# Patient Record
Sex: Male | Born: 1935 | Race: White | Hispanic: No | Marital: Married | Smoking: Former smoker
Health system: Southern US, Community
[De-identification: ages and names within clinical notes are randomized; demographics above are authoritative.]

## PROBLEM LIST (undated history)

## (undated) DIAGNOSIS — E785 Hyperlipidemia, unspecified: Secondary | ICD-10-CM

## (undated) DIAGNOSIS — G4733 Obstructive sleep apnea (adult) (pediatric): Secondary | ICD-10-CM

## (undated) DIAGNOSIS — I10 Essential (primary) hypertension: Secondary | ICD-10-CM

## (undated) DIAGNOSIS — F32A Depression, unspecified: Secondary | ICD-10-CM

## (undated) DIAGNOSIS — I471 Supraventricular tachycardia: Secondary | ICD-10-CM

## (undated) DIAGNOSIS — K5790 Diverticulosis of intestine, part unspecified, without perforation or abscess without bleeding: Secondary | ICD-10-CM

## (undated) DIAGNOSIS — IMO0002 Reserved for concepts with insufficient information to code with codable children: Secondary | ICD-10-CM

## (undated) DIAGNOSIS — G931 Anoxic brain damage, not elsewhere classified: Secondary | ICD-10-CM

## (undated) DIAGNOSIS — N200 Calculus of kidney: Secondary | ICD-10-CM

## (undated) DIAGNOSIS — I350 Nonrheumatic aortic (valve) stenosis: Secondary | ICD-10-CM

## (undated) DIAGNOSIS — K649 Unspecified hemorrhoids: Secondary | ICD-10-CM

## (undated) DIAGNOSIS — F0280 Dementia in other diseases classified elsewhere without behavioral disturbance: Secondary | ICD-10-CM

## (undated) DIAGNOSIS — Z952 Presence of prosthetic heart valve: Secondary | ICD-10-CM

## (undated) DIAGNOSIS — I4719 Other supraventricular tachycardia: Secondary | ICD-10-CM

## (undated) DIAGNOSIS — F419 Anxiety disorder, unspecified: Secondary | ICD-10-CM

## (undated) DIAGNOSIS — I452 Bifascicular block: Secondary | ICD-10-CM

## (undated) DIAGNOSIS — I2510 Atherosclerotic heart disease of native coronary artery without angina pectoris: Secondary | ICD-10-CM

## (undated) DIAGNOSIS — I442 Atrioventricular block, complete: Secondary | ICD-10-CM

## (undated) DIAGNOSIS — I4892 Unspecified atrial flutter: Secondary | ICD-10-CM

## (undated) DIAGNOSIS — F329 Major depressive disorder, single episode, unspecified: Secondary | ICD-10-CM

## (undated) DIAGNOSIS — M1990 Unspecified osteoarthritis, unspecified site: Secondary | ICD-10-CM

## (undated) DIAGNOSIS — E119 Type 2 diabetes mellitus without complications: Secondary | ICD-10-CM

## (undated) DIAGNOSIS — M48061 Spinal stenosis, lumbar region without neurogenic claudication: Secondary | ICD-10-CM

## (undated) HISTORY — DX: Bifascicular block: I45.2

## (undated) HISTORY — DX: Diverticulosis of intestine, part unspecified, without perforation or abscess without bleeding: K57.90

## (undated) HISTORY — DX: Atherosclerotic heart disease of native coronary artery without angina pectoris: I25.10

## (undated) HISTORY — DX: Hyperlipidemia, unspecified: E78.5

## (undated) HISTORY — DX: Nonrheumatic aortic (valve) stenosis: I35.0

## (undated) HISTORY — DX: Unspecified atrial flutter: I48.92

## (undated) HISTORY — PX: TONSILLECTOMY: SUR1361

## (undated) HISTORY — DX: Unspecified hemorrhoids: K64.9

## (undated) HISTORY — DX: Anoxic brain damage, not elsewhere classified: G93.1

## (undated) HISTORY — DX: Reserved for concepts with insufficient information to code with codable children: IMO0002

## (undated) HISTORY — DX: Spinal stenosis, lumbar region without neurogenic claudication: M48.061

## (undated) HISTORY — DX: Essential (primary) hypertension: I10

## (undated) HISTORY — DX: Presence of prosthetic heart valve: Z95.2

## (undated) HISTORY — DX: Other supraventricular tachycardia: I47.19

## (undated) HISTORY — PX: LEG SURGERY: SHX1003

## (undated) HISTORY — DX: Dementia in other diseases classified elsewhere without behavioral disturbance: F02.80

## (undated) HISTORY — DX: Type 2 diabetes mellitus without complications: E11.9

## (undated) HISTORY — PX: ANAL FISSURE REPAIR: SHX2312

## (undated) HISTORY — DX: Supraventricular tachycardia: I47.1

## (undated) HISTORY — PX: TRIGGER FINGER RELEASE: SHX641

## (undated) HISTORY — DX: Obstructive sleep apnea (adult) (pediatric): G47.33

## (undated) HISTORY — PX: CORONARY ARTERY BYPASS GRAFT: SHX141

## (undated) HISTORY — DX: Calculus of kidney: N20.0

## (undated) HISTORY — DX: Unspecified osteoarthritis, unspecified site: M19.90

---

## 1998-02-06 ENCOUNTER — Emergency Department (HOSPITAL_COMMUNITY): Admission: EM | Admit: 1998-02-06 | Discharge: 1998-02-06 | Payer: Self-pay | Admitting: Emergency Medicine

## 2001-06-22 HISTORY — PX: OTHER SURGICAL HISTORY: SHX169

## 2002-04-07 ENCOUNTER — Ambulatory Visit (HOSPITAL_COMMUNITY): Admission: RE | Admit: 2002-04-07 | Discharge: 2002-04-07 | Payer: Self-pay | Admitting: Cardiology

## 2002-04-11 ENCOUNTER — Encounter: Payer: Self-pay | Admitting: Surgery

## 2002-04-13 ENCOUNTER — Inpatient Hospital Stay (HOSPITAL_COMMUNITY): Admission: RE | Admit: 2002-04-13 | Discharge: 2002-04-21 | Payer: Self-pay | Admitting: Surgery

## 2002-04-13 ENCOUNTER — Encounter: Payer: Self-pay | Admitting: Surgery

## 2002-04-14 ENCOUNTER — Encounter: Payer: Self-pay | Admitting: Surgery

## 2002-04-15 ENCOUNTER — Encounter: Payer: Self-pay | Admitting: Cardiothoracic Surgery

## 2002-04-17 ENCOUNTER — Encounter: Payer: Self-pay | Admitting: Cardiothoracic Surgery

## 2002-06-05 ENCOUNTER — Encounter (HOSPITAL_COMMUNITY): Admission: RE | Admit: 2002-06-05 | Discharge: 2002-09-03 | Payer: Self-pay | Admitting: Cardiology

## 2003-08-29 ENCOUNTER — Encounter: Admission: RE | Admit: 2003-08-29 | Discharge: 2003-08-29 | Payer: Self-pay | Admitting: Internal Medicine

## 2003-09-01 ENCOUNTER — Encounter: Admission: RE | Admit: 2003-09-01 | Discharge: 2003-09-01 | Payer: Self-pay | Admitting: Internal Medicine

## 2003-09-03 ENCOUNTER — Encounter: Admission: RE | Admit: 2003-09-03 | Discharge: 2003-09-03 | Payer: Self-pay | Admitting: Internal Medicine

## 2003-09-13 ENCOUNTER — Encounter: Admission: RE | Admit: 2003-09-13 | Discharge: 2003-09-13 | Payer: Self-pay | Admitting: Internal Medicine

## 2003-10-01 ENCOUNTER — Encounter: Admission: RE | Admit: 2003-10-01 | Discharge: 2003-10-01 | Payer: Self-pay | Admitting: Internal Medicine

## 2003-12-10 ENCOUNTER — Encounter: Admission: RE | Admit: 2003-12-10 | Discharge: 2003-12-10 | Payer: Self-pay | Admitting: Internal Medicine

## 2006-09-17 ENCOUNTER — Ambulatory Visit: Payer: Self-pay | Admitting: Vascular Surgery

## 2006-10-08 ENCOUNTER — Ambulatory Visit: Payer: Self-pay | Admitting: Vascular Surgery

## 2007-01-21 ENCOUNTER — Ambulatory Visit: Payer: Self-pay | Admitting: Vascular Surgery

## 2007-04-06 ENCOUNTER — Encounter: Payer: Self-pay | Admitting: Pulmonary Disease

## 2007-10-12 ENCOUNTER — Encounter: Payer: Self-pay | Admitting: Pulmonary Disease

## 2007-10-14 ENCOUNTER — Encounter: Payer: Self-pay | Admitting: Pulmonary Disease

## 2007-10-19 ENCOUNTER — Ambulatory Visit (HOSPITAL_COMMUNITY): Admission: RE | Admit: 2007-10-19 | Discharge: 2007-10-19 | Payer: Self-pay | Admitting: Cardiology

## 2007-10-19 ENCOUNTER — Encounter: Payer: Self-pay | Admitting: Pulmonary Disease

## 2007-11-16 ENCOUNTER — Ambulatory Visit: Payer: Self-pay | Admitting: Pulmonary Disease

## 2007-11-16 DIAGNOSIS — E785 Hyperlipidemia, unspecified: Secondary | ICD-10-CM

## 2007-11-16 DIAGNOSIS — I10 Essential (primary) hypertension: Secondary | ICD-10-CM

## 2007-11-16 DIAGNOSIS — J449 Chronic obstructive pulmonary disease, unspecified: Secondary | ICD-10-CM | POA: Insufficient documentation

## 2007-11-16 DIAGNOSIS — E119 Type 2 diabetes mellitus without complications: Secondary | ICD-10-CM | POA: Insufficient documentation

## 2007-12-25 DIAGNOSIS — R0602 Shortness of breath: Secondary | ICD-10-CM

## 2008-02-06 ENCOUNTER — Encounter: Admission: RE | Admit: 2008-02-06 | Discharge: 2008-02-06 | Payer: Self-pay | Admitting: Orthopedic Surgery

## 2008-02-07 ENCOUNTER — Ambulatory Visit (HOSPITAL_BASED_OUTPATIENT_CLINIC_OR_DEPARTMENT_OTHER): Admission: RE | Admit: 2008-02-07 | Discharge: 2008-02-07 | Payer: Self-pay | Admitting: Orthopedic Surgery

## 2008-02-07 ENCOUNTER — Encounter (INDEPENDENT_AMBULATORY_CARE_PROVIDER_SITE_OTHER): Payer: Self-pay | Admitting: Orthopedic Surgery

## 2008-02-08 ENCOUNTER — Ambulatory Visit: Payer: Self-pay | Admitting: Vascular Surgery

## 2008-03-12 ENCOUNTER — Emergency Department (HOSPITAL_BASED_OUTPATIENT_CLINIC_OR_DEPARTMENT_OTHER): Admission: EM | Admit: 2008-03-12 | Discharge: 2008-03-13 | Payer: Self-pay | Admitting: Emergency Medicine

## 2008-06-22 HISTORY — PX: BACK SURGERY: SHX140

## 2008-10-18 ENCOUNTER — Encounter: Admission: RE | Admit: 2008-10-18 | Discharge: 2008-10-18 | Payer: Self-pay | Admitting: Internal Medicine

## 2009-03-19 ENCOUNTER — Ambulatory Visit (HOSPITAL_BASED_OUTPATIENT_CLINIC_OR_DEPARTMENT_OTHER): Admission: RE | Admit: 2009-03-19 | Discharge: 2009-03-19 | Payer: Self-pay | Admitting: Orthopedic Surgery

## 2010-02-14 ENCOUNTER — Ambulatory Visit: Payer: Self-pay | Admitting: Cardiology

## 2010-02-18 ENCOUNTER — Ambulatory Visit: Payer: Self-pay | Admitting: Cardiology

## 2010-02-19 ENCOUNTER — Ambulatory Visit (HOSPITAL_COMMUNITY): Admission: RE | Admit: 2010-02-19 | Discharge: 2010-02-19 | Payer: Self-pay | Admitting: Cardiology

## 2010-02-19 ENCOUNTER — Ambulatory Visit: Payer: Self-pay | Admitting: Cardiology

## 2010-02-26 ENCOUNTER — Ambulatory Visit: Payer: Self-pay | Admitting: Cardiology

## 2010-03-19 ENCOUNTER — Ambulatory Visit: Payer: Self-pay | Admitting: Cardiology

## 2010-06-03 ENCOUNTER — Ambulatory Visit: Payer: Self-pay | Admitting: Cardiology

## 2010-06-04 ENCOUNTER — Ambulatory Visit: Payer: Self-pay | Admitting: Cardiology

## 2010-06-22 HISTORY — PX: ATRIAL ABLATION SURGERY: SHX560

## 2010-07-02 ENCOUNTER — Inpatient Hospital Stay (HOSPITAL_COMMUNITY)
Admission: EM | Admit: 2010-07-02 | Discharge: 2010-07-08 | Payer: Self-pay | Source: Home / Self Care | Attending: Cardiology | Admitting: Cardiology

## 2010-07-04 ENCOUNTER — Encounter: Payer: Self-pay | Admitting: Internal Medicine

## 2010-07-07 LAB — TROPONIN I
Troponin I: 0.03 ng/mL (ref 0.00–0.06)
Troponin I: 0.03 ng/mL (ref 0.00–0.06)

## 2010-07-07 LAB — CK TOTAL AND CKMB (NOT AT ARMC)
CK, MB: 12.3 ng/mL (ref 0.3–4.0)
CK, MB: 8.7 ng/mL (ref 0.3–4.0)
Relative Index: 5 — ABNORMAL HIGH (ref 0.0–2.5)
Relative Index: 5 — ABNORMAL HIGH (ref 0.0–2.5)
Total CK: 245 U/L — ABNORMAL HIGH (ref 7–232)
Total CK: 175 U/L (ref 7–232)

## 2010-07-07 LAB — URINALYSIS, ROUTINE W REFLEX MICROSCOPIC
Bilirubin Urine: NEGATIVE
Hgb urine dipstick: NEGATIVE
Ketones, ur: NEGATIVE mg/dL
Leukocytes, UA: NEGATIVE
Nitrite: NEGATIVE
Protein, ur: 30 mg/dL — AB
Specific Gravity, Urine: 1.02 (ref 1.005–1.030)
Urine Glucose, Fasting: 100 mg/dL — AB
Urobilinogen, UA: 1 mg/dL (ref 0.0–1.0)
pH: 5.5 (ref 5.0–8.0)

## 2010-07-07 LAB — BASIC METABOLIC PANEL
BUN: 11 mg/dL (ref 6–23)
CO2: 26 mEq/L (ref 19–32)
Calcium: 9.4 mg/dL (ref 8.4–10.5)
Chloride: 105 mEq/L (ref 96–112)
Creatinine, Ser: 0.76 mg/dL (ref 0.4–1.5)
GFR calc Af Amer: >60 mL/min (ref >60)
GFR calc non Af Amer: >60 mL/min (ref >60)
Glucose, Bld: 105 mg/dL — ABNORMAL HIGH (ref 70–99)
Potassium: 3.9 mEq/L (ref 3.5–5.1)
Sodium: 142 mEq/L (ref 135–145)

## 2010-07-07 LAB — CBC
HCT: 41 % (ref 39.0–52.0)
HCT: 38.3 % — ABNORMAL LOW (ref 39.0–52.0)
HCT: 41.1 % (ref 39.0–52.0)
HCT: 37.4 % — ABNORMAL LOW (ref 39.0–52.0)
Hemoglobin: 14.1 g/dL (ref 13.0–17.0)
Hemoglobin: 12.8 g/dL — ABNORMAL LOW (ref 13.0–17.0)
Hemoglobin: 13.4 g/dL (ref 13.0–17.0)
Hemoglobin: 12 g/dL — ABNORMAL LOW (ref 13.0–17.0)
MCH: 30.3 pg (ref 26.0–34.0)
MCH: 29.2 pg (ref 26.0–34.0)
MCH: 29.1 pg (ref 26.0–34.0)
MCH: 28.1 pg (ref 26.0–34.0)
MCHC: 34.4 g/dL (ref 30.0–36.0)
MCHC: 33.4 g/dL (ref 30.0–36.0)
MCHC: 32.6 g/dL (ref 30.0–36.0)
MCHC: 32.1 g/dL (ref 30.0–36.0)
MCV: 88.2 fL (ref 78.0–100.0)
MCV: 87.2 fL (ref 78.0–100.0)
MCV: 89.3 fL (ref 78.0–100.0)
MCV: 87.6 fL (ref 78.0–100.0)
Platelets: 356 10*3/uL (ref 150–400)
Platelets: 326 10*3/uL (ref 150–400)
Platelets: 321 10*3/uL (ref 150–400)
Platelets: 297 10*3/uL (ref 150–400)
RBC: 4.65 MIL/uL (ref 4.22–5.81)
RBC: 4.39 MIL/uL (ref 4.22–5.81)
RBC: 4.6 MIL/uL (ref 4.22–5.81)
RBC: 4.27 MIL/uL (ref 4.22–5.81)
RDW: 13.9 % (ref 11.5–15.5)
RDW: 13.8 % (ref 11.5–15.5)
RDW: 14.1 % (ref 11.5–15.5)
RDW: 14 % (ref 11.5–15.5)
WBC: 11.2 10*3/uL — ABNORMAL HIGH (ref 4.0–10.5)
WBC: 11.9 10*3/uL — ABNORMAL HIGH (ref 4.0–10.5)
WBC: 10.8 10*3/uL — ABNORMAL HIGH (ref 4.0–10.5)
WBC: 10.1 10*3/uL (ref 4.0–10.5)

## 2010-07-07 LAB — DIFFERENTIAL
Basophils Absolute: 0.1 10*3/uL (ref 0.0–0.1)
Basophils Relative: 1 % (ref 0–1)
Eosinophils Absolute: 0.2 10*3/uL (ref 0.0–0.7)
Eosinophils Relative: 2 % (ref 0–5)
Lymphocytes Relative: 26 % (ref 12–46)
Lymphs Abs: 3 10*3/uL (ref 0.7–4.0)
Monocytes Absolute: 0.8 10*3/uL (ref 0.1–1.0)
Monocytes Relative: 7 % (ref 3–12)
Neutro Abs: 7.2 10*3/uL (ref 1.7–7.7)
Neutrophils Relative %: 64 % (ref 43–77)

## 2010-07-07 LAB — HEPARIN LEVEL (UNFRACTIONATED)
Heparin Unfractionated: 0.13 IU/mL — ABNORMAL LOW (ref 0.30–0.70)
Heparin Unfractionated: 0.1 IU/mL — ABNORMAL LOW (ref 0.30–0.70)
Heparin Unfractionated: 0.18 IU/mL — ABNORMAL LOW (ref 0.30–0.70)
Heparin Unfractionated: 0.5 IU/mL (ref 0.30–0.70)
Heparin Unfractionated: 0.24 IU/mL — ABNORMAL LOW (ref 0.30–0.70)
Heparin Unfractionated: 0.24 IU/mL — ABNORMAL LOW (ref 0.30–0.70)

## 2010-07-07 LAB — GLUCOSE, CAPILLARY
Glucose-Capillary: 130 mg/dL — ABNORMAL HIGH (ref 70–99)
Glucose-Capillary: 171 mg/dL — ABNORMAL HIGH (ref 70–99)
Glucose-Capillary: 80 mg/dL (ref 70–99)
Glucose-Capillary: 127 mg/dL — ABNORMAL HIGH (ref 70–99)
Glucose-Capillary: 182 mg/dL — ABNORMAL HIGH (ref 70–99)
Glucose-Capillary: 200 mg/dL — ABNORMAL HIGH (ref 70–99)
Glucose-Capillary: 154 mg/dL — ABNORMAL HIGH (ref 70–99)
Glucose-Capillary: 156 mg/dL — ABNORMAL HIGH (ref 70–99)
Glucose-Capillary: 132 mg/dL — ABNORMAL HIGH (ref 70–99)
Glucose-Capillary: 196 mg/dL — ABNORMAL HIGH (ref 70–99)
Glucose-Capillary: 191 mg/dL — ABNORMAL HIGH (ref 70–99)
Glucose-Capillary: 112 mg/dL — ABNORMAL HIGH (ref 70–99)
Glucose-Capillary: 73 mg/dL (ref 70–99)
Glucose-Capillary: 154 mg/dL — ABNORMAL HIGH (ref 70–99)
Glucose-Capillary: 133 mg/dL — ABNORMAL HIGH (ref 70–99)
Glucose-Capillary: 144 mg/dL — ABNORMAL HIGH (ref 70–99)
Glucose-Capillary: 91 mg/dL (ref 70–99)
Glucose-Capillary: 136 mg/dL — ABNORMAL HIGH (ref 70–99)

## 2010-07-07 LAB — PROTIME-INR
INR: 1.01 (ref 0.00–1.49)
INR: 1.03 (ref 0.00–1.49)
INR: 1.08 (ref 0.00–1.49)
INR: 1.2 (ref 0.00–1.49)
Prothrombin Time: 13.5 seconds (ref 11.6–15.2)
Prothrombin Time: 13.7 seconds (ref 11.6–15.2)
Prothrombin Time: 14.2 seconds (ref 11.6–15.2)
Prothrombin Time: 15.4 seconds — ABNORMAL HIGH (ref 11.6–15.2)

## 2010-07-07 LAB — POCT CARDIAC MARKERS
CKMB, poc: 10.3 ng/mL (ref 1.0–8.0)
Myoglobin, poc: 154 ng/mL (ref 12–200)
Troponin i, poc: <0.05 ng/mL (ref 0.00–0.09)

## 2010-07-07 LAB — URINE MICROSCOPIC-ADD ON

## 2010-07-07 LAB — BRAIN NATRIURETIC PEPTIDE: Pro B Natriuretic peptide (BNP): 107 pg/mL — ABNORMAL HIGH (ref 0.0–100.0)

## 2010-07-07 LAB — DIGOXIN LEVEL: Digoxin Level: <0.2 ng/mL — ABNORMAL LOW (ref 0.8–2.0)

## 2010-07-07 LAB — THEOPHYLLINE LEVEL: Theophylline Lvl: 10.8 ug/mL (ref 10.0–20.0)

## 2010-07-09 LAB — PROTIME-INR
INR: 1.77 — ABNORMAL HIGH (ref 0.00–1.49)
INR: 2.06 — ABNORMAL HIGH (ref 0.00–1.49)
Prothrombin Time: 20.8 seconds — ABNORMAL HIGH (ref 11.6–15.2)
Prothrombin Time: 23.4 seconds — ABNORMAL HIGH (ref 11.6–15.2)

## 2010-07-09 LAB — CBC
HCT: 39 % (ref 39.0–52.0)
HCT: 36.9 % — ABNORMAL LOW (ref 39.0–52.0)
Hemoglobin: 12.9 g/dL — ABNORMAL LOW (ref 13.0–17.0)
Hemoglobin: 11.7 g/dL — ABNORMAL LOW (ref 13.0–17.0)
MCH: 29.1 pg (ref 26.0–34.0)
MCH: 27.8 pg (ref 26.0–34.0)
MCHC: 33.1 g/dL (ref 30.0–36.0)
MCHC: 31.7 g/dL (ref 30.0–36.0)
MCV: 87.8 fL (ref 78.0–100.0)
MCV: 87.6 fL (ref 78.0–100.0)
Platelets: 291 10*3/uL (ref 150–400)
Platelets: 299 10*3/uL (ref 150–400)
RBC: 4.44 MIL/uL (ref 4.22–5.81)
RBC: 4.21 MIL/uL — ABNORMAL LOW (ref 4.22–5.81)
RDW: 14 % (ref 11.5–15.5)
RDW: 13.8 % (ref 11.5–15.5)
WBC: 10.7 10*3/uL — ABNORMAL HIGH (ref 4.0–10.5)
WBC: 10 10*3/uL (ref 4.0–10.5)

## 2010-07-09 LAB — GLUCOSE, CAPILLARY
Glucose-Capillary: 143 mg/dL — ABNORMAL HIGH (ref 70–99)
Glucose-Capillary: 140 mg/dL — ABNORMAL HIGH (ref 70–99)
Glucose-Capillary: 140 mg/dL — ABNORMAL HIGH (ref 70–99)
Glucose-Capillary: 71 mg/dL (ref 70–99)
Glucose-Capillary: 145 mg/dL — ABNORMAL HIGH (ref 70–99)

## 2010-07-09 LAB — HEPARIN LEVEL (UNFRACTIONATED)
Heparin Unfractionated: 0.18 IU/mL — ABNORMAL LOW (ref 0.30–0.70)
Heparin Unfractionated: 0.28 IU/mL — ABNORMAL LOW (ref 0.30–0.70)
Heparin Unfractionated: 0.26 IU/mL — ABNORMAL LOW (ref 0.30–0.70)

## 2010-07-09 LAB — DIGOXIN LEVEL: Digoxin Level: <0.2 ng/mL — ABNORMAL LOW (ref 0.8–2.0)

## 2010-07-11 ENCOUNTER — Ambulatory Visit: Payer: Self-pay | Admitting: Cardiology

## 2010-07-13 ENCOUNTER — Encounter (HOSPITAL_BASED_OUTPATIENT_CLINIC_OR_DEPARTMENT_OTHER): Payer: Self-pay | Admitting: Internal Medicine

## 2010-07-15 ENCOUNTER — Ambulatory Visit: Payer: Self-pay | Admitting: Cardiology

## 2010-07-17 NOTE — Discharge Summary (Addendum)
NAMEALDRIN, ENGELHARD NO.:  [id number]  MEDICAL RECORD NO.:  [id number]          PATIENT TYPE:  INP  LOCATION:  2012                         FACILITY:  [facility]  PHYSICIAN:  Colleen Can. Deborah Chalk, M.D.DATE OF BIRTH:  18-Mar-1935  DATE OF ADMISSION:  07/02/2010 DATE OF DISCHARGE:  07/08/2010                              DISCHARGE SUMMARY   PROCEDURES: 1. Electrophysiology study. 2. Radiofrequency catheter ablation of atrial flutter. 3. Transesophageal echocardiogram. 4. Portable chest x-ray.  PRIMARY FINAL DISCHARGE DIAGNOSIS:  Atrial flutter.  SECONDARY DIAGNOSES: 1. Anticoagulation with Coumadin begun this admission. 2. Status post aortocoronary bypass surgery in 2003 with left internal     mammary artery to left anterior descending, saphenous vein graft to     obtuse marginal, saphenous vein graft to posterior descending     artery and posterolateral, saphenous vein graft to acute marginal. 3. History of preserved left ventricular function with mild aortic     stenosis, ejection fraction 50-55% and a mean aortic valve gradient     12 mmHg by echocardiogram this admission. 4. Diabetes. 5. History of tobacco abuse, quit in 2003. 6. Hypertension. 7. Hyperlipidemia. 8. Family history of coronary artery disease in his father. 9. History of nephrolithiasis. 10.Postoperative atrial fibrillation at the time of his bypass     surgery. 11.Status post minimally invasive back surgery in October 2010 as well     as rectal fissure repair, remotely. 12.History of spinal stenosis.  TIME AT DISCHARGE:  42 minutes.  HOSPITAL COURSE:  Mr. Ybanez is a 76 year old male with a history of coronary artery disease.  He went to his family physician and was noted to be in atrial flutter.  He was admitted to the hospital for further evaluation and treatment.  He was started on IV Cardizem and heparin was transitioned to Coumadin as his CHADS score was 3.  An EP evaluation was  called and he was seen by Dr. Ladona Ridgel.  Dr. Ladona Ridgel recommended ablation but because the patient had not been anticoagulated, a TEE was indicated first.  The TEE showed no left atrial appendage thrombus.  He then had successful electrophysiology study and ablation with bidirectional block.  The patient tolerated the procedure well but was difficult to sedate. Postprocedure, he had some tachycardia that was felt to be in atrial flutter.  He was started on IV amiodarone and dig in addition to the Cardizem and metoprolol he was taking.  His heart rate improved and his vital signs were stable.  His INR trended up.  He was seen by physical therapy and evaluated, but it was felt that he would benefit from exercise although no physical therapy followup was indicated.  By July 08, 2010, Mr. Uttech's INR was therapeutic at 2.06.  He was mildly anemic with a hemoglobin of 11.7 and hematocrit of 36.9.  A dig level was checked but was subtherapeutic at 0.02.  However, he has only good on digi a short while and this can be followed as an outpatient. He was evaluated by Dr. Deborah Chalk on July 08, 2010, and considered stable for discharge, to follow up as an outpatient.  DISCHARGE  INSTRUCTIONS: 1. His activity levels to be increased gradually.  He is to call our     office for problems with the flutter or incision sites.  He is to     follow up with Kathi Ludwig for Dr. Deborah Chalk on January 31 at 1:30.     He is to follow up with Dr. Jarold Motto and Dr. Darrick Penna as needed or     as scheduled.  He is to get an INR at [facility] on     January 18 at 10:30.  DISCHARGE MEDICATIONS: 1. Cozaar 100 mg daily. 2. Venlafaxine 100 mg daily 3. Lopressor at a mg b.i.d. 4. Metformin 500 mg 2 capsules nightly. 5. Cardizem CD 240 mg daily. 6. Digoxin 0.125 mg 1 tablet b.i.d. 7. Soma 350 mg nightly. 8. Zocor 10 mg nightly. 9. Levemir 34 units b.i.d. 10.NovoLog 26 units daily. 11.Pletal 50 mg  b.i.d. 12.Melatonin 3 mg nightly. 13.Potassium chloride 10 mEq daily. 14.Theophylline is discontinued. 15.Flomax 0.4 mg daily. 16.HCTZ 25 mg daily. 17.Coumadin 5 mg, 1-2 tablets daily or as directed, 2 tablets today.     Theodore Demark, PA-C   ______________________________ Colleen Can. Deborah Chalk, M.D.    RB/MEDQ  D:  07/08/2010  T:  07/08/2010  Job:  [id number]  cc:   Dr. Jarold Motto Dr. Darrick Penna  Electronically Signed by Theodore Demark PA-C on 07/14/2010 05:14:01 PM Electronically Signed by Roger Shelter M.D. on 07/17/2010 03:37:39 PM

## 2010-07-17 NOTE — Discharge Summary (Addendum)
  NAMETAVORIS, BRISK NO.:  [id number]  MEDICAL RECORD NO.:  [id number]          PATIENT TYPE:  INP  LOCATION:  2012                         FACILITY:  [facility]  PHYSICIAN:  Colleen Can. Deborah Chalk, M.D.DATE OF BIRTH:  Mar 20, 1935  DATE OF ADMISSION:  07/02/2010 DATE OF DISCHARGE:  07/08/2010                              DISCHARGE SUMMARY   ADDENDUM  Amiodarone 200 mg daily was inadvertently left off the patient's med list.  He was given a prescription for this and all other medications and discharge plans are unchanged.     Theodore Demark, PA-C   ______________________________ Colleen Can. Deborah Chalk, M.D.    RB/MEDQ  D:  07/08/2010  T:  07/08/2010  Job:  [id number]  Electronically Signed by Theodore Demark PA-C on 07/14/2010 05:14:17 PM Electronically Signed by Roger Shelter M.D. on 07/17/2010 03:37:50 PM

## 2010-07-17 NOTE — H&P (Addendum)
Adam [name], Adam [name] NO.:  [id number]  MEDICAL RECORD NO.:  [id number]          PATIENT TYPE:  EMS  LOCATION:  [facility]                         FACILITY:  [facility]  PHYSICIAN:  Adam [name], M.D.DATE OF BIRTH:  19-Jun-1935  DATE OF ADMISSION:  07/02/2010 DATE OF DISCHARGE:                             HISTORY & PHYSICAL   HISTORY:  Adam [name] will went to his family physician for evaluation of his diabetes today.  During that visit, he was noted to the mid atrial flutter with a rapid ventricular response.  He has been on multiple meds to control his response and possible recurrence of atrial fibrillation. He previously was on Pradaxa but had stopped that after a period of time with no recurrence.  He currently does not have any chest pain, shortness of breath or significant symptoms otherwise.  He does have known moderate aortic stenosis with an aortic valve area 1.4 cm.  He had coronary artery bypass grafting in October 2003 with a LIMA graft to the LAD, saphenous vein graft to the acute marginal, saphenous vein graft posterior descending, and saphenous vein graft to the obtuse marginal. In light of these findings, he is admitted now for evaluation and reestablishing anticoagulation as well as maintenance of  sinus rhythm. He has an increased [place] 2 score, and will need to be on chronic Coumadin anticoagulation.  PAST MEDICAL HISTORY:  His past medical history is remarkable for known coronary disease with grafts as noted above.  His last stress Cardiolite study was October 2008.  His last echocardiogram was in August 2011 with ejection fraction 60%, with aortic valve area 1.4 cm2.  He had minimally invasive disk surgery on his lumbar spine at [facility] October 2010.  With this, symptoms of claudication resolved for approximately 2 months, but then recurred, and the discomfort is somewhat limited in addition to spinal stenosis.  He sees Dr. Brunilda Payor  for general medical care.  He had: 1. Back surgery in October 2010. 2. Bypass surgery 2003. 3. Rectal fissure in the 1960s.  He has a history of: 1. Diabetes. 2. Hypercholesterolemia and 3. Hypertension.  FAMILY HISTORY:  Father died in 17 of stroke.  Mother died at age 30 a stroke.  SOCIAL HISTORY:  He is a retired Teacher, early years/pre.  He rarely drinks alcohol. He is married.  He stopped smoking in 2003.  MEDICATIONS: 1. Lanoxin 0.125 mg per day. 2. Metoprolol 50 mg 2 tablets the morning and 1 at night. 3. Pletal 50 mg b.i.d. 4. NovoLog 26 units three times a day. 5. Levemir 34 units twice a day. 6. Venlafaxine 100 mg 1 tablet a day. 7. Hydrochlorothiazide 25 mg one each day. 8. Cardizem CD 240 each day. 9. Cozaar 100 mg per day. 10.K-clor 10 mEq per day. 11.Lanoxin 0.125 mg per day. 12.Metformin 1000 mg h.s. 13.Theophylline CR 200 mg 1 and 1/2 tablets by mouth twice a day. 14 .  Zocor 40 mg h.s. 1. Melatonin 3 mg h.s. 2. Carisoprodol 350 mg h.s. p.r.n. leg cramps. 3. Flomax 0.4 mg per day,  REVIEW OF SYSTEMS:  Negative otherwise, with all others being  negative.  PHYSICAL EXAMINATION:  GENERAL:  On exam in general he is obese.  He is pleasant.  No acute distress. HEENT:  He is normocephalic and atraumatic.  Pupils equally round, reactive to light.  Sclerae is nonicteric.  Conjunctivae is clear. NECK:  Supple.  No masses. LUNGS:  Are clear. HEART:  Shows a tachycardia with irregular rhythm.  There is a grade 3/6 systolic outflow murmur at the base. ABDOMEN:  Soft and obese without organomegaly. GENITAL/RECTAL:  Not performed. EXTREMITIES:  Without edema.  Peripheral pulses are adequate. NEURO:  Neurologically he is intact.  Gait was not checked.  ELECTROCARDIOGRAM:  EKG shows atrial flutter with a history shows atrial flutter with a right bundle branch block and left anterior hemiblock.  IMPRESSION:  Overall impression: 1. Atrial flutter with rapid ventricular  response. 2. Previous coronary artery bypass grafting. 3. History of Theophylline use for chronic obstructive pulmonary     disease and bronchospasm. 4. Diabetes mellitus. 5. Obesity. 6. Hypertension. 7. Hypercholesterolemia.  PLAN:  The patient will be started on IV heparin.  We will continue his current home medicines but had IV Cardizem.  He may be a candidate for TEE cardioversion versus the possibility of atrial flutter ablation. This is complicated somewhat by him not being on long-term anticoagulation.  He will need to be on long-term anticoagulation given this recurrence of atrial flutter.  Currently he is stable; will  be admitted to telemetry for observation.     Adam [name], M.D.     SNT/MEDQ  D:  07/02/2010  T:  07/02/2010  Job:  [id number]  Electronically Signed by Roger Shelter M.D. on 07/17/2010 03:37:36 PM

## 2010-07-21 ENCOUNTER — Ambulatory Visit: Payer: Self-pay | Admitting: Cardiology

## 2010-07-22 ENCOUNTER — Ambulatory Visit: Payer: Self-pay | Admitting: Cardiology

## 2010-07-29 ENCOUNTER — Encounter (INDEPENDENT_AMBULATORY_CARE_PROVIDER_SITE_OTHER): Payer: Medicare Other

## 2010-07-29 DIAGNOSIS — Z7901 Long term (current) use of anticoagulants: Secondary | ICD-10-CM

## 2010-07-29 DIAGNOSIS — I359 Nonrheumatic aortic valve disorder, unspecified: Secondary | ICD-10-CM

## 2010-07-31 NOTE — Op Note (Signed)
NAMEDAITON, COWLES NO.:  [id number]  MEDICAL RECORD NO.:  [id number]          PATIENT TYPE:  INP  LOCATION:  2012                         FACILITY:  [facility]  PHYSICIAN:  Doylene Canning. Ladona Ridgel, MD    DATE OF BIRTH:  [birth year]  DATE OF PROCEDURE:  07/04/2010 DATE OF DISCHARGE:                              OPERATIVE REPORT   PROCEDURE PERFORMED:  Electrophysiologic study and RF catheter ablation of atrial flutter.  INTRODUCTION:  The patient is a very pleasant 76 year old male with a history of coronary artery disease status post bypass surgery, history of hypertension who was found to be in atrial flutter with a rapid ventricular response.  He was begun on heparin and Coumadin and is now referred for electrophysiologic study and catheter ablation.  The patient has undergone transesophageal echo, which demonstrated no left atrial appendage thrombus.  PROCEDURE:  After informed consent was obtained, the patient was taken to the diagnostic EP lab in a fasting state.  After usual preparation and draping, intravenous fentanyl and midazolam was given for sedation. A 6-French hexapolar catheter was inserted percutaneously into the right jugular vein and advanced to the coronary sinus.  A 7-French 20-pole Halo catheter was inserted percutaneously into the right femoral vein and advanced to the right atrium.  A 6-French quadripolar catheter was inserted percutaneously into the right femoral vein and advanced to the His bundle region.  Mapping was subsequently carried out.  This demonstrated typical counterclockwise tricuspid annular reentrant atrial flutter with a cycle length of 225 milliseconds.  A 7-French quadripolar ablation catheter was sharply maneuvered by way of the right femoral vein under fluoroscopic guidance into the right atrium.  Additional mapping was carried out.  The tricuspid valve annulus and atrial flutter isthmus were of usual size and orientation.  A  total of 19 RF energy applications were delivered.  RF energy application was cut short and pulses of RF were delivered as the patient was difficult to sedate and was moved quite violently during RF energy application so that only short pulses of RF could be delivered.  During RF energy application, atrial flutter slowed and then terminated restoring sinus rhythm.  At this point, atrial flutter isthmus conduction was still present and additional RF energy application was delivered resulting in the creation of bidirectional atrial flutter isthmus block.  At this point, the ablation catheter was maneuvered into the right ventricle and rapid ventricular pacing was carried out from the right ventricle demonstrating VA dissociation at 600 milliseconds.  Also, programmed ventricular stimulation was carried out demonstrating VA dissociation at 600 milliseconds.  Rapid atrial pacing was then carried out from the coronary sinus in the right atrium, which demonstrated an AV Wenckebach cycle length of 520 milliseconds.  During rapid atrial pacing, the PR interval was less than the RR interval and there was no inducible SVT. Next, programmed atrial stimulation was carried out from the coronary sinus in the high right atrium at a base drive cycle length of 478 milliseconds.  The S1 and S2 interval was stepwise decreased down to 420 milliseconds where the AV node ERP was observed.  During programmed  atrial stimulation, there were no AH jumps, no echo beats, and no inducible SVT.  After approximately 30 minutes of observation with no recurrent atrial flutter isthmus conduction, the catheters were removed, hemostasis was assured, and the patient was returned to his room in satisfactory condition.  COMPLICATIONS:  There were no immediate procedure complications.  RESULTS:  a.  Baseline ECG.  Baseline ECG demonstrates atrial flutter with a cycle length of 225 milliseconds and a QRS duration of  138 milliseconds. b.  Rapid ventricular pacing.  Rapid ventricular pacing was carried out from the right ventricle following ablation demonstrating VA dissociation. c.  Programmed ventricular stimulation.  Programmed ventricular stimulation was carried out from the right ventricle following return of sinus rhythm demonstrating VA dissociation. d.  Programmed ventricular stimulation.  Programmed ventricular stimulation was carried out from the right ventricle demonstrating VA dissociation at 600 milliseconds. e.  Rapid atrial pacing.  Following ablation, rapid atrial pacing was carried out from the right atrium coronary sinus demonstrating AV Wenckebach cycle length of 520 milliseconds. f.  Programmed atrial stimulation.  Programmed atrial stimulation was carried out from the coronary sinus to the right atrium at a base drive cycle length of 811 milliseconds.  The S1 and S2 interval was stepwise decreased down to 420 milliseconds where the AV node ERP was observed. During programmed atrial stimulation, there were no AH jumps, no echo beats, and no inducible SVT. g.  Arrhythmia observed. 1. Atrial flutter initiation present at the time of EP study, duration     sustained, cycle length was 225 milliseconds.  Method of     termination was with catheter ablation.     a.     Mapping.  Mapping demonstrated typical counterclockwise      tricuspid annular reentrant atrial flutter.     b.     RF energy application.  A total of 19 RF energy applications      (short) were delivered to the usual atrial flutter isthmus      resulting in termination of atrial flutter, restoration of sinus      rhythm, creation of bidirectional block in the atrial flutter      isthmus.  CONCLUSION:  Study demonstrates successful electrophysiologic study and RF catheter ablation of typical atrial flutter with a total of 19 RF energy applications delivered to the usual atrial flutter isthmus resulting in termination  of flutter, restoration of sinus rhythm, and creation of bidirectional block and atrial flutter isthmus.     Doylene Canning. Ladona Ridgel, MD     GWT/MEDQ  D:  07/04/2010  T:  07/05/2010  Job:  [id number]  cc:   Colleen Can. Deborah Chalk, M.D.  Electronically Signed by Lewayne Bunting MD on 07/31/2010 05:14:26 PM

## 2010-08-05 ENCOUNTER — Encounter (INDEPENDENT_AMBULATORY_CARE_PROVIDER_SITE_OTHER): Payer: Medicare Other

## 2010-08-05 DIAGNOSIS — I359 Nonrheumatic aortic valve disorder, unspecified: Secondary | ICD-10-CM

## 2010-08-05 DIAGNOSIS — Z7901 Long term (current) use of anticoagulants: Secondary | ICD-10-CM

## 2010-08-19 ENCOUNTER — Ambulatory Visit (INDEPENDENT_AMBULATORY_CARE_PROVIDER_SITE_OTHER): Payer: Medicare Other | Admitting: Nurse Practitioner

## 2010-08-19 DIAGNOSIS — I4892 Unspecified atrial flutter: Secondary | ICD-10-CM

## 2010-08-19 DIAGNOSIS — Z7901 Long term (current) use of anticoagulants: Secondary | ICD-10-CM

## 2010-08-19 DIAGNOSIS — I10 Essential (primary) hypertension: Secondary | ICD-10-CM

## 2010-09-02 ENCOUNTER — Encounter (INDEPENDENT_AMBULATORY_CARE_PROVIDER_SITE_OTHER): Payer: Medicare Other

## 2010-09-02 DIAGNOSIS — I4891 Unspecified atrial fibrillation: Secondary | ICD-10-CM

## 2010-09-02 DIAGNOSIS — Z7901 Long term (current) use of anticoagulants: Secondary | ICD-10-CM

## 2010-09-26 LAB — BASIC METABOLIC PANEL
BUN: 8 mg/dL (ref 6–23)
CO2: 31 mEq/L (ref 19–32)
Calcium: 9.3 mg/dL (ref 8.4–10.5)
Chloride: 100 mEq/L (ref 96–112)
Creatinine, Ser: 0.7 mg/dL (ref 0.4–1.5)
GFR calc Af Amer: >60 mL/min (ref >60)
GFR calc non Af Amer: >60 mL/min (ref >60)
Glucose, Bld: 134 mg/dL — ABNORMAL HIGH (ref 70–99)
Potassium: 3.8 mEq/L (ref 3.5–5.1)
Sodium: 140 mEq/L (ref 135–145)

## 2010-09-26 LAB — POCT HEMOGLOBIN-HEMACUE: Hemoglobin: 13.6 g/dL (ref 13.0–17.0)

## 2010-09-26 LAB — GLUCOSE, CAPILLARY: Glucose-Capillary: 136 mg/dL — ABNORMAL HIGH (ref 70–99)

## 2010-09-30 ENCOUNTER — Ambulatory Visit (INDEPENDENT_AMBULATORY_CARE_PROVIDER_SITE_OTHER): Payer: Medicare Other | Admitting: Cardiology

## 2010-09-30 ENCOUNTER — Encounter: Payer: Self-pay | Admitting: Cardiology

## 2010-09-30 DIAGNOSIS — I4892 Unspecified atrial flutter: Secondary | ICD-10-CM | POA: Insufficient documentation

## 2010-09-30 DIAGNOSIS — Z79899 Other long term (current) drug therapy: Secondary | ICD-10-CM

## 2010-09-30 DIAGNOSIS — I359 Nonrheumatic aortic valve disorder, unspecified: Secondary | ICD-10-CM

## 2010-09-30 DIAGNOSIS — I2510 Atherosclerotic heart disease of native coronary artery without angina pectoris: Secondary | ICD-10-CM | POA: Insufficient documentation

## 2010-09-30 DIAGNOSIS — Z8679 Personal history of other diseases of the circulatory system: Secondary | ICD-10-CM

## 2010-09-30 DIAGNOSIS — I480 Paroxysmal atrial fibrillation: Secondary | ICD-10-CM | POA: Insufficient documentation

## 2010-09-30 DIAGNOSIS — E669 Obesity, unspecified: Secondary | ICD-10-CM

## 2010-09-30 DIAGNOSIS — I4891 Unspecified atrial fibrillation: Secondary | ICD-10-CM

## 2010-09-30 DIAGNOSIS — Z9889 Other specified postprocedural states: Secondary | ICD-10-CM

## 2010-09-30 DIAGNOSIS — I350 Nonrheumatic aortic (valve) stenosis: Secondary | ICD-10-CM

## 2010-09-30 DIAGNOSIS — R0609 Other forms of dyspnea: Secondary | ICD-10-CM

## 2010-09-30 LAB — HEPATIC FUNCTION PANEL
ALT: 30 U/L (ref 0–53)
Alkaline Phosphatase: 55 U/L (ref 39–117)
Bilirubin, Direct: 0.1 mg/dL (ref 0.0–0.3)
Total Bilirubin: 0.6 mg/dL (ref 0.3–1.2)

## 2010-09-30 LAB — CBC WITH DIFFERENTIAL/PLATELET
Basophils Relative: 0.5 % (ref 0.0–3.0)
Eosinophils Absolute: 0.1 10*3/uL (ref 0.0–0.7)
MCHC: 34 g/dL (ref 30.0–36.0)
MCV: 85.6 fl (ref 78.0–100.0)
Monocytes Absolute: 0.7 10*3/uL (ref 0.1–1.0)
Neutro Abs: 5.9 10*3/uL (ref 1.4–7.7)
Neutrophils Relative %: 73.3 % (ref 43.0–77.0)
RBC: 4.95 Mil/uL (ref 4.22–5.81)

## 2010-09-30 LAB — BASIC METABOLIC PANEL
Chloride: 102 mEq/L (ref 96–112)
Creatinine, Ser: 0.9 mg/dL (ref 0.4–1.5)
Potassium: 4 mEq/L (ref 3.5–5.1)
Sodium: 140 mEq/L (ref 135–145)

## 2010-09-30 NOTE — Progress Notes (Signed)
[name] is seen today for 6 week followup visit. He had atrial flutter treated with ablation by Dr. Johney Frame and has been on amiodarone as well as Coumadin. He's not had any recurrent episodes. He had one spell in February but otherwise has done well.  He has known coronary artery disease with prior coronary artery bypass grafting in October 2003 with a LIMA to the LAD, saphenous vein graft to the acute marginal, saphenous vein graft to obtuse marginal, and saphenous vein graft to posterior sitting and posterior lateral branch. His last stress Cardiolite study was in October 2008. He has moderate aortic stenosis with last echocardiogram in August of 2011 showing an ejection fraction of 60% with an aortic valve area of 1.4 cm square. He had minimally invasive disc surgery on his lumbar spine at [facility] in October 2010. He sees Dr. Jarold Motto for general care.  His other issues include diabetes, hypercholesterolemia, and hypertension. He's been complaining of shortness of breath that worsens he stopped his theophylline in December of 2011. He does have bladder outlet symptoms.  We will refer to pulmonary.   No current outpatient prescriptions on file.    Allergies  Allergen Reactions  . Doxycycline     REACTION: swollen tongue    Patient Active Problem List  Diagnoses  . DM  . HYPERLIPIDEMIA  . HYPERTENSION  . EMPHYSEMA  . DYSPNEA    History  Smoking status  . Former Smoker  . Quit date: 08/21/2001  Smokeless tobacco  . Not on file    History  Alcohol Use: Not on file    No family history on file.  Review of Systems:   The patient denies any heat or cold intolerance.  No weight gain or weight loss.  The patient denies headaches or blurry vision.  There is no cough or sputum production.  The patient denies dizziness.  There is no hematuria or hematochezia.  The patient denies any muscle aches or arthritis.  The patient denies any rash.  The patient denies frequent  falling or instability.  There is no history of depression or anxiety.  All other systems were reviewed and are negative.   Physical Exam:   Weight is 2 of 5. Blood pressure is 140/60 sitting, heart rate is 66. He is moderately obese. He is mildly dyspneic with movement from the chair to the exam table.The head is normocephalic and atraumatic.  Pupils are equally round and reactive to light.  Sclerae nonicteric.  Conjunctiva is clear.  Oropharynx is unremarkable.  There's adequate oral airway.  Neck is supple there are no masses.  Thyroid is not enlarged.  There is no lymphadenopathy.  Lungs are clear.  Chest is symmetric.  Heart shows a regular rate and rhythm.  S1 and S2 are normal.  There isa grade 2/6 outflow murmur.  Abdomen is soft normal bowel sounds.  There is no organomegaly.  Genital and rectal deferred.  Extremities are without edema.  Peripheral pulses are adequate.  Neurologically intact.  Full range of motion.  The patient is not depressed.  Skin is warm and dry. Assessment / Plan:

## 2010-09-30 NOTE — Assessment & Plan Note (Signed)
Overall, he seemingly is doing well. Causes shortness of breath, I like to check a BNP and B. Met. I don't think he is having recurrent arrhythmia. We both think this is an exacerbation of chronic COPD resulting from years of smoking cigarettes. I will refer to pulmonary for consideration of bronchodilators. We'll check a BNP and B. Met today.

## 2010-09-30 NOTE — Assessment & Plan Note (Signed)
He has remained in sinus rhythm. He continues to be on amiodarone. He continues on warfarin anticoagulation. We'll continue to monitor his INR. I don't think his amiodarone and is affecting his pulmonary status. He seen Dr. Shelle Iron in the past and we will refer. He sees Dr. Jarold Motto for routine followup.

## 2010-09-30 NOTE — Assessment & Plan Note (Signed)
His last echocardiogram was in August of 2011. He had peak gradient of 29 mm of mercury and mean gradient of 18 mm of mercury. His aortic valve area was calculated to be 1.4 cm square. His EF at that time was 55-60%. Continue to follow this along clinically. I don't think it's the cause of his shortness of breath.

## 2010-10-01 ENCOUNTER — Telehealth: Payer: Self-pay | Admitting: *Deleted

## 2010-10-01 NOTE — Telephone Encounter (Signed)
Pt notified of pulmonary appointment with Dr. Shelle Iron on 10/03/10 at 1145am.

## 2010-10-02 ENCOUNTER — Telehealth: Payer: Self-pay | Admitting: *Deleted

## 2010-10-02 NOTE — Telephone Encounter (Signed)
Message copied by Barnetta Hammersmith on Thu Oct 02, 2010  9:04 AM ------      Message from: Roger Shelter      Created: Wed Oct 01, 2010  8:45 AM       ok

## 2010-10-02 NOTE — Telephone Encounter (Signed)
Pt notified of lab results and will continue same medications.  RN mailed copy of labs to pt and faxed a copy to Dr. Brunilda Payor.

## 2010-10-03 ENCOUNTER — Ambulatory Visit (INDEPENDENT_AMBULATORY_CARE_PROVIDER_SITE_OTHER): Payer: Medicare Other | Admitting: Pulmonary Disease

## 2010-10-03 ENCOUNTER — Encounter: Payer: Self-pay | Admitting: Pulmonary Disease

## 2010-10-03 DIAGNOSIS — R0602 Shortness of breath: Secondary | ICD-10-CM

## 2010-10-03 DIAGNOSIS — J438 Other emphysema: Secondary | ICD-10-CM

## 2010-10-03 NOTE — Patient Instructions (Signed)
Will start on spiriva one inhalation each am Will schedule for breathing studies in 2-3 weeks, and I will see you on same day. Will check your oxygen level overnight while sleeping. Work on weight reduction, some type of exercise program.

## 2010-10-03 NOTE — Progress Notes (Signed)
  Subjective:    Patient ID: Adam [name], male    DOB: 1934-09-21, 76 y.o.   MRN: [id number]  HPI The pt is a 76y/o male who I have been asked to see for emphysema and dyspnea.  He was initially seen in 2009 where he was felt to have multifactorial dyspnea, with airflow obstruction being mild on testing.  He had been doing well on theophylline along with prn albuterol, so I recommended continuing in light of his very mild disease.  He was recently hospitalized for atrial arrhythmia which required ablation, and therefore his theophylline was appropriately discontinued.  The pt feels that he has had increasing doe over the last one year, and currently reports a 1/2 block doe at moderate pace, and will get winded bringing groceries in from the car.  He denies any mucus, congestion, or cough.  He denies worsening LE edema.  The pt was on amiodarone while in the hospital, but states he currently is not taking.   Review of Systems  Constitutional: Positive for unexpected weight change. Negative for fever.  HENT: Positive for congestion. Negative for ear pain, nosebleeds, sore throat, rhinorrhea, sneezing, trouble swallowing, dental problem, postnasal drip and sinus pressure.   Eyes: Negative for redness and itching.  Respiratory: Positive for shortness of breath. Negative for cough, chest tightness and wheezing.   Cardiovascular: Positive for palpitations. Negative for leg swelling.  Gastrointestinal: Negative for nausea and vomiting.  Genitourinary: Negative for dysuria.  Musculoskeletal: Negative for joint swelling.  Skin: Negative for rash.  Neurological: Positive for headaches.  Hematological: Bruises/bleeds easily.  Psychiatric/Behavioral: Positive for dysphoric mood. The patient is not nervous/anxious.        Objective:   Physical Exam Constitutional:  Well developed, no acute distress  HENT:  Nares patent without discharge  Oropharynx without exudate, palate and uvula are  elongated  Eyes:  Perrla, eomi, no scleral icterus  Neck:  No JVD, no TMG  Cardiovascular:  Normal rate, regular rhythm, no rubs or gallops. 2/6 sem        Intact distal pulses  Pulmonary :  Decreased inspiratory effort, basilar crackles, no stridor or respiratory distress   No  rhonchi, or wheezing  Abdominal:  Soft, nondistended, bowel sounds present.  No tenderness noted.   Musculoskeletal:  Minimal lower extremity edema noted.  Lymph Nodes:  No cervical lymphadenopathy noted  Skin:  No cyanosis noted  Neurologic:  Alert, appropriate, moves all 4 extremities without obvious deficit.         Assessment & Plan:

## 2010-10-06 ENCOUNTER — Encounter: Payer: Self-pay | Admitting: Cardiology

## 2010-10-07 ENCOUNTER — Other Ambulatory Visit (HOSPITAL_BASED_OUTPATIENT_CLINIC_OR_DEPARTMENT_OTHER): Payer: Self-pay | Admitting: Internal Medicine

## 2010-10-07 DIAGNOSIS — M545 Low back pain: Secondary | ICD-10-CM

## 2010-10-08 ENCOUNTER — Encounter: Payer: Self-pay | Admitting: Pulmonary Disease

## 2010-10-08 NOTE — Assessment & Plan Note (Addendum)
The pt has significant doe by history, but only mild obstructive disease in the past on pfts.  He is overweight and deconditioned, and has underlying cardiac disease as well.  Will schedule for full pfts to re-evaluate.  He does have mild desaturation with exertion today, but is not excited about wearing oxygen.  Will check ONO since more prolonged desaturation is a more critical issue.

## 2010-10-08 NOTE — Assessment & Plan Note (Signed)
The pt has a h/o mild obstructive disease in 2009, and will start on a maintenance bronchodilator now that his theophylline has been discontinued.  Will add spiriva given his recent atrial arrhythmia and the fact he is no longer smoking.

## 2010-10-10 ENCOUNTER — Other Ambulatory Visit (HOSPITAL_BASED_OUTPATIENT_CLINIC_OR_DEPARTMENT_OTHER): Payer: Self-pay | Admitting: Internal Medicine

## 2010-10-10 ENCOUNTER — Ambulatory Visit
Admission: RE | Admit: 2010-10-10 | Discharge: 2010-10-10 | Disposition: A | Discharge status: Home / Self Care | Payer: Medicare Other | Source: Ambulatory Visit | Attending: Internal Medicine | Admitting: Internal Medicine

## 2010-10-10 ENCOUNTER — Ambulatory Visit (INDEPENDENT_AMBULATORY_CARE_PROVIDER_SITE_OTHER): Payer: Medicare Other | Admitting: *Deleted

## 2010-10-10 DIAGNOSIS — M545 Low back pain: Secondary | ICD-10-CM

## 2010-10-10 DIAGNOSIS — I4891 Unspecified atrial fibrillation: Secondary | ICD-10-CM

## 2010-10-21 ENCOUNTER — Ambulatory Visit (INDEPENDENT_AMBULATORY_CARE_PROVIDER_SITE_OTHER): Payer: Medicare Other | Admitting: *Deleted

## 2010-10-21 DIAGNOSIS — I4891 Unspecified atrial fibrillation: Secondary | ICD-10-CM

## 2010-10-23 ENCOUNTER — Encounter: Payer: Self-pay | Admitting: Pulmonary Disease

## 2010-10-27 ENCOUNTER — Ambulatory Visit (INDEPENDENT_AMBULATORY_CARE_PROVIDER_SITE_OTHER): Payer: Medicare Other | Admitting: Pulmonary Disease

## 2010-10-27 ENCOUNTER — Encounter: Payer: Self-pay | Admitting: Pulmonary Disease

## 2010-10-27 DIAGNOSIS — J438 Other emphysema: Secondary | ICD-10-CM

## 2010-10-27 DIAGNOSIS — R0602 Shortness of breath: Secondary | ICD-10-CM

## 2010-10-27 LAB — PULMONARY FUNCTION TEST

## 2010-10-27 MED ORDER — BUDESONIDE-FORMOTEROL FUMARATE 160-4.5 MCG/ACT IN AERO: 2.0000 | INHALATION_SPRAY | Freq: Two times a day (BID) | RESPIRATORY_TRACT | Status: DC

## 2010-10-27 MED ORDER — TIOTROPIUM BROMIDE MONOHYDRATE 18 MCG IN CAPS: 18.0000 ug | ORAL_CAPSULE | Freq: Every day | RESPIRATORY_TRACT | Status: DC

## 2010-10-27 NOTE — Patient Instructions (Signed)
Stay on spiriva one inhalation once a day Start symbicort 160/4.5  2 inhalations am and pm everyday.  Rinse mouth well Will start on oxygen at night while sleeping only. Work on [employer] loss and conditioning program. followup with me in 6weeks.

## 2010-10-27 NOTE — Progress Notes (Signed)
  Subjective:    Patient ID: Adam [name], male    DOB: Dec 10, 1934, 76 y.o.   MRN: [id number]  HPI The pt comes in today for f/u of his pfts and ONO results.  His pfts show moderate airflow obstruction, mild restriction, and a severe decrease in DLCO.  His ONO on RA shows a low sat of 77%, and he spent 296 min less than or equal to 88% sat during the night.  I have reviewed the studies with him, and answered all of his questions.  He was started on spiriva last visit, and has not seen a big difference so far.    Review of Systems  Constitutional: Positive for unexpected weight change. Negative for fever.  HENT: Positive for congestion, rhinorrhea and sneezing. Negative for ear pain, nosebleeds, sore throat, trouble swallowing, dental problem, postnasal drip and sinus pressure.   Eyes: Negative for redness and itching.  Respiratory: Positive for shortness of breath. Negative for cough, chest tightness and wheezing.   Cardiovascular: Negative for palpitations and leg swelling.  Gastrointestinal: Negative for nausea and vomiting.  Genitourinary: Negative for dysuria.  Musculoskeletal: Negative for joint swelling.  Skin: Negative for rash.  Neurological: Negative for headaches.  Hematological: Bruises/bleeds easily.  Psychiatric/Behavioral: Negative for dysphoric mood. The patient is not nervous/anxious.        Objective:   Physical Exam Ow male in nad Nares patent without discharge or purulence . Chest with basilar crackles, no wheezing or rhonchi Cor with rrr, 3/6 blowing murmur. LE with mild edema, no cyanosis Alert and oriented, moves all 4        Assessment & Plan:

## 2010-10-27 NOTE — Progress Notes (Signed)
PFT done today. 

## 2010-11-02 ENCOUNTER — Encounter: Payer: Self-pay | Admitting: Pulmonary Disease

## 2010-11-02 NOTE — Assessment & Plan Note (Signed)
The pt has moderate airflow obstruction on pfts today most c/w emphysema.  He has not seen a big difference with the start of spiriva, and therefore will step up his regimen given his worsening obstructive disease.  Will add LABA/ICS, and see how he does.  Will also need nocturnal oxygen given the results of his ONO.

## 2010-11-02 NOTE — Assessment & Plan Note (Signed)
His doe is multifactorial, and related to both his underlying heart and lung disease.

## 2010-11-04 NOTE — Procedures (Signed)
CAROTID DUPLEX EXAM   INDICATION:  Follow-up known carotid artery disease.   HISTORY:  Diabetes:  No.  Cardiac:  No.  Hypertension:  Yes.  Smoking:  Quit five years ago.  Previous Surgery:  No.  CV History:  No  Amaurosis Fugax No, Paresthesias No, Hemiparesis No,                                       RIGHT             LEFT  Brachial systolic pressure:         140               136  Brachial Doppler waveforms:         Biphasic          Biphasic  Vertebral direction of flow:        Not well visualized.                Antegrade.  DUPLEX VELOCITIES (cm/sec)  CCA peak systolic                   60                56  ECA peak systolic                   177               143  ICA peak systolic                   153               91  ICA end diastolic                   21                22  PLAQUE MORPHOLOGY:                  Calcified         Calcified  PLAQUE AMOUNT:                      Moderate          Mild  PLAQUE LOCATION:                    ICA and ECA.      ICA and ECA.   IMPRESSION:  A 40-59% stenosis noted in the right internal carotid  artery.  A 20-39% stenosis in the left internal carotid artery.  Antegrade left vertebral artery.  Right vertebral artery not well  visualized.   ___________________________________________  Janetta Hora Fields, MD   MG/MEDQ  D:  01/21/2007  T:  01/22/2007  Job:  [id number]

## 2010-11-04 NOTE — Assessment & Plan Note (Signed)
OFFICE VISIT   DQUAN, CORTOPASSI  DOB:  01-Mar-1935                                       02/08/2008  ZOXWR#:[id number]   The patient returns for follow-up today for known moderate carotid  stenosis and lower extremity claudication symptoms.  He was last seen in  August 2008.  He states that since that time his walking distance is  fairly similar.  He continues to take Pletal and [name] has also  added Neurontin.  He stated with the addition of Neurontin his symptoms  have dramatically improved.  He currently rides a bike approximately 30  minutes a day.  He has no swelling in his lower extremities.  Overall,  he is fairly satisfied with his walking distance, but in some respects  wishes his legs would not tire as easily.  He recently had an operation  on a right 4th finger for a trigger finger and did well from this.   He denies any symptoms of TIA, amaurosis or stroke.  He has no rest  pain.   PHYSICAL EXAMINATION:  VITAL SIGNS:  Today, blood pressure is 134/68 in  the left arm, 130/66 in the right arm, heart rate 73 and regular.  HEENT:  Unremarkable.  Neck:  He has bilateral carotid bruits.  Chest:  Clear to auscultation.  Cardiac Exam:  Regular rate rhythm.  Abdomen:  Obese, soft, nontender, nondistended, no masses.  He has 2+ femoral  pulses bilaterally with absent popliteal and pedal pulses.  Feet are  pink, warm and well-perfused bilaterally.  There is no edema.   He had a repeat carotid duplex exam today which again showed 40-60%  internal carotid artery stenosis bilaterally.  He had bilateral ABIs  performed again today which were 1 on the right side and 0.81 on the  left side.  Overall, the patient's symptoms seem to be fairly stable.  He is not interested in any intervention in his lower extremities at  this time.  His perfusion to his lower extremities actually is quite  reasonable.  He will follow up with me again in 6 months' time  for  repeat carotid duplex exam and ABIs.   Janetta Hora. Fields, MD  Electronically Signed   CEF/MEDQ  D:  02/09/2008  T:  02/09/2008  Job:  [id number]   cc:   Colleen Can. Deborah Chalk, M.D.  Barry Dienes Eloise Harman, M.D.

## 2010-11-04 NOTE — Procedures (Signed)
LOWER EXTREMITY ARTERIAL EVALUATION-SINGLE LEVEL   INDICATION:  Followup of known peripheral vascular disease.  The patient  has no new complaints at this time.   HISTORY:  Diabetes:  Yes, insulin dependent.  Cardiac:  CABG in 2003.  Hypertension:  Yes.  Smoking:  No.  Previous Surgery:   RESTING SYSTOLIC PRESSURES: (ABI)                          RIGHT                LEFT  Brachial:               140                  138  Anterior tibial:        110                  98  Posterior tibial:       112 (0.80)           110 (0.79)  Peroneal:  DOPPLER WAVEFORM ANALYSIS:  Anterior tibial:        Biphasic             Biphasic  Posterior tibial:       Strongly biphasic    Strongly biphasic  Peroneal:   PREVIOUS ABI'S:  Date:  01/21/2007  RIGHT:  1.0  LEFT:  0.81   IMPRESSION:  1. Decrease in right ABI, however, waveforms are biphasic and strongly      biphasic at the ATA and PTA respectively.  2. Stable left ABI.   ___________________________________________  Janetta Hora. Fields, MD   PB/MEDQ  D:  02/08/2008  T:  02/08/2008  Job:  [id number]

## 2010-11-04 NOTE — Procedures (Signed)
CAROTID DUPLEX EXAM   INDICATION:  Followup of known carotid artery disease.  The patient is  currently asymptomatic.   HISTORY:  Diabetes:  Yes, insulin dependent.  Cardiac:  CABG in 2003.  Hypertension:  Yes.  Smoking:  No.  Previous Surgery:  CV History:  Amaurosis Fugax No, Paresthesias No, Hemiparesis No                                       RIGHT             LEFT  Brachial systolic pressure:         140               138  Brachial Doppler waveforms:         Triphasic         Triphasic  Vertebral direction of flow:        Not visualized    Not visualized  DUPLEX VELOCITIES (cm/sec)  CCA peak systolic                   62                72  ECA peak systolic                   224               189  ICA peak systolic                   182               118  ICA end diastolic                   31                26  PLAQUE MORPHOLOGY:                  Calcific with shadowing             Calcific  PLAQUE AMOUNT:                      Moderate          Mild - moderate  PLAQUE LOCATION:                    ICA, ECA          ICA, ECA   IMPRESSION:  1. Right 40-59% ICA stenosis.  2. Left 40-59% ICA stenosis (low end of range).  3. Bilateral ECA stenoses, right > left.  4. Unable to visualize bilateral vertebral arteries due to technical      limitations.   ___________________________________________  Janetta Hora Fields, MD   PB/MEDQ  D:  02/08/2008  T:  02/08/2008  Job:  [id number]

## 2010-11-04 NOTE — Op Note (Signed)
Adam [name], SNELLINGS NO.:  [id number]   MEDICAL RECORD NO.:  [id number]          PATIENT TYPE:  AMB   LOCATION:  DSC                          FACILITY:  [facility]   PHYSICIAN:  Cindee Salt, M.D.       DATE OF BIRTH:  1934-07-25   DATE OF PROCEDURE:  02/07/2008  DATE OF DISCHARGE:                               OPERATIVE REPORT   PREOPERATIVE DIAGNOSIS:  Stenosing tenosynovitis, right middle finger  with mass distal interphalangeal joint, right ring finger.   POSTOPERATIVE DIAGNOSIS:  Stenosing tenosynovitis, right middle finger  with mass distal interphalangeal joint, right ring finger.   OPERATION:  Excision mass right ring finger, release A1 pulley right  middle finger.   SURGEON:  Cindee Salt, MD   ASSISTANT:  Carolyne Fiscal R.N.   ANESTHESIA:  Forearm based IV regional.   ANESTHESIOLOGIST:  Dr. Jean Rosenthal.   HISTORY:  The patient is a 76 year old male with history of triggering  of his right middle finger.  This has not responded to conservative  treatment.  He has elected to undergo release of this.  Pre, peri and  postoperative course have been discussed along with the risks and  complications.  He is aware that there is no guarantee with the surgery,  possibility of infection, recurrence injury to arteries, nerves,  tendons, incomplete relief of symptoms and dystrophy.  In the  preoperative area, the patient is seen.  The extremity marked by both  the patient and surgeon.  He has noticed a mass on the DIP joint volar  aspect, right ring finger.  He is desirous of having this removed.  He  is aware of risks and complications.  This is marked also.  Antibiotic  given.   PROCEDURE:  The patient was brought to the operating room where a  forearm based IV regional anesthetic was carried out without difficulty.  He was prepped using DuraPrep, supine position, right arm free.  A time-  out taken.  An oblique incision was made over the A1 pulley of the right  middle  finger and carried down through the subcutaneous tissue.  Bleeders were electrocauterized.  The neurovascular bundles were  retracted.  The A1 pulley was released on its radial aspect.  A  significant adherence between the tenosynovium of the superficialis  profundus was noted.  This was excised.  The finger placed through a  full range motion and no further triggering was evident.  The wound was  irrigated and closed with interrupted 5-0 Vicryl Rapide sutures.  A  volar incision was made obliquely over the middle phalanx of the ring  finger and carried down through the subcutaneous tissue.  A  multilobulated, tan brown mass was immediately encountered.  The  neurovascular bundles were identified and protected.  The mass was  excised in total and found to arise from the flexor sheath.  This was  sent to pathology.  The wound was irrigated.  The skin closed with  interrupted 5-0 Vicryl Rapide sutures.  A sterile compressive dressing  applied to the hand and to the finger was applied.  The patient  tolerated the procedure well and was taken to the recovery room for  observation in satisfactory condition.  He will be discharged home to  return to the [facility] [place] in 1 week, on Vicodin.           ______________________________  Cindee Salt, M.D.     GK/MEDQ  D:  02/07/2008  T:  02/08/2008  Job:  [id number]   cc:   Barry Dienes. Eloise Harman, M.D.  Cindee Salt, M.D.

## 2010-11-06 ENCOUNTER — Encounter: Payer: Self-pay | Admitting: Pulmonary Disease

## 2010-11-07 NOTE — Op Note (Signed)
NAME:  Adam [name], Adam [name] NO.:  [id number]   MEDICAL RECORD NO.:  [id number]                   PATIENT TYPE:  INP   LOCATION:  2314                                 FACILITY:  [facility]   PHYSICIAN:  Evelene Croon, MD                    DATE OF BIRTH:  1934-11-25   DATE OF PROCEDURE:  04/13/2002  DATE OF DISCHARGE:                                 OPERATIVE REPORT   PREOPERATIVE DIAGNOSES:  Left main and three-vessel coronary artery disease.   POSTOPERATIVE DIAGNOSES:  Left main and three-vessel coronary artery  disease.   OPERATION PERFORMED:  Median sternotomy, extracorporeal circulation,  coronary artery bypass grafting surgery times five using a left internal  mammary artery  graft to the left anterior descending coronary artery, with  a saphenous vein graft to the obtuse marginal  branch of the left circumflex  coronary artery, a sequential saphenous vein graft to the posterior  descending branch of the right coronary artery and the posterolateral branch  of the left circumflex coronary artery, and a saphenous vein graft to acute  marginal branch of the right coronary artery.  Endoscopic vein harvesting  from the right thigh.   SURGEON:  Alleen Borne, M.D.   ASSISTANT:  1. Salvatore Decent. Dorris Fetch, M.D.  2. Coral Ceo, PA-C   ANESTHESIA:  General endotracheal.   INDICATIONS FOR PROCEDURE:  The patient is a 76 year old gentleman with  multiple cardiac risk factors.  Since late August he has had worsening  episodes chest pressure and tightness as well as shortness of breath and  decreased energy level.  He recently underwent a stress Cardiolite scan on  March 21, 2002 which showed inferolateral ischemia.  Ejection fraction  was 62%.  He underwent cardiac catheterization on April 07, 2002 by Dr.  Deborah Chalk.  The left main coronary artery was a large vessel that had a distal  taper with about 40% stenosis.  The left circumflex was totally  occluded  proximally with right to left collateral stealing a large posterolateral  branch.  There was an obtuse marginal branch that filled by collaterals from  the left.  The LAD had about 40% proximal narrowing.  It was a large vessel  that wrapped around the apex.  The right coronary artery was a wide vessel  that had 90% irregular plaque at the level of a large right ventricular  branch in the proximal portion.  There is also about 60% irregular raised  plaque prior to the acute marginal.  The ejection fraction was 60 to 70%.   After review of the angiogram and examination of the patient, it was felt  that coronary artery bypass graft surgery was the best treatment.  I  discussed the operative procedure with the patient and his wife including  alternatives, benefits, and risks including bleeding, possible blood  transfusion, infection, stroke, myocardial infarction and death.  They  understood and agreed to proceed.   DESCRIPTION OF PROCEDURE:  The patient was taken to the operating room and  placed on the table in supine position.  After induction of general  endotracheal anesthesia, a Foley catheter was placed in the bladder using  sterile technique.  Then the chest, abdomen and both lower extremities were  prepped and draped in the usual sterile manner.  The chest was entered  through a median sternotomy incision and the pericardium opened in the  midline.  Examination of the heart showed good ventricular contractility.  The ascending aorta had no palpable plaques in it.   Then the left internal mammary artery was harvested from the chest wall as a  pedicle graft.  This was a medium caliber vessel with excellent blood flow  through it.  At the same time, a segment of greater saphenous vein was  harvested from the right leg using endoscopic vein harvest technique.  This  vein was of medium caliber and good quality.   Then the patient was heparinized and when an adequate  activated clotting  time was achieved, the distal ascending aorta was cannulated using a 20  French aortic cannula for arterial inflow.  Venous outflow was achieved  using a two-stage venous cannula for the right atrial appendage.  An  antegrade cardioplegia and vent cannula was inserted in the aortic root.   The patient was placed on cardiopulmonary bypass and the distal coronary  arteries identified.  He had diffuse distal disease.  The LAD was a large  vessel that had segmental plaque in its proximal and midportions.  There was  a large diagonal branch that had no significant disease in it.  There was a  medium sized intermediate vessel that had no significant disease.  The  distal left circumflex gave off a branching marginal artery.  This became  intramyocardial.  It was located proximally where one of the sub branches  was graftable.  There was also a large posterolateral branch that was  graftable.  The right coronary artery was diffusely diseased and gave off a  medium sized posterior descending branch that had no significant disease in  it.   Then the aorta was crossclamped and 500 cc of cold blood antegrade  cardioplegia was administered in the aortic root with quick arrest of the  heart.  Systemic hypothermia to 20 degrees centigrade and topical  hypothermia with iced saline was used.  A temperature probe was placed on  the septum and insulating pad in the pericardium.   The first distal anastomosis was performed to the posterior descending  coronary artery.  The internal diameter was 1.6 mm.  The conduit used was  a  segment of greater saphenous vein.  The anastomosis was performed in a  sequential side-to-side manner using continuous 7-0 Prolene suture.  Flow  was measured through the graft and was excellent.   A second distal anastomosis was performed to the posterolateral branch of the left circumflex coronary artery.  The internal diameter of this vessel  was about  1.75 mm.  The conduit used was  the same segment of the greater  saphenous vein.  The anastomosis was performed in a sequential end-to-side  manner using continuous 7-0 Prolene suture.  Flow was measured through the  graft and was excellent.  Then a dose of cardioplegia was given down the  vein graft and the aortic root.   The third distal anastomosis was performed to the obtuse marginal branch.  The internal diameter was about 1.6 mm.  The conduit used was a segment of  greater saphenous vein.  The anastomosis was performed in an end-to-side  manner using continuous 7-0 Prolene suture.  Flow was measured through the  graft and was excellent.  Then another dose of cardioplegia was given down  the vein grafts and in the aortic root.   Then the fourth distal anastomosis was performed to the acute marginal  branch of the right coronary artery.  The internal diameter of this vessel  was only about 1.6 mm.  This was much smaller than the diagonal branch.  The  conduit used was a third segment of greater saphenous vein.  The anastomosis  was performed in a end-to-side manner using continuous 7-0 Prolene suture.  Flow was measured through the graft and was excellent.   The fifth distal anastomosis was performed to the diagonal branch of the  left anterior descending coronary artery.  The internal diameter in this  vessel was about 2 mm.  The conduit used was the left internal mammary  artery graft and this was brought through an opening in the left pericardium  anterior to the phrenic nerve.  It was anastomosed to the LAD in end-to-side  manner using continuous 8-0 Prolene suture.  The pedicle was tacked to the  epicardium with 6-0 Prolene sutures.  The patient was rewarmed to 37 degrees  centigrade and the clamp removed from the mammary pedicle.  There was rapid  warming of the ventricular septum and return of spontaneous ventricular  fibrillation.  The crossclamp was removed with time of 66  minutes and the  patient defibrillated into sinus rhythm.   A partial occlusion clamp was placed on the aortic root and three proximal  vein graft anastomoses were performed in end-to-side manner using continuous  6-0 Prolene suture.  The clamp was removed, the vein grafts deaired and the  clamps removed from them.  The proximal and distal anastomoses appeared  hemostatic and the line of the graft satisfactory.  Graft markers were  placed around the proximal anastomoses.  Two temporary right ventricular and  right atrial pacing wires placed and brought out through the skin.   When the patient had rewarmed to 37 degrees centigrade, he was weaned from  cardiopulmonary bypass on low-dose dopamine.  Total bypass time was 109  minutes.  Cardiac function appeared excellent with a cardiac output of 6L  per minute.  Protamine was given and the venous and aortic cannulas were removed without difficulty.  Hemostasis was achieved.  Three chest tubes  were placed with a tube in the posterior pericardium, one in the left  pleural space and one in the anterior mediastinum.  The pericardium was  easily approximated over the heart.  The sternum was closed with #6  stainless steel wires.  The fascia was closed with continuous #1 Vicryl  suture.  The subcutaneous tissue was closed with continuous 2-0 Vicryl and  the skin with 3-0 Vicryl subcuticular closure.  The lower extremity vein  harvest site was closed in layers in a similar manner.  Sponge, needle and  instrument counts were correct according to the scrub nurse.  Dry sterile  dressings were applied over the incisions, around the chest tubes which were  hooked to Pleur-evac suction.  The patient remained hemodynamically stable  and was transported to the SICU in guarded but stable condition.  Evelene Croon, MD    BB/MEDQ  D:  04/13/2002  T:  04/13/2002  Job:  [id number]   cc:   Colleen Can. Deborah Chalk, M.D.   [address], [address]  [place]  [place] [address]  Fax: [phone]   Cardiac cath lab

## 2010-11-07 NOTE — Assessment & Plan Note (Signed)
OFFICE VISIT   KEKAI, GETER  DOB:  [birth year]                                       01/21/2007  BJYNW#:[id number]   Mr. Anello returns for follow up today for claudication symptoms.  He  was last seen in April 2008.  At that time, he was complaining of  claudication symptoms, primarily in his left leg.  He was started on  Pletal and a walking program.  He has been fairly compliant with the  Pletal and somewhat compliant with the walking program.  However,  overall his claudication symptoms have improved significantly.  He is  currently fairly satisfied with his walking distance.  He has no rest  pain and has had no evidence of nonhealing wounds.   We have also been following him for a moderate carotid stenosis.  He  denies any episodes of TIA, amaurosis or stroke.   PHYSICAL EXAMINATION:  Vital Signs:  Blood pressure is 141/70, heart  rate is 84 and regular.  HEENT:  Unremarkable.  Neck:  2+ carotid pulses  without bruit.  Extremities:  Feet are pink, warm and well-perfused.  He  has 2+ femoral, 1+ popliteal and 1+ dorsalis pedis pulses bilaterally.   ABIs today were 0.81 on the left and 1 on the right.  Carotid duplex  exam showed a 40-60% stenosis on the right internal carotid artery and  less than 40% stenosis in the left internal carotid artery.  There is  antegrade vertebral flow on the left.  The right vertebral artery was  not well-visualized.   Overall, Mr. Michael seems to be satisfied with his current walking  distance.  He has fairly minimal claudication symptoms and reasonable  ABIs, so I would not entertain any intervention at this time.  As far as  his carotid disease is concerned, we should consider to treat him with  respect or modification and aspirin therapy.  He will follow up in one  year for repeat carotid duplex exam and ABIs.   Janetta Hora. Fields, MD  Electronically Signed   CEF/MEDQ  D:  01/24/2007  T:  01/24/2007  Job:   [id number]   cc:   Colleen Can. Deborah Chalk, M.D.  Barry Dienes Eloise Harman, M.D.

## 2010-11-07 NOTE — Discharge Summary (Signed)
NAMEKEBRON, PULSE NO.:  [id number]   MEDICAL RECORD NO.:  [id number]                    PATIENT TYPE:   LOCATION:                                       FACILITY:   PHYSICIAN:  Coral Ceo, PA                    DATE OF BIRTH:  10-27-1934   DATE OF ADMISSION:  04/13/2002  DATE OF DISCHARGE:  04/21/2002                                 DISCHARGE SUMMARY   PRIMARY ADMISSION DIAGNOSES:  1. Severe three vessel coronary artery disease.  2. Left main coronary artery disease.   ADDITIONAL DIAGNOSES:  1. Hyperlipidemia.  2. Hypertension.  3. History of kidney stones.  4. Gastroesophageal reflux disease.  5. Postoperative atrial fibrillation.   PROCEDURE:  1. Coronary artery bypass grafting times five (left internal mammary artery     to the LAD, saphenous vein graft to the acute marginal, saphenous vein     graft to the obtuse marginal, sequential saphenous vein graft to the     posterior descending and posterolateral arteries).  2. Endoscopic vein harvest, right thigh to lower calf.   HISTORY OF PRESENT ILLNESS:  The patient is a 76 year old male who, over the  past one to two months, has had increasing episodes of chest pressure and  tightness as well as shortness of breath. These episodes occur mainly on  exertion and are relieved with rest. On March 21, 2002 he underwent a  Cardiolite scan which showed inferolateral ischemia with an ejection  fraction of 62%. He then underwent cardiac catheterization on April 07, 2002. He was found to have significant three vessel coronary artery disease  as well as mild left main disease. Because of his significant coronary  disease and his multiple risk factors, it was felt that he would be a good  candidate for surgical re-vascularization. He was refereed to Dr. Evelene Croon and it was agreed that he should proceed with surgery.   HOSPITAL COURSE:  The patient was admitted on April 13, 2002 and  taken to  the OR where he underwent coronary artery bypass grafting times five with  the above noted grafts. He tolerated the procedure well and was transferred  to the Surgical Intensive Care Unit in stable condition. He was extubated  shortly after surgery. He is hemodynamically stable and doing well on  postoperative day one. He initially had frequent PAC's on telemetry. He was  started on Lopressor and his dose was titrated upward. Late in the day on  postoperative day two, he went into rapid atrial fibrillation. He was  started on a Cardizem drip in addition to his beta blocker therapy. He was  later transferred to the floor. He has continued to have intermittent  problems with rapid atrial fibrillation. In addition to Cardizem and  Lopressor, Lanoxin was added to his regimen. On postoperative day three, he  was switched from  IV Cardizem to by mouth and was started additionally on  Amiodarone. At this time, he is only having intermittent episodes of atrial  fibrillation and is mostly maintaining sinus rhythm. When he does laps into  atrial fibrillation, his rates are well controlled. He is currently  therapeutic on Coumadin with an INR of 2.2. He has otherwise done well  postoperatively. He has been diuresed back down to within 2 pounds of his  preoperative weight. He also had an episode of fevers up to 101 with a  slight increase in his WBC count to 17,000. He was started on IV Maxipime  for antibiotic coverage. Since that time, he has been afebrile and his white  blood count has trended downward, at last check, to 14,000. A CBC has been  ordered for the morning of April 21, 2002 to re-evaluate this. He has been  ambulating in the halls without difficulty with cardiac rehabilitation phase  I. His other labs have remained stable. His incisions are all healing well.  He is tolerating a regular diet and is having normal bowel and bladder  function. He has been counseled on smoking  cessation. It is felt that if he  continues to remain stable, he will be ready for discharge to home on  April 21, 2002.   DISCHARGE MEDICATIONS:  1. Enteric coated aspirin 81 mg by mouth each day.  2. Lanoxin 0.125 mg by mouth each day.  3. Amiodarone 400 mg by mouth twice a day times one week then 400 mg each     day.  4. Lopressor 50 mg twice a day.  5. Cardizem CD 240 mg each day.  6. Tylox one to two every four hours as needed pain.  7. Lipitor 20 mg each day.  8. Prevacid 20 mg twice a day.  9. Vitamin E 400 IU each day.  10.      Vitamin C 500 mg every day.  11.      Coumadin home dose to be determined by PT and INR on the day of     discharge.   ACTIVITY:  He is to refrain from driving, heavy lifting, or strenuous  activity.   DIET:  Continue low fat, low sodium diet.   WOUND CARE:  He may shower daily and clean his incisions with soap and  water.   FOLLOW UP:  With Dr. Deborah Chalk in two weeks and have a chest x-ray at that  visit. He is also asked to have a PT and INR drawn on Monday, April 24, 2002 at Dr. Ronnald Nian office for further Coumadin dosing. He will go and see  Dr. Laneta Simmers on Tuesday, May 09, 2002 at 10:15 a.m. He is asked to bring  his chest x-ray to this appointment.                                               Coral Ceo, PA    GC/MEDQ  D:  04/20/2002  T:  04/21/2002  Job:  [id number]   cc:   Evelene Croon, MD  [address]  [place]  [place] [address]  Fax: [phone]   Colleen Can. Deborah Chalk, M.D.  [address]  [place]  [place] [address]  Fax: [phone]   Vania Rea. Jarold Motto, M.D. [facility]

## 2010-11-07 NOTE — Cardiovascular Report (Signed)
NAME:  Adam [name], Adam [name] NO.:  [id number]   MEDICAL RECORD NO.:  [id number]                   PATIENT TYPE:  OIB   LOCATION:  2899                                 FACILITY:  [facility]   PHYSICIAN:  Colleen Can. Deborah Chalk, M.D.            DATE OF BIRTH:  02-Nov-1934   DATE OF PROCEDURE:  04/07/2002  DATE OF DISCHARGE:                              CARDIAC CATHETERIZATION   PROCEDURES:  Left heart catheterization with selective coronary angiography,  left ventricular angiography.   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  Cardiovascular risk factors include hypertension,  hypercholesterolemia, obesity, and cigarette abuse. He has had chest pain,  worsening shortness of breath.  He does have a positive family history of  heart disease.   TYPE AND SITE OF ENTRY:  Percutaneous right femoral artery with Perclose.   CATHETERS:  A 6 French 4 curved right and left coronary catheters, 6 French  pigtail ventriculographic catheter.   CONTRAST MATERIAL:  Omnipaque.   MEDICATIONS GIVEN PRIOR TO THE PROCEDURE:  Valium 10 mg p.o.   MEDICATIONS GIVEN DURING THE PROCEDURE:  Versed 3 mg IV, Ancef 1 g IV.   COMMENTS:  The patient tolerated the procedure well.   HEMODYNAMIC DATA:  The aortic pressure was 143/66,  LV was 162/12-23.  There  was no aortic valve gradient noted on pullback.   ANGIOGRAPHIC DATA:  1. Left main coronary artery:  Left main coronary artery had a mild distal     taper.  Otherwise was normal.  2. Left circumflex:  Left circumflex is totally occluded proximally.  There     are right collaterals distally.  It would appear that the marginal     vessels would be bypassable.  3. There are small intermediate vessels basically satisfactory.  4. Left anterior descending:  The left anterior descending has 30-40%     proximal narrowing, is a large vessel with irregularities, otherwise it     wraps around the  apex.  5. Right coronary artery:  The right coronary artery is a large vessel.  It     gives right to left collaterals. There is 90% irregular plaque at the     level of a large RV branch proximally on the right coronary artery.     Distally just prior to the acute margin is a 60% irregular raised plaque.     The distal vessels would be suitable for bypass grafting.   LEFT VENTRICULOGRAPHY:  Left ventricular angiogram was performed in the RAO  position.  Overall cardiac size and silhouette were normal.  Global function  shows an ejection fraction of 60-70%.  Regional wall motion is normal.    OVERALL IMPRESSION:  Severe predominately two-vessel coronary disease with  30-40% proximal left anterior descending, totally occluded left circumflex  and 90% right coronary artery.   PLAN:  Will recommend that he have  coronary artery bypass grafting.                                                   Colleen Can. Deborah Chalk, M.D.    SNT/MEDQ  D:  04/07/2002  T:  04/07/2002  Job:  [id number]   cc:   Barry Dienes. Eloise Harman, M.D.

## 2010-11-07 NOTE — H&P (Signed)
NAME:  Melburn, Treiber NO.:  [id number]   MEDICAL RECORD NO.:  [id number]                   PATIENT TYPE:   LOCATION:                                       FACILITY:   PHYSICIAN:  Colleen Can. Deborah Chalk, M.D.            DATE OF BIRTH:  [birth year]   DATE OF ADMISSION:  DATE OF DISCHARGE:                                HISTORY & PHYSICAL   CHIEF COMPLAINT:  Chest pain, worsening shortness of breath.   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 He was  referred for a stress Cardiolite study which was  performed on March 21, 2002. The patient was exercised on the standard  Bruce protocol for a total of  4 minutes and 30 seconds to a maximum of 1.9  miles per hour at 12% grade. His heart rate rose to 138 and blood pressure  rose to 230/80. The test was stopped due to fatigue and  shortness of  breath. His resting electrocardiogram showed no specific ST or T-wave  changes. There was a Q-wave noted in AVL. With exercise, however, there was  definite ST segment depression in the inferolateral leads consistent with  ischemia. The quantitative dated specs showed an ejection fraction of 62%.  The regional wall motion was felt to probably be within normal limits,  although there was some questionable mild inferolateral hypokinesia. Based  on these findings, the patient is now referred to undergo coronary  angiography.   PAST MEDICAL HISTORY:  1. Tobacco abuse.  2. Hyperlipidemia.  3. Hypertension.  4. History of a fractured leg in the  remote past.  5. 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   ALLERGIES:  None.   CURRENT MEDICATIONS:  1. Prilosec 20 mg b.i.d.  2. Zantac p.r.n.  3. Lipitor 20 mg q.d.  4. Aspirin q.d.  5. Cozaar 100 mg q.d.  6. Ziac 5 mg q.d.  7. Cardizem CD 180 mg q.d.  8. Multivitamin q.d.  9. Allegra  p.r.n.   FAMILY HISTORY:  Both of his parents are deceased. His father died at 49  with a heart attack, adult onset diabetes and stroke. His mother died at 69  with a stroke as well.   SOCIAL HISTORY:  He is a Visual merchandiser. He has had tobacco use over  the past 50  years of one pack a day but quit approximately one week ago. There is rare  alcohol use.   REVIEW OF SYSTEMS:  Basically as noted above. He describes his chest pain as  an indigestion like feeling. Zantac does provide relief. He has had ongoing  trouble with shortness of breath, especially with exertion. He has had no  light headedness, no dizziness, no frank syncope.   PHYSICAL EXAMINATION:  GENERAL:  He is a pleasant,  talkative white male in  no acute distress.  VITAL SIGNS:  Blood pressure 134/80 sitting, 130/80 standing. Heart rate 76  and regular, respirations  18 and afebrile.  SKIN:  Warm and dry, color is unremarkable.  LUNGS:  Clear.  HEART:  Regular rhythm.  ABDOMEN:  Soft, positive bowel sounds, nontender.  EXTREMITIES:  Without edema.  NEUROLOGIC:  Intact. There are no gross focal deficits.   LABORATORY DATA:  Pending.   IMPRESSION:  1. Abnormal stress Cardiolite study.  2. Chest pain and worsening  shortness of breath.  3. Hypertension.  4. Hyperlipidemia.  5. Gastroesophageal reflux disease.   PLAN:  Will proceed on with elective cardiac catheterization and the  procedure has been discussed in full detail and he is willing to proceed.     Juanell Fairly C. Earl Gala, N.P.                 Colleen Can. Deborah Chalk, M.D.    LCO/MEDQ  D:  03/29/2002  T:  03/31/2002  Job:  [id number]   cc:   Colleen Can. Deborah Chalk, M.D.  [address]  [place]  [place] [address]  Fax: [phone]   Vania Rea. Jarold Motto, M.D. [facility]

## 2010-11-12 ENCOUNTER — Encounter: Payer: Self-pay | Admitting: Pulmonary Disease

## 2010-11-14 ENCOUNTER — Ambulatory Visit
Admission: RE | Admit: 2010-11-14 | Discharge: 2010-11-14 | Disposition: A | Discharge status: Home / Self Care | Payer: Medicare Other | Source: Ambulatory Visit | Attending: Internal Medicine | Admitting: Internal Medicine

## 2010-11-14 ENCOUNTER — Ambulatory Visit (INDEPENDENT_AMBULATORY_CARE_PROVIDER_SITE_OTHER): Payer: Medicare Other | Admitting: *Deleted

## 2010-11-14 DIAGNOSIS — M545 Low back pain: Secondary | ICD-10-CM

## 2010-11-14 DIAGNOSIS — I4891 Unspecified atrial fibrillation: Secondary | ICD-10-CM

## 2010-11-24 ENCOUNTER — Ambulatory Visit (INDEPENDENT_AMBULATORY_CARE_PROVIDER_SITE_OTHER): Payer: Medicare Other | Admitting: *Deleted

## 2010-11-24 DIAGNOSIS — I4891 Unspecified atrial fibrillation: Secondary | ICD-10-CM

## 2010-12-03 ENCOUNTER — Ambulatory Visit (INDEPENDENT_AMBULATORY_CARE_PROVIDER_SITE_OTHER): Payer: Medicare Other | Admitting: *Deleted

## 2010-12-03 DIAGNOSIS — I4891 Unspecified atrial fibrillation: Secondary | ICD-10-CM

## 2010-12-03 LAB — POCT INR: INR: 1.9

## 2010-12-08 ENCOUNTER — Encounter: Payer: Self-pay | Admitting: Pulmonary Disease

## 2010-12-08 ENCOUNTER — Ambulatory Visit (INDEPENDENT_AMBULATORY_CARE_PROVIDER_SITE_OTHER): Payer: Medicare Other | Admitting: Pulmonary Disease

## 2010-12-08 VITALS — BP 112/66 | HR 61 | Temp 98.3°F | Ht 66.0 in | Wt 197.8 lb

## 2010-12-08 DIAGNOSIS — J438 Other emphysema: Secondary | ICD-10-CM

## 2010-12-08 NOTE — Progress Notes (Signed)
  Subjective:    Patient ID: Adam [name], male    DOB: Nov 11, 1934, 76 y.o.   MRN: [id number]  HPI The pt comes in today for f/u of his known emphysema.  At the last visit, symbicort was added to his spiriva to see if this would improve his doe, and he does feel this has helped.  He is also working on weight loss and a conditioning program.  He denies any worsening sob, congestion, or cough with mucus.    Review of Systems  Constitutional: Positive for unexpected weight change. Negative for fever.  HENT: Positive for congestion and rhinorrhea. Negative for ear pain, nosebleeds, sore throat, sneezing, trouble swallowing, dental problem, postnasal drip and sinus pressure.   Eyes: Negative for redness and itching.  Respiratory: Positive for cough and shortness of breath. Negative for chest tightness and wheezing.   Cardiovascular: Negative for palpitations and leg swelling.  Gastrointestinal: Negative for nausea and vomiting.  Genitourinary: Negative for dysuria.  Musculoskeletal: Negative for joint swelling.  Skin: Negative for rash.  Neurological: Positive for headaches.  Hematological: Bruises/bleeds easily.  Psychiatric/Behavioral: Negative for dysphoric mood. The patient is not nervous/anxious.        Objective:   Physical Exam Ow male in nad Chest clear to auscultation except bibasilar crackles.  No wheezing Cor with rrr LE without edema, no cyanosis noted.  Alert, oriented, moves all 4        Assessment & Plan:

## 2010-12-08 NOTE — Patient Instructions (Signed)
Continue with spiriva and symbicort for the next 4 weeks.  If you feel symbicort is not contributing a lot to your breathing, ok to discontinue Continue your exercise program and weight loss, you are doing great. followup with me in 6mos

## 2010-12-13 NOTE — Assessment & Plan Note (Signed)
The pt has seen improvement in his breathing on spiriva/symbicort, but is unsure how much the latter is adding to overall condition.  He is willing to stay on this a little longer, and I have encouraged him to continue with weight loss and exercise program.

## 2010-12-31 ENCOUNTER — Other Ambulatory Visit: Payer: Self-pay | Admitting: *Deleted

## 2010-12-31 ENCOUNTER — Ambulatory Visit (INDEPENDENT_AMBULATORY_CARE_PROVIDER_SITE_OTHER): Payer: Medicare Other | Admitting: *Deleted

## 2010-12-31 DIAGNOSIS — I4891 Unspecified atrial fibrillation: Secondary | ICD-10-CM

## 2010-12-31 LAB — POCT INR: INR: 1.7

## 2010-12-31 MED ORDER — METOPROLOL TARTRATE 100 MG PO TABS: 100.0000 mg | ORAL_TABLET | Freq: Two times a day (BID) | ORAL | Status: DC

## 2011-01-09 ENCOUNTER — Other Ambulatory Visit (HOSPITAL_BASED_OUTPATIENT_CLINIC_OR_DEPARTMENT_OTHER): Payer: Self-pay | Admitting: Internal Medicine

## 2011-01-09 DIAGNOSIS — M545 Low back pain: Secondary | ICD-10-CM

## 2011-01-16 ENCOUNTER — Ambulatory Visit
Admission: RE | Admit: 2011-01-16 | Discharge: 2011-01-16 | Disposition: A | Discharge status: Home / Self Care | Payer: Medicare Other | Source: Ambulatory Visit | Attending: Internal Medicine | Admitting: Internal Medicine

## 2011-01-16 ENCOUNTER — Ambulatory Visit (INDEPENDENT_AMBULATORY_CARE_PROVIDER_SITE_OTHER): Payer: Medicare Other | Admitting: *Deleted

## 2011-01-16 DIAGNOSIS — I4891 Unspecified atrial fibrillation: Secondary | ICD-10-CM

## 2011-01-16 DIAGNOSIS — M545 Low back pain: Secondary | ICD-10-CM

## 2011-01-16 LAB — POCT INR: INR: 1

## 2011-01-16 MED ORDER — IOHEXOL 180 MG/ML  SOLN
1.0000 mL | Freq: Once | INTRAMUSCULAR | Status: AC | PRN
  Administered 2011-01-16: 1 mL via EPIDURAL

## 2011-01-16 MED ORDER — METHYLPREDNISOLONE ACETATE 40 MG/ML INJ SUSP (RADIOLOG
120.0000 mg | Freq: Once | INTRAMUSCULAR | Status: AC
  Administered 2011-01-16: 120 mg via EPIDURAL

## 2011-01-27 ENCOUNTER — Ambulatory Visit (INDEPENDENT_AMBULATORY_CARE_PROVIDER_SITE_OTHER): Payer: Medicare Other | Admitting: *Deleted

## 2011-01-27 DIAGNOSIS — I4891 Unspecified atrial fibrillation: Secondary | ICD-10-CM

## 2011-01-27 LAB — POCT INR: INR: 1.4

## 2011-01-28 ENCOUNTER — Encounter: Payer: Medicare Other | Admitting: *Deleted

## 2011-02-10 ENCOUNTER — Ambulatory Visit (INDEPENDENT_AMBULATORY_CARE_PROVIDER_SITE_OTHER): Payer: Medicare Other | Admitting: *Deleted

## 2011-02-10 ENCOUNTER — Other Ambulatory Visit: Payer: Self-pay | Admitting: Cardiology

## 2011-02-10 DIAGNOSIS — I4891 Unspecified atrial fibrillation: Secondary | ICD-10-CM

## 2011-02-10 LAB — POCT INR: INR: 1.8

## 2011-02-10 MED ORDER — WARFARIN SODIUM 5 MG PO TABS: ORAL_TABLET | ORAL | Status: DC

## 2011-02-10 NOTE — Telephone Encounter (Signed)
Med refill

## 2011-02-25 ENCOUNTER — Ambulatory Visit (INDEPENDENT_AMBULATORY_CARE_PROVIDER_SITE_OTHER): Payer: Medicare Other | Admitting: *Deleted

## 2011-02-25 DIAGNOSIS — I4891 Unspecified atrial fibrillation: Secondary | ICD-10-CM

## 2011-03-09 ENCOUNTER — Other Ambulatory Visit: Payer: Self-pay | Admitting: Internal Medicine

## 2011-03-09 DIAGNOSIS — M549 Dorsalgia, unspecified: Secondary | ICD-10-CM

## 2011-03-11 ENCOUNTER — Encounter: Payer: Medicare Other | Admitting: *Deleted

## 2011-03-12 ENCOUNTER — Ambulatory Visit (INDEPENDENT_AMBULATORY_CARE_PROVIDER_SITE_OTHER): Payer: Medicare Other | Admitting: *Deleted

## 2011-03-12 DIAGNOSIS — I4891 Unspecified atrial fibrillation: Secondary | ICD-10-CM

## 2011-03-16 ENCOUNTER — Telehealth: Payer: Self-pay | Admitting: Pharmacist

## 2011-03-16 NOTE — Telephone Encounter (Signed)
Pt aware to hold Coumadin per MD's instructions

## 2011-03-16 NOTE — Telephone Encounter (Signed)
Message copied by Velda Shell on Mon Mar 16, 2011 11:19 AM ------      Message from: [name]      Created: Thu Mar 12, 2011  4:06 PM       Should be fine without a bridge unless he has had a stroke while off of coumadin previously or a mechanical valve.            ----- Message -----         From: Mariane Masters, PHARMD         Sent: 03/12/2011   2:57 PM           To: Gardiner Rhyme, MD            Pt is a Deborah Chalk pt who is seeing you in the next few months.  He has a spinal injection on 10/4 and needs to hold Coumadin.  He has done this in the past with no Lovenox bridge.  Is this okay?  Thanks.

## 2011-03-17 LAB — BLOOD GAS, ARTERIAL
Bicarbonate: 24.9 — ABNORMAL HIGH
O2 Saturation: 93.5
Patient temperature: 98.6
TCO2: 26.1
pH, Arterial: 7.424

## 2011-03-23 LAB — CBC
HCT: 41.4
Hemoglobin: 14.2
MCHC: 34.2
MCV: 87.6
Platelets: 316
RBC: 4.73
RDW: 12.6
WBC: 12.8 — ABNORMAL HIGH

## 2011-03-23 LAB — URINALYSIS, ROUTINE W REFLEX MICROSCOPIC
Bilirubin Urine: NEGATIVE
Glucose, UA: NEGATIVE
Ketones, ur: NEGATIVE
Leukocytes, UA: NEGATIVE
Nitrite: NEGATIVE
Protein, ur: NEGATIVE
Specific Gravity, Urine: 1.021
Urobilinogen, UA: 1
pH: 6

## 2011-03-23 LAB — COMPREHENSIVE METABOLIC PANEL
AST: 33
Albumin: 4.4
Alkaline Phosphatase: 47
BUN: 23
CO2: 27
Chloride: 104
GFR calc non Af Amer: >60
Potassium: 4.3
Total Bilirubin: 0.6

## 2011-03-23 LAB — DIFFERENTIAL
Basophils Absolute: 0.1
Basophils Relative: 1
Eosinophils Absolute: 0.2
Eosinophils Relative: 2
Lymphocytes Relative: 12
Lymphs Abs: 1.6
Monocytes Absolute: 0.8
Monocytes Relative: 6
Neutro Abs: 10.1 — ABNORMAL HIGH
Neutrophils Relative %: 79 — ABNORMAL HIGH

## 2011-03-23 LAB — COMPREHENSIVE METABOLIC PANEL WITH GFR
ALT: 26
Calcium: 9.1
Creatinine, Ser: 0.9
GFR calc Af Amer: >60
Glucose, Bld: 150 — ABNORMAL HIGH
Sodium: 141
Total Protein: 7.7

## 2011-03-23 LAB — LIPASE, BLOOD: Lipase: 31

## 2011-03-23 LAB — URINE MICROSCOPIC-ADD ON

## 2011-03-26 ENCOUNTER — Ambulatory Visit
Admission: RE | Admit: 2011-03-26 | Discharge: 2011-03-26 | Disposition: A | Discharge status: Home / Self Care | Payer: Medicare Other | Source: Ambulatory Visit | Attending: Internal Medicine | Admitting: Internal Medicine

## 2011-03-26 ENCOUNTER — Ambulatory Visit (INDEPENDENT_AMBULATORY_CARE_PROVIDER_SITE_OTHER): Payer: Medicare Other | Admitting: *Deleted

## 2011-03-26 ENCOUNTER — Encounter: Payer: Medicare Other | Admitting: *Deleted

## 2011-03-26 DIAGNOSIS — M549 Dorsalgia, unspecified: Secondary | ICD-10-CM

## 2011-03-26 DIAGNOSIS — I4891 Unspecified atrial fibrillation: Secondary | ICD-10-CM

## 2011-03-26 MED ORDER — METHYLPREDNISOLONE ACETATE 40 MG/ML INJ SUSP (RADIOLOG
120.0000 mg | Freq: Once | INTRAMUSCULAR | Status: AC
  Administered 2011-03-26: 120 mg via EPIDURAL

## 2011-03-26 MED ORDER — IOHEXOL 180 MG/ML  SOLN
1.0000 mL | Freq: Once | INTRAMUSCULAR | Status: AC | PRN
  Administered 2011-03-26: 1 mL via EPIDURAL

## 2011-03-26 NOTE — Patient Instructions (Addendum)
Post Procedure Spinal Discharge Instruction Sheet  1. You may resume a regular diet and any medications that you routinely take (including pain medications).  2. No driving day of procedure.  3. Light activity throughout the rest of the day.  Do not do any strenuous work, exercise, bending or lifting.  The day following the procedure, you can resume normal physical activity but you should refrain from exercising or physical therapy for at least three days thereafter.   Common Side Effects:   Headaches- take your usual medications as directed by your physician.  Increase your fluid intake.  Caffeinated beverages may be helpful.  Lie flat in bed until your headache resolves.   Restlessness or inability to sleep- you may have trouble sleeping for the next few days.  Ask your referring physician if you need any medication for sleep.   Facial flushing or redness- should subside within a few days.   Increased pain- a temporary increase in pain a day or two following your procedure is not unusual.  Take your pain medication as prescribed by your referring physician.   Leg cramps  Please contact our office at [phone] for the following symptoms:  Fever greater than 100 degrees.  Headaches unresolved with medication after 2-3 days.  Increased swelling, pain, or redness at injection site.  Thank you for visiting our office.   Resume coumadin today.

## 2011-03-30 ENCOUNTER — Encounter: Payer: Self-pay | Admitting: Internal Medicine

## 2011-03-30 ENCOUNTER — Ambulatory Visit (INDEPENDENT_AMBULATORY_CARE_PROVIDER_SITE_OTHER): Payer: Medicare Other | Admitting: Internal Medicine

## 2011-03-30 VITALS — BP 132/66 | HR 55 | Ht 66.0 in

## 2011-03-30 DIAGNOSIS — I2510 Atherosclerotic heart disease of native coronary artery without angina pectoris: Secondary | ICD-10-CM

## 2011-03-30 DIAGNOSIS — I359 Nonrheumatic aortic valve disorder, unspecified: Secondary | ICD-10-CM

## 2011-03-30 DIAGNOSIS — I10 Essential (primary) hypertension: Secondary | ICD-10-CM

## 2011-03-30 DIAGNOSIS — I4892 Unspecified atrial flutter: Secondary | ICD-10-CM

## 2011-03-30 DIAGNOSIS — I350 Nonrheumatic aortic (valve) stenosis: Secondary | ICD-10-CM

## 2011-03-30 DIAGNOSIS — W08XXXA Fall from other furniture, initial encounter: Secondary | ICD-10-CM | POA: Insufficient documentation

## 2011-03-30 NOTE — Assessment & Plan Note (Signed)
Today while ambulating into the office, he lost his balance and fell to the floor when trying to get onto the exam table.  He was caught by my assistant who helped him to the floor.  He reports landing against the exam table.  He reports that he hit his head lightly against the table.  He was observed for 30 minutes within our office without complaint of any injury.  He denies neck pain, headache, visial changes, confusion, or any other injury. He remained hemodynamically stable.  I did a full neurologic exam which did not reveal any acute changes.  He was instructed to contact our office or report to the ER if any symptoms develop.

## 2011-03-30 NOTE — Patient Instructions (Signed)
Your physician wants you to follow-up in: 6 months with Dr Jacquiline Doe will receive a reminder letter in the mail two months in advance. If you don't receive a letter, please call our office to schedule the follow-up appointment.  Your physician has recommended you make the following change in your medication:  1)Stop Amiodarone 2)Stop Lanoxin 3)Stop Diltiazem

## 2011-03-30 NOTE — Assessment & Plan Note (Signed)
As above.

## 2011-03-30 NOTE — Assessment & Plan Note (Signed)
Stable and asymptomatic Repeat echo upon return

## 2011-03-30 NOTE — Assessment & Plan Note (Signed)
Stable No change required today  

## 2011-03-30 NOTE — Progress Notes (Signed)
The patient presents today for routine electrophysiology followup. Since last being seen by Dr Deborah Chalk the patient reports doing very well.  He remains very active.  His primary concern is with ongoing back pain which is a chronic issue.  He underwent CTI ablation by Dr Ladona Ridgel 1/12.  He has done very well since that time.     Today, he denies symptoms of palpitations, chest pain, shortness of breath, orthopnea, PND, lower extremity edema, dizziness, presyncope, syncope, or neurologic sequela.  The patient feels that he is tolerating medications without difficulties and is otherwise without complaint today.   Past Medical History  Diagnosis Date  . Hypertension   . Hyperlipidemia   . Type II or unspecified type diabetes mellitus without mention of complication, not stated as uncontrolled   . Nephrolithiasis   . Atrial flutter     s/p CTI ablation by Dr Ladona Ridgel 1/12  . CAD (coronary artery disease)     s/p CABG 2003 by Dr Laneta Simmers  . Aortic stenosis, moderate   . DJD (degenerative joint disease)   . DDD (degenerative disc disease)   . Spinal stenosis of lumbar region    Past Surgical History  Procedure Date  . Cabg 2003    by Dr Laneta Simmers  . Anal fissure repair   . Leg surgery     right  . Trigger finger release     bilat.  . Tonsillectomy   . Back surgery 2010  . Atrial ablation surgery 1/12    atrial flutter ablation by Dr Ladona Ridgel    Current Outpatient Prescriptions  Medication Sig Dispense Refill  . budesonide-formoterol (SYMBICORT) 160-4.5 MCG/ACT inhaler Inhale 2 puffs into the lungs 2 (two) times daily.  1 Inhaler  2  . cilostazol (PLETAL) 50 MG tablet Take 50 mg by mouth 2 (two) times daily.       [name] doxazosin (CARDURA) 4 MG tablet Take 4 mg by mouth at bedtime.        . hydrochlorothiazide 25 MG tablet Take 25 mg by mouth daily.        [name] HYDROcodone-acetaminophen (NORCO) 5-325 MG per tablet Take 1 tablet by mouth as needed.       . insulin aspart (NOVOLOG) 100 UNIT/ML injection  Take as directed       . insulin detemir (LEVEMIR) 100 UNIT/ML injection Take as directed       . LORazepam (ATIVAN) 1 MG tablet Take 1-2 mg by mouth at bedtime as needed.        [name] losartan (COZAAR) 100 MG tablet Take 100 mg by mouth daily.       . metFORMIN (GLUCOPHAGE-XR) 500 MG 24 hr tablet Take 1,000 mg by mouth at bedtime.        . metoprolol (LOPRESSOR) 100 MG tablet Take 1 tablet (100 mg total) by mouth 2 (two) times daily.  180 tablet  3  . potassium chloride (K-DUR,KLOR-CON) 10 MEQ tablet Take 10 mEq by mouth daily.       . simvastatin (ZOCOR) 40 MG tablet Take 10 mg by mouth at bedtime.       . Tamsulosin HCl (FLOMAX) 0.4 MG CAPS Take 0.4 mg by mouth at bedtime.       [name] tiotropium (SPIRIVA HANDIHALER) 18 MCG inhalation capsule Place 1 capsule (18 mcg total) into inhaler and inhale daily.  30 capsule  2  . valsartan (DIOVAN) 320 MG tablet Take 320 mg by mouth daily.        [name]  venlafaxine (EFFEXOR) 75 MG tablet Take 150mg  in the morning and 75mg  at bedtime.      [name] warfarin (COUMADIN) 5 MG tablet Take as directed by [facility]. (Takes up to two tablets daily)  160 tablet  0  . zolpidem (AMBIEN) 5 MG tablet Take 5-10 mg by mouth at bedtime as needed.          Allergies  Allergen Reactions  . Doxycycline     REACTION: swollen tongue    History   Social History  . Marital Status: Married    Spouse Name: N/A    Number of Children: Y  . Years of Education: N/A   Occupational History  . pharmacist     retired   Social History Main Topics  . Smoking status: Former Smoker -- 1.0 packs/day for 49 years    Quit date: 08/21/2001  . Smokeless tobacco: Not on file   Comment: started at age 58.   [name] Alcohol Use: No  . Drug Use: No  . Sexually Active: Not on file   Other Topics Concern  . Not on file   Social History Narrative    He is a retired Teacher, early years/pre.    Family History  Problem Relation Age of Onset  . Heart disease Father   . Heart disease Paternal Grandfather     . Stroke Mother   . Rheum arthritis Mother     Physical Exam: Filed Vitals:   03/30/11 1130  BP: 132/66  Pulse: 55  Height: 5\' 6"  (1.676 m)    GEN- The patient is elderly appearing, alert and oriented x 3 today.   Head- normocephalic, atraumatic Eyes-  Sclera clear, conjunctiva pink Ears- hearing intact Oropharynx- clear Neck- supple, no JVP Lymph- no cervical lymphadenopathy Lungs- Clear to ausculation bilaterally, normal work of breathing Heart- Regular rate and rhythm,  2/6 SEM LUSB (mid peaking) GI- soft, NT, ND, + BS Extremities- no clubbing, cyanosis, or edema MS- no significant deformity or atrophy Skin- no rash or lesion Psych- euthymic mood, full affect Neuro- strength and sensation are intact  ekg today reveals sinus rhythm 55 bpm, PR 223, RBBB, LAHB  Assessment and Plan:

## 2011-03-30 NOTE — Assessment & Plan Note (Signed)
Doing well without any further atrial arrhythmias since his ablation.  He remains on amiodarone and coumadin. Per my prior consult note 1/12, he has had post op afib previously.  Afib is otherwise not well documented. At this time, I will stop amiodarone, digoxin, and diltiazem.  If he has had no afib upon return, then we could consider stopping coumadin at that time and also reducing his beta blocker dose.

## 2011-04-06 ENCOUNTER — Encounter: Payer: Medicare Other | Admitting: *Deleted

## 2011-04-10 ENCOUNTER — Ambulatory Visit (INDEPENDENT_AMBULATORY_CARE_PROVIDER_SITE_OTHER): Payer: Medicare Other | Admitting: *Deleted

## 2011-04-10 DIAGNOSIS — I4891 Unspecified atrial fibrillation: Secondary | ICD-10-CM

## 2011-04-29 ENCOUNTER — Ambulatory Visit (INDEPENDENT_AMBULATORY_CARE_PROVIDER_SITE_OTHER): Payer: Medicare Other | Admitting: *Deleted

## 2011-04-29 DIAGNOSIS — I4891 Unspecified atrial fibrillation: Secondary | ICD-10-CM

## 2011-05-20 ENCOUNTER — Ambulatory Visit (INDEPENDENT_AMBULATORY_CARE_PROVIDER_SITE_OTHER): Payer: Medicare Other | Admitting: *Deleted

## 2011-05-20 DIAGNOSIS — I4891 Unspecified atrial fibrillation: Secondary | ICD-10-CM

## 2011-05-29 ENCOUNTER — Other Ambulatory Visit: Payer: Self-pay | Admitting: Internal Medicine

## 2011-06-01 ENCOUNTER — Ambulatory Visit (INDEPENDENT_AMBULATORY_CARE_PROVIDER_SITE_OTHER): Payer: Medicare Other | Admitting: *Deleted

## 2011-06-01 DIAGNOSIS — I4891 Unspecified atrial fibrillation: Secondary | ICD-10-CM

## 2011-06-01 MED ORDER — WARFARIN SODIUM 5 MG PO TABS: 5.0000 mg | ORAL_TABLET | ORAL | Status: DC

## 2011-06-09 ENCOUNTER — Ambulatory Visit (INDEPENDENT_AMBULATORY_CARE_PROVIDER_SITE_OTHER): Payer: Medicare Other | Admitting: Pulmonary Disease

## 2011-06-09 ENCOUNTER — Encounter: Payer: Self-pay | Admitting: Pulmonary Disease

## 2011-06-09 VITALS — BP 116/60 | HR 86 | Temp 98.3°F | Ht 66.0 in | Wt 190.2 lb

## 2011-06-09 DIAGNOSIS — J438 Other emphysema: Secondary | ICD-10-CM

## 2011-06-09 NOTE — Patient Instructions (Signed)
No change in meds. Stay as active as possible. Get back on your oxygen at night. followup with me in 6mos.

## 2011-06-09 NOTE — Assessment & Plan Note (Signed)
The patient is stable from a pulmonary standpoint on his current dilator regimen.  I've asked him to continue with this, work on weight loss and conditioning, and to get back on his nocturnal oxygen.  I discussed with him pulmonary hypertension associated with nocturnal hypoxemia.  He will followup with me in 6 months, or sooner if having issues.

## 2011-06-09 NOTE — Progress Notes (Signed)
  Subjective:    Patient ID: Adam [name], male    DOB: 09-22-1934, 76 y.o.   MRN: [id number]  HPI Patient comes in today for followup of his known COPD.  He has been continuing on his bronchodilator regimen, and has not had an acute exacerbation or pulmonary infection since last visit.  He did stop using his oxygen at night, and I have educated him on the importance of continuing this.   Review of Systems  Constitutional: Negative for fever and unexpected weight change.  HENT: Positive for trouble swallowing. Negative for ear pain, nosebleeds, congestion, sore throat, rhinorrhea, sneezing, dental problem, postnasal drip and sinus pressure.   Eyes: Negative for redness and itching.  Respiratory: Negative for cough, chest tightness, shortness of breath and wheezing.   Cardiovascular: Positive for palpitations and leg swelling.  Gastrointestinal: Negative for nausea and vomiting.  Genitourinary: Positive for difficulty urinating. Negative for dysuria.  Musculoskeletal: Negative for joint swelling.  Skin: Negative for rash.  Neurological: Positive for headaches.  Hematological: Bruises/bleeds easily.  Psychiatric/Behavioral: Positive for dysphoric mood. The patient is nervous/anxious.        Objective:   Physical Exam Obese male in no acute distress Nose without purulence or discharge noted Chest with mild decrease in breath sounds, no wheezes Cardiac exam with slightly irregular rhythm, controlled ventricular response Lower extremities with no significant edema, no cyanosis noted Alert and oriented, moves all 4 extremities.      Assessment & Plan:

## 2011-06-19 ENCOUNTER — Ambulatory Visit (INDEPENDENT_AMBULATORY_CARE_PROVIDER_SITE_OTHER): Payer: Medicare Other | Admitting: *Deleted

## 2011-06-19 DIAGNOSIS — I4891 Unspecified atrial fibrillation: Secondary | ICD-10-CM

## 2011-06-19 LAB — POCT INR: INR: 2.4

## 2011-07-01 ENCOUNTER — Encounter: Payer: Medicare Other | Admitting: *Deleted

## 2011-07-17 ENCOUNTER — Ambulatory Visit (INDEPENDENT_AMBULATORY_CARE_PROVIDER_SITE_OTHER): Payer: Medicare Other

## 2011-07-17 DIAGNOSIS — I4891 Unspecified atrial fibrillation: Secondary | ICD-10-CM

## 2011-07-17 LAB — POCT INR: INR: 1.7

## 2011-08-18 ENCOUNTER — Ambulatory Visit (INDEPENDENT_AMBULATORY_CARE_PROVIDER_SITE_OTHER): Payer: Medicare Other | Admitting: *Deleted

## 2011-08-18 DIAGNOSIS — I4891 Unspecified atrial fibrillation: Secondary | ICD-10-CM

## 2011-08-18 LAB — POCT INR: INR: 2.1

## 2011-09-15 ENCOUNTER — Ambulatory Visit (INDEPENDENT_AMBULATORY_CARE_PROVIDER_SITE_OTHER): Payer: Medicare Other

## 2011-09-15 DIAGNOSIS — I4891 Unspecified atrial fibrillation: Secondary | ICD-10-CM

## 2011-09-15 LAB — POCT INR: INR: 2.4

## 2011-10-01 ENCOUNTER — Other Ambulatory Visit: Payer: Self-pay | Admitting: Nurse Practitioner

## 2011-10-01 MED ORDER — POTASSIUM CHLORIDE CRYS ER 10 MEQ PO TBCR: 10.0000 meq | EXTENDED_RELEASE_TABLET | Freq: Every day | ORAL | Status: DC

## 2011-10-05 ENCOUNTER — Ambulatory Visit (INDEPENDENT_AMBULATORY_CARE_PROVIDER_SITE_OTHER): Payer: Medicare Other | Admitting: Pharmacist

## 2011-10-05 ENCOUNTER — Encounter: Payer: Self-pay | Admitting: Internal Medicine

## 2011-10-05 ENCOUNTER — Ambulatory Visit (INDEPENDENT_AMBULATORY_CARE_PROVIDER_SITE_OTHER): Payer: Medicare Other | Admitting: Internal Medicine

## 2011-10-05 VITALS — BP 134/58 | HR 76 | Resp 18 | Ht 62.0 in | Wt 201.4 lb

## 2011-10-05 DIAGNOSIS — E785 Hyperlipidemia, unspecified: Secondary | ICD-10-CM

## 2011-10-05 DIAGNOSIS — I4891 Unspecified atrial fibrillation: Secondary | ICD-10-CM

## 2011-10-05 DIAGNOSIS — I10 Essential (primary) hypertension: Secondary | ICD-10-CM

## 2011-10-05 DIAGNOSIS — I359 Nonrheumatic aortic valve disorder, unspecified: Secondary | ICD-10-CM

## 2011-10-05 DIAGNOSIS — I2510 Atherosclerotic heart disease of native coronary artery without angina pectoris: Secondary | ICD-10-CM

## 2011-10-05 DIAGNOSIS — I350 Nonrheumatic aortic (valve) stenosis: Secondary | ICD-10-CM

## 2011-10-05 LAB — POCT INR: INR: 2.2

## 2011-10-05 MED ORDER — VALSARTAN 320 MG PO TABS: 320.0000 mg | ORAL_TABLET | Freq: Every day | ORAL | Status: DC

## 2011-10-05 MED ORDER — POTASSIUM CHLORIDE CRYS ER 10 MEQ PO TBCR: 10.0000 meq | EXTENDED_RELEASE_TABLET | Freq: Every day | ORAL | Status: DC

## 2011-10-05 MED ORDER — LOSARTAN POTASSIUM 100 MG PO TABS: 100.0000 mg | ORAL_TABLET | Freq: Every day | ORAL | Status: DC

## 2011-10-05 MED ORDER — CILOSTAZOL 50 MG PO TABS: 50.0000 mg | ORAL_TABLET | Freq: Two times a day (BID) | ORAL | Status: DC

## 2011-10-05 MED ORDER — METOPROLOL TARTRATE 100 MG PO TABS: 50.0000 mg | ORAL_TABLET | Freq: Two times a day (BID) | ORAL | Status: DC

## 2011-10-05 MED ORDER — HYDROCHLOROTHIAZIDE 25 MG PO TABS: 12.5000 mg | ORAL_TABLET | Freq: Every day | ORAL | Status: DC

## 2011-10-05 MED ORDER — SIMVASTATIN 10 MG PO TABS: 10.0000 mg | ORAL_TABLET | Freq: Every day | ORAL | Status: DC

## 2011-10-05 NOTE — Assessment & Plan Note (Signed)
Repeat echo at this time.

## 2011-10-05 NOTE — Assessment & Plan Note (Signed)
Stable No change required today  

## 2011-10-05 NOTE — Assessment & Plan Note (Signed)
Doing well without any further atrial arrhythmias since his ablation.  He remains on coumadin. Per my prior consult note 1/12, he has had post op afib previously.  Afib is otherwise not well documented.  He will return in 6 months.  IF he has had no afib at that time, we will place an event monitor.  If no further afib is documented, then we could consider stopping coumadin at that time.  I will reduce metoprool to 50mg  BID today.

## 2011-10-05 NOTE — Assessment & Plan Note (Signed)
Stable  He has multiple medications which could cause his postural dizziness.  I will decrease hctz to 12.5mg  daily today. Decrease metoprolol also.  If episodes worsen, he may benefit from switching off of flomax to finasteride.

## 2011-10-05 NOTE — Progress Notes (Signed)
The patient presents today for routine electrophysiology followup. He remains very active.   He has occasional postural dizziness.  Today, he denies symptoms of palpitations, chest pain, shortness of breath, orthopnea, PND, lower extremity edema,  presyncope, syncope, or neurologic sequela.  The patient feels that he is tolerating medications without difficulties and is otherwise without complaint today.   Past Medical History  Diagnosis Date  . Hypertension   . Hyperlipidemia   . Type II or unspecified type diabetes mellitus without mention of complication, not stated as uncontrolled   . Nephrolithiasis   . Atrial flutter     s/p CTI ablation by Dr Ladona Ridgel 1/12  . CAD (coronary artery disease)     s/p CABG 2003 by Dr Laneta Simmers  . Aortic stenosis, moderate   . DJD (degenerative joint disease)   . DDD (degenerative disc disease)   . Spinal stenosis of lumbar region   . Bifascicular block    Past Surgical History  Procedure Date  . Cabg 2003    by Dr Laneta Simmers  . Anal fissure repair   . Leg surgery     right  . Trigger finger release     bilat.  . Tonsillectomy   . Back surgery 2010  . Atrial ablation surgery 1/12    atrial flutter ablation by Dr Ladona Ridgel    Current Outpatient Prescriptions  Medication Sig Dispense Refill  . budesonide-formoterol (SYMBICORT) 160-4.5 MCG/ACT inhaler Inhale 2 puffs into the lungs 2 (two) times daily.  1 Inhaler  2  . cilostazol (PLETAL) 50 MG tablet Take 50 mg by mouth 2 (two) times daily.       [name] doxazosin (CARDURA) 4 MG tablet Take 4 mg by mouth at bedtime.        . hydrochlorothiazide 25 MG tablet Take 25 mg by mouth daily.        [name] HYDROcodone-acetaminophen (NORCO) 5-325 MG per tablet Take 1 tablet by mouth as needed.       . insulin aspart (NOVOLOG) 100 UNIT/ML injection Take as directed       . insulin detemir (LEVEMIR) 100 UNIT/ML injection Take as directed       . LORazepam (ATIVAN) 1 MG tablet Take 1-2 mg by mouth at bedtime as needed.        [name]  losartan (COZAAR) 100 MG tablet Take 100 mg by mouth daily.       . metFORMIN (GLUCOPHAGE-XR) 500 MG 24 hr tablet Take 1,000 mg by mouth at bedtime.        . metoprolol (LOPRESSOR) 100 MG tablet Take 1 tablet (100 mg total) by mouth 2 (two) times daily.  180 tablet  3  . potassium chloride (K-DUR,KLOR-CON) 10 MEQ tablet Take 1 tablet (10 mEq total) by mouth daily.  90 tablet  2  . simvastatin (ZOCOR) 10 MG tablet Take 10 mg by mouth at bedtime.      . Tamsulosin HCl (FLOMAX) 0.4 MG CAPS Take 0.4 mg by mouth at bedtime.       [name] tiotropium (SPIRIVA HANDIHALER) 18 MCG inhalation capsule Place 1 capsule (18 mcg total) into inhaler and inhale daily.  30 capsule  2  . valsartan (DIOVAN) 320 MG tablet Take 320 mg by mouth daily.        [name] venlafaxine (EFFEXOR) 75 MG tablet Take 150mg  in the morning and 75mg  at bedtime.      [name] warfarin (COUMADIN) 5 MG tablet Take 1 tablet (5 mg total) by mouth as directed.  180 tablet  1  . zolpidem (AMBIEN) 5 MG tablet Take 5-10 mg by mouth at bedtime as needed.          Allergies  Allergen Reactions  . Doxycycline     REACTION: swollen tongue    History   Social History  . Marital Status: Married    Spouse Name: N/A    Number of Children: Y  . Years of Education: N/A   Occupational History  . pharmacist     retired   Social History Main Topics  . Smoking status: Former Smoker -- 1.0 packs/day for 49 years    Quit date: 08/21/2001  . Smokeless tobacco: Not on file   Comment: started at age 62.   [name] Alcohol Use: No  . Drug Use: No  . Sexually Active: Not on file   Other Topics Concern  . Not on file   Social History Narrative    He is a retired Teacher, early years/pre.    Family History  Problem Relation Age of Onset  . Heart disease Father   . Heart disease Paternal Grandfather   . Stroke Mother   . Rheum arthritis Mother     Physical Exam: Filed Vitals:   10/05/11 1241  BP: 134/58  Pulse: 76  Resp: 18  Height: 5\' 2"  (1.575 m)  Weight: 201 lb  6.4 oz (91.354 kg)    GEN- The patient is elderly appearing, alert and oriented x 3 today.   Head- normocephalic, atraumatic Eyes-  Sclera clear, conjunctiva pink Ears- hearing intact Oropharynx- clear Neck- supple, no JVP Lymph- no cervical lymphadenopathy Lungs- Clear to ausculation bilaterally, normal work of breathing Heart- Regular rate and rhythm,  2/6 SEM LUSB (mid peaking) GI- soft, NT, ND, + BS Extremities- no clubbing, cyanosis, or edema MS- no significant deformity or atrophy Skin- no rash or lesion Psych- euthymic mood, full affect Neuro- strength and sensation are intact  ekg today reveals sinus rhythm 76 bpm, PR 194, RBBB, LAHB  Assessment and Plan:

## 2011-10-05 NOTE — Patient Instructions (Addendum)
Your physician wants you to follow-up in: 6 months with Sunday Spillers and 12 months with Dr Jacquiline Doe will receive a reminder letter in the mail two months in advance. If you don't receive a letter, please call our office to schedule the follow-up appointment.   Your physician has requested that you have an echocardiogram. Echocardiography is a painless test that uses sound waves to create images of your heart. It provides your doctor with information about the size and shape of your heart and how well your heart's chambers and valves are working. This procedure takes approximately one hour. There are no restrictions for this procedure.  Your physician has recommended you make the following change in your medication:  1) Decrease HCTZ to 12.5mg  daily 2) Decrease Metoprolol to 50mg  twice daily

## 2011-10-15 ENCOUNTER — Ambulatory Visit (HOSPITAL_COMMUNITY): Payer: Medicare Other | Attending: Cardiovascular Disease

## 2011-10-15 ENCOUNTER — Other Ambulatory Visit: Payer: Self-pay

## 2011-10-15 ENCOUNTER — Other Ambulatory Visit (HOSPITAL_COMMUNITY): Payer: Self-pay | Admitting: Internal Medicine

## 2011-10-15 DIAGNOSIS — I2510 Atherosclerotic heart disease of native coronary artery without angina pectoris: Secondary | ICD-10-CM | POA: Insufficient documentation

## 2011-10-15 DIAGNOSIS — I4891 Unspecified atrial fibrillation: Secondary | ICD-10-CM | POA: Insufficient documentation

## 2011-10-15 DIAGNOSIS — E785 Hyperlipidemia, unspecified: Secondary | ICD-10-CM | POA: Insufficient documentation

## 2011-10-15 DIAGNOSIS — E669 Obesity, unspecified: Secondary | ICD-10-CM | POA: Insufficient documentation

## 2011-10-15 DIAGNOSIS — E119 Type 2 diabetes mellitus without complications: Secondary | ICD-10-CM | POA: Insufficient documentation

## 2011-10-15 DIAGNOSIS — R0609 Other forms of dyspnea: Secondary | ICD-10-CM | POA: Insufficient documentation

## 2011-10-15 DIAGNOSIS — I4892 Unspecified atrial flutter: Secondary | ICD-10-CM | POA: Insufficient documentation

## 2011-10-15 DIAGNOSIS — J438 Other emphysema: Secondary | ICD-10-CM | POA: Insufficient documentation

## 2011-10-15 DIAGNOSIS — R0989 Other specified symptoms and signs involving the circulatory and respiratory systems: Secondary | ICD-10-CM | POA: Insufficient documentation

## 2011-10-15 DIAGNOSIS — I10 Essential (primary) hypertension: Secondary | ICD-10-CM | POA: Insufficient documentation

## 2011-10-15 DIAGNOSIS — I350 Nonrheumatic aortic (valve) stenosis: Secondary | ICD-10-CM

## 2011-10-15 DIAGNOSIS — I359 Nonrheumatic aortic valve disorder, unspecified: Secondary | ICD-10-CM | POA: Insufficient documentation

## 2011-10-15 DIAGNOSIS — Z87891 Personal history of nicotine dependence: Secondary | ICD-10-CM | POA: Insufficient documentation

## 2011-10-26 ENCOUNTER — Telehealth: Payer: Self-pay | Admitting: Internal Medicine

## 2011-10-26 NOTE — Telephone Encounter (Signed)
Fu call °Pt returning your call  °

## 2011-10-26 NOTE — Telephone Encounter (Signed)
Notes Recorded by Deliah Boston, RN on 10/23/2011 at 4:36 PM lmom for pt to call us to discuss results of echo ------  Notes Recorded by [name], MD on 10/15/2011 at 10:15 PM Results reviewed. Please inform pt of result. He has moderate to severe AS. If he becomes symptomatic then we will consider surgical referral at that time.  10/26/11--Spoke with pt and discussed results with pt. Pt did mention that he has been paying more attention to his breathing since OV with Dr Johney Frame and would like to discuss with Tresa Endo further. He does have a history of COPD. I will forward to [place].

## 2011-10-27 NOTE — Telephone Encounter (Signed)
Spoke with patient and let him know to call us if he has symptoms of dizziness and SOB(that is worse) than his normal with COPD.  He states his SOB is better with his treatment for COPD.  He will call if things change

## 2011-11-18 ENCOUNTER — Ambulatory Visit (INDEPENDENT_AMBULATORY_CARE_PROVIDER_SITE_OTHER): Payer: Medicare Other | Admitting: *Deleted

## 2011-11-18 DIAGNOSIS — I4891 Unspecified atrial fibrillation: Secondary | ICD-10-CM

## 2011-11-19 ENCOUNTER — Other Ambulatory Visit: Payer: Self-pay | Admitting: *Deleted

## 2011-11-19 DIAGNOSIS — I350 Nonrheumatic aortic (valve) stenosis: Secondary | ICD-10-CM

## 2011-11-19 MED ORDER — METOPROLOL TARTRATE 100 MG PO TABS: 50.0000 mg | ORAL_TABLET | Freq: Two times a day (BID) | ORAL | Status: DC

## 2011-11-30 ENCOUNTER — Other Ambulatory Visit: Payer: Self-pay

## 2011-11-30 MED ORDER — WARFARIN SODIUM 5 MG PO TABS: 5.0000 mg | ORAL_TABLET | ORAL | Status: DC

## 2011-12-07 ENCOUNTER — Other Ambulatory Visit: Payer: Self-pay

## 2011-12-07 DIAGNOSIS — I350 Nonrheumatic aortic (valve) stenosis: Secondary | ICD-10-CM

## 2011-12-07 MED ORDER — METOPROLOL TARTRATE 100 MG PO TABS: 50.0000 mg | ORAL_TABLET | Freq: Two times a day (BID) | ORAL | Status: DC

## 2011-12-08 ENCOUNTER — Ambulatory Visit (INDEPENDENT_AMBULATORY_CARE_PROVIDER_SITE_OTHER): Payer: Medicare Other | Admitting: Pulmonary Disease

## 2011-12-08 ENCOUNTER — Encounter: Payer: Self-pay | Admitting: Pulmonary Disease

## 2011-12-08 VITALS — BP 126/52 | HR 79 | Temp 97.7°F | Ht 66.0 in | Wt 204.0 lb

## 2011-12-08 DIAGNOSIS — J438 Other emphysema: Secondary | ICD-10-CM

## 2011-12-08 NOTE — Assessment & Plan Note (Signed)
The patient is maintaining a stable baseline on his current regimen.  I've asked him to continue on his bronchodilators, and to work aggressively on weight loss and some type of exercise program.  If he is doing well, he is to followup with me in 6 months.

## 2011-12-08 NOTE — Progress Notes (Signed)
  Subjective:    Patient ID: Adam [name], male    DOB: 1935-04-27, 77 y.o.   MRN: [id number]  HPI Patient comes in today for followup of his known COPD.  He is staying compliant with his medications, and feels that his breathing is near his usual baseline.  He has not had any acute exacerbation or pulmonary infection since his last visit.  Unfortunately, he has gained 14 pounds since last visit, and I stressed to him the importance of working on weight loss.   Review of Systems  Constitutional: Negative.  Negative for fever and unexpected weight change.  HENT: Positive for rhinorrhea, sneezing and postnasal drip. Negative for ear pain, nosebleeds, congestion, sore throat, trouble swallowing, dental problem and sinus pressure.   Eyes: Negative.  Negative for redness and itching.  Respiratory: Positive for cough and shortness of breath. Negative for chest tightness and wheezing.   Cardiovascular: Negative.  Negative for palpitations and leg swelling.  Gastrointestinal: Negative.  Negative for nausea and vomiting.  Genitourinary: Negative.  Negative for dysuria.  Musculoskeletal: Negative.  Negative for joint swelling.  Skin: Negative.  Negative for rash.  Neurological: Negative.  Negative for headaches.  Hematological: Negative.  Does not bruise/bleed easily.  Psychiatric/Behavioral: Negative.  Negative for dysphoric mood. The patient is not nervous/anxious.        Objective:   Physical Exam Obese male in no acute distress Nose without purulence or discharge noted Chest with mild decrease in breath sounds, few basilar crackles, no wheezing Cardiac exam with regular rate and rhythm, 3/6 systolic murmur Lower extremities without significant edema, no cyanosis Alert and oriented, moves all 4 extremities.       Assessment & Plan:

## 2011-12-08 NOTE — Patient Instructions (Addendum)
No change in current medications Try and work on weight reduction and some type of exercise program If doing well, follow up with me in 6mos.

## 2011-12-09 ENCOUNTER — Ambulatory Visit (INDEPENDENT_AMBULATORY_CARE_PROVIDER_SITE_OTHER): Payer: Medicare Other | Admitting: *Deleted

## 2011-12-09 DIAGNOSIS — I4891 Unspecified atrial fibrillation: Secondary | ICD-10-CM

## 2012-01-06 ENCOUNTER — Ambulatory Visit (INDEPENDENT_AMBULATORY_CARE_PROVIDER_SITE_OTHER): Payer: Medicare Other | Admitting: Pharmacist

## 2012-01-06 DIAGNOSIS — I4891 Unspecified atrial fibrillation: Secondary | ICD-10-CM

## 2012-01-06 LAB — POCT INR: INR: 2.3

## 2012-02-03 ENCOUNTER — Ambulatory Visit (INDEPENDENT_AMBULATORY_CARE_PROVIDER_SITE_OTHER): Payer: Medicare Other | Admitting: Pharmacist

## 2012-02-03 DIAGNOSIS — I4891 Unspecified atrial fibrillation: Secondary | ICD-10-CM

## 2012-02-03 LAB — POCT INR: INR: 2.5

## 2012-03-02 ENCOUNTER — Ambulatory Visit (INDEPENDENT_AMBULATORY_CARE_PROVIDER_SITE_OTHER): Payer: Medicare Other | Admitting: *Deleted

## 2012-03-02 DIAGNOSIS — I4891 Unspecified atrial fibrillation: Secondary | ICD-10-CM

## 2012-03-02 LAB — POCT INR: INR: 2.9

## 2012-04-06 ENCOUNTER — Encounter: Payer: Self-pay | Admitting: Nurse Practitioner

## 2012-04-06 ENCOUNTER — Ambulatory Visit (INDEPENDENT_AMBULATORY_CARE_PROVIDER_SITE_OTHER): Payer: Medicare Other | Admitting: Nurse Practitioner

## 2012-04-06 ENCOUNTER — Encounter: Payer: Self-pay | Admitting: *Deleted

## 2012-04-06 ENCOUNTER — Other Ambulatory Visit: Payer: Self-pay | Admitting: Nurse Practitioner

## 2012-04-06 ENCOUNTER — Ambulatory Visit (INDEPENDENT_AMBULATORY_CARE_PROVIDER_SITE_OTHER): Payer: Medicare Other | Admitting: Pharmacist

## 2012-04-06 ENCOUNTER — Ambulatory Visit
Admission: RE | Admit: 2012-04-06 | Discharge: 2012-04-06 | Disposition: A | Discharge status: Home / Self Care | Payer: Medicare Other | Source: Ambulatory Visit | Attending: Nurse Practitioner | Admitting: Nurse Practitioner

## 2012-04-06 VITALS — BP 132/64 | HR 120 | Ht 66.0 in | Wt 198.0 lb

## 2012-04-06 DIAGNOSIS — R0989 Other specified symptoms and signs involving the circulatory and respiratory systems: Secondary | ICD-10-CM

## 2012-04-06 DIAGNOSIS — Z7901 Long term (current) use of anticoagulants: Secondary | ICD-10-CM

## 2012-04-06 DIAGNOSIS — I359 Nonrheumatic aortic valve disorder, unspecified: Secondary | ICD-10-CM

## 2012-04-06 DIAGNOSIS — Z01810 Encounter for preprocedural cardiovascular examination: Secondary | ICD-10-CM

## 2012-04-06 DIAGNOSIS — I350 Nonrheumatic aortic (valve) stenosis: Secondary | ICD-10-CM

## 2012-04-06 DIAGNOSIS — Z79899 Other long term (current) drug therapy: Secondary | ICD-10-CM

## 2012-04-06 DIAGNOSIS — Z01818 Encounter for other preprocedural examination: Secondary | ICD-10-CM

## 2012-04-06 DIAGNOSIS — I4892 Unspecified atrial flutter: Secondary | ICD-10-CM

## 2012-04-06 DIAGNOSIS — I4891 Unspecified atrial fibrillation: Secondary | ICD-10-CM

## 2012-04-06 LAB — CBC WITH DIFFERENTIAL/PLATELET
Basophils Absolute: 0 10*3/uL (ref 0.0–0.1)
Basophils Relative: 0.4 % (ref 0.0–3.0)
Eosinophils Absolute: 0.3 10*3/uL (ref 0.0–0.7)
Eosinophils Relative: 3.8 % (ref 0.0–5.0)
HCT: 44.4 % (ref 39.0–52.0)
Hemoglobin: 14.6 g/dL (ref 13.0–17.0)
Lymphocytes Relative: 21.1 % (ref 12.0–46.0)
Lymphs Abs: 1.7 10*3/uL (ref 0.7–4.0)
MCHC: 32.9 g/dL (ref 30.0–36.0)
MCV: 87.4 fl (ref 78.0–100.0)
Monocytes Absolute: 0.8 10*3/uL (ref 0.1–1.0)
Monocytes Relative: 9.6 % (ref 3.0–12.0)
Neutro Abs: 5.1 10*3/uL (ref 1.4–7.7)
Neutrophils Relative %: 65.1 % (ref 43.0–77.0)
Platelets: 313 10*3/uL (ref 150.0–400.0)
RBC: 5.08 Mil/uL (ref 4.22–5.81)
RDW: 13.9 % (ref 11.5–14.6)
WBC: 7.8 10*3/uL (ref 4.5–10.5)

## 2012-04-06 LAB — BASIC METABOLIC PANEL
BUN: 16 mg/dL (ref 6–23)
CO2: 28 mEq/L (ref 19–32)
Calcium: 9.3 mg/dL (ref 8.4–10.5)
Chloride: 102 mEq/L (ref 96–112)
Creatinine, Ser: 0.8 mg/dL (ref 0.4–1.5)
GFR: 100.12 mL/min (ref >60.00)
Glucose, Bld: 203 mg/dL — ABNORMAL HIGH (ref 70–99)
Potassium: 4 mEq/L (ref 3.5–5.1)
Sodium: 139 mEq/L (ref 135–145)

## 2012-04-06 LAB — POCT INR: INR: 2.7

## 2012-04-06 LAB — APTT: aPTT: 46.6 s — ABNORMAL HIGH (ref 21.7–28.8)

## 2012-04-06 MED ORDER — METOPROLOL TARTRATE 100 MG PO TABS: 100.0000 mg | ORAL_TABLET | Freq: Two times a day (BID) | ORAL | Status: DC

## 2012-04-06 NOTE — Patient Instructions (Addendum)
We are going to arrange for a cardioversion. They will first try some medicine to see if your rhythm is actually atrial flutter  Increase the Metoprolol to a whole tablet two times a day  We need to check labs today  Go to [facility] at the [facility] Building to the first floor. You just walk in   We will get an ultrasound of your heart next week  We need to get an appointment with Dr. Johney Frame in the next 2 weeks or so.  You are scheduled for a cardioversion on Thursday at 12 noon with Dr. Eden Emms or associates. Please go to [facility] 2nd Floor Short Stay at 10:30 am.  Do not have any food or drink after midnight tonight.  You may take your medicines with a sip of water on the day of your procedure. Except do not take your diabetic medicines tomorrow. Take only half dose of insulin tonight.   You will need someone to drive you home following your procedure.    Call the [facility] office at [phone] if you have any questions, problems or concerns.

## 2012-04-06 NOTE — Addendum Note (Signed)
Addended by: Reine Just on: 04/06/2012 02:49 PM   Modules accepted: Orders

## 2012-04-06 NOTE — Progress Notes (Signed)
Adam [name] Date of Birth: 1935-05-29 Medical Record #[id number]  History of Present Illness: Mr. Madole is seen back today for his 6 month check. He is seen for Dr. Johney Frame. He is a former patient of Dr. Ronnald Nian. He has known CAD with prior CABG back in 2003, last stress study in 2008. Has moderate to severe AS and had an echo earlier this year. Has had atrial flutter which required ablation back in 2012. He remains on his coumadin. His other issues include spinal stenosis, DM, HLD and HTN. He is a retired Teacher, early years/pre.  He was last seen 6 months ago by Dr. Johney Frame. Echo was ordered. Metoprolol was cut back at that visit. Dr. Jenel Lucks plan was that if he had not had recurrent atrial flutter, then we would place an event monitor and consider stopping coumadin.   He comes in today. He is here alone. He says he is not quite sure how he feels. He seems more upset about his echo results from April. He is not having chest pain. He has some mild dyspnea but nothing out of character for him. No syncope. He is not really active and really hasn't been as a general rule. His dizziness has improved with med changes from his last visit. He does not check his blood pressure at home. Does not have any palpitations. No awareness of his heart rate or rhythm.   Current Outpatient Prescriptions on File Prior to Visit  Medication Sig Dispense Refill  . budesonide-formoterol (SYMBICORT) 160-4.5 MCG/ACT inhaler Inhale 2 puffs into the lungs 2 (two) times daily.      [name] doxazosin (CARDURA) 4 MG tablet Take 4 mg by mouth at bedtime.        . hydrochlorothiazide (HYDRODIURIL) 25 MG tablet Take 0.5 tablets (12.5 mg total) by mouth daily.  45 tablet  3  . HYDROcodone-acetaminophen (NORCO) 5-325 MG per tablet Take 1 tablet by mouth as needed.       . insulin aspart (NOVOLOG) 100 UNIT/ML injection Take as directed       . insulin detemir (LEVEMIR) 100 UNIT/ML injection Take as directed       . LORazepam (ATIVAN) 1 MG  tablet Take 1-2 mg by mouth at bedtime as needed.        [name] losartan (COZAAR) 100 MG tablet Take 1 tablet (100 mg total) by mouth daily.  90 tablet  3  . metFORMIN (GLUCOPHAGE-XR) 500 MG 24 hr tablet Take 1,000 mg by mouth at bedtime.        . potassium chloride (K-DUR,KLOR-CON) 10 MEQ tablet Take 1 tablet (10 mEq total) by mouth daily.  90 tablet  3  . simvastatin (ZOCOR) 10 MG tablet Take 1 tablet (10 mg total) by mouth at bedtime.  90 tablet  3  . Tamsulosin HCl (FLOMAX) 0.4 MG CAPS Take 0.4 mg by mouth at bedtime.       . valsartan (DIOVAN) 320 MG tablet Take 1 tablet (320 mg total) by mouth daily.  90 tablet  3  . venlafaxine (EFFEXOR) 75 MG tablet Take 150mg  in the morning and 75mg  at bedtime.      [name] warfarin (COUMADIN) 5 MG tablet Take 1 tablet (5 mg total) by mouth as directed.  180 tablet  1  . zolpidem (AMBIEN) 5 MG tablet Take 5-10 mg by mouth at bedtime as needed.        [name] DISCONTD: metoprolol (LOPRESSOR) 100 MG tablet Take 0.5 tablets (50 mg total) by mouth 2 (  two) times daily.  90 tablet  3    Allergies  Allergen Reactions  . Doxycycline     REACTION: swollen tongue    Past Medical History  Diagnosis Date  . Hypertension   . Hyperlipidemia   . Type II or unspecified type diabetes mellitus without mention of complication, not stated as uncontrolled   . Nephrolithiasis   . Atrial flutter     s/p CTI ablation by Dr Ladona Ridgel 1/12  . CAD (coronary artery disease)     s/p CABG 2003 by Dr Laneta Simmers  . Aortic stenosis, moderate     moderate to severe per echo 09/2011  . DJD (degenerative joint disease)   . DDD (degenerative disc disease)   . Spinal stenosis of lumbar region   . Bifascicular block     Past Surgical History  Procedure Date  . Cabg 2003    by Dr Laneta Simmers  . Anal fissure repair   . Leg surgery     right  . Trigger finger release     bilat.  . Tonsillectomy   . Back surgery 2010  . Atrial ablation surgery 1/12    atrial flutter ablation by Dr Ladona Ridgel     History  Smoking status  . Former Smoker -- 1.0 packs/day for 49 years  . Quit date: 08/21/2001  Smokeless tobacco  . Not on file  Comment: started at age 37.     History  Alcohol Use No    Family History  Problem Relation Age of Onset  . Heart disease Father   . Heart disease Paternal Grandfather   . Stroke Mother   . Rheum arthritis Mother     Review of Systems: The review of systems is per the HPI.  All other systems were reviewed and are negative.  Physical Exam: BP 132/64  Pulse 120  Ht 5\' 6"  (1.676 m)  Wt 198 lb (89.812 kg)  BMI 31.96 kg/m2  SpO2 96% Patient is very pleasant and in no acute distress. He remains obese.  Skin is warm and dry. Color is normal.  HEENT is unremarkable. Normocephalic/atraumatic. PERRL. Sclera are nonicteric. Neck is supple. No masses. No JVD. Lungs are clear. Cardiac exam shows an irregular rate and rhythm. He is tachycardic. Has an outflow murmur as well. Abdomen is soft. Extremities are without edema. Gait and ROM are intact. No gross neurologic deficits noted.  LABORATORY DATA: Labs are pending for today.   EKG today shows probably 2:1 atrial flutter. Rate of 120. Tracing is reviewed with Dr. Graciela Husbands (DOD).  Echo Study Conclusions from April 2013  - Left ventricle: The cavity size was normal. Wall thickness was increased in a pattern of moderate LVH. Systolic function was normal. The estimated ejection fraction was in the range of 55% to 60%. Wall motion was normal; there were no regional wall motion abnormalities. Doppler parameters are consistent with abnormal left ventricular relaxation (grade 1 diastolic dysfunction). Doppler parameters are consistent with high ventricular filling pressure. - Aortic valve: Valve mobility was restricted. There was moderate to severe stenosis. Trivial regurgitation. Mean gradient: 37mm Hg (S). Peak gradient: 68mm Hg (S). - Mitral valve: Calcified annulus. - Left atrium: The atrium was  mildly dilated. - Pulmonary arteries: Systolic pressure was mildly increased. PA peak pressure: 34mm Hg (S).    Lab Results  Component Value Date   WBC 8.1 09/30/2010   HGB 14.4 09/30/2010   HCT 42.4 09/30/2010   PLT 379.0 09/30/2010   GLUCOSE 170* 09/30/2010  ALT 30 09/30/2010   AST 24 09/30/2010   NA 140 09/30/2010   K 4.0 09/30/2010   CL 102 09/30/2010   CREATININE 0.9 09/30/2010   BUN 19 09/30/2010   CO2 27 09/30/2010   TSH 1.33 09/30/2010   INR 2.7 04/06/2012   Assessment / Plan:  1. PAFlutter - prior ablation. Heart rate was fast today. EKG is ordered. Reviewed with Dr. Graciela Husbands. It appears to be atrial flutter. Difficult to say even according to Dr. Graciela Husbands. He has known carotid disease so will not do carotid massage. Per Dr. Graciela Husbands we will arrange for cardioversion this week and give adenosine prior to see if this is what we are really dealing with. His coumadin has been therapeutic. We will check labs and CXR today. Metoprolol is increased to 100 mg BID. Will go ahead and get him a follow up with Dr. Johney Frame to discuss possible repeat ablation.   2. CAD - past CABG. No recurrent chest pain.   3. HTN - blood pressure is ok.   4. Moderate to severe AS - still with normal LV function - will recheck his echo once we get his heart rate down. He may be getting close to replacement. No cardinal symptoms at this time.   5. Obesity - chronic  Patient is agreeable to this plan and will call if any problems develop in the interim.

## 2012-04-07 ENCOUNTER — Encounter (HOSPITAL_COMMUNITY)
Admission: RE | Disposition: A | Discharge status: Home / Self Care | Payer: Self-pay | Source: Ambulatory Visit | Attending: Cardiovascular Disease

## 2012-04-07 ENCOUNTER — Encounter (HOSPITAL_COMMUNITY): Payer: Self-pay | Admitting: Anesthesiology

## 2012-04-07 ENCOUNTER — Ambulatory Visit (HOSPITAL_COMMUNITY)
Admission: RE | Admit: 2012-04-07 | Discharge: 2012-04-07 | Disposition: A | Discharge status: Home / Self Care | Payer: Medicare Other | Source: Ambulatory Visit | Attending: Cardiovascular Disease | Admitting: Cardiovascular Disease

## 2012-04-07 ENCOUNTER — Encounter (HOSPITAL_COMMUNITY): Payer: Self-pay

## 2012-04-07 ENCOUNTER — Ambulatory Visit (HOSPITAL_COMMUNITY): Payer: Medicare Other | Admitting: Anesthesiology

## 2012-04-07 DIAGNOSIS — I4891 Unspecified atrial fibrillation: Secondary | ICD-10-CM

## 2012-04-07 DIAGNOSIS — I2510 Atherosclerotic heart disease of native coronary artery without angina pectoris: Secondary | ICD-10-CM | POA: Insufficient documentation

## 2012-04-07 DIAGNOSIS — I10 Essential (primary) hypertension: Secondary | ICD-10-CM | POA: Insufficient documentation

## 2012-04-07 DIAGNOSIS — E785 Hyperlipidemia, unspecified: Secondary | ICD-10-CM | POA: Insufficient documentation

## 2012-04-07 DIAGNOSIS — Z7901 Long term (current) use of anticoagulants: Secondary | ICD-10-CM | POA: Insufficient documentation

## 2012-04-07 DIAGNOSIS — I4892 Unspecified atrial flutter: Secondary | ICD-10-CM

## 2012-04-07 DIAGNOSIS — E119 Type 2 diabetes mellitus without complications: Secondary | ICD-10-CM | POA: Insufficient documentation

## 2012-04-07 HISTORY — PX: CARDIOVERSION: SHX1299

## 2012-04-07 SURGERY — CARDIOVERSION
Anesthesia: Monitor Anesthesia Care

## 2012-04-07 MED ORDER — PROPOFOL 10 MG/ML IV BOLUS
INTRAVENOUS | Status: DC | PRN
  Administered 2012-04-07 (×2): 100 mg via INTRAVENOUS

## 2012-04-07 MED ORDER — DEXTROSE-NACL 5-0.45 % IV SOLN
INTRAVENOUS | Status: DC
  Administered 2012-04-07: 500 mL via INTRAVENOUS

## 2012-04-07 MED ORDER — HYDROCORTISONE 1 % EX CREA: 1.0000 "application " | TOPICAL_CREAM | Freq: Three times a day (TID) | CUTANEOUS | Status: DC | PRN

## 2012-04-07 MED ORDER — ADENOSINE 6 MG/2ML IV SOLN
6.0000 mg | Freq: Once | INTRAVENOUS | Status: DC
  Filled 2012-04-07: qty 2

## 2012-04-07 NOTE — Preoperative (Signed)
Beta Blockers   Reason not to administer Beta Blockers:Not Applicable 

## 2012-04-07 NOTE — Interval H&P Note (Signed)
History and Physical Interval Note:  04/07/2012 11:27 AM  Adam [name]  has presented today for surgery, with the diagnosis of a-flutter  The various methods of treatment have been discussed with the patient and family. After consideration of risks, benefits and other options for treatment, the patient has consented to  Procedure(s) (LRB) with comments: CARDIOVERSION (N/A) as a surgical intervention .  The patient's history has been reviewed, patient examined, no change in status, stable for surgery.  I have reviewed the patient's chart and labs.  Questions were answered to the patient's satisfaction.     [name]  Patient with likely rapid flutter Dr Graciela Husbands wanted adenosine given before [facility] to see exactly what rhythm is.    [name]

## 2012-04-07 NOTE — H&P (View-Only) (Signed)
 Adam [name] Date of Birth: 06/24/1934 Medical Record #[id number]  History of Present Illness: Adam [name] is seen back today for his 6 month check. He is seen for Dr. Allred. He is a former patient of Dr. Tennant's. He has known CAD with prior CABG back in 2003, last stress study in 2008. Has moderate to severe AS and had an echo earlier this year. Has had atrial flutter which required ablation back in 2012. He remains on his coumadin. His other issues include spinal stenosis, DM, HLD and HTN. He is a retired pharmacist.  He was last seen 6 months ago by Dr. Allred. Echo was ordered. Metoprolol was cut back at that visit. Dr. Allred's plan was that if he had not had recurrent atrial flutter, then we would place an event monitor and consider stopping coumadin.   He comes in today. He is here alone. He says he is not quite sure how he feels. He seems more upset about his echo results from April. He is not having chest pain. He has some mild dyspnea but nothing out of character for him. No syncope. He is not really active and really hasn't been as a general rule. His dizziness has improved with med changes from his last visit. He does not check his blood pressure at home. Does not have any palpitations. No awareness of his heart rate or rhythm.   Current Outpatient Prescriptions on File Prior to Visit  Medication Sig Dispense Refill  . budesonide-formoterol (SYMBICORT) 160-4.5 MCG/ACT inhaler Inhale 2 puffs into the lungs 2 (two) times daily.      . doxazosin (CARDURA) 4 MG tablet Take 4 mg by mouth at bedtime.        . hydrochlorothiazide (HYDRODIURIL) 25 MG tablet Take 0.5 tablets (12.5 mg total) by mouth daily.  45 tablet  3  . HYDROcodone-acetaminophen (NORCO) 5-325 MG per tablet Take 1 tablet by mouth as needed.       . insulin aspart (NOVOLOG) 100 UNIT/ML injection Take as directed       . insulin detemir (LEVEMIR) 100 UNIT/ML injection Take as directed       . LORazepam (ATIVAN) 1 MG  tablet Take 1-2 mg by mouth at bedtime as needed.        . losartan (COZAAR) 100 MG tablet Take 1 tablet (100 mg total) by mouth daily.  90 tablet  3  . metFORMIN (GLUCOPHAGE-XR) 500 MG 24 hr tablet Take 1,000 mg by mouth at bedtime.        . potassium chloride (K-DUR,KLOR-CON) 10 MEQ tablet Take 1 tablet (10 mEq total) by mouth daily.  90 tablet  3  . simvastatin (ZOCOR) 10 MG tablet Take 1 tablet (10 mg total) by mouth at bedtime.  90 tablet  3  . Tamsulosin HCl (FLOMAX) 0.4 MG CAPS Take 0.4 mg by mouth at bedtime.       . valsartan (DIOVAN) 320 MG tablet Take 1 tablet (320 mg total) by mouth daily.  90 tablet  3  . venlafaxine (EFFEXOR) 75 MG tablet Take 150mg in the morning and 75mg at bedtime.      . warfarin (COUMADIN) 5 MG tablet Take 1 tablet (5 mg total) by mouth as directed.  180 tablet  1  . zolpidem (AMBIEN) 5 MG tablet Take 5-10 mg by mouth at bedtime as needed.        . DISCONTD: metoprolol (LOPRESSOR) 100 MG tablet Take 0.5 tablets (50 mg total) by mouth 2 (  two) times daily.  90 tablet  3    Allergies  Allergen Reactions  . Doxycycline     REACTION: swollen tongue    Past Medical History  Diagnosis Date  . Hypertension   . Hyperlipidemia   . Type II or unspecified type diabetes mellitus without mention of complication, not stated as uncontrolled   . Nephrolithiasis   . Atrial flutter     s/p CTI ablation by Dr Taylor 1/12  . CAD (coronary artery disease)     s/p CABG 2003 by Dr Bartle  . Aortic stenosis, moderate     moderate to severe per echo 09/2011  . DJD (degenerative joint disease)   . DDD (degenerative disc disease)   . Spinal stenosis of lumbar region   . Bifascicular block     Past Surgical History  Procedure Date  . Cabg 2003    by Dr Bartle  . Anal fissure repair   . Leg surgery     right  . Trigger finger release     bilat.  . Tonsillectomy   . Back surgery 2010  . Atrial ablation surgery 1/12    atrial flutter ablation by Dr Taylor     History  Smoking status  . Former Smoker -- 1.0 packs/day for 49 years  . Quit date: 08/21/2001  Smokeless tobacco  . Not on file  Comment: started at age 18.     History  Alcohol Use No    Family History  Problem Relation Age of Onset  . Heart disease Father   . Heart disease Paternal Grandfather   . Stroke Mother   . Rheum arthritis Mother     Review of Systems: The review of systems is per the HPI.  All other systems were reviewed and are negative.  Physical Exam: BP 132/64  Pulse 120  Ht 5' 6" (1.676 m)  Wt 198 lb (89.812 kg)  BMI 31.96 kg/m2  SpO2 96% Patient is very pleasant and in no acute distress. He remains obese.  Skin is warm and dry. Color is normal.  HEENT is unremarkable. Normocephalic/atraumatic. PERRL. Sclera are nonicteric. Neck is supple. No masses. No JVD. Lungs are clear. Cardiac exam shows an irregular rate and rhythm. He is tachycardic. Has an outflow murmur as well. Abdomen is soft. Extremities are without edema. Gait and ROM are intact. No gross neurologic deficits noted.  LABORATORY DATA: Labs are pending for today.   EKG today shows probably 2:1 atrial flutter. Rate of 120. Tracing is reviewed with Dr. Klein (DOD).  Echo Study Conclusions from April 2013  - Left ventricle: The cavity size was normal. Wall thickness was increased in a pattern of moderate LVH. Systolic function was normal. The estimated ejection fraction was in the range of 55% to 60%. Wall motion was normal; there were no regional wall motion abnormalities. Doppler parameters are consistent with abnormal left ventricular relaxation (grade 1 diastolic dysfunction). Doppler parameters are consistent with high ventricular filling pressure. - Aortic valve: Valve mobility was restricted. There was moderate to severe stenosis. Trivial regurgitation. Mean gradient: 37mm Hg (S). Peak gradient: 68mm Hg (S). - Mitral valve: Calcified annulus. - Left atrium: The atrium was  mildly dilated. - Pulmonary arteries: Systolic pressure was mildly increased. PA peak pressure: 34mm Hg (S).    Lab Results  Component Value Date   WBC 8.1 09/30/2010   HGB 14.4 09/30/2010   HCT 42.4 09/30/2010   PLT 379.0 09/30/2010   GLUCOSE 170* 09/30/2010     ALT 30 09/30/2010   AST 24 09/30/2010   NA 140 09/30/2010   K 4.0 09/30/2010   CL 102 09/30/2010   CREATININE 0.9 09/30/2010   BUN 19 09/30/2010   CO2 27 09/30/2010   TSH 1.33 09/30/2010   INR 2.7 04/06/2012   Assessment / Plan:  1. PAFlutter - prior ablation. Heart rate was fast today. EKG is ordered. Reviewed with Dr. Klein. It appears to be atrial flutter. Difficult to say even according to Dr. Klein. He has known carotid disease so will not do carotid massage. Per Dr. Klein we will arrange for cardioversion this week and give adenosine prior to see if this is what we are really dealing with. His coumadin has been therapeutic. We will check labs and CXR today. Metoprolol is increased to 100 mg BID. Will go ahead and get him a follow up with Dr. Allred to discuss possible repeat ablation.   2. CAD - past CABG. No recurrent chest pain.   3. HTN - blood pressure is ok.   4. Moderate to severe AS - still with normal LV function - will recheck his echo once we get his heart rate down. He may be getting close to replacement. No cardinal symptoms at this time.   5. Obesity - chronic  Patient is agreeable to this plan and will call if any problems develop in the interim.      

## 2012-04-07 NOTE — CV Procedure (Signed)
DCC:  Patient has had a previous ablation.  Baseline ECG with IVCD and tachycardia not clear if it is flutter or atrial tachycardia.  Given 6 mg and then 12 mg of adenosine during procedure Received 3 shocks 150/200/200 biphasic.  Converts each time to NSR with change in rate and atrial morphology but then speeds rigtht back up to HR 100-120 with what appears to be a low Atrial tachycardia.  Again this seemed to be confirmed with adenosine administration.  Received 200mg  of propofol as hypnotic.  No immediate neurologic sequelae.  Discussed with Dr Johney Frame Who will see patient as it is not ideal to be this tachycardic with severe AS.    [name]

## 2012-04-07 NOTE — Transfer of Care (Signed)
Immediate Anesthesia Transfer of Care Note  Patient: Adam [name] 4 Procedure(s) Performed: Procedure(s) (LRB) with comments: CARDIOVERSION (N/A)  Patient Location: PACU  Anesthesia Type: General  Level of Consciousness: awake, alert , oriented and patient cooperative  Airway & Oxygen Therapy: Patient Spontanous Breathing and Patient connected to nasal cannula oxygen  Post-op Assessment: Report given to PACU RN, Post -op Vital signs reviewed and stable, Patient moving all extremities and Patient moving all extremities X 4  Post vital signs: Reviewed and stable  Complications: No apparent anesthesia complications

## 2012-04-07 NOTE — Anesthesia Preprocedure Evaluation (Addendum)
Anesthesia Evaluation  Patient identified by MRN, date of birth, ID band Patient awake    Reviewed: Allergy & Precautions, H&P , NPO status , Patient's Chart, lab work & pertinent test results, reviewed documented beta blocker date and time   History of Anesthesia Complications Negative for: history of anesthetic complications  Airway Mallampati: III TM Distance: >3 FB     Dental  (+) Edentulous Upper and Edentulous Lower   Pulmonary shortness of breath and with exertion, COPD   Pulmonary exam normal       Cardiovascular hypertension, Pt. on medications + CAD + dysrhythmias Rhythm:Irregular Rate:Normal     Neuro/Psych negative neurological ROS  negative psych ROS   GI/Hepatic negative GI ROS, Neg liver ROS,   Endo/Other  diabetes, Well Controlled  Renal/GU      Musculoskeletal   Abdominal Normal abdominal exam  (+)   Peds  Hematology   Anesthesia Other Findings   Reproductive/Obstetrics                           Anesthesia Physical Anesthesia Plan  ASA: III  Anesthesia Plan: General   Post-op Pain Management:    Induction: Intravenous  Airway Management Planned: Mask  Additional Equipment:   Intra-op Plan:   Post-operative Plan:   Informed Consent: I have reviewed the patients History and Physical, chart, labs and discussed the procedure including the risks, benefits and alternatives for the proposed anesthesia with the patient or authorized representative who has indicated his/her understanding and acceptance.     Plan Discussed with: CRNA and Surgeon  Anesthesia Plan Comments:        Anesthesia Quick Evaluation

## 2012-04-07 NOTE — Anesthesia Postprocedure Evaluation (Signed)
  Anesthesia Post-op Note  Patient: Adam [name]  Procedure(s) Performed: Procedure(s) (LRB) with comments: CARDIOVERSION (N/A)  Patient Location: PACU and Endoscopy Unit  Anesthesia Type: General  Level of Consciousness: awake and alert   Airway and Oxygen Therapy: Patient Spontanous Breathing and Patient connected to nasal cannula oxygen  Post-op Pain: none  Post-op Assessment: Post-op Vital signs reviewed, Patient's Cardiovascular Status Stable, Respiratory Function Stable, Patent Airway, No signs of Nausea or vomiting and Pain level controlled  Post-op Vital Signs: Reviewed and stable  Complications: No apparent anesthesia complications

## 2012-04-08 ENCOUNTER — Encounter (HOSPITAL_COMMUNITY): Payer: Self-pay | Admitting: Cardiovascular Disease

## 2012-04-08 NOTE — Addendum Note (Signed)
Addendum  created 04/08/12 1125 by Aubery Lapping, MD   Modules edited:Anesthesia Events

## 2012-04-11 ENCOUNTER — Encounter: Payer: Self-pay | Admitting: Cardiology

## 2012-04-14 ENCOUNTER — Ambulatory Visit (HOSPITAL_COMMUNITY): Payer: Medicare Other | Attending: Nurse Practitioner | Admitting: Radiology

## 2012-04-14 DIAGNOSIS — E119 Type 2 diabetes mellitus without complications: Secondary | ICD-10-CM | POA: Insufficient documentation

## 2012-04-14 DIAGNOSIS — Z79899 Other long term (current) drug therapy: Secondary | ICD-10-CM

## 2012-04-14 DIAGNOSIS — I359 Nonrheumatic aortic valve disorder, unspecified: Secondary | ICD-10-CM | POA: Insufficient documentation

## 2012-04-14 DIAGNOSIS — Z01818 Encounter for other preprocedural examination: Secondary | ICD-10-CM

## 2012-04-14 DIAGNOSIS — I350 Nonrheumatic aortic (valve) stenosis: Secondary | ICD-10-CM

## 2012-04-14 DIAGNOSIS — I10 Essential (primary) hypertension: Secondary | ICD-10-CM | POA: Insufficient documentation

## 2012-04-14 DIAGNOSIS — I2510 Atherosclerotic heart disease of native coronary artery without angina pectoris: Secondary | ICD-10-CM | POA: Insufficient documentation

## 2012-04-14 NOTE — Progress Notes (Signed)
Echocardiogram performed.  

## 2012-04-25 ENCOUNTER — Encounter: Payer: Self-pay | Admitting: Internal Medicine

## 2012-04-25 ENCOUNTER — Ambulatory Visit (INDEPENDENT_AMBULATORY_CARE_PROVIDER_SITE_OTHER): Payer: Medicare Other | Admitting: Internal Medicine

## 2012-04-25 VITALS — BP 134/66 | HR 70 | Ht 68.0 in | Wt 197.0 lb

## 2012-04-25 DIAGNOSIS — I10 Essential (primary) hypertension: Secondary | ICD-10-CM

## 2012-04-25 DIAGNOSIS — I359 Nonrheumatic aortic valve disorder, unspecified: Secondary | ICD-10-CM

## 2012-04-25 DIAGNOSIS — I498 Other specified cardiac arrhythmias: Secondary | ICD-10-CM

## 2012-04-25 DIAGNOSIS — I471 Supraventricular tachycardia: Secondary | ICD-10-CM

## 2012-04-25 DIAGNOSIS — I350 Nonrheumatic aortic (valve) stenosis: Secondary | ICD-10-CM

## 2012-04-25 NOTE — Patient Instructions (Signed)
Your physician recommends that you schedule a follow-up appointment in: 3 months with Lori Gerhardt, NP and 6 months with Dr Allred  

## 2012-04-25 NOTE — Assessment & Plan Note (Signed)
The patient recently presented with ectopic atrial tachycardia.  He is presently doing well with medical therapy and would like to avoid any further ablation procedures.  Given bifascicular block, we will be cautious with any medicine changes.

## 2012-04-25 NOTE — Assessment & Plan Note (Signed)
The patient has probably severe AS but with minimal symptoms His echo is stable.  We will continue to follow clinically, though I would anticipate that he may require surgical consultation in the future with any significant symptomatic progression.

## 2012-04-25 NOTE — Assessment & Plan Note (Signed)
Stable No change required today  

## 2012-04-25 NOTE — Progress Notes (Signed)
The patient presents today for routine electrophysiology followup. He remains very active.  He recently presented to our office with ectopic atrial tachycardia. He underwent cardioversion by Dr Eden Emms but quickly returned to tachycardia.  I saw him at that time and we elected to continue rate control and follow. He has done very well since.  He has been checking his pulse at home and notes that it is predominantly 70s.  He denies any significant tachycardia since that time.  He also has moderately severe AS for which he is minimally if at all symptomatic.  Today, he denies symptoms of palpitations, chest pain, shortness of breath, orthopnea, PND, lower extremity edema,  presyncope, syncope, or neurologic sequela.  The patient feels that he is tolerating medications without difficulties and is otherwise without complaint today.   Past Medical History  Diagnosis Date  . Hypertension   . Hyperlipidemia   . Type II or unspecified type diabetes mellitus without mention of complication, not stated as uncontrolled   . Nephrolithiasis   . Atrial flutter     s/p CTI ablation by Dr Ladona Ridgel 1/12  . CAD (coronary artery disease)     s/p CABG 2003 by Dr Laneta Simmers  . Aortic stenosis, moderate     moderate to severe per echo 03/2012  . DJD (degenerative joint disease)   . DDD (degenerative disc disease)   . Spinal stenosis of lumbar region   . Bifascicular block   . Atrial tachycardia    Past Surgical History  Procedure Date  . Cabg 2003    by Dr Laneta Simmers  . Anal fissure repair   . Leg surgery     right  . Trigger finger release     bilat.  . Tonsillectomy   . Back surgery 2010  . Atrial ablation surgery 1/12    atrial flutter ablation by Dr Ladona Ridgel  . Cardioversion 04/07/2012    Procedure: CARDIOVERSION;  Surgeon: Wendall Stade, MD;  Location: [facility] ENDOSCOPY;  Service: Cardiovascular;  Laterality: N/A;    Current Outpatient Prescriptions  Medication Sig Dispense Refill  . budesonide-formoterol  (SYMBICORT) 160-4.5 MCG/ACT inhaler Inhale 2 puffs into the lungs 2 (two) times daily.      . carisoprodol (SOMA) 350 MG tablet Take 350 mg by mouth 4 (four) times daily as needed.      . cilostazol (PLETAL) 50 MG tablet Take 1 tablet by mouth Twice daily.      . hydrochlorothiazide (HYDRODIURIL) 25 MG tablet Take 0.5 tablets (12.5 mg total) by mouth daily.  45 tablet  3  . HYDROcodone-acetaminophen (NORCO) 5-325 MG per tablet Take 1 tablet by mouth as needed.       . insulin aspart (NOVOLOG) 100 UNIT/ML injection Take as directed       . insulin detemir (LEVEMIR) 100 UNIT/ML injection Take as directed       . LORazepam (ATIVAN) 1 MG tablet Take 1-2 mg by mouth at bedtime as needed.        [name] losartan (COZAAR) 100 MG tablet Take 1 tablet (100 mg total) by mouth daily.  90 tablet  3  . metFORMIN (GLUCOPHAGE-XR) 500 MG 24 hr tablet Take 1,000 mg by mouth at bedtime.        . metoprolol (LOPRESSOR) 100 MG tablet Take 1 tablet (100 mg total) by mouth 2 (two) times daily.  90 tablet  3  . potassium chloride (K-DUR,KLOR-CON) 10 MEQ tablet Take 1 tablet (10 mEq total) by mouth daily.  90 tablet  3  . simvastatin (ZOCOR) 10 MG tablet Take 1 tablet (10 mg total) by mouth at bedtime.  90 tablet  3  . Tamsulosin HCl (FLOMAX) 0.4 MG CAPS Take 0.4 mg by mouth at bedtime.       . valsartan (DIOVAN) 320 MG tablet Take 1 tablet (320 mg total) by mouth daily.  90 tablet  3  . venlafaxine (EFFEXOR) 75 MG tablet Take 150mg  in the morning and 75mg  at bedtime.      [name] warfarin (COUMADIN) 5 MG tablet Take 1 tablet (5 mg total) by mouth as directed.  180 tablet  1  . zolpidem (AMBIEN) 5 MG tablet Take 5-10 mg by mouth at bedtime as needed.          Allergies  Allergen Reactions  . Doxycycline     REACTION: swollen tongue    History   Social History  . Marital Status: Married    Spouse Name: N/A    Number of Children: Y  . Years of Education: N/A   Occupational History  . pharmacist     retired   Social  History Main Topics  . Smoking status: Former Smoker -- 1.0 packs/day for 49 years    Quit date: 08/21/2001  . Smokeless tobacco: Not on file     Comment: started at age 74.   [name] Alcohol Use: No  . Drug Use: No  . Sexually Active: No   Other Topics Concern  . Not on file   Social History Narrative    He is a retired Teacher, early years/pre.    Family History  Problem Relation Age of Onset  . Heart disease Father   . Heart disease Paternal Grandfather   . Stroke Mother   . Rheum arthritis Mother     Physical Exam: Filed Vitals:   04/25/12 1149  BP: 134/66  Pulse: 70  Height: 5\' 8"  (1.727 m)  Weight: 197 lb (89.359 kg)  SpO2: 95%    GEN- The patient is elderly appearing, alert and oriented x 3 today.   Head- normocephalic, atraumatic Eyes-  Sclera clear, conjunctiva pink Ears- hearing intact Oropharynx- clear Neck- supple, no JVP Lymph- no cervical lymphadenopathy Lungs- Clear to ausculation bilaterally, normal work of breathing Heart- Regular rate and rhythm,  2/6 SEM LUSB (late peaking) GI- soft, NT, ND, + BS Extremities- no clubbing, cyanosis, or edema MS- no significant deformity or atrophy Skin- no rash or lesion Psych- euthymic mood, full affect Neuro- strength and sensation are intact  ekg today reveals sinus rhythm 65 bpm, PR 196, RBBB, LAHB, unchanged from prior ekgs Echo reviewed  Assessment and Plan:

## 2012-05-17 ENCOUNTER — Ambulatory Visit (INDEPENDENT_AMBULATORY_CARE_PROVIDER_SITE_OTHER): Payer: Medicare Other | Admitting: *Deleted

## 2012-05-17 DIAGNOSIS — I4891 Unspecified atrial fibrillation: Secondary | ICD-10-CM

## 2012-05-17 LAB — POCT INR: INR: 3.2

## 2012-06-08 ENCOUNTER — Encounter: Payer: Self-pay | Admitting: Pulmonary Disease

## 2012-06-08 ENCOUNTER — Ambulatory Visit (INDEPENDENT_AMBULATORY_CARE_PROVIDER_SITE_OTHER): Payer: Medicare Other | Admitting: Pulmonary Disease

## 2012-06-08 VITALS — BP 148/80 | HR 76 | Temp 97.8°F | Ht 66.0 in | Wt 199.2 lb

## 2012-06-08 DIAGNOSIS — J449 Chronic obstructive pulmonary disease, unspecified: Secondary | ICD-10-CM

## 2012-06-08 MED ORDER — ALBUTEROL SULFATE HFA 108 (90 BASE) MCG/ACT IN AERS: 2.0000 | INHALATION_SPRAY | Freq: Four times a day (QID) | RESPIRATORY_TRACT | Status: DC | PRN

## 2012-06-08 NOTE — Progress Notes (Signed)
  Subjective:    Patient ID: Adam [name], male    DOB: Feb 07, 1935, 77 y.o.   MRN: [id number]  HPI Patient comes in today for followup of his known COPD.  He has been staying on his symbicort daily, as well as oxygen at sleep.  He feels that his breathing is at his normal baseline, but has not had an acute exacerbation or infection since the last visit.  He has been having a lot of issues with his atrial arrhythmia, and this is being dealt with by cardiology.   Review of Systems  Constitutional: Negative for fever and unexpected weight change.  HENT: Negative for ear pain, nosebleeds, congestion, sore throat, rhinorrhea, sneezing, trouble swallowing, dental problem, postnasal drip and sinus pressure.        Congestion upon waking in mornings  Eyes: Negative for redness and itching.  Respiratory: Positive for cough, shortness of breath and wheezing. Negative for chest tightness.        With heavy exertion  Cardiovascular: Positive for palpitations and leg swelling.  Gastrointestinal: Negative for nausea and vomiting.  Genitourinary: Negative for dysuria.  Musculoskeletal: Negative for joint swelling.  Skin: Negative for rash.  Neurological: Positive for headaches.  Hematological: Does not bruise/bleed easily.  Psychiatric/Behavioral: Positive for dysphoric mood ( takes Effexor). The patient is not nervous/anxious.        Objective:   Physical Exam Overweight male in no acute distress Nose without purulent discharge noted Neck without thyromegaly or lymphadenopathy Chest with mildly decreased breath sounds, a few basilar crackles, no wheezing Cardiac exam with slightly irregular rhythm, 3/6 systolic murmur Lower extremities with no significant edema, no cyanosis Alert and oriented, moves all 4 extremities.       Assessment & Plan:

## 2012-06-08 NOTE — Patient Instructions (Addendum)
Stay on symbicort, keep mouth rinsed well Will give you a prescription for albuterol rescue inhaler, and can use 2 puffs every 6 hrs if needed for rescue. Work on weight loss and conditioning. followup with me in 6mos.

## 2012-06-08 NOTE — Assessment & Plan Note (Signed)
The patient has been stable since the last visit from a COPD standpoint.  He has not had an acute exacerbation or pulmonary infection.  I have asked him to stay on his symbicort, as well as his nocturnal oxygen.  I discussed with him today the possibility of exertional oxygen, but he would like to avoid this.  I have also stressed to him the importance of weight loss and some type of conditioning program.

## 2012-06-08 NOTE — Addendum Note (Signed)
Addended by: Nita Sells on: 06/08/2012 02:19 PM   Modules accepted: Orders

## 2012-06-14 ENCOUNTER — Other Ambulatory Visit: Payer: Self-pay | Admitting: *Deleted

## 2012-06-14 MED ORDER — WARFARIN SODIUM 5 MG PO TABS: 5.0000 mg | ORAL_TABLET | ORAL | Status: DC

## 2012-06-21 ENCOUNTER — Ambulatory Visit (INDEPENDENT_AMBULATORY_CARE_PROVIDER_SITE_OTHER): Payer: Medicare Other | Admitting: Pharmacist

## 2012-06-21 DIAGNOSIS — I4891 Unspecified atrial fibrillation: Secondary | ICD-10-CM

## 2012-07-05 ENCOUNTER — Ambulatory Visit (INDEPENDENT_AMBULATORY_CARE_PROVIDER_SITE_OTHER): Payer: Medicare Other | Admitting: *Deleted

## 2012-07-05 DIAGNOSIS — I4891 Unspecified atrial fibrillation: Secondary | ICD-10-CM

## 2012-07-18 ENCOUNTER — Encounter: Payer: Self-pay | Admitting: Internal Medicine

## 2012-07-22 ENCOUNTER — Other Ambulatory Visit: Payer: Self-pay | Admitting: Emergency Medicine

## 2012-07-22 DIAGNOSIS — I350 Nonrheumatic aortic (valve) stenosis: Secondary | ICD-10-CM

## 2012-07-22 MED ORDER — POTASSIUM CHLORIDE CRYS ER 10 MEQ PO TBCR: 10.0000 meq | EXTENDED_RELEASE_TABLET | Freq: Every day | ORAL | Status: DC

## 2012-07-25 ENCOUNTER — Ambulatory Visit (INDEPENDENT_AMBULATORY_CARE_PROVIDER_SITE_OTHER): Payer: Medicare Other | Admitting: Nurse Practitioner

## 2012-07-25 ENCOUNTER — Encounter: Payer: Self-pay | Admitting: Nurse Practitioner

## 2012-07-25 ENCOUNTER — Ambulatory Visit (INDEPENDENT_AMBULATORY_CARE_PROVIDER_SITE_OTHER): Payer: Medicare Other | Admitting: *Deleted

## 2012-07-25 VITALS — BP 128/74 | HR 64 | Ht 65.0 in | Wt 185.4 lb

## 2012-07-25 DIAGNOSIS — I350 Nonrheumatic aortic (valve) stenosis: Secondary | ICD-10-CM

## 2012-07-25 DIAGNOSIS — I4891 Unspecified atrial fibrillation: Secondary | ICD-10-CM

## 2012-07-25 DIAGNOSIS — I359 Nonrheumatic aortic valve disorder, unspecified: Secondary | ICD-10-CM

## 2012-07-25 LAB — POCT INR: INR: 6.1

## 2012-07-25 NOTE — Patient Instructions (Signed)
We will get your ultrasound of your heart updated  See Dr. Johney Frame in April as planned  Try to stay active  Call the [facility] office at [phone] if you have any questions, problems or concerns.

## 2012-07-25 NOTE — Progress Notes (Signed)
Adam [name] Date of Birth: 01-Jan-1935 Medical Record #[id number]  History of Present Illness: Adam [name] is seen back today for his 3 month check. He is seen for Dr. Johney Frame. He has multiple issues which include known CAD with prior CABG back in 2003, last stress study in 2008. Has moderate to severe AS and had his last echo back in October. Has had atrial flutter which required ablation back in 2012. He remains on his coumadin. His other issues include spinal stenosis, DM, HLD and HTN. He is a retired Teacher, early years/pre.   He had recurrent atrial tach back in the fall. Had cardioversion but was quick to return to tachycardia. It has been elected to continue with rate control and anticoagulation.   He comes in today. He is here alone. He is doing ok from our standpoint. Not exercising. Not short of breath. Not having chest pain. Wants to exercise but admits he is pretty "lazy". No syncope reported. Heart rates at home have been good. Last echo was back in October. Primary issue today is that he was bit by his cat over the weekend. The wound continues to bleed. INR is over 6 here today. He has not had a tetanus. The wound was dressed by the triage nurse here in the office today.   Current Outpatient Prescriptions on File Prior to Visit  Medication Sig Dispense Refill  . albuterol (PROVENTIL HFA;VENTOLIN HFA) 108 (90 BASE) MCG/ACT inhaler Inhale 2 puffs into the lungs every 6 (six) hours as needed for wheezing.  3 Inhaler  4  . budesonide-formoterol (SYMBICORT) 160-4.5 MCG/ACT inhaler Inhale 2 puffs into the lungs 2 (two) times daily.      . carisoprodol (SOMA) 350 MG tablet Take 350 mg by mouth 4 (four) times daily as needed.      . cilostazol (PLETAL) 50 MG tablet Take 1 tablet by mouth Twice daily.      . hydrochlorothiazide (HYDRODIURIL) 25 MG tablet Take 0.5 tablets (12.5 mg total) by mouth daily.  45 tablet  3  . HYDROcodone-acetaminophen (NORCO) 5-325 MG per tablet Take 1 tablet by mouth as needed.        [name] LORazepam (ATIVAN) 1 MG tablet Take 1-2 mg by mouth at bedtime as needed.        [name] losartan (COZAAR) 100 MG tablet Take 1 tablet (100 mg total) by mouth daily.  90 tablet  3  . metFORMIN (GLUCOPHAGE-XR) 500 MG 24 hr tablet Take 1,000 mg by mouth at bedtime.        . metoprolol (LOPRESSOR) 100 MG tablet Take 1 tablet (100 mg total) by mouth 2 (two) times daily.  90 tablet  3  . potassium chloride (K-DUR,KLOR-CON) 10 MEQ tablet Take 1 tablet (10 mEq total) by mouth daily.  90 tablet  3  . simvastatin (ZOCOR) 10 MG tablet Take 1 tablet (10 mg total) by mouth at bedtime.  90 tablet  3  . Tamsulosin HCl (FLOMAX) 0.4 MG CAPS Take 0.4 mg by mouth at bedtime.       [name] venlafaxine (EFFEXOR) 75 MG tablet Take 150mg  in the morning and 75mg  at bedtime.      [name] warfarin (COUMADIN) 5 MG tablet Take 1 tablet (5 mg total) by mouth as directed.  180 tablet  1  . zolpidem (AMBIEN) 5 MG tablet Take 5-10 mg by mouth at bedtime as needed.       . insulin aspart (NOVOLOG) 100 UNIT/ML injection Take as directed. 26 units qid      .  insulin detemir (LEVEMIR) 100 UNIT/ML injection Take as directed. 34 units bid        Allergies  Allergen Reactions  . Doxycycline     REACTION: swollen tongue    Past Medical History  Diagnosis Date  . Hypertension   . Hyperlipidemia   . Type II or unspecified type diabetes mellitus without mention of complication, not stated as uncontrolled   . Nephrolithiasis   . Atrial flutter     s/p CTI ablation by Dr Ladona Ridgel 1/12  . CAD (coronary artery disease)     s/p CABG 2003 by Dr Laneta Simmers  . Aortic stenosis, moderate     moderate to severe per echo 03/2012  . DJD (degenerative joint disease)   . DDD (degenerative disc disease)   . Spinal stenosis of lumbar region   . Bifascicular block   . Atrial tachycardia     Past Surgical History  Procedure Date  . Cabg 2003    by Dr Laneta Simmers  . Anal fissure repair   . Leg surgery     right  . Trigger finger release     bilat.    . Tonsillectomy   . Back surgery 2010  . Atrial ablation surgery 1/12    atrial flutter ablation by Dr Ladona Ridgel  . Cardioversion 04/07/2012    Procedure: CARDIOVERSION;  Surgeon: Wendall Stade, MD;  Location: [facility] ENDOSCOPY;  Service: Cardiovascular;  Laterality: N/A;    History  Smoking status  . Former Smoker -- 1.0 packs/day for 49 years  . Quit date: 08/21/2001  Smokeless tobacco  . Not on file    Comment: started at age 68.     History  Alcohol Use No    Family History  Problem Relation Age of Onset  . Heart disease Father   . Heart disease Paternal Grandfather   . Stroke Mother   . Rheum arthritis Mother     Review of Systems: The review of systems is per the HPI.  All other systems were reviewed and are negative.  Physical Exam: BP 128/74  Pulse 64  Ht 5\' 5"  (1.651 m)  Wt 185 lb 6.4 oz (84.097 kg)  BMI 30.85 kg/m2 Patient is very pleasant and in no acute distress. Skin is warm and dry. Color is normal.  HEENT is unremarkable. Normocephalic/atraumatic. PERRL. Sclera are nonicteric. Neck is supple. No masses. No JVD. Lungs are clear. Cardiac exam shows a regular rate and rhythm. Harsh outflow murmur noted. Abdomen is obese but soft. Extremities are without edema. His left hand is bandaged. Gait and ROM are intact. No gross neurologic deficits noted.  LABORATORY DATA:  Lab Results  Component Value Date   WBC 7.8 04/06/2012   HGB 14.6 04/06/2012   HCT 44.4 04/06/2012   PLT 313.0 04/06/2012   GLUCOSE 203* 04/06/2012   ALT 30 09/30/2010   AST 24 09/30/2010   NA 139 04/06/2012   K 4.0 04/06/2012   CL 102 04/06/2012   CREATININE 0.8 04/06/2012   BUN 16 04/06/2012   CO2 28 04/06/2012   TSH 1.33 09/30/2010   INR 6.1 07/25/2012   Assessment / Plan: 1. CAD - remote CABG - no chest pain   2. AS - will update his echo  3. Atrial tach - managed with rate control & anticoagulation  4. Cat bite - wound was dressed here in the office. He is going to his PCP for a  tetanus shot.  He is to see Dr. Johney Frame back in April (  recall). He has his labs thru his PCP.  Patient is agreeable to this plan and will call if any problems develop in the interim.

## 2012-07-29 ENCOUNTER — Ambulatory Visit (INDEPENDENT_AMBULATORY_CARE_PROVIDER_SITE_OTHER): Payer: Medicare Other

## 2012-07-29 DIAGNOSIS — I4891 Unspecified atrial fibrillation: Secondary | ICD-10-CM

## 2012-08-02 ENCOUNTER — Ambulatory Visit (HOSPITAL_COMMUNITY): Payer: Medicare Other | Attending: Cardiology | Admitting: Radiology

## 2012-08-02 DIAGNOSIS — I080 Rheumatic disorders of both mitral and aortic valves: Secondary | ICD-10-CM | POA: Insufficient documentation

## 2012-08-02 DIAGNOSIS — I10 Essential (primary) hypertension: Secondary | ICD-10-CM | POA: Insufficient documentation

## 2012-08-02 DIAGNOSIS — E785 Hyperlipidemia, unspecified: Secondary | ICD-10-CM | POA: Insufficient documentation

## 2012-08-02 DIAGNOSIS — I350 Nonrheumatic aortic (valve) stenosis: Secondary | ICD-10-CM

## 2012-08-02 DIAGNOSIS — I2510 Atherosclerotic heart disease of native coronary artery without angina pectoris: Secondary | ICD-10-CM | POA: Insufficient documentation

## 2012-08-02 DIAGNOSIS — I4892 Unspecified atrial flutter: Secondary | ICD-10-CM | POA: Insufficient documentation

## 2012-08-02 DIAGNOSIS — I359 Nonrheumatic aortic valve disorder, unspecified: Secondary | ICD-10-CM

## 2012-08-02 DIAGNOSIS — E669 Obesity, unspecified: Secondary | ICD-10-CM | POA: Insufficient documentation

## 2012-08-02 NOTE — Progress Notes (Signed)
Echocardiogram performed.  

## 2012-08-08 ENCOUNTER — Ambulatory Visit (INDEPENDENT_AMBULATORY_CARE_PROVIDER_SITE_OTHER): Payer: Medicare Other | Admitting: Pharmacist

## 2012-08-08 DIAGNOSIS — I4891 Unspecified atrial fibrillation: Secondary | ICD-10-CM

## 2012-08-08 LAB — POCT INR: INR: 3.6

## 2012-08-09 ENCOUNTER — Other Ambulatory Visit: Payer: Self-pay | Admitting: *Deleted

## 2012-08-09 DIAGNOSIS — I359 Nonrheumatic aortic valve disorder, unspecified: Secondary | ICD-10-CM

## 2012-08-18 ENCOUNTER — Ambulatory Visit (HOSPITAL_COMMUNITY)
Admission: RE | Admit: 2012-08-18 | Discharge: 2012-08-18 | Disposition: A | Discharge status: Home / Self Care | Payer: Medicare Other | Source: Ambulatory Visit | Attending: Thoracic Surgery (Cardiothoracic Vascular Surgery) | Admitting: Thoracic Surgery (Cardiothoracic Vascular Surgery)

## 2012-08-18 ENCOUNTER — Inpatient Hospital Stay (HOSPITAL_COMMUNITY)
Admission: RE | Admit: 2012-08-18 | Discharge: 2012-08-18 | Disposition: A | Discharge status: Home / Self Care | Payer: Medicare Other | Source: Ambulatory Visit

## 2012-08-18 DIAGNOSIS — I359 Nonrheumatic aortic valve disorder, unspecified: Secondary | ICD-10-CM | POA: Insufficient documentation

## 2012-08-18 LAB — PULMONARY FUNCTION TEST

## 2012-08-18 MED ORDER — ALBUTEROL SULFATE (5 MG/ML) 0.5% IN NEBU
2.5000 mg | INHALATION_SOLUTION | Freq: Once | RESPIRATORY_TRACT | Status: AC
  Administered 2012-08-18: 2.5 mg via RESPIRATORY_TRACT

## 2012-08-19 ENCOUNTER — Encounter (HOSPITAL_COMMUNITY): Payer: Self-pay | Admitting: Thoracic Surgery (Cardiothoracic Vascular Surgery)

## 2012-08-19 ENCOUNTER — Ambulatory Visit (INDEPENDENT_AMBULATORY_CARE_PROVIDER_SITE_OTHER): Payer: Medicare Other | Admitting: *Deleted

## 2012-08-19 ENCOUNTER — Encounter (HOSPITAL_COMMUNITY): Payer: Self-pay | Admitting: Cardiovascular Disease

## 2012-08-19 ENCOUNTER — Other Ambulatory Visit (HOSPITAL_COMMUNITY): Payer: Self-pay | Admitting: *Deleted

## 2012-08-19 ENCOUNTER — Ambulatory Visit (HOSPITAL_COMMUNITY)
Admission: RE | Admit: 2012-08-19 | Discharge: 2012-08-19 | Disposition: A | Discharge status: Home / Self Care | Payer: Medicare Other | Source: Ambulatory Visit | Attending: Cardiovascular Disease | Admitting: Cardiovascular Disease

## 2012-08-19 ENCOUNTER — Ambulatory Visit (HOSPITAL_BASED_OUTPATIENT_CLINIC_OR_DEPARTMENT_OTHER)
Admission: RE | Admit: 2012-08-19 | Discharge: 2012-08-19 | Disposition: A | Discharge status: Home / Self Care | Payer: Medicare Other | Source: Ambulatory Visit | Attending: Thoracic Surgery (Cardiothoracic Vascular Surgery) | Admitting: Thoracic Surgery (Cardiothoracic Vascular Surgery)

## 2012-08-19 ENCOUNTER — Encounter (HOSPITAL_COMMUNITY): Payer: Self-pay | Admitting: *Deleted

## 2012-08-19 ENCOUNTER — Other Ambulatory Visit: Payer: Self-pay

## 2012-08-19 VITALS — BP 111/58 | HR 87 | Resp 20 | Ht 64.0 in | Wt 195.0 lb

## 2012-08-19 DIAGNOSIS — I359 Nonrheumatic aortic valve disorder, unspecified: Secondary | ICD-10-CM | POA: Insufficient documentation

## 2012-08-19 DIAGNOSIS — I350 Nonrheumatic aortic (valve) stenosis: Secondary | ICD-10-CM

## 2012-08-19 DIAGNOSIS — I4891 Unspecified atrial fibrillation: Secondary | ICD-10-CM

## 2012-08-19 DIAGNOSIS — Z951 Presence of aortocoronary bypass graft: Secondary | ICD-10-CM | POA: Insufficient documentation

## 2012-08-19 LAB — PROTIME-INR
INR: 2.29 — ABNORMAL HIGH (ref 0.00–1.49)
Prothrombin Time: 24.2 seconds — ABNORMAL HIGH (ref 11.6–15.2)

## 2012-08-19 LAB — CBC WITH DIFFERENTIAL/PLATELET
Basophils Relative: 1 % (ref 0–1)
Eosinophils Absolute: 0.3 10*3/uL (ref 0.0–0.7)
MCH: 29.9 pg (ref 26.0–34.0)
MCHC: 35.1 g/dL (ref 30.0–36.0)
Neutrophils Relative %: 64 % (ref 43–77)
Platelets: 328 10*3/uL (ref 150–400)

## 2012-08-19 LAB — BASIC METABOLIC PANEL
BUN: 21 mg/dL (ref 6–23)
Calcium: 9.5 mg/dL (ref 8.4–10.5)
GFR calc non Af Amer: 79 mL/min — ABNORMAL LOW (ref >90)
Glucose, Bld: 131 mg/dL — ABNORMAL HIGH (ref 70–99)

## 2012-08-19 NOTE — Progress Notes (Signed)
MULTIDISCIPLINARY HEART VALVE CLINIC NOTE  Patient ID: Adam [name] MRN: [id number] DOB/AGE: [birth year] 78 y.o.  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 The patient has coronary artery disease and underwent multivessel CABG in 2003 by Dr. Laneta Simmers.  He has complained of progressive shortness of breath with low-level activity. He has not had resting chest pain or pressure. He is fairly sedentary and is somewhat limited by leg weakness and pain in the back and legs. However, he notes that his shortness of breath is also a significant limiting factor. He tires easily and has to rest often. He denies exertional chest pain, lightheadedness, or syncope.  The patient has undergone catheter ablation for atrial dysrhythmias in the past. Most recently he has been noted to have ectopic atrial tachycardia. He has been treated medically. The patient is anticoagulated with warfarin and he has not had any bleeding problems.  An echocardiogram was done recently because of his known aortic stenosis and progressive symptoms. This demonstrated findings consistent with severe aortic stenosis as outlined below.  Past Medical History  Diagnosis Date  . Hypertension   . Hyperlipidemia   . Type II or unspecified type diabetes mellitus without mention of complication, not stated as uncontrolled   . Nephrolithiasis   . Atrial flutter     s/p CTI ablation by Dr Ladona Ridgel 1/12  . CAD (coronary artery disease)     s/p CABG 2003 by Dr Laneta Simmers  . Aortic stenosis, moderate     moderate to severe per echo 03/2012  . DJD (degenerative joint disease)   . DDD (degenerative disc disease)   . Spinal stenosis of lumbar region   . Bifascicular block   . Atrial tachycardia     Past Surgical History  Procedure Laterality Date  . Cabg  2003    by Dr Laneta Simmers  . Anal fissure repair     . Leg surgery      right  . Trigger finger release      bilat.  . Tonsillectomy    . Back surgery  2010  . Atrial ablation surgery  1/12    atrial flutter ablation by Dr Ladona Ridgel  . Cardioversion  04/07/2012    Procedure: CARDIOVERSION;  Surgeon: Wendall Stade, MD;  Location: [facility] ENDOSCOPY;  Service: Cardiovascular;  Laterality: N/A;    Family History  Problem Relation Age of Onset  . Heart disease Father   . Heart disease Paternal Grandfather   . Stroke Mother   . Rheum arthritis Mother     History   Social History  . Marital Status: Married    Spouse Name: N/A    Number of Children: Y  . Years of Education: N/A   Occupational History  . pharmacist     retired   Social History Main Topics  . Smoking status: Former Smoker -- 1.00 packs/day for 49 years    Quit date: 08/21/2001  . Smokeless tobacco: Not on file     Comment: started at age 13.   [name] Alcohol Use: No  . Drug Use: No  . Sexually Active: No   Other Topics Concern  . Not on file   Social History Narrative    He is a retired Teacher, early years/pre.      (Not in a hospital admission)  Allergies  Allergen Reactions  . Doxycycline     REACTION: swollen tongue    Review  of Systems:  General: NO loss of appetite, NO decreased energy, YES weight gain, NO weight loss, NO fever  Cardiac: NO chest pain with exertion, NO chest pain at rest, YES SOB with exertion, NO resting SOB, NO PND, NO orthopnea, NO palpitations, NO arrhythmia, NO atrial fibrillation, NO LE edema, NO dizzy spells, NO syncope  Respiratory: YES shortness of breath, YES home oxygen, NO productive cough, NO dry cough, NO bronchitis, NO wheezing, NO hemoptysis, NO asthma, NO pain with inspiration or cough, NO sleep apnea, NO CPAP at night  GI: NO difficulty swallowing, NO reflux, NO frequent heartburn, NO hiatal hernia, NO abdominal pain, NO constipation, NO diarrhea, NO hematochezia, NO hematemesis, NO melena  GU: NO dysuria, YES frequency, NO urinary  tract infection, NO hematuria, NO enlarged prostate, YES kidney stones, NO kidney disease  Vascular: NO pain suggestive of claudication, NO pain in feet, NO leg cramps, NO varicose veins, NO DVT, NO non-healing foot ulcer  Neuro: NO stroke, NO TIA's, NO seizures, YES headaches, NOtemporary blindness one eye, NO slurred speech, NO peripheral neuropathy, NO chronic pain, NO instability of gait, NO memory/cognitive dysfunction  Musculoskeletal: NO arthritis, NO joint swelling, YES myalgias, NO difficulty walking, NO mobility  Skin: NO rash, NO itching, NO skin infections, NO pressure sores or ulcerations  Psych: NO anxiety, YES depression, NO nervousness, NO unusual recent stress  Eyes: NO blurry vision, NO floaters, NO recent vision changes, YES wears glasses or contacts  ENT: NO hearing loss, NO loose or painful teeth, YES dentures, last saw dentist 2013  Hematologic: YES easy bruising, NO abnormal bleeding, NOclotting disorder, NO frequent epistaxis  Endocrine: YES diabetes, does check CBG's at home YES   BP 111/58  Pulse 87  Resp 20  Ht 5\' 4"  (1.626 m)  Wt 195 lb (88.451 kg)  BMI 33.46 kg/m2  SpO2 95%  PHYSICAL EXAM: Pt is alert and oriented, WD, WN, overweight elderly male in no distress. HEENT: normal Neck: JVP normal. Carotid upstrokes delayed bilaterally. No thyromegaly. Lungs: equal expansion, clear bilaterally CV: Apex is discrete and nondisplaced, RRR with grade 3/6 harsh crescendo decrescendo systolic murmur at the right upper sternal border Abd: soft, NT, +BS, no bruit, no hepatosplenomegaly Back: no CVA tenderness Ext: no C/C/E        DP/PT pulses intact and = Skin: warm and dry without rash Neuro: CNII-XII intact             Strength intact = bilaterally  EKG:  Normal sinus rhythm with bifascicular block (right bundle branch block and left anterior fascicular block)  2D ECHO:  Left ventricle: The cavity size was normal. Wall thickness was increased in a pattern of  moderate LVH. Systolic function was normal. The estimated ejection fraction was in the range of 60% to 65%. Wall motion was normal; there were no regional wall motion abnormalities. Doppler parameters are consistent with abnormal left ventricular relaxation (grade 1 diastolic dysfunction).  ------------------------------------------------------------ Aortic valve: Probably trileaflet; moderately thickened, moderately calcified leaflets. Doppler: There was severe stenosis. No regurgitation. VTI ratio of LVOT to aortic valve: 0.25. Peak velocity ratio of LVOT to aortic valve: 0.24. Mean gradient: 41mm Hg (S). Peak gradient: 70mm Hg (S).  ------------------------------------------------------------ Aorta: Aortic root: The aortic root was normal in size. Ascending aorta: The ascending aorta was normal in size.  ------------------------------------------------------------ Mitral valve: Calcified annulus. Moderately thickened leaflets . Doppler: The findings are consistent with trivial stenosis. Valve area by pressure half-time: 2.1cm^2. Indexed valve area by pressure half-time: 1.1cm^2/m^2.  Mean gradient: 4mm Hg (D). Peak gradient: 10mm Hg (D).  ------------------------------------------------------------ Left atrium: The atrium was mildly dilated.  ------------------------------------------------------------ Right ventricle: The cavity size was normal. Wall thickness was normal. Systolic function was normal.  ------------------------------------------------------------ Pulmonic valve: Structurally normal valve. Cusp separation was normal. Doppler: Transvalvular velocity was within the normal range. No regurgitation.  ------------------------------------------------------------ Tricuspid valve: Structurally normal valve. Leaflet separation was normal. Doppler: Transvalvular velocity was within the normal range. Mild  regurgitation.  ------------------------------------------------------------ Right atrium: The atrium was normal in size.  ------------------------------------------------------------ Pericardium: There was no pericardial effusion.  ------------------------------------------------------------  2D measurements Normal Doppler measurements Normal Left ventricle Main pulmonary LVID ED, 37.4 mm 43-52 artery chord, Pressure, 25 mm Hg =30 PLAX S LVID ES, 22.5 mm 23-38 Left ventricle chord, Ea, lat 5.04 cm/s ------ PLAX ann, tiss FS, chord, 40 % >29 DP PLAX E/Ea, lat 21.8 ------ LVPW, ED 18.2 mm ------ ann, tiss 3 IVS/LVPW 0.74 <1.3 DP ratio, ED Ea, med 4.75 cm/s ------ Ventricular septum ann, tiss IVS, ED 13.4 mm ------ DP Aorta E/Ea, med 23.1 ------ Root diam, 32 mm ------ ann, tiss 6 ED DP AAo AP 32 mm ------ LVOT diam, S Peak vel, 100 cm/s ------ Left atrium S AP dim 43 mm ------ VTI, S 21.5 cm ------ AP dim 2.25 cm/m^2 <2.2 Aortic valve index Peak vel, 419 cm/s ------ S Mean vel, 294 cm/s ------ S VTI, S 85 cm ------ Mean 41 mm Hg ------ gradient, S Peak 70 mm Hg ------ gradient, S VTI ratio 0.25 ------ LVOT/AV Peak vel 0.24 ------ ratio, LVOT/AV Mitral valve Peak E vel 110 cm/s ------ Peak A vel 131 cm/s ------ Mean vel, 95 cm/s ------ D Decelerati 348 ms 150-23 on time 0 Pressure 105 ms ------ half-time Mean 4 mm Hg ------ gradient, D Peak 10 mm Hg ------ gradient, D Peak E/A 0.8 ------ ratio Area (PHT) 2.1 cm^2 ------ Area index 1.1 cm^2/m ------ (PHT) ^2 Annulus 37.7 cm ------ VTI Tricuspid valve Regurg 222 cm/s ------ peak vel Peak RV-RA 20 mm Hg ------ gradient, S Systemic veins Estimated 5 mm Hg ------ CVP Right ventricle Pressure, 25 mm Hg <30 S Sa vel, 9.65 cm/s ------ lat ann, tiss DP  STS Risk Calculator  Procedure AVR  Risk of Mortality 3.8%  Morbidity or Mortality 22.3%  Prolonged LOS 9.1%  Short LOS 26.5%   Permanent Stroke 1.76%  Prolonged Vent Support 15.4%  DSW Infection 0.3%  Renal Failure 6.0%  Reoperation 9.0%  Procedure AVR + redo CABG  Risk of Mortality 5.5%  Morbidity or Mortality 29.7%  Prolonged LOS 15.9%  Short LOS 17.8%  Permanent Stroke 2.7%  Prolonged Vent Support 20.1%  DSW Infection 0.88%  Renal Failure 9.7%  Reoperation 11.4%   6 Minute Walk Test Results  Patient: Adam [name]  Date: 08/19/2012  Supplemental O2 during test? NO  Baseline End  Time 14:00 14:06  Heartrate 69 94  Dyspnea MILD MODERATE  Fatigue MILD MODERATE  O2 sat 95% 89%  Blood pressure 115/61 110/68  Patient ambulated at a SLOW pace for a total distance of 500 feet with 1 stop.  Ambulation was limited primarily due to shortness of breath, weakness. Only walked 5 minutes, couldn't complete 6 minute test.  Overall the test was tolerated fair.  Pulmonary Function Tests  Baseline Post-bronchodilator  FVC 2.75 L (88% predicted) FVC 2.82 L (91% predicted)  FEV1 2.05 L (93% predicted) FEV1 2.02 L (91% predicted)  FEF25-75 1.57 L (99% predicted) FEF25-75  1.57 L (99% predicted)  RV 2.26 L (103% predicted)  DLCO 44% predicted   ASSESSMENT AND PLAN:  This is a 78 year old gentleman with severe symptomatic aortic stenosis. I have reviewed his echocardiogram images in conjunction with Dr. Cornelius Moras and we evaluated the patient together today in the multidisciplinary valve clinic. His aortic valve clearly has the echocardiographic appearance of a severely calcific, restricted valve. It is difficult to characterize the morphology of the valve because of heavy calcification, but there may be fusion of 2 of the 3 cusps.  We discussed potential treatment options for his aortic stenosis, including consideration of transcatheter aortic valve replacement versus redo open surgical aortic valve replacement. His STS score places him only at moderate risk, and if he were inclined to pursue TAVR he would require  referral to a clinical trial site for a moderate risk trial such as SURTAVI. At this point he favors surgical aortic valve replacement after careful consideration as of the risks and benefit of each treatment option.  The next step in his evaluation will be right and left heart catheterization to evaluate his hemodynamics and graft patency. I have carefully reviewed the potential risks, including vascular injury, bleeding, stroke, myocardial infarction, and death. He understands these risks are low and agrees to proceed. He will be scheduled for next week in the outpatient cardiac cath lab. Further plans pending the results of his cardiac catheterization.  Tonny Bollman 08/20/2012 12:23 AM

## 2012-08-19 NOTE — Progress Notes (Signed)
6 Minute Walk Test Results  Patient: Adam [name] Date:  08/19/2012   Supplemental O2 during test? NO      Baseline   End  Time   14:00    14:06 Heartrate  69    94 Dyspnea  MILD    MODERATE Fatigue  MILD    MODERATE O2 sat   95%    89% Blood pressure 115/61    110/68   Patient ambulated at a SLOW pace for a total distance of 500 feet with 1 stop.  Ambulation was limited primarily due to shortness of breath, weakness.  Only walked 5 minutes, couldn't complete 6 minute test.  Overall the test was tolerated fair.  Pulmonary Function Tests  Baseline      Post-bronchodilator  FVC  2.75 L  (88% predicted) FVC  2.82 L  (91% predicted) FEV1  2.05 L  (93% predicted) FEV1  2.02 L  (91% predicted) FEF25-75 1.57 L  (99% predicted) FEF25-75 1.57 L  (99% predicted)  RV  2.26 L  (103% predicted) DLCO  44% predicted    Review of Systems:   General:  NO loss of appetite, NO decreased energy, YES weight gain, NO weight loss, NO fever  Cardiac:  NO chest pain with exertion, NO chest pain at rest, YES SOB with exertion, NO resting SOB, NO PND, NO orthopnea, NO palpitations, NO arrhythmia, NO atrial fibrillation, NO LE edema, NO dizzy spells, NO syncope  Respiratory:  YES shortness of breath, YES home oxygen, NO productive cough, NO dry cough, NO bronchitis, NO wheezing, NO hemoptysis, NO asthma, NO pain with inspiration or cough, NO sleep apnea, NO CPAP at night  GI:   NO difficulty swallowing, NO reflux, NO frequent heartburn, NO hiatal hernia, NO abdominal pain, NO constipation, NO diarrhea, NO hematochezia, NO hematemesis, NO melena  GU:   NO dysuria,  YES frequency, NO urinary tract infection, NO hematuria, NO enlarged prostate, YES kidney stones, NO kidney disease  Vascular:  NO pain suggestive of claudication, NO pain in feet, NO leg cramps, NO varicose veins, NO DVT, NO non-healing foot ulcer  Neuro:   NO stroke, NO TIA's, NO seizures, YES headaches, NOtemporary blindness one  eye,  NO slurred speech, NO peripheral neuropathy, NO chronic pain, NO instability of gait, NO memory/cognitive dysfunction  Musculoskeletal: NO arthritis, NO joint swelling, YES myalgias, NO difficulty walking, NO mobility   Skin:   NO rash, NO itching, NO skin infections, NO pressure sores or ulcerations  Psych:   NO anxiety, YES depression, NO nervousness, NO unusual recent stress  Eyes:   NO blurry vision, NO floaters, NO recent vision changes, YES wears glasses or contacts  ENT:   NO hearing loss, NO loose or painful teeth, YES dentures, last saw dentist 2013  Hematologic:  YES easy bruising, NO abnormal bleeding, NOclotting disorder, NO frequent epistaxis  Endocrine:  YES diabetes, does check CBG's at home YES

## 2012-08-19 NOTE — Progress Notes (Addendum)
[facility]  MULTIDISCIPLINARY HEART VALVE CLINIC    CARDIOTHORACIC SURGERY CONSULTATION REPORT  Referring Provider is Tyrone Sage, Jennet Maduro, NP PCP is Garlan Fillers, MD  Chief Complaint  Patient presents with  . Aortic Stenosis    EVAL FOR POSSIBLE TAVR, SEVERE AS    HPI:  Patient is a 78 year old moderately obese white male with multiple medical problems including history of aortic stenosis, coronary artery disease status post coronary artery bypass grafting x5 by Dr. Laneta Simmers in 2003, hypertension, type 2 diabetes mellitus, hyperlipidemia, recurrent atrial tachycardia, atrial flutter, and atrial fibrillation, degenerative arthritis of the lumbar spine with chronic back and lower extremity pain and weakness.  The patient underwent CTI ablation by Dr. Ladona Ridgel in January 2012 for recurrent atrial flutter and more recently has been followed by Dr. Johney Frame and Norma Fredrickson in the [facility] office.  The patient reports that he has been told that he had a heart murmur since childhood.  Over the last several years he has  developed aortic stenosis which has progressed in severity during followup by serial echocardiograms.  Most recent echocardiogram suggests the patient now has severe aortic stenosis with mean transvalvular gradient estimated 41 mm mercury and peak velocity across the valve measured greater than 4 m/s. Left ventricular systolic function remains normal with ejection fraction estimated to be 60-65%.  The patient has been referred to the multidisciplinary heart valve clinic for consultation.  The patient describes slow gradual progression of symptoms of exertional shortness of breath.  He lives a fairly sedentary lifestyle and has been physically limited primarily due to weakness and chronic pain in both legs that is attributed to degenerative disease of the lumbar spine.  He admits that he is really not physically active much at all, but overall he states that his breathing  doesn't seem to limit him to much although he has noted that he get short of breath and tired more easily than he had in the past. He feels like his energy level is down. He denies any chest pain chest tightness or chest pressure either with activity or at rest. He denies any resting shortness of breath, PND, orthopnea, or lower extremity edema. He has not had any dizzy spells nor syncope.  Past Medical History  Diagnosis Date  . Hypertension   . Hyperlipidemia   . Type II or unspecified type diabetes mellitus without mention of complication, not stated as uncontrolled   . Nephrolithiasis   . Atrial flutter     s/p CTI ablation by Dr Ladona Ridgel 1/12  . CAD (coronary artery disease)     s/p CABG 2003 by Dr Laneta Simmers  . Aortic stenosis, moderate     moderate to severe per echo 03/2012  . DJD (degenerative joint disease)   . DDD (degenerative disc disease)   . Spinal stenosis of lumbar region   . Bifascicular block   . Atrial tachycardia   . S/P CABG x 5 08/19/2012    CABGx5 by Dr Laneta Simmers: LIMA to LAD, SVG to OM, sequential SVG to PDA and LPLB, SVG to AM, EVH and open vein harvest from right thigh and leg    Past Surgical History  Procedure Laterality Date  . Cabg  2003    by Dr Laneta Simmers  . Anal fissure repair    . Leg surgery      right  . Trigger finger release      bilat.  . Tonsillectomy    . Back surgery  2010  .  Atrial ablation surgery  1/12    atrial flutter ablation by Dr Ladona Ridgel  . Cardioversion  04/07/2012    Procedure: CARDIOVERSION;  Surgeon: Wendall Stade, MD;  Location: [facility] ENDOSCOPY;  Service: Cardiovascular;  Laterality: N/A;    Family History  Problem Relation Age of Onset  . Heart disease Father   . Heart disease Paternal Grandfather   . Stroke Mother   . Rheum arthritis Mother     History   Social History  . Marital Status: Married    Spouse Name: N/A    Number of Children: Y  . Years of Education: N/A   Occupational History  . pharmacist     retired    Social History Main Topics  . Smoking status: Former Smoker -- 1.00 packs/day for 49 years    Quit date: 08/21/2001  . Smokeless tobacco: Not on file     Comment: started at age 43.   [name] Alcohol Use: No  . Drug Use: No  . Sexually Active: No   Other Topics Concern  . Not on file   Social History Narrative    He is a retired Teacher, early years/pre.    Current Outpatient Prescriptions  Medication Sig Dispense Refill  . albuterol (PROVENTIL HFA;VENTOLIN HFA) 108 (90 BASE) MCG/ACT inhaler Inhale 2 puffs into the lungs every 6 (six) hours as needed for wheezing.  3 Inhaler  4  . budesonide-formoterol (SYMBICORT) 160-4.5 MCG/ACT inhaler Inhale 2 puffs into the lungs 2 (two) times daily.      . carisoprodol (SOMA) 350 MG tablet Take 350 mg by mouth 4 (four) times daily as needed.      . cilostazol (PLETAL) 50 MG tablet Take 1 tablet by mouth Twice daily.      [name] HUMALOG 100 UNIT/ML injection       . HUMULIN N 100 UNIT/ML injection       . hydrochlorothiazide (HYDRODIURIL) 25 MG tablet Take 0.5 tablets (12.5 mg total) by mouth daily.  45 tablet  3  . HYDROcodone-acetaminophen (NORCO) 5-325 MG per tablet Take 1 tablet by mouth as needed.       . insulin aspart (NOVOLOG) 100 UNIT/ML injection Take as directed. 26 units qid      . insulin detemir (LEVEMIR) 100 UNIT/ML injection Take as directed. 34 units bid      . LORazepam (ATIVAN) 1 MG tablet Take 1-2 mg by mouth at bedtime as needed.        [name] losartan (COZAAR) 100 MG tablet Take 1 tablet (100 mg total) by mouth daily.  90 tablet  3  . metFORMIN (GLUCOPHAGE-XR) 500 MG 24 hr tablet Take 1,000 mg by mouth at bedtime.        . metoprolol (LOPRESSOR) 100 MG tablet Take 1 tablet (100 mg total) by mouth 2 (two) times daily.  90 tablet  3  . potassium chloride (K-DUR,KLOR-CON) 10 MEQ tablet Take 1 tablet (10 mEq total) by mouth daily.  90 tablet  3  . simvastatin (ZOCOR) 10 MG tablet Take 1 tablet (10 mg total) by mouth at bedtime.  90 tablet  3  .  Tamsulosin HCl (FLOMAX) 0.4 MG CAPS Take 0.4 mg by mouth at bedtime.       [name] venlafaxine (EFFEXOR) 75 MG tablet Take 150mg  in the morning and 75mg  at bedtime.      [name] warfarin (COUMADIN) 5 MG tablet Take 1 tablet (5 mg total) by mouth as directed.  180 tablet  1  . zolpidem (  AMBIEN) 5 MG tablet Take 5-10 mg by mouth at bedtime as needed.        No current facility-administered medications for this encounter.    Allergies  Allergen Reactions  . Doxycycline     REACTION: swollen tongue      Review of Systems:   General:  normal appetite, decreased energy, + weight gain, no weight loss, no fever  Cardiac:  no chest pain with exertion, no chest pain at rest, + SOB with moderate exertion, no resting SOB, no PND, no orthopnea, no palpitations,  arrhythmia, + atrial fibrillation, no LE edema, no dizzy spells, no syncope  Respiratory:  + shortness of breath, + home oxygen at night, no productive cough, no dry cough, no bronchitis, no wheezing, no hemoptysis, no asthma, no pain with inspiration or cough, no sleep apnea, no CPAP at night  GI:   no difficulty swallowing, no reflux, no frequent heartburn, no hiatal hernia, no abdominal pain, no constipation, no diarrhea, no hematochezia, no hematemesis, no melena  GU:   no dysuria,  + frequency, no urinary tract infection, no hematuria, no enlarged prostate, + kidney stones, no kidney disease  Vascular:  no pain suggestive of claudication, + pain in feet, no leg cramps, no varicose veins, no DVT, no non-healing foot ulcer  Neuro:   no stroke, no TIA's, no seizures, occasional headaches, no temporary blindness one eye,  no slurred speech, possible peripheral neuropathy, + chronic pain, no instability of gait, no memory/cognitive dysfunction  Musculoskeletal: + arthritis, no joint swelling, + myalgias, + difficulty walking, mildly limited mobility   Skin:   no rash, no itching, no skin infections, no pressure sores or ulcerations  Psych:   no anxiety, +  depression, no nervousness, no unusual recent stress  Eyes:   no blurry vision, no floaters, no recent vision changes, + wears glasses or contacts  ENT:   no hearing loss, no loose or painful teeth, + dentures, last saw dentist 2013  Hematologic:  + easy bruising, no abnormal bleeding, no clotting disorder, no frequent epistaxis, no problems w/ coumadin therapy  Endocrine:  + diabetes, checks CBG's at home            Physical Exam:   BP 111/58  Pulse 87  Resp 20  Ht 5\' 4"  (1.626 m)  Wt 195 lb (88.451 kg)  BMI 33.46 kg/m2  SpO2 95%  General:  Moderately obese,  well-appearing  HEENT:  Unremarkable   Neck:   no JVD, no bruits, no adenopathy   Chest:   clear to auscultation, symmetrical breath sounds, no wheezes, no rhonchi   CV:   RRR, grade III/VI crescendo/decrescendo murmur heard best at RUSB,  no diastolic murmur  Abdomen:  soft, non-tender, no masses   Extremities:  warm, well-perfused, pulses diminished, no LE edema, old scar R lower leg from SVG harvest  Rectal/GU  Deferred  Neuro:   Grossly non-focal and symmetrical throughout  Skin:   Clean and dry, no rashes, no breakdown   Diagnostic Tests:   Transthoracic Echocardiography  Patient: Hulon, Ferron MR #: [id number] Study Date: 08/02/2012 Gender: M Age: 26 Height: 165.1cm Weight: 83.9kg BSA: 1.62m^2 Pt. Status: Room:  ATTENDING Daiva Huge PERFORMING [facility], [facility] ORDERING Tyrone Sage, Lori REFERRING Norma Fredrickson SONOGRAPHER Junious Dresser, RDCS cc:  ------------------------------------------------------------ LV EF: 60% - 65%  ------------------------------------------------------------ Indications: 424.1 Aortic valve disorders.  ------------------------------------------------------------ History: PMH: Acquired from the patient and from the patient's chart. Harsh  outflow Murmur. Atrial flutter. Coronary artery disease. Borderline significant  aortic stenosis. Risk factors: Former tobacco use. Hypertension. Diabetes mellitus. Obese. Dyslipidemia.  ------------------------------------------------------------ Study Conclusions  - Left ventricle: The cavity size was normal. Wall thickness was increased in a pattern of moderate LVH. Systolic function was normal. The estimated ejection fraction was in the range of 60% to 65%. Wall motion was normal; there were no regional wall motion abnormalities. Doppler parameters are consistent with abnormal left ventricular relaxation (grade 1 diastolic dysfunction). - Aortic valve: There was severe stenosis. Mean gradient: 41mm Hg (S). Peak gradient: 70mm Hg (S). - Mitral valve: Calcified annulus. Moderately thickened leaflets . The findings are consistent with trivial stenosis. Mean gradient: 4mm Hg (D). Peak gradient: 10mm Hg (D). Valve area by pressure half-time: 2.1cm^2. - Left atrium: The atrium was mildly dilated.  ------------------------------------------------------------ Labs, prior tests, procedures, and surgery: Echocardiography (October 2013). The aortic valve showed moderate to severe stenosis. EF was 55%. Aortic valve: peak gradient of 78mm Hg and mean gradient of 36mm Hg.  Electrophysiology study with ablation (2012). Atrial flutter ablation. Coronary artery bypass grafting. Transthoracic echocardiography. M-mode, complete 2D, spectral Doppler, and color Doppler. Height: Height: 165.1cm. Height: 65in. Weight: Weight: 83.9kg. Weight: 184.6lb. Body mass index: BMI: 30.8kg/m^2. Body surface area: BSA: 1.67m^2. Blood pressure: 130/75. Patient status: Outpatient. Location: [facility]  ------------------------------------------------------------  ------------------------------------------------------------ Left ventricle: The cavity size was normal. Wall thickness was increased in a pattern of moderate LVH. Systolic function was normal. The estimated ejection  fraction was in the range of 60% to 65%. Wall motion was normal; there were no regional wall motion abnormalities. Doppler parameters are consistent with abnormal left ventricular relaxation (grade 1 diastolic dysfunction).  ------------------------------------------------------------ Aortic valve: Probably trileaflet; moderately thickened, moderately calcified leaflets. Doppler: There was severe stenosis. No regurgitation. VTI ratio of LVOT to aortic valve: 0.25. Peak velocity ratio of LVOT to aortic valve: 0.24. Mean gradient: 41mm Hg (S). Peak gradient: 70mm Hg (S).  ------------------------------------------------------------ Aorta: Aortic root: The aortic root was normal in size. Ascending aorta: The ascending aorta was normal in size.  ------------------------------------------------------------ Mitral valve: Calcified annulus. Moderately thickened leaflets . Doppler: The findings are consistent with trivial stenosis. Valve area by pressure half-time: 2.1cm^2. Indexed valve area by pressure half-time: 1.1cm^2/m^2. Mean gradient: 4mm Hg (D). Peak gradient: 10mm Hg (D).  ------------------------------------------------------------ Left atrium: The atrium was mildly dilated.  ------------------------------------------------------------ Right ventricle: The cavity size was normal. Wall thickness was normal. Systolic function was normal.  ------------------------------------------------------------ Pulmonic valve: Structurally normal valve. Cusp separation was normal. Doppler: Transvalvular velocity was within the normal range. No regurgitation.  ------------------------------------------------------------ Tricuspid valve: Structurally normal valve. Leaflet separation was normal. Doppler: Transvalvular velocity was within the normal range. Mild regurgitation.  ------------------------------------------------------------ Right atrium: The atrium was normal in  size.  ------------------------------------------------------------ Pericardium: There was no pericardial effusion.  ------------------------------------------------------------  2D measurements Normal Doppler measurements Normal Left ventricle Main pulmonary LVID ED, 37.4 mm 43-52 artery chord, Pressure, 25 mm Hg =30 PLAX S LVID ES, 22.5 mm 23-38 Left ventricle chord, Ea, lat 5.04 cm/s ------ PLAX ann, tiss FS, chord, 40 % >29 DP PLAX E/Ea, lat 21.8 ------ LVPW, ED 18.2 mm ------ ann, tiss 3 IVS/LVPW 0.74 <1.3 DP ratio, ED Ea, med 4.75 cm/s ------ Ventricular septum ann, tiss IVS, ED 13.4 mm ------ DP Aorta E/Ea, med 23.1 ------ Root diam, 32 mm ------ ann, tiss 6 ED DP AAo AP 32 mm ------ LVOT diam, S Peak vel, 100 cm/s ------ Left atrium  S AP dim 43 mm ------ VTI, S 21.5 cm ------ AP dim 2.25 cm/m^2 <2.2 Aortic valve index Peak vel, 419 cm/s ------ S Mean vel, 294 cm/s ------ S VTI, S 85 cm ------ Mean 41 mm Hg ------ gradient, S Peak 70 mm Hg ------ gradient, S VTI ratio 0.25 ------ LVOT/AV Peak vel 0.24 ------ ratio, LVOT/AV Mitral valve Peak E vel 110 cm/s ------ Peak A vel 131 cm/s ------ Mean vel, 95 cm/s ------ D Decelerati 348 ms 150-23 on time 0 Pressure 105 ms ------ half-time Mean 4 mm Hg ------ gradient, D Peak 10 mm Hg ------ gradient, D Peak E/A 0.8 ------ ratio Area (PHT) 2.1 cm^2 ------ Area index 1.1 cm^2/m ------ (PHT) ^2 Annulus 37.7 cm ------ VTI Tricuspid valve Regurg 222 cm/s ------ peak vel Peak RV-RA 20 mm Hg ------ gradient, S Systemic veins Estimated 5 mm Hg ------ CVP Right ventricle Pressure, 25 mm Hg <30 S Sa vel, 9.65 cm/s ------ lat ann, tiss DP  ------------------------------------------------------------ Prepared and Electronically Authenticated by  Cassell Clement [id number]   6 Minute Walk Test Results  Patient: SUFYAN MEIDINGER  Date: 08/19/2012  Supplemental O2 during  test? NO  Baseline End  Time 14:00 14:06  Heartrate 69 94  Dyspnea MILD MODERATE  Fatigue MILD MODERATE  O2 sat 95% 89%  Blood pressure 115/61 110/68  Patient ambulated at a SLOW pace for a total distance of 500 feet with 1 stop.  Ambulation was limited primarily due to shortness of breath, weakness. Only walked 5 minutes, couldn't complete 6 minute test.  Overall the test was tolerated fair.    Pulmonary Function Tests  Baseline Post-bronchodilator  FVC 2.75 L (88% predicted) FVC 2.82 L (91% predicted)  FEV1 2.05 L (93% predicted) FEV1 2.02 L (91% predicted)  FEF25-75 1.57 L (99% predicted) FEF25-75 1.57 L (99% predicted)  RV 2.26 L (103% predicted)  DLCO 44% predicted   STS Risk Calculator  Procedure    AVR  Risk of Mortality   3.8% Morbidity or Mortality  22.3% Prolonged LOS   9.1% Short LOS    26.5% Permanent Stroke   1.76% Prolonged Vent Support  15.4% DSW Infection    0.3% Renal Failure    6.0% Reoperation    9.0%   Procedure    AVR + redo CABG  Risk of Mortality   5.5% Morbidity or Mortality  29.7% Prolonged LOS   15.9% Short LOS    17.8% Permanent Stroke   2.7% Prolonged Vent Support  20.1% DSW Infection    0.88% Renal Failure    9.7% Reoperation    11.4%    Impression:  The patient was evaluated in conjunction with Dr. Excell Seltzer in the multidisciplinary heart valve clinic. We directly reviewed the patient's most recent transthoracic echocardiogram. The patient clearly has aortic stenosis which appears to be severe with heavy calcification and leaflet restriction. Valve morphology is difficult to tell for certain, and its possible that this valve could be bicuspid with fusion of 2 of the 3 cusps. Left ventricular systolic function remains normal. The patient is mildly symptomatic with exertional shortness of breath and fatigue, although he lives a very sedentary lifestyle. Risks associated with conventional aortic valve replacement will be somewhat elevated  because of the patient's age, history of previous coronary artery bypass surgery, obesity, and somewhat limited physical activity. Nevertheless, the patient would probably only at moderate risk for conventional surgery with predicted risk of mortality using the STS score estimated between 3.8 and  5.5% depending upon whether or not concomitant redo coronary artery bypass grafting would be necessary.  With conventional aortic valve replacement the possibility of concomitant Maze procedure could be considered.   Plan:  The patient was counseled at length regarding treatment alternatives for management of severe symptomatic aortic stenosis. Long-term prognosis with medical therapy was discussed. Alternative approaches such as conventional aortic valve replacement, transcatheter aortic valve replacement, and continued medical therapy were compared and contrasted at length. This discussion was placed in the context of the patient's own specific clinical presentation and past medical history.   If the patient was to consider TAVR, at the present time he would need to be referred to a research center where there are ongoing trials evaluating the use of transcatheter aortic valve replacement in patient's at moderate risk for conventional surgery.  The patient is more interested in considering conventional surgery at this point, and this decision has been influenced by the fact that he had a cousin who recently passed away after having undergone TAVR in [place].  He has yet to undergo diagnostic cardiac catheterization, and I favor reassessing the severity of aortic stenosis at the time of catheterization because of the patient's relatively modest degree of symptoms.  The patient will undergo catheterization next week by Dr. Excell Seltzer and be seen in followup by Dr. Laneta Simmers or myself to discuss options further after his catheterization.     Salvatore Decent. Cornelius Moras, MD 08/19/2012 6:41 PM

## 2012-08-20 ENCOUNTER — Encounter (HOSPITAL_COMMUNITY): Payer: Self-pay | Admitting: Cardiovascular Disease

## 2012-08-23 ENCOUNTER — Telehealth: Payer: Self-pay | Admitting: *Deleted

## 2012-08-23 NOTE — Telephone Encounter (Signed)
Per pt - he has no transportation and is unable to come for blood work.  Aurea Graff in [place] lab aware and will "work it out"

## 2012-08-23 NOTE — Telephone Encounter (Signed)
Adam [name] in [place] cath lab requiring pt to have repeat INR today before he can have his cath as scheduled tomorrow.

## 2012-08-24 ENCOUNTER — Encounter (HOSPITAL_BASED_OUTPATIENT_CLINIC_OR_DEPARTMENT_OTHER)
Admission: RE | Disposition: A | Discharge status: Home / Self Care | Payer: Self-pay | Source: Ambulatory Visit | Attending: Cardiovascular Disease

## 2012-08-24 ENCOUNTER — Inpatient Hospital Stay (HOSPITAL_BASED_OUTPATIENT_CLINIC_OR_DEPARTMENT_OTHER)
Admission: RE | Admit: 2012-08-24 | Discharge: 2012-08-24 | Disposition: A | Discharge status: Home / Self Care | Payer: Medicare Other | Source: Ambulatory Visit | Attending: Cardiovascular Disease | Admitting: Cardiovascular Disease

## 2012-08-24 ENCOUNTER — Other Ambulatory Visit: Payer: Self-pay | Admitting: Cardiovascular Disease

## 2012-08-24 DIAGNOSIS — I2582 Chronic total occlusion of coronary artery: Secondary | ICD-10-CM | POA: Insufficient documentation

## 2012-08-24 DIAGNOSIS — I25810 Atherosclerosis of coronary artery bypass graft(s) without angina pectoris: Secondary | ICD-10-CM | POA: Insufficient documentation

## 2012-08-24 DIAGNOSIS — I700 Atherosclerosis of aorta: Secondary | ICD-10-CM

## 2012-08-24 DIAGNOSIS — I2510 Atherosclerotic heart disease of native coronary artery without angina pectoris: Secondary | ICD-10-CM

## 2012-08-24 DIAGNOSIS — I359 Nonrheumatic aortic valve disorder, unspecified: Secondary | ICD-10-CM

## 2012-08-24 DIAGNOSIS — I452 Bifascicular block: Secondary | ICD-10-CM | POA: Insufficient documentation

## 2012-08-24 DIAGNOSIS — E119 Type 2 diabetes mellitus without complications: Secondary | ICD-10-CM | POA: Insufficient documentation

## 2012-08-24 DIAGNOSIS — I10 Essential (primary) hypertension: Secondary | ICD-10-CM | POA: Insufficient documentation

## 2012-08-24 DIAGNOSIS — I708 Atherosclerosis of other arteries: Secondary | ICD-10-CM | POA: Insufficient documentation

## 2012-08-24 DIAGNOSIS — E785 Hyperlipidemia, unspecified: Secondary | ICD-10-CM | POA: Insufficient documentation

## 2012-08-24 DIAGNOSIS — I70209 Unspecified atherosclerosis of native arteries of extremities, unspecified extremity: Secondary | ICD-10-CM | POA: Insufficient documentation

## 2012-08-24 LAB — POCT I-STAT 3, ART BLOOD GAS (G3+)
Bicarbonate: 23.8 mEq/L (ref 20.0–24.0)
pH, Arterial: 7.358 (ref 7.350–7.450)
pO2, Arterial: 94 mmHg (ref 80.0–100.0)

## 2012-08-24 LAB — POCT I-STAT 3, VENOUS BLOOD GAS (G3P V): pH, Ven: 7.337 — ABNORMAL HIGH (ref 7.250–7.300)

## 2012-08-24 SURGERY — JV LEFT AND RIGHT HEART CATHETERIZATION WITH CORONARY ANGIOGRAM
Anesthesia: Moderate Sedation

## 2012-08-24 MED ORDER — ACETAMINOPHEN 325 MG PO TABS
650.0000 mg | ORAL_TABLET | ORAL | Status: DC | PRN
  Administered 2012-08-24: 650 mg via ORAL

## 2012-08-24 MED ORDER — SODIUM CHLORIDE 0.9 % IV SOLN: 1.0000 mL/kg/h | INTRAVENOUS | Status: DC

## 2012-08-24 MED ORDER — SODIUM CHLORIDE 0.9 % IV SOLN: 250.0000 mL | INTRAVENOUS | Status: DC | PRN

## 2012-08-24 MED ORDER — ONDANSETRON HCL 4 MG/2ML IJ SOLN: 4.0000 mg | Freq: Four times a day (QID) | INTRAMUSCULAR | Status: DC | PRN

## 2012-08-24 MED ORDER — SODIUM CHLORIDE 0.9 % IJ SOLN: 3.0000 mL | Freq: Two times a day (BID) | INTRAMUSCULAR | Status: DC

## 2012-08-24 MED ORDER — DIAZEPAM 5 MG PO TABS
5.0000 mg | ORAL_TABLET | ORAL | Status: AC
  Administered 2012-08-24: 5 mg via ORAL

## 2012-08-24 MED ORDER — SODIUM CHLORIDE 0.9 % IJ SOLN: 3.0000 mL | INTRAMUSCULAR | Status: DC | PRN

## 2012-08-24 MED ORDER — SODIUM CHLORIDE 0.9 % IV SOLN: INTRAVENOUS | Status: DC

## 2012-08-24 NOTE — OR Nursing (Signed)
6Fr sheath removed intact from left femoral artery at 0925 by Alvino Blood, site level 0, 6 Fr sheath removed intact from right femoral vein 0927 by Augustine Radar, 5 Fr sheath removed from right femoral artery intact by L Gretzinger at 0930, site level 0

## 2012-08-24 NOTE — OR Nursing (Signed)
Bedrest was to end at 1355, at 1325 pt called for help and was found standing beside his bed with his underwear on stating that he was "ready to leave", pt insisted on leaving. Assisted pt with cleaning hisself and dressing, discharge instructions reviewed and signed, pt and wife stated understanding, pt had already ambulated in room, sites were both level 0, transported to wife's car via wheelchair.

## 2012-08-24 NOTE — OR Nursing (Signed)
Tegaderm dressings applied bilaterally, both site level 0, bedrest began at 0955

## 2012-08-24 NOTE — CV Procedure (Signed)
Cardiac Catheterization Procedure Note  Name: Adam [name] MRN: [id number] DOB: December 18, 1934  Procedure: Right Heart Cath, Left Heart Cath, Selective Coronary Angiography, LV angiography  Indication: Severe aortic stenosis (preoperative cath) in patient with prior CABG.   Procedural Details: The right groin was prepped, draped, and anesthetized with 1% lidocaine. Using the modified Seldinger technique a 5 French sheath was placed in the right femoral artery and a 6 French sheath was placed in the right femoral vein. I was unable to pass a wire up the right iliac artery. An external iliac angiogram was performed and this demonstrated very severe stenosis and diffuse disease throughout the right iliac system. At that point the left groin was prepped. Using the modified Seldinger technique a 5 French sheath was placed in the left common femoral artery. There was very severe calcification and difficulty entering the artery. Using a Glidewire and directional JR 4 catheter I was able to navigate the wire into the aorta, but the catheter would not advance through the calcification and tortuosity of the iliac. I changed out to a Rosen wire and was still unable to advance any catheters or sheaths into the aorta. A 5 French long sheath was attempted and this was unsuccessful. At that point the sheath was removed and a 4 [place] in hole catheter was advanced over the [place] wire into the abdominal aorta. An Amplatz superstiff wire was advanced all the way up to the aortic arch. With a moderate amount of difficulty I was able to advance a 35 cm 6 French sheath into the left common iliac artery. The sheath would not advance beyond the iliac bifurcation. The patient was given 2000 units of unfractionated heparin. A JR 4 catheter was used to image all the vein grafts and the mammary graft. The native right coronary artery was also imaged with this catheter. A JL4 catheter was used for the left coronary artery. An AL-1  catheter was used to direct a straight tip wire across the aortic valve and the Rosen wire was used to change out to a 4 [place] pigtail catheter. Left ventriculography was performed. A pullback gradient was recorded across the aortic valve. An LAO cranial aortic root angiogram was performed with 20 cc of contrast. The pigtail catheter was then brought down into the distal abdominal aorta and an abdominal aortogram was performed also using 20 cc of contrast. The pigtail catheter was removed over a wire. An end hole multipurpose catheter was then used to perform a right heart catheterization under normal technique. Pressures were recorded throughout the right heart chambers and oxygen saturations were drawn from the femoral artery and pulmonary artery. The patient had a great deal of pain during the procedure related to his back. Otherwise he tolerated the procedure well.  Procedural Findings: Hemodynamics RA 8 RV 38/14 PA 41/18 with a mean of 28 PCWP 17 LV 148/26 AO 124/52 with a mean of 80  Oxygen saturations: PA 62 AO 97  Cardiac Output (Fick) 3.7  Cardiac Index (Fick) 1.9  Aortic valve hemodynamics: Mean gradient 22, aortic valve area 0.9 cm   Coronary angiography: Coronary dominance: right  Left mainstem: The left main is moderately calcified. The distal vessel has 40% stenosis.  Left anterior descending (LAD): The proximal LAD is diffusely diseased. The vessel is totally occluded before the first septal perforator.  Left circumflex (LCx): The circumflex is totally occluded. The occlusion is at the ostium of the vessel. There is an intermediate branch is patent with severe  distal disease.  Right coronary artery (RCA): The RCA is totally occluded in its proximal aspect. There are some bridging collaterals to the mid vessel.  Graft angiography: LIMA to LAD: Widely patent throughout.  Saphenous vein graft to OM 1: Severe 80% stenosis in the mid body of the graft. Severe distal  graft stenosis with marked degeneration and irregularity and 90% associated stenosis.  Saphenous vein graft sequential to PDA and PLA branches of the RCA: Widely patent throughout. The graft as mild stenosis probably in the valve in the proximal to mid body. Otherwise the graft is widely patent. The distal branch vessels are diffusely diseased but there are no areas of severe flow limiting stenosis.  Saphenous vein graft to acute marginal branch: Widely patent with no significant stenosis.  Left ventriculography: Left ventricular function is vigorous. The LVEF is estimated at 65-70%. There is no significant mitral regurgitation  Aortic root angiography: The proximal aorta is not dilated. The aortic valve is heavily calcified with restricted movement. There is mild aortic insufficiency present.  Abdominal aortic angiography: The distal abdominal aorta is patent without significant stenosis. The IMA is patent. The right common iliac artery is patent. The the internal iliac artery has 80% ostial stenosis with a normal flow pattern. The external iliac is severely diseased with very tight 99% calcified eccentric stenosis. The left iliac femoral artery is patent. The left external iliac artery has severe diffuse 80% stenosis. The internal iliac is patent. The iliac arteries are severely calcified bilaterally.  Final Conclusions:   1. Severe native three-vessel coronary artery disease with total occlusion of the RCA, LAD, and left circumflex 2. Status post aorta coronary bypass surgery with continued patency of the LIMA to LAD, saphenous vein graft sequential to PDA and PLA branches of the RCA, and saphenous vein graft to the acute marginal. 3. 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 Normal left ventricular systolic function 5. Severe but not critical aortic stenosis 6. Severe iliofemoral disease bilaterally  Tonny Bollman 08/24/2012, 9:31 AM

## 2012-08-24 NOTE — Interval H&P Note (Signed)
History and Physical Interval Note:  08/24/2012 7:50 AM  Adam [name]  has presented today for surgery, with the diagnosis of AS  The various methods of treatment have been discussed with the patient and family. After consideration of risks, benefits and other options for treatment, the patient has consented to  Procedure(s): JV LEFT AND RIGHT HEART CATHETERIZATION WITH CORONARY ANGIOGRAM (N/A) as a surgical intervention .  The patient's history has been reviewed, patient examined, no change in status, stable for surgery.  I have reviewed the patient's chart and labs.  Questions were answered to the patient's satisfaction.     Tonny Bollman

## 2012-08-24 NOTE — H&P (View-Only) (Signed)
MULTIDISCIPLINARY HEART VALVE CLINIC NOTE  Patient ID: Adam [name] MRN: [id number] DOB/AGE: 76/04/1935 77 y.o.  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 The patient has coronary artery disease and underwent multivessel CABG in 2003 by Dr. Bartle.  He has complained of progressive shortness of breath with low-level activity. He has not had resting chest pain or pressure. He is fairly sedentary and is somewhat limited by leg weakness and pain in the back and legs. However, he notes that his shortness of breath is also a significant limiting factor. He tires easily and has to rest often. He denies exertional chest pain, lightheadedness, or syncope.  The patient has undergone catheter ablation for atrial dysrhythmias in the past. Most recently he has been noted to have ectopic atrial tachycardia. He has been treated medically. The patient is anticoagulated with warfarin and he has not had any bleeding problems.  An echocardiogram was done recently because of his known aortic stenosis and progressive symptoms. This demonstrated findings consistent with severe aortic stenosis as outlined below.  Past Medical History  Diagnosis Date  . Hypertension   . Hyperlipidemia   . Type II or unspecified type diabetes mellitus without mention of complication, not stated as uncontrolled   . Nephrolithiasis   . Atrial flutter     s/p CTI ablation by Dr Taylor 1/12  . CAD (coronary artery disease)     s/p CABG 2003 by Dr Bartle  . Aortic stenosis, moderate     moderate to severe per echo 03/2012  . DJD (degenerative joint disease)   . DDD (degenerative disc disease)   . Spinal stenosis of lumbar region   . Bifascicular block   . Atrial tachycardia     Past Surgical History  Procedure Laterality Date  . Cabg  2003    by Dr Bartle  . Anal fissure repair     . Leg surgery      right  . Trigger finger release      bilat.  . Tonsillectomy    . Back surgery  2010  . Atrial ablation surgery  1/12    atrial flutter ablation by Dr Taylor  . Cardioversion  04/07/2012    Procedure: CARDIOVERSION;  Surgeon: Peter C Nishan, MD;  Location: [facility];  Service: Cardiovascular;  Laterality: N/A;    Family History  Problem Relation Age of Onset  . Heart disease Father   . Heart disease Paternal Grandfather   . Stroke Mother   . Rheum arthritis Mother     History   Social History  . Marital Status: Married    Spouse Name: N/A    Number of Children: Y  . Years of Education: N/A   Occupational History  . pharmacist     retired   Social History Main Topics  . Smoking status: Former Smoker -- 1.00 packs/day for 49 years    Quit date: 08/21/2001  . Smokeless tobacco: Not on file     Comment: started at age 18.   . Alcohol Use: No  . Drug Use: No  . Sexually Active: No   Other Topics Concern  . Not on file   Social History Narrative    He is a retired pharmacist.      (Not in a hospital admission)  Allergies  Allergen Reactions  . Doxycycline     REACTION: swollen tongue    Review   of Systems:  General: NO loss of appetite, NO decreased energy, YES weight gain, NO weight loss, NO fever  Cardiac: NO chest pain with exertion, NO chest pain at rest, YES SOB with exertion, NO resting SOB, NO PND, NO orthopnea, NO palpitations, NO arrhythmia, NO atrial fibrillation, NO LE edema, NO dizzy spells, NO syncope  Respiratory: YES shortness of breath, YES home oxygen, NO productive cough, NO dry cough, NO bronchitis, NO wheezing, NO hemoptysis, NO asthma, NO pain with inspiration or cough, NO sleep apnea, NO CPAP at night  GI: NO difficulty swallowing, NO reflux, NO frequent heartburn, NO hiatal hernia, NO abdominal pain, NO constipation, NO diarrhea, NO hematochezia, NO hematemesis, NO melena  GU: NO dysuria, YES frequency, NO urinary  tract infection, NO hematuria, NO enlarged prostate, YES kidney stones, NO kidney disease  Vascular: NO pain suggestive of claudication, NO pain in feet, NO leg cramps, NO varicose veins, NO DVT, NO non-healing foot ulcer  Neuro: NO stroke, NO TIA's, NO seizures, YES headaches, NOtemporary blindness one eye, NO slurred speech, NO peripheral neuropathy, NO chronic pain, NO instability of gait, NO memory/cognitive dysfunction  Musculoskeletal: NO arthritis, NO joint swelling, YES myalgias, NO difficulty walking, NO mobility  Skin: NO rash, NO itching, NO skin infections, NO pressure sores or ulcerations  Psych: NO anxiety, YES depression, NO nervousness, NO unusual recent stress  Eyes: NO blurry vision, NO floaters, NO recent vision changes, YES wears glasses or contacts  ENT: NO hearing loss, NO loose or painful teeth, YES dentures, last saw dentist 2013  Hematologic: YES easy bruising, NO abnormal bleeding, NOclotting disorder, NO frequent epistaxis  Endocrine: YES diabetes, does check CBG's at home YES   BP 111/58  Pulse 87  Resp 20  Ht 5' 4" (1.626 m)  Wt 195 lb (88.451 kg)  BMI 33.46 kg/m2  SpO2 95%  PHYSICAL EXAM: Pt is alert and oriented, WD, WN, overweight elderly male in no distress. HEENT: normal Neck: JVP normal. Carotid upstrokes delayed bilaterally. No thyromegaly. Lungs: equal expansion, clear bilaterally CV: Apex is discrete and nondisplaced, RRR with grade 3/6 harsh crescendo decrescendo systolic murmur at the right upper sternal border Abd: soft, NT, +BS, no bruit, no hepatosplenomegaly Back: no CVA tenderness Ext: no C/C/E        DP/PT pulses intact and = Skin: warm and dry without rash Neuro: CNII-XII intact             Strength intact = bilaterally  EKG:  Normal sinus rhythm with bifascicular block (right bundle branch block and left anterior fascicular block)  2D ECHO:  Left ventricle: The cavity size was normal. Wall thickness was increased in a pattern of  moderate LVH. Systolic function was normal. The estimated ejection fraction was in the range of 60% to 65%. Wall motion was normal; there were no regional wall motion abnormalities. Doppler parameters are consistent with abnormal left ventricular relaxation (grade 1 diastolic dysfunction).  ------------------------------------------------------------ Aortic valve: Probably trileaflet; moderately thickened, moderately calcified leaflets. Doppler: There was severe stenosis. No regurgitation. VTI ratio of LVOT to aortic valve: 0.25. Peak velocity ratio of LVOT to aortic valve: 0.24. Mean gradient: 41mm Hg (S). Peak gradient: 70mm Hg (S).  ------------------------------------------------------------ Aorta: Aortic root: The aortic root was normal in size. Ascending aorta: The ascending aorta was normal in size.  ------------------------------------------------------------ Mitral valve: Calcified annulus. Moderately thickened leaflets . Doppler: The findings are consistent with trivial stenosis. Valve area by pressure half-time: 2.1cm^2. Indexed valve area by pressure half-time: 1.1cm^2/m^2.   Mean gradient: 4mm Hg (D). Peak gradient: 10mm Hg (D).  ------------------------------------------------------------ Left atrium: The atrium was mildly dilated.  ------------------------------------------------------------ Right ventricle: The cavity size was normal. Wall thickness was normal. Systolic function was normal.  ------------------------------------------------------------ Pulmonic valve: Structurally normal valve. Cusp separation was normal. Doppler: Transvalvular velocity was within the normal range. No regurgitation.  ------------------------------------------------------------ Tricuspid valve: Structurally normal valve. Leaflet separation was normal. Doppler: Transvalvular velocity was within the normal range. Mild  regurgitation.  ------------------------------------------------------------ Right atrium: The atrium was normal in size.  ------------------------------------------------------------ Pericardium: There was no pericardial effusion.  ------------------------------------------------------------  2D measurements Normal Doppler measurements Normal Left ventricle Main pulmonary LVID ED, 37.4 mm 43-52 artery chord, Pressure, 25 mm Hg =30 PLAX S LVID ES, 22.5 mm 23-38 Left ventricle chord, Ea, lat 5.04 cm/s ------ PLAX ann, tiss FS, chord, 40 % >29 DP PLAX E/Ea, lat 21.8 ------ LVPW, ED 18.2 mm ------ ann, tiss 3 IVS/LVPW 0.74 <1.3 DP ratio, ED Ea, med 4.75 cm/s ------ Ventricular septum ann, tiss IVS, ED 13.4 mm ------ DP Aorta E/Ea, med 23.1 ------ Root diam, 32 mm ------ ann, tiss 6 ED DP AAo AP 32 mm ------ LVOT diam, S Peak vel, 100 cm/s ------ Left atrium S AP dim 43 mm ------ VTI, S 21.5 cm ------ AP dim 2.25 cm/m^2 <2.2 Aortic valve index Peak vel, 419 cm/s ------ S Mean vel, 294 cm/s ------ S VTI, S 85 cm ------ Mean 41 mm Hg ------ gradient, S Peak 70 mm Hg ------ gradient, S VTI ratio 0.25 ------ LVOT/AV Peak vel 0.24 ------ ratio, LVOT/AV Mitral valve Peak E vel 110 cm/s ------ Peak A vel 131 cm/s ------ Mean vel, 95 cm/s ------ D Decelerati 348 ms 150-23 on time 0 Pressure 105 ms ------ half-time Mean 4 mm Hg ------ gradient, D Peak 10 mm Hg ------ gradient, D Peak E/A 0.8 ------ ratio Area (PHT) 2.1 cm^2 ------ Area index 1.1 cm^2/m ------ (PHT) ^2 Annulus 37.7 cm ------ VTI Tricuspid valve Regurg 222 cm/s ------ peak vel Peak RV-RA 20 mm Hg ------ gradient, S Systemic veins Estimated 5 mm Hg ------ CVP Right ventricle Pressure, 25 mm Hg <30 S Sa vel, 9.65 cm/s ------ lat ann, tiss DP  STS Risk Calculator  Procedure AVR  Risk of Mortality 3.8%  Morbidity or Mortality 22.3%  Prolonged LOS 9.1%  Short LOS 26.5%   Permanent Stroke 1.76%  Prolonged Vent Support 15.4%  DSW Infection 0.3%  Renal Failure 6.0%  Reoperation 9.0%  Procedure AVR + redo CABG  Risk of Mortality 5.5%  Morbidity or Mortality 29.7%  Prolonged LOS 15.9%  Short LOS 17.8%  Permanent Stroke 2.7%  Prolonged Vent Support 20.1%  DSW Infection 0.88%  Renal Failure 9.7%  Reoperation 11.4%   6 Minute Walk Test Results  Patient: Adam [name]  Date: 08/19/2012  Supplemental O2 during test? NO  Baseline End  Time 14:00 14:06  Heartrate 69 94  Dyspnea MILD MODERATE  Fatigue MILD MODERATE  O2 sat 95% 89%  Blood pressure 115/61 110/68  Patient ambulated at a SLOW pace for a total distance of 500 feet with 1 stop.  Ambulation was limited primarily due to shortness of breath, weakness. Only walked 5 minutes, couldn't complete 6 minute test.  Overall the test was tolerated fair.  Pulmonary Function Tests  Baseline Post-bronchodilator  FVC 2.75 L (88% predicted) FVC 2.82 L (91% predicted)  FEV1 2.05 L (93% predicted) FEV1 2.02 L (91% predicted)  FEF25-75 1.57 L (99% predicted) FEF25-75   1.57 L (99% predicted)  RV 2.26 L (103% predicted)  DLCO 44% predicted   ASSESSMENT AND PLAN:  This is a 77-year-old gentleman with severe symptomatic aortic stenosis. I have reviewed his echocardiogram images in conjunction with Dr. Owen and we evaluated the patient together today in the multidisciplinary valve clinic. His aortic valve clearly has the echocardiographic appearance of a severely calcific, restricted valve. It is difficult to characterize the morphology of the valve because of heavy calcification, but there may be fusion of 2 of the 3 cusps.  We discussed potential treatment options for his aortic stenosis, including consideration of transcatheter aortic valve replacement versus redo open surgical aortic valve replacement. His STS score places him only at moderate risk, and if he were inclined to pursue TAVR he would require  referral to a clinical trial site for a moderate risk trial such as SURTAVI. At this point he favors surgical aortic valve replacement after careful consideration as of the risks and benefit of each treatment option.  The next step in his evaluation will be right and left heart catheterization to evaluate his hemodynamics and graft patency. I have carefully reviewed the potential risks, including vascular injury, bleeding, stroke, myocardial infarction, and death. He understands these risks are low and agrees to proceed. He will be scheduled for next week in the outpatient cardiac cath lab. Further plans pending the results of his cardiac catheterization.  Tiaunna Buford 08/20/2012 12:23 AM   

## 2012-08-24 NOTE — OR Nursing (Signed)
Meal served 

## 2012-08-24 NOTE — OR Nursing (Signed)
Dr Excell Seltzer at bedside to discuss results and treatment plan with pt and wife

## 2012-08-26 ENCOUNTER — Institutional Professional Consult (permissible substitution) (INDEPENDENT_AMBULATORY_CARE_PROVIDER_SITE_OTHER): Payer: Self-pay | Admitting: Surgery

## 2012-08-26 ENCOUNTER — Other Ambulatory Visit: Payer: Self-pay | Admitting: *Deleted

## 2012-08-26 ENCOUNTER — Encounter: Payer: Self-pay | Admitting: Surgery

## 2012-08-26 VITALS — BP 155/77 | HR 88 | Resp 20 | Ht 64.0 in | Wt 195.0 lb

## 2012-08-26 DIAGNOSIS — I359 Nonrheumatic aortic valve disorder, unspecified: Secondary | ICD-10-CM

## 2012-08-26 DIAGNOSIS — Z951 Presence of aortocoronary bypass graft: Secondary | ICD-10-CM

## 2012-08-26 DIAGNOSIS — I2510 Atherosclerotic heart disease of native coronary artery without angina pectoris: Secondary | ICD-10-CM

## 2012-08-26 NOTE — Progress Notes (Signed)
  [address]       [place] [address]             [phone]      PCP is PATERSON,DANIEL G, MD  Referring Provider is Cooper, Michael, MD  Chief Complaint   Patient presents with   .  Aortic Stenosis     Surgical eval, Cardiac Cath 08/24/12, TEE 08/02/12   .  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 He had a left internal mammary graft to the LAD with a saphenous vein graft to the intermediate coronary, a sequential saphenous vein graft to the posterior descending branch of the right coronary and the posterior lateral branch of the left circumflex coronary, and a saphenous vein graft to the obtuse marginal coronary artery. He has done well until the past 6 months when he is noted slowly progressive shortness of breath. He does note it has gotten to the point where he can't do much activity without getting some shortness of breath. He has occasional episodes of chest pressure. A recent echocardiogram showed severe aortic stenosis with a peak velocity of 4.19 meters per second with a peak gradient of 70 mm mercury and a mean gradient of 41 mm mercury. His last echocardiogram prior to this was in October of 2013 and his peak velocity at that time was 4.42 m/s with a mean gradient of 36 mm mercury. Cardiac catheterization was performed recently and showed normal right and left heart pressures. The mean gradient across the aortic valve was measured at 22 with an aortic valve area of 0.9 cm . Coronary angiography showed the left circumflex to be totally occluded at its ostium. The right coronary was totally occluded proximally. The LAD was diffusely diseased proximally and totally occluded before the first septal perforator. The left internal mammary graft to the LAD was widely patent. The vein graft to the intermediate was patent. The vein  graft to the obtuse marginal was diffusely diseased with a 80% stenosis in the midportion with severe distal graft stenosis. The obtuse marginal vessel appeared relatively small with some distal disease in it. The saphenous vein graft to the posterior descending and posterior lateral branches was patent with some mild proximal irregularity most likely due to a valve. The saphenous vein graft to the acute marginal is widely patent. Left ventricular ejection fraction of 65-70% with no mitral regurgitation. Aortic root angiography showed the proximal aorta to be of normal size with some calcification of the mid descending aorta posteriorly as well as some calcified plaque in the aortic root. The aortic valve was heavily calcified with poor leaflet motion. Abdominal aortography showed 80% ostial stenosis of the right internal iliac artery. The right external iliac artery was severely diseased with 99% calcified eccentric stenosis. The left external iliac artery had severe diffuse 80% stenosis with a patent internal iliac artery on that side.  Past Medical History   Diagnosis  Date   .  Hypertension    .  Hyperlipidemia    .  Type II or unspecified type diabetes mellitus without mention of complication, not stated as uncontrolled    .  Nephrolithiasis    .  Atrial flutter      s/p CTI ablation by Dr Taylor 1/12   .  CAD (coronary artery disease)        s/p CABG 2003 by Dr Bartle   .  Aortic stenosis, moderate      moderate to severe per echo 03/2012   .  DJD (degenerative joint disease)    .  DDD (degenerative disc disease)    .  Spinal stenosis of lumbar region    .  Bifascicular block    .  Atrial tachycardia    .  S/P CABG x 5  08/19/2012     CABGx5 by Dr Bartle: LIMA to LAD, SVG to OM, sequential SVG to PDA and LPLB, SVG to AM, EVH and open vein harvest from right thigh and leg    Past Surgical History   Procedure  Laterality  Date   .  Cabg   2003     by Dr Bartle   .  Anal fissure repair       .  Leg surgery       right   .  Trigger finger release       bilat.   .  Tonsillectomy     .  Back surgery   2010   .  Atrial ablation surgery   1/12     atrial flutter ablation by Dr Taylor   .  Cardioversion   04/07/2012     Procedure: CARDIOVERSION; Surgeon: Peter C Nishan, MD; Location: [facility]; Service: Cardiovascular; Laterality: N/A;    Family History   Problem  Relation  Age of Onset   .  Heart disease  Father    .  Heart disease  Paternal Grandfather    .  Stroke  Mother    .  Rheum arthritis  Mother    Social History  History   Substance Use Topics   .  Smoking status:  Former Smoker -- 1.00 packs/day for 49 years     Quit date:  08/21/2001   .  Smokeless tobacco:  Not on file      Comment: started at age 18.   .  Alcohol Use:  No    Current Outpatient Prescriptions   Medication  Sig  Dispense  Refill   .  albuterol (PROVENTIL HFA;VENTOLIN HFA) 108 (90 BASE) MCG/ACT inhaler  Inhale 2 puffs into the lungs every 6 (six) hours as needed for wheezing.  3 Inhaler  4   .  budesonide-formoterol (SYMBICORT) 160-4.5 MCG/ACT inhaler  Inhale 2 puffs into the lungs 2 (two) times daily.     .  carisoprodol (SOMA) 350 MG tablet  Take 350 mg by mouth 4 (four) times daily as needed.     .  cilostazol (PLETAL) 50 MG tablet  Take 1 tablet by mouth Twice daily.     .  HUMALOG 100 UNIT/ML injection  Inject 34 Units into the skin 2 (two) times daily.     .  HUMULIN N 100 UNIT/ML injection  Inject 24 Units into the skin 3 (three) times daily.     .  hydrochlorothiazide (HYDRODIURIL) 25 MG tablet  Take 0.5 tablets (12.5 mg total) by mouth daily.  45 tablet  3   .  HYDROcodone-acetaminophen (NORCO) 5-325 MG per tablet  Take 1 tablet by mouth as needed.     .  LORazepam (ATIVAN) 1 MG tablet  Take 1-2 mg by mouth at bedtime as needed.     .  losartan (COZAAR) 100 MG tablet  Take 1 tablet (100 mg total) by mouth daily.  90 tablet  3   .  metFORMIN (GLUCOPHAGE-XR) 500   MG 24 hr tablet  Take  1,000 mg by mouth at bedtime.     .  metoprolol (LOPRESSOR) 100 MG tablet  Take 1 tablet (100 mg total) by mouth 2 (two) times daily.  90 tablet  3   .  potassium chloride (K-DUR,KLOR-CON) 10 MEQ tablet  Take 1 tablet (10 mEq total) by mouth daily.  90 tablet  3   .  simvastatin (ZOCOR) 10 MG tablet  Take 1 tablet (10 mg total) by mouth at bedtime.  90 tablet  3   .  Tamsulosin HCl (FLOMAX) 0.4 MG CAPS  Take 0.4 mg by mouth at bedtime.     .  venlafaxine (EFFEXOR) 75 MG tablet  Take 150mg in the morning and 75mg at bedtime.     .  zolpidem (AMBIEN) 5 MG tablet  Take 5-10 mg by mouth at bedtime as needed.     .  warfarin (COUMADIN) 5 MG tablet  Take 1 tablet (5 mg total) by mouth as directed.  180 tablet  1    No current facility-administered medications for this visit.    Allergies   Allergen  Reactions   .  Doxycycline      REACTION: swollen tongue   Review of Systems  Constitutional: Positive for activity change, fatigue and unexpected weight change. Negative for fever, chills, diaphoresis and appetite change.  HENT:  Wears dentures  Eyes:  Wears glasses  Respiratory: Positive for shortness of breath.  Cardiovascular: Positive for chest pain. Negative for palpitations and leg swelling.  Gastrointestinal: Negative.  Endocrine: Negative.  Genitourinary: Positive for frequency.  Allergic/Immunologic: Negative.  Neurological: Positive for headaches.  Hematological: Negative.  Psychiatric/Behavioral: Negative.  BP 155/77  Pulse 88  Resp 20  Ht 5' 4" (1.626 m)  Wt 195 lb (88.451 kg)  BMI 33.46 kg/m2  SpO2 91%  Physical Exam  Constitutional: He appears well-developed and well-nourished. No distress.  HENT:  Head: Normocephalic and atraumatic.  Mouth/Throat: Oropharynx is clear and moist.  Eyes: EOM are normal. Pupils are equal, round, and reactive to light.  Neck: Normal range of motion. Neck supple. No JVD present. No thyromegaly present.  Cardiovascular: Normal rate and  regular rhythm.  3/6 systolic murmur over aorta radiating into neck  Pulmonary/Chest: Effort normal. He has rales.  bibasilar  Abdominal: He exhibits no distension and no mass. There is no tenderness.  Musculoskeletal: He exhibits no edema.  Lymphadenopathy:  He has no cervical adenopathy.  Neurological: He has normal strength. No cranial nerve deficit or sensory deficit.  Skin: Skin is warm and dry.  Psychiatric: He has a normal mood and affect.  Diagnostic Tests:  *[facility]* [address] [place], [place] [address] [phone]  ------------------------------------------------------------ Transthoracic Echocardiography  Patient: Rollinson, Thanos D MR #: [id number] Study Date: 08/02/2012 Gender: M Age: 77 Height: 165.1cm Weight: 83.9kg BSA: 1.91m^2 Pt. Status: Room:  ATTENDING Brackbill, Thomas REFERRING Paterson, Daniel G PERFORMING [place] ORDERING Gerhardt, Lori REFERRING Gerhardt, Lori SONOGRAPHER Vanessa Poole, RDCS cc:  ------------------------------------------------------------ LV EF: 60% - 65%  ------------------------------------------------------------ Indications: 424.1 Aortic valve disorders.  ------------------------------------------------------------ History: PMH: Acquired from the patient and from the patient's chart. Harsh outflow Murmur. Atrial flutter. Coronary artery disease. Borderline significant aortic stenosis. Risk factors: Former tobacco use. Hypertension. Diabetes mellitus. Obese. Dyslipidemia.  ------------------------------------------------------------ Study Conclusions  - Left ventricle: The cavity size was normal. Wall thickness was increased in a pattern of moderate LVH. Systolic function was normal. The estimated ejection fraction   was in the range of 60% to 65%. Wall motion was normal; there were no regional wall motion abnormalities. Doppler parameters are consistent with abnormal left  ventricular relaxation (grade 1 diastolic dysfunction). - Aortic valve: There was severe stenosis. Mean gradient: 41mm Hg (S). Peak gradient: 70mm Hg (S). - Mitral valve: Calcified annulus. Moderately thickened leaflets . The findings are consistent with trivial stenosis. Mean gradient: 4mm Hg (D). Peak gradient: 10mm Hg (D). Valve area by pressure half-time: 2.1cm^2. - Left atrium: The atrium was mildly dilated.  ------------------------------------------------------------ Labs, prior tests, procedures, and surgery: Echocardiography (October 2013). The aortic valve showed moderate to severe stenosis. EF was 55%. Aortic valve: peak gradient of 78mm Hg and mean gradient of 36mm Hg.  Electrophysiology study with ablation (2012). Atrial flutter ablation. Coronary artery bypass grafting. Transthoracic echocardiography. M-mode, complete 2D, spectral Doppler, and color Doppler. Height: Height: 165.1cm. Height: 65in. Weight: Weight: 83.9kg. Weight: 184.6lb. Body mass index: BMI: 30.8kg/m^2. Body surface area: BSA: 1.91m^2. Blood pressure: 130/75. Patient status: Outpatient. Location: [facility]  ------------------------------------------------------------  ------------------------------------------------------------ Left ventricle: The cavity size was normal. Wall thickness was increased in a pattern of moderate LVH. Systolic function was normal. The estimated ejection fraction was in the range of 60% to 65%. Wall motion was normal; there were no regional wall motion abnormalities. Doppler parameters are consistent with abnormal left ventricular relaxation (grade 1 diastolic dysfunction).  ------------------------------------------------------------ Aortic valve: Probably trileaflet; moderately thickened, moderately calcified leaflets. Doppler: There was severe stenosis. No regurgitation. VTI ratio of LVOT to aortic valve: 0.25. Peak velocity ratio of LVOT to aortic valve:  0.24. Mean gradient: 41mm Hg (S). Peak gradient: 70mm Hg (S).  ------------------------------------------------------------ Aorta: Aortic root: The aortic root was normal in size. Ascending aorta: The ascending aorta was normal in size.  ------------------------------------------------------------ Mitral valve: Calcified annulus. Moderately thickened leaflets . Doppler: The findings are consistent with trivial stenosis. Valve area by pressure half-time: 2.1cm^2. Indexed valve area by pressure half-time: 1.1cm^2/m^2. Mean gradient: 4mm Hg (D). Peak gradient: 10mm Hg (D).  ------------------------------------------------------------ Left atrium: The atrium was mildly dilated.  ------------------------------------------------------------ Right ventricle: The cavity size was normal. Wall thickness was normal. Systolic function was normal.  ------------------------------------------------------------ Pulmonic valve: Structurally normal valve. Cusp separation was normal. Doppler: Transvalvular velocity was within the normal range. No regurgitation.  ------------------------------------------------------------ Tricuspid valve: Structurally normal valve. Leaflet separation was normal. Doppler: Transvalvular velocity was within the normal range. Mild regurgitation.  ------------------------------------------------------------ Right atrium: The atrium was normal in size.  ------------------------------------------------------------ Pericardium: There was no pericardial effusion.  ------------------------------------------------------------  2D measurements Normal Doppler measurements Normal Left ventricle Main pulmonary LVID ED, 37.4 mm 43-52 artery chord, Pressure, 25 mm Hg =30 PLAX S LVID ES, 22.5 mm 23-38 Left ventricle chord, Ea, lat 5.04 cm/s ------ PLAX ann, tiss FS, chord, 40 % >29 DP PLAX E/Ea, lat 21.8 ------ LVPW, ED 18.2 mm ------ ann, tiss 3 IVS/LVPW 0.74 <1.3  DP ratio, ED Ea, med 4.75 cm/s ------ Ventricular septum ann, tiss IVS, ED 13.4 mm ------ DP Aorta E/Ea, med 23.1 ------ Root diam, 32 mm ------ ann, tiss 6 ED DP AAo AP 32 mm ------ LVOT diam, S Peak vel, 100 cm/s ------ Left atrium S AP dim 43 mm ------ VTI, S 21.5 cm ------ AP dim 2.25 cm/m^2 <2.2 Aortic valve index Peak vel, 419 cm/s ------ S Mean vel, 294 cm/s ------ S VTI, S 85 cm ------ Mean 41 mm Hg ------ gradient, S Peak 70 mm Hg ------ gradient, S VTI ratio   0.25 ------ LVOT/AV Peak vel 0.24 ------ ratio, LVOT/AV Mitral valve Peak E vel 110 cm/s ------ Peak A vel 131 cm/s ------ Mean vel, 95 cm/s ------ D Decelerati 348 ms 150-23 on time 0 Pressure 105 ms ------ half-time Mean 4 mm Hg ------ gradient, D Peak 10 mm Hg ------ gradient, D Peak E/A 0.8 ------ ratio Area (PHT) 2.1 cm^2 ------ Area index 1.1 cm^2/m ------ (PHT) ^2 Annulus 37.7 cm ------ VTI Tricuspid valve Regurg 222 cm/s ------ peak vel Peak RV-RA 20 mm Hg ------ gradient, S Systemic veins Estimated 5 mm Hg ------ CVP Right ventricle Pressure, 25 mm Hg <30 S Sa vel, 9.65 cm/s ------ lat ann, tiss DP  ------------------------------------------------------------ Prepared and Electronically Authenticated by  Brackbill, Thomas 2014-02-11T14:[id number]  CARDIAC CATH:  Procedural Findings:  Hemodynamics  RA 8  RV 38/14  PA 41/18 with a mean of 28  PCWP 17  LV 148/26  AO 124/52 with a mean of 80  Oxygen saturations:  PA 62  AO 97  Cardiac Output (Fick) 3.7  Cardiac Index (Fick) 1.9  Aortic valve hemodynamics: Mean gradient 22, aortic valve area 0.9 cm    Coronary angiography:  Coronary dominance: right  Left mainstem: The left main is moderately calcified. The distal vessel has 40% stenosis.  Left anterior descending (LAD): The proximal LAD is diffusely diseased. The vessel is totally occluded before the first septal perforator.  Left circumflex (LCx): The  circumflex is totally occluded. The occlusion is at the ostium of the vessel. There is an intermediate branch is patent with severe distal disease.  Right coronary artery (RCA): The RCA is totally occluded in its proximal aspect. There are some bridging collaterals to the mid vessel.  Graft angiography:  LIMA to LAD: Widely patent throughout.  Saphenous vein graft to OM 1: Severe 80% stenosis in the mid body of the graft. Severe distal graft stenosis with marked degeneration and irregularity and 90% associated stenosis.  Saphenous vein graft sequential to PDA and PLA branches of the RCA: Widely patent throughout. The graft as mild stenosis probably in the valve in the proximal to mid body. Otherwise the graft is widely patent. The distal branch vessels are diffusely diseased but there are no areas of severe flow limiting stenosis.  Saphenous vein graft to acute marginal branch: Widely patent with no significant stenosis.  Left ventriculography: Left ventricular function is vigorous. The LVEF is estimated at 65-70%. There is no significant mitral regurgitation  Aortic root angiography: The proximal aorta is not dilated. The aortic valve is heavily calcified with restricted movement. There is mild aortic insufficiency present.  Abdominal aortic angiography: The distal abdominal aorta is patent without significant stenosis. The IMA is patent. The right common iliac artery is patent. The the internal iliac artery has 80% ostial stenosis with a normal flow pattern. The external iliac is severely diseased with very tight 99% calcified eccentric stenosis. The left iliac femoral artery is patent. The left external iliac artery has severe diffuse 80% stenosis. The internal iliac is patent. The iliac arteries are severely calcified bilaterally.  Final Conclusions:  1. Severe native three-vessel coronary artery disease with total occlusion of the RCA, LAD, and left circumflex  2. Status post aorta coronary bypass  surgery with continued patency of the LIMA to LAD, saphenous vein graft sequential to PDA and PLA branches of the RCA, and saphenous vein graft to the acute marginal.  3. 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   Normal left ventricular systolic function  5. Severe but not critical aortic stenosis  6. Severe iliofemoral disease bilaterally  Michael Cooper  08/24/2012, 9:31 AM  Impression/Plan:  He has severe aortic stenosis with progressive exertional dyspnea. Cardiac catheterization shows that his left internal mammary graft and 3 of his 4 vein grafts are still widely patent and looked good. The vein graft to the obtuse marginal branch is diffusely and severely diseased. This a relatively small vessel with some distal disease in it. I reviewed his old operative note and I noted at the time of surgery that the obtuse marginal branch was only visible proximally and then became intramyocardial throughout its remaining extent. I doubt that it would be suitable for grafting under redo circumstances. I told him that I could evaluate that further in the operating room. I think aortic valve replacement is indicated to prevent worsening symptoms and left ventricular dysfunction. I would plan to use a tissue valve given his age. He has been on Coumadin for atrial flutter but I still think a tissue valve would be the best option for him at his age. I don't think there is any indication to attempt a Maze procedure under redo circumstances at 77 years old with patent coronary grafts. I discussed the operative procedure with the patient and family including alternatives, benefits and risks; including but not limited to bleeding, blood transfusion, infection, stroke, myocardial infarction, graft failure, heart block requiring a permanent pacemaker, organ dysfunction, and death. Precious D Shrieves understands and agrees to proceed. We will schedule surgery for next Thursday 09/01/2012.       Mean vel,    95 cm/s   ------                                D                                 Decelerati  348 ms     150-23                                on time                0                                Pressure    105 ms     ------                                half-time                                Mean          4 mm Hg  ------                                gradient,                                D                                Peak         10 mm Hg  ------                                gradient,                                D                                Peak E/A    0.8        ------                                ratio                                Area (PHT)  2.1 cm^2   ------                                Area index  1.1 cm^2/m ------                                (  PHT)           ^2                                Annulus    37.7 cm     ------                                VTI                                Tricuspid valve                                Regurg      222 cm/s   ------                                peak vel                                Peak RV-RA   20 mm Hg  ------                                gradient,                                S                                Systemic veins                                Estimated     5 mm Hg  ------                                CVP                                Right ventricle                                Pressure,    25 mm Hg  <30                                S                                Sa vel,    9.65 cm/s   ------                                lat ann,  tiss DP   ------------------------------------------------------------ Prepared and Electronically Authenticated by  Cassell Clement 2014-02-11T14:[id number]   CARDIAC CATH:   Procedural Findings: Hemodynamics RA 8 RV 38/14 PA 41/18 with a mean of 28 PCWP 17 LV 148/26 AO 124/52 with a mean of 80  Oxygen saturations: PA 62 AO 97  Cardiac Output (Fick) 3.7   Cardiac Index (Fick) 1.9  Aortic valve hemodynamics: Mean gradient 22, aortic valve area 0.9 cm         Coronary angiography: Coronary dominance: right  Left mainstem: The left main is moderately calcified. The distal vessel has 40% stenosis.  Left anterior descending (LAD): The proximal LAD is diffusely diseased. The vessel is totally occluded before the first septal perforator.  Left circumflex (LCx): The circumflex is totally occluded. The occlusion is at the ostium of the vessel. There is an intermediate branch is patent with severe distal disease.  Right coronary artery (RCA): The RCA is totally occluded in its proximal aspect. There are some bridging collaterals to the mid vessel.  Graft angiography: LIMA to LAD: Widely patent throughout.  Saphenous vein graft to OM 1: Severe 80% stenosis in the mid body of the graft. Severe distal graft stenosis with marked degeneration and irregularity and 90% associated stenosis.  Saphenous vein graft sequential to PDA and PLA branches of the RCA: Widely patent throughout. The graft as mild stenosis probably in the valve in the proximal to mid body. Otherwise the graft is widely patent. The distal branch vessels are diffusely diseased but there are no areas of severe flow limiting stenosis.  Saphenous vein graft to acute marginal branch: Widely patent with no significant stenosis.  Left ventriculography: Left ventricular function is vigorous. The LVEF is estimated at 65-70%. There is no significant mitral regurgitation  Aortic root angiography: The proximal aorta is not dilated. The aortic valve is heavily calcified with restricted movement. There is mild aortic insufficiency present.  Abdominal aortic angiography: The distal abdominal aorta is patent without significant stenosis. The IMA is patent. The right common iliac artery is patent. The the internal iliac artery has 80% ostial stenosis with a normal flow pattern. The external iliac is  severely diseased with very tight 99% calcified eccentric stenosis. The left iliac femoral artery is patent. The left external iliac artery has severe diffuse 80% stenosis. The internal iliac is patent. The iliac arteries are severely calcified bilaterally.  Final Conclusions:   1. Severe native three-vessel coronary artery disease with total occlusion of the RCA, LAD, and left circumflex 2. Status post aorta coronary bypass surgery with continued patency of the LIMA to LAD, saphenous vein graft sequential to PDA and PLA branches of the RCA, and saphenous vein graft to the acute marginal. 3. 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 Normal left ventricular systolic function 5. Severe but not critical aortic stenosis 6. Severe iliofemoral disease bilaterally  Tonny Bollman 08/24/2012, 9:31 AM   Impression/Plan:  He has severe aortic stenosis with progressive exertional dyspnea. Cardiac catheterization shows that his left internal mammary graft and 3 of his 4 vein grafts are still widely patent and looked good. The vein graft to the obtuse marginal branch is diffusely and severely diseased. This a relatively small vessel with some distal disease in it. I reviewed his old operative note and I noted at the time of surgery that the obtuse marginal branch was only visible proximally and then became intramyocardial throughout its remaining extent. I doubt that it would be suitable  for grafting under redo circumstances. I told him that I could evaluate that further in the operating room. I think aortic valve replacement is indicated to prevent worsening symptoms and left ventricular dysfunction. I would plan to use a tissue valve given his age. He has been on Coumadin for atrial flutter but I still think a tissue valve would be the best option for him at his age. I don't think there is any indication to attempt a Maze procedure under redo circumstances at 78 years old with patent  coronary grafts. I discussed the operative procedure with the patient and family including alternatives, benefits and risks; including but not limited to bleeding, blood transfusion, infection, stroke, myocardial infarction, graft failure, heart block requiring a permanent pacemaker, organ dysfunction, and death.  [name] understands and agrees to proceed.  We will schedule surgery for next Thursday 09/01/2012.

## 2012-08-29 ENCOUNTER — Telehealth: Payer: Self-pay | Admitting: *Deleted

## 2012-08-29 ENCOUNTER — Encounter (HOSPITAL_COMMUNITY): Payer: Self-pay

## 2012-08-29 NOTE — Telephone Encounter (Signed)
Completed heart surgery education.  Book given.  All questions answered.  Told to call office if any other concerns come up.

## 2012-08-30 ENCOUNTER — Encounter (HOSPITAL_COMMUNITY): Payer: Self-pay

## 2012-08-31 ENCOUNTER — Encounter (HOSPITAL_COMMUNITY)
Admission: RE | Admit: 2012-08-31 | Discharge: 2012-08-31 | Disposition: A | Discharge status: Home / Self Care | Payer: Medicare Other | Source: Ambulatory Visit | Attending: Surgery | Admitting: Surgery

## 2012-08-31 ENCOUNTER — Ambulatory Visit (HOSPITAL_COMMUNITY)
Admission: RE | Admit: 2012-08-31 | Discharge: 2012-08-31 | Disposition: A | Discharge status: Home / Self Care | Payer: Medicare Other | Source: Ambulatory Visit | Attending: Surgery | Admitting: Surgery

## 2012-08-31 ENCOUNTER — Encounter (HOSPITAL_COMMUNITY): Payer: Self-pay

## 2012-08-31 VITALS — BP 106/68 | HR 70 | Temp 98.5°F | Resp 20 | Ht 64.0 in | Wt 195.0 lb

## 2012-08-31 DIAGNOSIS — I359 Nonrheumatic aortic valve disorder, unspecified: Secondary | ICD-10-CM

## 2012-08-31 DIAGNOSIS — Z01810 Encounter for preprocedural cardiovascular examination: Secondary | ICD-10-CM

## 2012-08-31 DIAGNOSIS — Z951 Presence of aortocoronary bypass graft: Secondary | ICD-10-CM

## 2012-08-31 DIAGNOSIS — I2510 Atherosclerotic heart disease of native coronary artery without angina pectoris: Secondary | ICD-10-CM

## 2012-08-31 DIAGNOSIS — E669 Obesity, unspecified: Secondary | ICD-10-CM

## 2012-08-31 HISTORY — DX: Major depressive disorder, single episode, unspecified: F32.9

## 2012-08-31 HISTORY — DX: Anxiety disorder, unspecified: F41.9

## 2012-08-31 HISTORY — DX: Depression, unspecified: F32.A

## 2012-08-31 LAB — COMPREHENSIVE METABOLIC PANEL
ALT: 15 U/L (ref 0–53)
Albumin: 3.3 g/dL — ABNORMAL LOW (ref 3.5–5.2)
Calcium: 9.4 mg/dL (ref 8.4–10.5)
GFR calc Af Amer: >90 mL/min (ref >90)
Glucose, Bld: 87 mg/dL (ref 70–99)
Sodium: 139 mEq/L (ref 135–145)
Total Protein: 7.5 g/dL (ref 6.0–8.3)

## 2012-08-31 LAB — URINALYSIS, ROUTINE W REFLEX MICROSCOPIC
Bilirubin Urine: NEGATIVE
Hgb urine dipstick: NEGATIVE
Specific Gravity, Urine: 1.017 (ref 1.005–1.030)
pH: 5 (ref 5.0–8.0)

## 2012-08-31 LAB — BLOOD GAS, ARTERIAL
Bicarbonate: 23.5 mEq/L (ref 20.0–24.0)
Drawn by: 206361
O2 Saturation: 97.2 %
Patient temperature: 98.6
pH, Arterial: 7.432 (ref 7.350–7.450)

## 2012-08-31 LAB — URINE MICROSCOPIC-ADD ON

## 2012-08-31 LAB — CBC
Hemoglobin: 13.6 g/dL (ref 13.0–17.0)
MCH: 29.6 pg (ref 26.0–34.0)
MCHC: 34.6 g/dL (ref 30.0–36.0)
RDW: 13.6 % (ref 11.5–15.5)

## 2012-08-31 LAB — APTT: aPTT: 34 seconds (ref 24–37)

## 2012-08-31 LAB — PROTIME-INR
INR: 0.96 (ref 0.00–1.49)
Prothrombin Time: 12.7 seconds (ref 11.6–15.2)

## 2012-08-31 LAB — SURGICAL PCR SCREEN: Staphylococcus aureus: NEGATIVE

## 2012-08-31 MED ORDER — SODIUM CHLORIDE 0.9 % IV SOLN
INTRAVENOUS | Status: DC
  Filled 2012-08-31: qty 1

## 2012-08-31 MED ORDER — VERAPAMIL HCL 2.5 MG/ML IV SOLN
INTRAVENOUS | Status: DC
  Filled 2012-08-31: qty 2.5

## 2012-08-31 MED ORDER — DEXTROSE 5 % IV SOLN
1.5000 g | INTRAVENOUS | Status: DC
  Filled 2012-08-31: qty 1.5

## 2012-08-31 MED ORDER — EPINEPHRINE HCL 1 MG/ML IJ SOLN
0.5000 ug/min | INTRAVENOUS | Status: DC
  Filled 2012-08-31: qty 4

## 2012-08-31 MED ORDER — PHENYLEPHRINE HCL 10 MG/ML IJ SOLN
30.0000 ug/min | INTRAVENOUS | Status: DC
  Filled 2012-08-31: qty 2

## 2012-08-31 MED ORDER — NITROGLYCERIN IN D5W 200-5 MCG/ML-% IV SOLN
2.0000 ug/min | INTRAVENOUS | Status: DC
  Filled 2012-08-31: qty 250

## 2012-08-31 MED ORDER — METOPROLOL TARTRATE 12.5 MG HALF TABLET
12.5000 mg | ORAL_TABLET | Freq: Once | ORAL | Status: DC
  Filled 2012-08-31: qty 1

## 2012-08-31 MED ORDER — MAGNESIUM SULFATE 50 % IJ SOLN
40.0000 meq | INTRAMUSCULAR | Status: DC
  Filled 2012-08-31: qty 10

## 2012-08-31 MED ORDER — SODIUM CHLORIDE 0.9 % IV SOLN
INTRAVENOUS | Status: DC
  Filled 2012-08-31: qty 40

## 2012-08-31 MED ORDER — DEXTROSE 5 % IV SOLN
750.0000 mg | INTRAVENOUS | Status: DC
  Filled 2012-08-31: qty 750

## 2012-08-31 MED ORDER — POTASSIUM CHLORIDE 2 MEQ/ML IV SOLN
80.0000 meq | INTRAVENOUS | Status: DC
  Filled 2012-08-31: qty 40

## 2012-08-31 MED ORDER — DOPAMINE-DEXTROSE 3.2-5 MG/ML-% IV SOLN
2.0000 ug/kg/min | INTRAVENOUS | Status: DC
  Filled 2012-08-31: qty 250

## 2012-08-31 MED ORDER — VANCOMYCIN HCL 10 G IV SOLR
1250.0000 mg | INTRAVENOUS | Status: DC
  Filled 2012-08-31: qty 1250

## 2012-08-31 MED ORDER — DEXMEDETOMIDINE HCL IN NACL 400 MCG/100ML IV SOLN
0.1000 ug/kg/h | INTRAVENOUS | Status: DC
  Filled 2012-08-31: qty 100

## 2012-08-31 NOTE — Progress Notes (Addendum)
VASCULAR LAB PRELIMINARY  PRELIMINARY  PRELIMINARY  PRELIMINARY  Pre-op Cardiac Surgery  Carotid Findings:  Right:  40-59% ICA stenosis.  Left:  No evidence of significant ICA stenosis.  Bilateral:  Vertebral artery flow is antegrade.  Upper Extremity Right Left  Brachial Pressures 126 T 135 T  Radial Waveforms T T  Ulnar Waveforms T T  Palmar Arch (Allen's Test) WNL WNL   Findings:  Doppler waveforms remain normal with ulnar and radial compressions bilaterally.    Lower  Extremity Right Left  Dorsalis Pedis 70 M 86 M  Anterior Tibial    Posterior Tibial 64 M 85 M  Ankle/Brachial Indices 0.52 0.64    Findings:  ABI indicates a moderate reduction in arterial flow bilaterally.   BIGGS, SANDRA, RVT 08/31/2012, 3:46 PM

## 2012-08-31 NOTE — Pre-Procedure Instructions (Signed)
Adam [name]  08/31/2012   Your procedure is scheduled on:  09/01/12  Report to [facility] at 530 AM.  Call this number if you have problems the morning of surgery: [phone]   Remember:   Do not eat food or drink liquids after midnight.   Take these medicines the morning of surgery with A SIP OF WATER: all inhalers,pain meds,ativan,metoprolol,flomax   Do not wear jewelry, make-up or nail polish.  Do not wear lotions, powders, or perfumes. You may wear deodorant.  Do not shave 48 hours prior to surgery. Men may shave face and neck.  Do not bring valuables to the hospital.  Contacts, dentures or bridgework may not be worn into surgery.  Leave suitcase in the car. After surgery it may be brought to your room.  For patients admitted to the hospital, checkout time is 11:00 AM the day of  discharge.   Patients discharged the day of surgery will not be allowed to drive  home.  Name and phone number of your driver: family  Special Instructions: Shower using CHG 2 nights before surgery and the night before surgery.  If you shower the day of surgery use CHG.  Use special wash - you have one bottle of CHG for all showers.  You should use approximately 1/3 of the bottle for each shower.   Please read over the following fact sheets that you were given: Pain Booklet, Coughing and Deep Breathing, Blood Transfusion Information, Open Heart Packet, MRSA Information and Surgical Site Infection Prevention

## 2012-09-01 ENCOUNTER — Inpatient Hospital Stay (HOSPITAL_COMMUNITY)
Admission: RE | Admit: 2012-09-01 | Discharge: 2012-09-06 | DRG: 220 | Disposition: A | Discharge status: Home / Self Care | Payer: Medicare Other | Source: Ambulatory Visit | Attending: Surgery | Admitting: Surgery

## 2012-09-01 ENCOUNTER — Encounter (HOSPITAL_COMMUNITY)
Admission: RE | Disposition: A | Discharge status: Home / Self Care | Payer: Self-pay | Source: Ambulatory Visit | Attending: Surgery

## 2012-09-01 ENCOUNTER — Encounter (HOSPITAL_COMMUNITY): Payer: Self-pay | Admitting: *Deleted

## 2012-09-01 ENCOUNTER — Encounter (HOSPITAL_COMMUNITY): Payer: Self-pay | Admitting: Anesthesiology

## 2012-09-01 ENCOUNTER — Inpatient Hospital Stay (HOSPITAL_COMMUNITY): Payer: Medicare Other

## 2012-09-01 ENCOUNTER — Inpatient Hospital Stay (HOSPITAL_COMMUNITY): Payer: Medicare Other | Admitting: Anesthesiology

## 2012-09-01 DIAGNOSIS — I2510 Atherosclerotic heart disease of native coronary artery without angina pectoris: Secondary | ICD-10-CM | POA: Diagnosis present

## 2012-09-01 DIAGNOSIS — E785 Hyperlipidemia, unspecified: Secondary | ICD-10-CM | POA: Diagnosis present

## 2012-09-01 DIAGNOSIS — Z794 Long term (current) use of insulin: Secondary | ICD-10-CM

## 2012-09-01 DIAGNOSIS — Z7901 Long term (current) use of anticoagulants: Secondary | ICD-10-CM

## 2012-09-01 DIAGNOSIS — I10 Essential (primary) hypertension: Secondary | ICD-10-CM | POA: Diagnosis present

## 2012-09-01 DIAGNOSIS — Z87891 Personal history of nicotine dependence: Secondary | ICD-10-CM

## 2012-09-01 DIAGNOSIS — I359 Nonrheumatic aortic valve disorder, unspecified: Secondary | ICD-10-CM

## 2012-09-01 DIAGNOSIS — Z951 Presence of aortocoronary bypass graft: Secondary | ICD-10-CM

## 2012-09-01 DIAGNOSIS — D62 Acute posthemorrhagic anemia: Secondary | ICD-10-CM | POA: Diagnosis not present

## 2012-09-01 DIAGNOSIS — F0390 Unspecified dementia without behavioral disturbance: Secondary | ICD-10-CM | POA: Diagnosis present

## 2012-09-01 DIAGNOSIS — I4892 Unspecified atrial flutter: Secondary | ICD-10-CM | POA: Diagnosis present

## 2012-09-01 HISTORY — PX: AORTIC VALVE REPLACEMENT: SHX41

## 2012-09-01 HISTORY — PX: INTRAOPERATIVE TRANSESOPHAGEAL ECHOCARDIOGRAM: SHX5062

## 2012-09-01 LAB — POCT I-STAT 3, ART BLOOD GAS (G3+)
Acid-base deficit: 2 mmol/L (ref 0.0–2.0)
Acid-base deficit: 4 mmol/L — ABNORMAL HIGH (ref 0.0–2.0)
Bicarbonate: 26.3 mEq/L — ABNORMAL HIGH (ref 20.0–24.0)
Bicarbonate: 24.4 mEq/L — ABNORMAL HIGH (ref 20.0–24.0)
Bicarbonate: 24.5 mEq/L — ABNORMAL HIGH (ref 20.0–24.0)
O2 Saturation: 93 %
Patient temperature: 35.2
Patient temperature: 36.6
TCO2: 26 mmol/L (ref 0–100)
TCO2: 22 mmol/L (ref 0–100)
pCO2 arterial: 52.2 mmHg — ABNORMAL HIGH (ref 35.0–45.0)
pCO2 arterial: 42.6 mmHg (ref 35.0–45.0)
pH, Arterial: 7.311 — ABNORMAL LOW (ref 7.350–7.450)
pH, Arterial: 7.366 (ref 7.350–7.450)
pH, Arterial: 7.308 — ABNORMAL LOW (ref 7.350–7.450)
pH, Arterial: 7.345 — ABNORMAL LOW (ref 7.350–7.450)
pO2, Arterial: 287 mmHg — ABNORMAL HIGH (ref 80.0–100.0)
pO2, Arterial: 75 mmHg — ABNORMAL LOW (ref 80.0–100.0)

## 2012-09-01 LAB — PLATELET COUNT: Platelets: 215 10*3/uL (ref 150–400)

## 2012-09-01 LAB — POCT I-STAT 4, (NA,K, GLUC, HGB,HCT)
Glucose, Bld: 96 mg/dL (ref 70–99)
Glucose, Bld: 116 mg/dL — ABNORMAL HIGH (ref 70–99)
Glucose, Bld: 141 mg/dL — ABNORMAL HIGH (ref 70–99)
HCT: 31 % — ABNORMAL LOW (ref 39.0–52.0)
HCT: 26 % — ABNORMAL LOW (ref 39.0–52.0)
HCT: 29 % — ABNORMAL LOW (ref 39.0–52.0)
Hemoglobin: 11.9 g/dL — ABNORMAL LOW (ref 13.0–17.0)
Hemoglobin: 8.8 g/dL — ABNORMAL LOW (ref 13.0–17.0)
Hemoglobin: 9.9 g/dL — ABNORMAL LOW (ref 13.0–17.0)
Potassium: 3.8 mEq/L (ref 3.5–5.1)
Potassium: 5.2 mEq/L — ABNORMAL HIGH (ref 3.5–5.1)
Potassium: 4.1 mEq/L (ref 3.5–5.1)
Sodium: 139 mEq/L (ref 135–145)
Sodium: 136 mEq/L (ref 135–145)
Sodium: 140 mEq/L (ref 135–145)

## 2012-09-01 LAB — CREATININE, SERUM
Creatinine, Ser: 0.78 mg/dL (ref 0.50–1.35)
GFR calc non Af Amer: 86 mL/min — ABNORMAL LOW (ref >90)

## 2012-09-01 LAB — HEMOGLOBIN AND HEMATOCRIT, BLOOD
HCT: 27.2 % — ABNORMAL LOW (ref 39.0–52.0)
Hemoglobin: 9.4 g/dL — ABNORMAL LOW (ref 13.0–17.0)

## 2012-09-01 LAB — CBC
HCT: 31.7 % — ABNORMAL LOW (ref 39.0–52.0)
Hemoglobin: 10.7 g/dL — ABNORMAL LOW (ref 13.0–17.0)
Platelets: 215 10*3/uL (ref 150–400)
RBC: 3.66 MIL/uL — ABNORMAL LOW (ref 4.22–5.81)
RDW: 13.6 % (ref 11.5–15.5)
WBC: 17.9 10*3/uL — ABNORMAL HIGH (ref 4.0–10.5)
WBC: 15 10*3/uL — ABNORMAL HIGH (ref 4.0–10.5)

## 2012-09-01 LAB — POCT I-STAT, CHEM 8
Calcium, Ion: 1.09 mmol/L — ABNORMAL LOW (ref 1.13–1.30)
Chloride: 108 mEq/L (ref 96–112)
Creatinine, Ser: 0.7 mg/dL (ref 0.50–1.35)
Glucose, Bld: 184 mg/dL — ABNORMAL HIGH (ref 70–99)
HCT: 29 % — ABNORMAL LOW (ref 39.0–52.0)

## 2012-09-01 LAB — PROTIME-INR
INR: 1.24 (ref 0.00–1.49)
Prothrombin Time: 15.4 seconds — ABNORMAL HIGH (ref 11.6–15.2)

## 2012-09-01 LAB — APTT: aPTT: 43 seconds — ABNORMAL HIGH (ref 24–37)

## 2012-09-01 LAB — GLUCOSE, CAPILLARY
Glucose-Capillary: 104 mg/dL — ABNORMAL HIGH (ref 70–99)
Glucose-Capillary: 99 mg/dL (ref 70–99)

## 2012-09-01 LAB — MAGNESIUM: Magnesium: 2.7 mg/dL — ABNORMAL HIGH (ref 1.5–2.5)

## 2012-09-01 LAB — HEMOGLOBIN A1C: Hgb A1c MFr Bld: 6.2 % — ABNORMAL HIGH (ref <5.7)

## 2012-09-01 SURGERY — REPLACEMENT, AORTIC VALVE, OPEN
Anesthesia: General | Site: Chest | Wound class: Clean

## 2012-09-01 MED ORDER — METOCLOPRAMIDE HCL 5 MG/ML IJ SOLN
10.0000 mg | Freq: Four times a day (QID) | INTRAMUSCULAR | Status: DC
  Filled 2012-09-01 (×4): qty 2

## 2012-09-01 MED ORDER — MORPHINE SULFATE 2 MG/ML IJ SOLN
2.0000 mg | INTRAMUSCULAR | Status: DC | PRN
  Administered 2012-09-02 (×2): 4 mg via INTRAVENOUS
  Filled 2012-09-01 (×3): qty 2

## 2012-09-01 MED ORDER — SODIUM CHLORIDE 0.9 % IJ SOLN: 3.0000 mL | INTRAMUSCULAR | Status: DC | PRN

## 2012-09-01 MED ORDER — POTASSIUM CHLORIDE 10 MEQ/50ML IV SOLN: 10.0000 meq | INTRAVENOUS | Status: AC

## 2012-09-01 MED ORDER — SODIUM CHLORIDE 0.9 % IJ SOLN: OROMUCOSAL | Status: DC | PRN

## 2012-09-01 MED ORDER — VANCOMYCIN HCL IN DEXTROSE 1-5 GM/200ML-% IV SOLN
1000.0000 mg | Freq: Once | INTRAVENOUS | Status: AC
  Administered 2012-09-01: 1000 mg via INTRAVENOUS
  Filled 2012-09-01: qty 200

## 2012-09-01 MED ORDER — METOPROLOL TARTRATE 1 MG/ML IV SOLN
2.5000 mg | INTRAVENOUS | Status: DC | PRN
  Administered 2012-09-01 – 2012-09-03 (×3): 5 mg via INTRAVENOUS
  Filled 2012-09-01: qty 5

## 2012-09-01 MED ORDER — SODIUM CHLORIDE 0.9 % IV SOLN
INTRAVENOUS | Status: DC | PRN
  Administered 2012-09-01: 14:00:00 via INTRAVENOUS

## 2012-09-01 MED ORDER — SODIUM CHLORIDE 0.9 % IV SOLN: 250.0000 mL | INTRAVENOUS | Status: DC

## 2012-09-01 MED ORDER — PROPOFOL 10 MG/ML IV BOLUS
INTRAVENOUS | Status: DC | PRN
  Administered 2012-09-01: 40 mg via INTRAVENOUS

## 2012-09-01 MED ORDER — FENTANYL CITRATE 0.05 MG/ML IJ SOLN
INTRAMUSCULAR | Status: DC | PRN
  Administered 2012-09-01: 50 ug via INTRAVENOUS
  Administered 2012-09-01: 100 ug via INTRAVENOUS
  Administered 2012-09-01: 200 ug via INTRAVENOUS
  Administered 2012-09-01: 150 ug via INTRAVENOUS
  Administered 2012-09-01: 200 ug via INTRAVENOUS
  Administered 2012-09-01: 50 ug via INTRAVENOUS
  Administered 2012-09-01: 100 ug via INTRAVENOUS

## 2012-09-01 MED ORDER — DEXTROSE 5 % IV SOLN
1.5000 g | INTRAVENOUS | Status: DC | PRN
  Administered 2012-09-01: 1.5 g via INTRAVENOUS

## 2012-09-01 MED ORDER — METOCLOPRAMIDE HCL 5 MG/ML IJ SOLN
10.0000 mg | Freq: Four times a day (QID) | INTRAMUSCULAR | Status: AC
  Administered 2012-09-01 – 2012-09-02 (×4): 10 mg via INTRAVENOUS
  Filled 2012-09-01 (×4): qty 2

## 2012-09-01 MED ORDER — BISACODYL 10 MG RE SUPP: 10.0000 mg | Freq: Every day | RECTAL | Status: DC

## 2012-09-01 MED ORDER — NITROGLYCERIN IN D5W 200-5 MCG/ML-% IV SOLN: 0.0000 ug/min | INTRAVENOUS | Status: DC

## 2012-09-01 MED ORDER — THROMBIN 20000 UNITS EX SOLR
OROMUCOSAL | Status: DC | PRN
  Administered 2012-09-01: 09:00:00 via TOPICAL

## 2012-09-01 MED ORDER — INSULIN REGULAR HUMAN 100 UNIT/ML IJ SOLN
INTRAMUSCULAR | Status: DC
  Administered 2012-09-02: 0.9 [IU]/h via INTRAVENOUS
  Filled 2012-09-01 (×2): qty 1

## 2012-09-01 MED ORDER — BUDESONIDE-FORMOTEROL FUMARATE 160-4.5 MCG/ACT IN AERO
2.0000 | INHALATION_SPRAY | Freq: Two times a day (BID) | RESPIRATORY_TRACT | Status: DC
  Administered 2012-09-01 – 2012-09-05 (×8): 2 via RESPIRATORY_TRACT
  Filled 2012-09-01: qty 6

## 2012-09-01 MED ORDER — PROTAMINE SULFATE 10 MG/ML IV SOLN
INTRAVENOUS | Status: DC | PRN
  Administered 2012-09-01: 170 mg via INTRAVENOUS

## 2012-09-01 MED ORDER — ACETAMINOPHEN 10 MG/ML IV SOLN
1000.0000 mg | Freq: Once | INTRAVENOUS | Status: AC
  Administered 2012-09-01: 1000 mg via INTRAVENOUS
  Filled 2012-09-01: qty 100

## 2012-09-01 MED ORDER — VENLAFAXINE HCL 75 MG PO TABS
150.0000 mg | ORAL_TABLET | Freq: Every day | ORAL | Status: DC
  Administered 2012-09-02 – 2012-09-06 (×5): 150 mg via ORAL
  Filled 2012-09-01 (×5): qty 2

## 2012-09-01 MED ORDER — ALBUMIN HUMAN 5 % IV SOLN
INTRAVENOUS | Status: DC | PRN
  Administered 2012-09-01: 14:00:00 via INTRAVENOUS

## 2012-09-01 MED ORDER — DOCUSATE SODIUM 100 MG PO CAPS
200.0000 mg | ORAL_CAPSULE | Freq: Every day | ORAL | Status: DC
  Administered 2012-09-02 – 2012-09-03 (×2): 200 mg via ORAL
  Filled 2012-09-01 (×2): qty 2

## 2012-09-01 MED ORDER — ACETAMINOPHEN 160 MG/5ML PO SOLN
975.0000 mg | Freq: Four times a day (QID) | ORAL | Status: DC
  Filled 2012-09-01: qty 40.6

## 2012-09-01 MED ORDER — HEPARIN SODIUM (PORCINE) 1000 UNIT/ML IJ SOLN
INTRAMUSCULAR | Status: DC | PRN
  Administered 2012-09-01: 35000 [IU] via INTRAVENOUS

## 2012-09-01 MED ORDER — INSULIN REGULAR BOLUS VIA INFUSION
0.0000 [IU] | Freq: Three times a day (TID) | INTRAVENOUS | Status: DC
  Administered 2012-09-02: 2.5 [IU] via INTRAVENOUS
  Administered 2012-09-02: 1.4 [IU] via INTRAVENOUS
  Filled 2012-09-01: qty 10

## 2012-09-01 MED ORDER — VENLAFAXINE HCL 75 MG PO TABS
75.0000 mg | ORAL_TABLET | Freq: Every day | ORAL | Status: DC
  Administered 2012-09-02 – 2012-09-05 (×4): 75 mg via ORAL
  Filled 2012-09-01 (×5): qty 1

## 2012-09-01 MED ORDER — OXYCODONE HCL 5 MG PO TABS
5.0000 mg | ORAL_TABLET | ORAL | Status: DC | PRN
  Administered 2012-09-02: 10 mg via ORAL
  Administered 2012-09-03: 5 mg via ORAL
  Filled 2012-09-01: qty 1
  Filled 2012-09-01: qty 2

## 2012-09-01 MED ORDER — METOPROLOL TARTRATE 25 MG/10 ML ORAL SUSPENSION
12.5000 mg | Freq: Two times a day (BID) | ORAL | Status: DC
  Filled 2012-09-01 (×3): qty 5

## 2012-09-01 MED ORDER — CHLORHEXIDINE GLUCONATE 4 % EX LIQD: 30.0000 mL | CUTANEOUS | Status: DC

## 2012-09-01 MED ORDER — SODIUM CHLORIDE 0.9 % IV SOLN
INTRAVENOUS | Status: DC
  Administered 2012-09-01 – 2012-09-02 (×2): via INTRAVENOUS

## 2012-09-01 MED ORDER — SODIUM CHLORIDE 0.9 % IV SOLN
10.0000 g | INTRAVENOUS | Status: DC | PRN
  Administered 2012-09-01: 5 g/h via INTRAVENOUS

## 2012-09-01 MED ORDER — METOPROLOL TARTRATE 12.5 MG HALF TABLET
12.5000 mg | ORAL_TABLET | Freq: Two times a day (BID) | ORAL | Status: DC
  Filled 2012-09-01 (×3): qty 1

## 2012-09-01 MED ORDER — VECURONIUM BROMIDE 10 MG IV SOLR
INTRAVENOUS | Status: DC | PRN
  Administered 2012-09-01: 2 mg via INTRAVENOUS
  Administered 2012-09-01: 3 mg via INTRAVENOUS

## 2012-09-01 MED ORDER — HEMOSTATIC AGENTS (NO CHARGE) OPTIME
TOPICAL | Status: DC | PRN
  Administered 2012-09-01: 1 via TOPICAL

## 2012-09-01 MED ORDER — MIDAZOLAM HCL 5 MG/5ML IJ SOLN
INTRAMUSCULAR | Status: DC | PRN
  Administered 2012-09-01 (×4): 2 mg via INTRAVENOUS

## 2012-09-01 MED ORDER — SODIUM CHLORIDE 0.9 % IV SOLN
20.0000 mg | INTRAVENOUS | Status: DC | PRN
  Administered 2012-09-01: 10 ug/min via INTRAVENOUS

## 2012-09-01 MED ORDER — ASPIRIN 81 MG PO CHEW: 324.0000 mg | CHEWABLE_TABLET | Freq: Every day | ORAL | Status: DC

## 2012-09-01 MED ORDER — MIDAZOLAM HCL 2 MG/2ML IJ SOLN
2.0000 mg | INTRAMUSCULAR | Status: DC | PRN
  Administered 2012-09-01: 1 mg via INTRAVENOUS
  Filled 2012-09-01: qty 2

## 2012-09-01 MED ORDER — SODIUM CHLORIDE 0.45 % IV SOLN: INTRAVENOUS | Status: DC

## 2012-09-01 MED ORDER — ONDANSETRON HCL 4 MG/2ML IJ SOLN: 4.0000 mg | Freq: Four times a day (QID) | INTRAMUSCULAR | Status: DC | PRN

## 2012-09-01 MED ORDER — ASPIRIN EC 325 MG PO TBEC
325.0000 mg | DELAYED_RELEASE_TABLET | Freq: Every day | ORAL | Status: DC
  Administered 2012-09-02 – 2012-09-03 (×2): 325 mg via ORAL
  Filled 2012-09-01 (×2): qty 1

## 2012-09-01 MED ORDER — ACETAMINOPHEN 500 MG PO TABS
1000.0000 mg | ORAL_TABLET | Freq: Four times a day (QID) | ORAL | Status: DC
  Administered 2012-09-01 – 2012-09-03 (×7): 1000 mg via ORAL
  Filled 2012-09-01 (×9): qty 2

## 2012-09-01 MED ORDER — VENLAFAXINE HCL 75 MG PO TABS
75.0000 mg | ORAL_TABLET | Freq: Every day | ORAL | Status: DC
  Filled 2012-09-01: qty 1

## 2012-09-01 MED ORDER — DOPAMINE-DEXTROSE 3.2-5 MG/ML-% IV SOLN: 0.0000 ug/kg/min | INTRAVENOUS | Status: DC

## 2012-09-01 MED ORDER — DEXMEDETOMIDINE HCL IN NACL 200 MCG/50ML IV SOLN
0.1000 ug/kg/h | INTRAVENOUS | Status: DC
  Administered 2012-09-01: 0.4 ug/kg/h via INTRAVENOUS

## 2012-09-01 MED ORDER — MAGNESIUM SULFATE 40 MG/ML IJ SOLN
4.0000 g | Freq: Once | INTRAMUSCULAR | Status: AC
  Administered 2012-09-01: 4 g via INTRAVENOUS
  Filled 2012-09-01: qty 100

## 2012-09-01 MED ORDER — ALBUMIN HUMAN 5 % IV SOLN
250.0000 mL | INTRAVENOUS | Status: AC | PRN
  Administered 2012-09-01 (×2): 250 mL via INTRAVENOUS

## 2012-09-01 MED ORDER — THROMBIN 20000 UNITS EX KIT
PACK | CUTANEOUS | Status: DC | PRN
  Administered 2012-09-01: 20000 [IU] via TOPICAL

## 2012-09-01 MED ORDER — 0.9 % SODIUM CHLORIDE (POUR BTL) OPTIME
TOPICAL | Status: DC | PRN
  Administered 2012-09-01: 1000 mL

## 2012-09-01 MED ORDER — SODIUM CHLORIDE 0.9 % IV SOLN
100.0000 [IU] | INTRAVENOUS | Status: DC | PRN
  Administered 2012-09-01: 1 [IU]/h via INTRAVENOUS

## 2012-09-01 MED ORDER — SODIUM CHLORIDE 0.9 % IJ SOLN
3.0000 mL | Freq: Two times a day (BID) | INTRAMUSCULAR | Status: DC
  Administered 2012-09-02 – 2012-09-03 (×3): 3 mL via INTRAVENOUS

## 2012-09-01 MED ORDER — DEXMEDETOMIDINE HCL IN NACL 200 MCG/50ML IV SOLN
INTRAVENOUS | Status: DC | PRN
  Administered 2012-09-01: 0.2 ug/kg/h via INTRAVENOUS

## 2012-09-01 MED ORDER — PHENYLEPHRINE HCL 10 MG/ML IJ SOLN
0.0000 ug/min | INTRAVENOUS | Status: DC
  Filled 2012-09-01 (×2): qty 2

## 2012-09-01 MED ORDER — NITROGLYCERIN IN D5W 200-5 MCG/ML-% IV SOLN
INTRAVENOUS | Status: DC | PRN
  Administered 2012-09-01: 5 ug/min via INTRAVENOUS

## 2012-09-01 MED ORDER — PANTOPRAZOLE SODIUM 40 MG PO TBEC
40.0000 mg | DELAYED_RELEASE_TABLET | Freq: Every day | ORAL | Status: DC
  Administered 2012-09-03: 40 mg via ORAL
  Filled 2012-09-01: qty 1

## 2012-09-01 MED ORDER — LACTATED RINGERS IV SOLN: INTRAVENOUS | Status: DC

## 2012-09-01 MED ORDER — BISACODYL 5 MG PO TBEC
10.0000 mg | DELAYED_RELEASE_TABLET | Freq: Every day | ORAL | Status: DC
  Administered 2012-09-02: 10 mg via ORAL
  Filled 2012-09-01: qty 2

## 2012-09-01 MED ORDER — ROCURONIUM BROMIDE 100 MG/10ML IV SOLN
INTRAVENOUS | Status: DC | PRN
  Administered 2012-09-01: 60 mg via INTRAVENOUS
  Administered 2012-09-01: 50 mg via INTRAVENOUS
  Administered 2012-09-01: 40 mg via INTRAVENOUS

## 2012-09-01 MED ORDER — LACTATED RINGERS IV SOLN
INTRAVENOUS | Status: DC | PRN
  Administered 2012-09-01 (×3): via INTRAVENOUS

## 2012-09-01 MED ORDER — VANCOMYCIN HCL 1000 MG IV SOLR
1000.0000 mg | INTRAVENOUS | Status: DC | PRN
  Administered 2012-09-01: 1250 mg via INTRAVENOUS

## 2012-09-01 MED ORDER — THROMBIN 20000 UNITS EX SOLR
CUTANEOUS | Status: AC
  Filled 2012-09-01: qty 20000

## 2012-09-01 MED ORDER — MORPHINE SULFATE 2 MG/ML IJ SOLN
1.0000 mg | INTRAMUSCULAR | Status: AC | PRN
  Administered 2012-09-01: 4 mg via INTRAVENOUS

## 2012-09-01 MED ORDER — TAMSULOSIN HCL 0.4 MG PO CAPS
0.4000 mg | ORAL_CAPSULE | Freq: Every day | ORAL | Status: DC
Start: 2012-09-02 — End: 2012-09-06
  Administered 2012-09-02 – 2012-09-05 (×4): 0.4 mg via ORAL
  Filled 2012-09-01 (×6): qty 1

## 2012-09-01 MED ORDER — SIMVASTATIN 10 MG PO TABS
10.0000 mg | ORAL_TABLET | Freq: Every day | ORAL | Status: DC
  Administered 2012-09-02 – 2012-09-05 (×4): 10 mg via ORAL
  Filled 2012-09-01 (×6): qty 1

## 2012-09-01 MED ORDER — FAMOTIDINE IN NACL 20-0.9 MG/50ML-% IV SOLN
20.0000 mg | Freq: Two times a day (BID) | INTRAVENOUS | Status: AC
  Administered 2012-09-01: 20 mg via INTRAVENOUS

## 2012-09-01 MED ORDER — DOPAMINE-DEXTROSE 3.2-5 MG/ML-% IV SOLN
INTRAVENOUS | Status: DC | PRN
  Administered 2012-09-01: 3 ug/kg/min via INTRAVENOUS

## 2012-09-01 MED ORDER — DEXTROSE 5 % IV SOLN
1.5000 g | Freq: Two times a day (BID) | INTRAVENOUS | Status: AC
  Administered 2012-09-01 – 2012-09-03 (×4): 1.5 g via INTRAVENOUS
  Filled 2012-09-01 (×4): qty 1.5

## 2012-09-01 MED ORDER — LACTATED RINGERS IV SOLN: 500.0000 mL | Freq: Once | INTRAVENOUS | Status: AC | PRN

## 2012-09-01 MED ORDER — METOCLOPRAMIDE HCL 5 MG/ML IJ SOLN: 10.0000 mg | Freq: Four times a day (QID) | INTRAMUSCULAR | Status: DC

## 2012-09-01 SURGICAL SUPPLY — 121 items
ADAPTER CARDIO PERF ANTE/RETRO (ADAPTER) ×3 IMPLANT
ADPR PRFSN 84XANTGRD RTRGD (ADAPTER) ×2
APL SKNCLS STERI-STRIP NONHPOA (GAUZE/BANDAGES/DRESSINGS) ×2
ATTRACTOMAT 16X20 MAGNETIC DRP (DRAPES) ×3 IMPLANT
BAG DECANTER FOR FLEXI CONT (MISCELLANEOUS) ×3 IMPLANT
BANDAGE ELASTIC 4 VELCRO ST LF (GAUZE/BANDAGES/DRESSINGS) ×3 IMPLANT
BANDAGE ELASTIC 6 VELCRO ST LF (GAUZE/BANDAGES/DRESSINGS) ×3 IMPLANT
BANDAGE GAUZE ELAST BULKY 4 IN (GAUZE/BANDAGES/DRESSINGS) ×3 IMPLANT
BASKET HEART (ORDER IN 25'S) (MISCELLANEOUS)
BASKET HEART (ORDER IN 25S) (MISCELLANEOUS) ×1 IMPLANT
BENZOIN TINCTURE PRP APPL 2/3 (GAUZE/BANDAGES/DRESSINGS) ×1 IMPLANT
BLADE STERNUM SYSTEM 6 (BLADE) ×3 IMPLANT
BLADE SURG 15 STRL LF DISP TIS (BLADE) ×1 IMPLANT
BLADE SURG 15 STRL SS (BLADE)
CANISTER SUCTION 2500CC (MISCELLANEOUS) ×3 IMPLANT
CANNULA AORTIC HI-FLOW 6.5M20F (CANNULA) ×2 IMPLANT
CANNULA GUNDRY RCSP 15FR (MISCELLANEOUS) ×3 IMPLANT
CANNULA VENOUS LOW PROF 34X46 (CANNULA) ×1 IMPLANT
CATH HEART VENT LEFT (CATHETERS) ×1 IMPLANT
CATH ROBINSON RED A/P 18FR (CATHETERS) ×11 IMPLANT
CATH THORACIC 28FR (CATHETERS) ×1 IMPLANT
CATH THORACIC 28FR RT ANG (CATHETERS) IMPLANT
CATH THORACIC 36FR (CATHETERS) ×3 IMPLANT
CATH THORACIC 36FR RT ANG (CATHETERS) ×3 IMPLANT
CATH/SQUID NICHOLS JEHLE COR (CATHETERS) ×1 IMPLANT
CLIP TI MEDIUM 24 (CLIP) IMPLANT
CLIP TI WIDE RED SMALL 24 (CLIP) IMPLANT
CLOSURE STERI-STRIP 1/4X4 (GAUZE/BANDAGES/DRESSINGS) ×1 IMPLANT
CLOTH BEACON ORANGE TIMEOUT ST (SAFETY) ×3 IMPLANT
CONT SPEC STER OR (MISCELLANEOUS) ×3 IMPLANT
COVER SURGICAL LIGHT HANDLE (MISCELLANEOUS) ×6 IMPLANT
CRADLE DONUT ADULT HEAD (MISCELLANEOUS) ×3 IMPLANT
DRAPE CARDIOVASCULAR INCISE (DRAPES) ×3
DRAPE SLUSH MACHINE 52X66 (DRAPES) IMPLANT
DRAPE SLUSH/WARMER DISC (DRAPES) ×2 IMPLANT
DRAPE SRG 135X102X78XABS (DRAPES) ×2 IMPLANT
DRSG COVADERM 4X14 (GAUZE/BANDAGES/DRESSINGS) ×3 IMPLANT
ELECT CAUTERY BLADE 6.4 (BLADE) ×3 IMPLANT
ELECT REM PT RETURN 9FT ADLT (ELECTROSURGICAL) ×6
ELECTRODE REM PT RTRN 9FT ADLT (ELECTROSURGICAL) ×4 IMPLANT
GLOVE BIO SURGEON STRL SZ 6 (GLOVE) ×4 IMPLANT
GLOVE BIO SURGEON STRL SZ 6.5 (GLOVE) ×2 IMPLANT
GLOVE BIO SURGEON STRL SZ7 (GLOVE) IMPLANT
GLOVE BIO SURGEON STRL SZ7.5 (GLOVE) IMPLANT
GLOVE BIOGEL PI IND STRL 6 (GLOVE) ×2 IMPLANT
GLOVE BIOGEL PI IND STRL 6.5 (GLOVE) IMPLANT
GLOVE BIOGEL PI IND STRL 7.0 (GLOVE) ×2 IMPLANT
GLOVE BIOGEL PI INDICATOR 6 (GLOVE) ×2
GLOVE BIOGEL PI INDICATOR 6.5 (GLOVE) ×5
GLOVE BIOGEL PI INDICATOR 7.0 (GLOVE) ×2
GLOVE EUDERMIC 7 POWDERFREE (GLOVE) ×8 IMPLANT
GLOVE ORTHO TXT STRL SZ7.5 (GLOVE) IMPLANT
GOWN PREVENTION PLUS XLARGE (GOWN DISPOSABLE) ×3 IMPLANT
GOWN STRL NON-REIN LRG LVL3 (GOWN DISPOSABLE) ×16 IMPLANT
HEART VENT LT CURVED (MISCELLANEOUS) ×3 IMPLANT
HEMOSTAT POWDER SURGIFOAM 1G (HEMOSTASIS) ×10 IMPLANT
HEMOSTAT SURGICEL 2X14 (HEMOSTASIS) ×3 IMPLANT
INSERT FOGARTY 61MM (MISCELLANEOUS) IMPLANT
INSERT FOGARTY XLG (MISCELLANEOUS) IMPLANT
KIT BASIN OR (CUSTOM PROCEDURE TRAY) ×3 IMPLANT
KIT CATH CPB BARTLE (MISCELLANEOUS) ×2 IMPLANT
KIT ROOM TURNOVER OR (KITS) ×3 IMPLANT
KIT SUCTION CATH 14FR (SUCTIONS) ×3 IMPLANT
KIT VASOVIEW W/TROCAR VH 2000 (KITS) ×1 IMPLANT
NS IRRIG 1000ML POUR BTL (IV SOLUTION) ×19 IMPLANT
PACK OPEN HEART (CUSTOM PROCEDURE TRAY) ×3 IMPLANT
PAD ARMBOARD 7.5X6 YLW CONV (MISCELLANEOUS) ×6 IMPLANT
PENCIL BUTTON HOLSTER BLD 10FT (ELECTRODE) ×3 IMPLANT
PUNCH AORTIC ROTATE 4.0MM (MISCELLANEOUS) IMPLANT
PUNCH AORTIC ROTATE 4.5MM 8IN (MISCELLANEOUS) ×2 IMPLANT
PUNCH AORTIC ROTATE 5MM 8IN (MISCELLANEOUS) IMPLANT
SPONGE GAUZE 4X4 12PLY (GAUZE/BANDAGES/DRESSINGS) ×5 IMPLANT
SPONGE INTESTINAL PEANUT (DISPOSABLE) IMPLANT
SPONGE LAP 18X18 X RAY DECT (DISPOSABLE) IMPLANT
SPONGE LAP 4X18 X RAY DECT (DISPOSABLE) ×3 IMPLANT
SUT BONE WAX W31G (SUTURE) ×3 IMPLANT
SUT ETHIBON 2 0 V 52N 30 (SUTURE) ×8 IMPLANT
SUT ETHIBON EXCEL 2-0 V-5 (SUTURE) ×2 IMPLANT
SUT ETHIBOND 2 0 SH (SUTURE) ×6
SUT ETHIBOND 2 0 SH 36X2 (SUTURE) IMPLANT
SUT MNCRL AB 4-0 PS2 18 (SUTURE) IMPLANT
SUT PROLENE 2 0 SH DA (SUTURE) ×2 IMPLANT
SUT PROLENE 3 0 SH 1 (SUTURE) ×3 IMPLANT
SUT PROLENE 3 0 SH DA (SUTURE) IMPLANT
SUT PROLENE 3 0 SH1 36 (SUTURE) ×3 IMPLANT
SUT PROLENE 4 0 RB 1 (SUTURE) ×12
SUT PROLENE 4 0 SH DA (SUTURE) IMPLANT
SUT PROLENE 4-0 RB1 .5 CRCL 36 (SUTURE) ×7 IMPLANT
SUT PROLENE 5 0 C 1 36 (SUTURE) IMPLANT
SUT PROLENE 6 0 C 1 30 (SUTURE) IMPLANT
SUT PROLENE 7 0 BV 1 (SUTURE) IMPLANT
SUT PROLENE 7 0 BV1 MDA (SUTURE) IMPLANT
SUT PROLENE 8 0 BV175 6 (SUTURE) IMPLANT
SUT SILK  1 MH (SUTURE)
SUT SILK 1 MH (SUTURE) IMPLANT
SUT SILK 2 0 SH CR/8 (SUTURE) ×1 IMPLANT
SUT STEEL 6MS V (SUTURE) IMPLANT
SUT STEEL STERNAL CCS#1 18IN (SUTURE) IMPLANT
SUT STEEL SZ 6 DBL 3X14 BALL (SUTURE) ×3 IMPLANT
SUT VIC AB 1 CTX 36 (SUTURE) ×9
SUT VIC AB 1 CTX36XBRD ANBCTR (SUTURE) ×4 IMPLANT
SUT VIC AB 2-0 CT1 27 (SUTURE)
SUT VIC AB 2-0 CT1 TAPERPNT 27 (SUTURE) IMPLANT
SUT VIC AB 2-0 CTX 27 (SUTURE) IMPLANT
SUT VIC AB 3-0 SH 27 (SUTURE)
SUT VIC AB 3-0 SH 27X BRD (SUTURE) IMPLANT
SUT VIC AB 3-0 X1 27 (SUTURE) IMPLANT
SUT VICRYL 4-0 PS2 18IN ABS (SUTURE) IMPLANT
SUTURE E-PAK OPEN HEART (SUTURE) ×3 IMPLANT
SYSTEM SAHARA CHEST DRAIN ATS (WOUND CARE) ×3 IMPLANT
TAPE CLOTH SURG 4X10 WHT LF (GAUZE/BANDAGES/DRESSINGS) ×1 IMPLANT
TAPE PAPER 2X10 WHT MICROPORE (GAUZE/BANDAGES/DRESSINGS) ×2 IMPLANT
TOWEL OR 17X24 6PK STRL BLUE (TOWEL DISPOSABLE) ×6 IMPLANT
TOWEL OR 17X26 10 PK STRL BLUE (TOWEL DISPOSABLE) ×6 IMPLANT
TRAY FOLEY IC TEMP SENS 14FR (CATHETERS) ×3 IMPLANT
TUBE SUCT INTRACARD DLP 20F (MISCELLANEOUS) ×3 IMPLANT
TUBING INSUFFLATION 10FT LAP (TUBING) ×3 IMPLANT
UNDERPAD 30X30 INCONTINENT (UNDERPADS AND DIAPERS) ×3 IMPLANT
VALVE MAGNA EASE 21MM (Prosthesis & Implant Heart) ×2 IMPLANT
VENT LEFT HEART 12002 (CATHETERS) ×3
WATER STERILE IRR 1000ML POUR (IV SOLUTION) ×6 IMPLANT

## 2012-09-01 NOTE — Progress Notes (Signed)
Patient ID: MACKEY VARRICCHIO, male   DOB: 15-Feb-1935, 78 y.o.   MRN: [id number]                   [address]            [place] [id number]          [phone]     Day of Surgery Procedure(s) (LRB): AORTIC VALVE REPLACEMENT (AVR) (N/A) INTRAOPERATIVE TRANSESOPHAGEAL ECHOCARDIOGRAM (N/A)  Total Length of Stay:  LOS: 0 days  BP 108/46  Pulse 80  Temp(Src) 97.7 F (36.5 C) (Oral)  Resp 19  Ht 5\' 4"  (1.626 m)  Wt 195 lb (88.451 kg)  BMI 33.46 kg/m2  SpO2 97%  .Intake/Output     03/12 0701 - 03/13 0700 03/13 0701 - 03/14 0700   I.V. (mL/kg)  4016.1 (45.4)   Blood  320   IV Piggyback  1000   Total Intake(mL/kg)  5336.1 (60.3)   Urine (mL/kg/hr)  1780 (1.9)   Blood  600 (0.6)   Chest Tube  30 (0)   Total Output   2410   Net   +2926.1          . sodium chloride Stopped (09/01/12 1445)  . sodium chloride 20 mL/hr at 09/01/12 1727  . [START ON 09/02/2012] sodium chloride    . dexmedetomidine Stopped (09/01/12 1615)  . DOPamine 3 mcg/kg/min (09/01/12 1700)  . insulin (NOVOLIN-R) infusion 2.4 Units/hr (09/01/12 1700)  . lactated ringers 60 mL/hr at 09/01/12 1600  . nitroGLYCERIN    . phenylephrine (NEO-SYNEPHRINE) Adult infusion 50 mcg/min (09/01/12 1700)     Lab Results  Component Value Date   WBC 17.9* 09/01/2012   HGB 10.5* 09/01/2012   HCT 31.0* 09/01/2012   PLT 218 09/01/2012   GLUCOSE 148* 09/01/2012   ALT 15 08/31/2012   AST 17 08/31/2012   NA 140 09/01/2012   K 4.1 09/01/2012   CL 104 08/31/2012   CREATININE 0.81 08/31/2012   BUN 18 08/31/2012   CO2 22 08/31/2012   TSH 1.33 09/30/2010   INR 1.24 09/01/2012   HGBA1C 6.2* 08/31/2012   Early postop, still on vent. No bleeding from CT, has hematuria which is clearing Stable hemodynamics  Delight Ovens MD  Beeper [phone] Office [phone] 09/01/2012 5:51 PM

## 2012-09-01 NOTE — Anesthesia Preprocedure Evaluation (Addendum)
Anesthesia Evaluation  Patient identified by MRN, date of birth, ID band Patient awake    Reviewed: Allergy & Precautions, H&P , NPO status   Airway Mallampati: III  Neck ROM: Full    Dental  (+) Edentulous Upper and Edentulous Lower   Pulmonary shortness of breath, COPD breath sounds clear to auscultation        Cardiovascular hypertension, + CAD + dysrhythmias Atrial Fibrillation + Valvular Problems/Murmurs AS Rhythm:Regular Rate:Normal + Systolic murmurs    Neuro/Psych Anxiety Depression    GI/Hepatic negative GI ROS, Neg liver ROS,   Endo/Other  diabetes, Well Controlled, Insulin Dependent  Renal/GU Renal disease     Musculoskeletal   Abdominal   Peds  Hematology   Anesthesia Other Findings   Reproductive/Obstetrics                         Anesthesia Physical Anesthesia Plan  ASA: III  Anesthesia Plan: General   Post-op Pain Management:    Induction: Intravenous  Airway Management Planned: Oral ETT  Additional Equipment: Arterial line, PA Cath and TEE  Intra-op Plan:   Post-operative Plan:   Informed Consent: I have reviewed the patients History and Physical, chart, labs and discussed the procedure including the risks, benefits and alternatives for the proposed anesthesia with the patient or authorized representative who has indicated his/her understanding and acceptance.   Dental advisory given  Plan Discussed with: CRNA and Surgeon  Anesthesia Plan Comments:         Anesthesia Quick Evaluation

## 2012-09-01 NOTE — Transfer of Care (Signed)
Immediate Anesthesia Transfer of Care Note  Patient: Adam [name]  Procedure(s) Performed: Procedure(s): AORTIC VALVE REPLACEMENT (AVR) (N/A) INTRAOPERATIVE TRANSESOPHAGEAL ECHOCARDIOGRAM (N/A)  Patient Location: SICU  Anesthesia Type:General  Level of Consciousness: sedated  Airway & Oxygen Therapy: Patient remains intubated per anesthesia plan  Post-op Assessment: Report given to PACU RN and Post -op Vital signs reviewed and stable  Post vital signs: Reviewed and stable  Complications: No apparent anesthesia complications

## 2012-09-01 NOTE — Interval H&P Note (Signed)
History and Physical Interval Note:  09/01/2012 7:50 AM  Adam [name]  has presented today for surgery, with the diagnosis of Severe AS  The various methods of treatment have been discussed with the patient and family. After consideration of risks, benefits and other options for treatment, the patient has consented to  Procedure(s): AORTIC VALVE REPLACEMENT (AVR) (N/A) CORONARY ARTERY BYPASS GRAFTING (CABG) (N/A) INTRAOPERATIVE TRANSESOPHAGEAL ECHOCARDIOGRAM (N/A) as a surgical intervention .  The patient's history has been reviewed, patient examined, no change in status, stable for surgery.  I have reviewed the patient's chart and labs.  Questions were answered to the patient's satisfaction.     Alleen Borne

## 2012-09-01 NOTE — Anesthesia Procedure Notes (Addendum)
Procedure Name: Intubation Date/Time: 09/01/2012 8:11 AM Performed by: Sharlene Dory E Pre-anesthesia Checklist: Patient identified, Emergency Drugs available, Suction available, Patient being monitored and Timeout performed Patient Re-evaluated:Patient Re-evaluated prior to inductionOxygen Delivery Method: Circle system utilized Preoxygenation: Pre-oxygenation with 100% oxygen Intubation Type: IV induction Ventilation: Mask ventilation without difficulty and Oral airway inserted - appropriate to patient size Laryngoscope Size: Mac and 3 Grade View: Grade II Tube type: Oral Tube size: 8.0 mm Number of attempts: 1 Airway Equipment and Method: Stylet Placement Confirmation: ETT inserted through vocal cords under direct vision,  positive ETCO2 and breath sounds checked- equal and bilateral Secured at: 22 cm Tube secured with: Tape Dental Injury: Teeth and Oropharynx as per pre-operative assessment     Procedures: Right IJ Theone Murdoch Catheter Insertion: [id number]: The patient was identified and consent obtained.  TO was performed, and full barrier precautions were used.  The skin was anesthetized with lidocaine-4cc plain with 25g needle.  Once the vein was located with the 22 ga. needle using ultrasound guidance , the wire was inserted into the vein.  The wire location was confirmed with ultrasound.  The tissue was dilated and the 8.5 [place] cordis catheter was carefully inserted. Afterwards Theone Murdoch catheter was inserted. PA catheter at 55cm.  The patient tolerated the procedure well.   CE

## 2012-09-01 NOTE — Procedures (Signed)
Extubation Procedure Note  Patient Details:   Name: Adam [name] DOB: 10-27-1934 MRN: [id number]   Airway Documentation:     Evaluation  O2 sats: stable throughout and currently acceptable Complications: No apparent complications Patient did tolerate procedure well. Bilateral Breath Sounds: Clear   Yes Pt awake and alert, extubated per protocol. PLaced on 3L [place], sat 96%. Positive cuff leak, NIF -20, VC 750, IS  250, BBS RH. Pt able to vocalize.  [name] 09/01/2012, 6:11 PM

## 2012-09-01 NOTE — H&P (Signed)
[address]       [place] [address]             [phone]      PCP is Garlan Fillers, MD  Referring Provider is Tonny Bollman, MD  Chief Complaint   Patient presents with   .  Aortic Stenosis     Surgical eval, Cardiac Cath 08/24/12, TEE 08/02/12   .  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 He had a left internal mammary graft to the LAD with a saphenous vein graft to the intermediate coronary, a sequential saphenous vein graft to the posterior descending branch of the right coronary and the posterior lateral branch of the left circumflex coronary, and a saphenous vein graft to the obtuse marginal coronary artery. He has done well until the past 6 months when he is noted slowly progressive shortness of breath. He does note it has gotten to the point where he can't do much activity without getting some shortness of breath. He has occasional episodes of chest pressure. A recent echocardiogram showed severe aortic stenosis with a peak velocity of 4.19 meters per second with a peak gradient of 70 mm mercury and a mean gradient of 41 mm mercury. His last echocardiogram prior to this was in October of 2013 and his peak velocity at that time was 4.42 m/s with a mean gradient of 36 mm mercury. Cardiac catheterization was performed recently and showed normal right and left heart pressures. The mean gradient across the aortic valve was measured at 22 with an aortic valve area of 0.9 cm . Coronary angiography showed the left circumflex to be totally occluded at its ostium. The right coronary was totally occluded proximally. The LAD was diffusely diseased proximally and totally occluded before the first septal perforator. The left internal mammary graft to the LAD was widely patent. The vein graft to the intermediate was patent. The vein  graft to the obtuse marginal was diffusely diseased with a 80% stenosis in the midportion with severe distal graft stenosis. The obtuse marginal vessel appeared relatively small with some distal disease in it. The saphenous vein graft to the posterior descending and posterior lateral branches was patent with some mild proximal irregularity most likely due to a valve. The saphenous vein graft to the acute marginal is widely patent. Left ventricular ejection fraction of 65-70% with no mitral regurgitation. Aortic root angiography showed the proximal aorta to be of normal size with some calcification of the mid descending aorta posteriorly as well as some calcified plaque in the aortic root. The aortic valve was heavily calcified with poor leaflet motion. Abdominal aortography showed 80% ostial stenosis of the right internal iliac artery. The right external iliac artery was severely diseased with 99% calcified eccentric stenosis. The left external iliac artery had severe diffuse 80% stenosis with a patent internal iliac artery on that side.  Past Medical History   Diagnosis  Date   .  Hypertension    .  Hyperlipidemia    .  Type II or unspecified type diabetes mellitus without mention of complication, not stated as uncontrolled    .  Nephrolithiasis    .  Atrial flutter      s/p CTI ablation by Dr Ladona Ridgel 1/12   .  CAD (coronary artery disease)  s/p CABG 2003 by Dr Laneta Simmers   .  Aortic stenosis, moderate      moderate to severe per echo 03/2012   .  DJD (degenerative joint disease)    .  DDD (degenerative disc disease)    .  Spinal stenosis of lumbar region    .  Bifascicular block    .  Atrial tachycardia    .  S/P CABG x 5  08/19/2012     CABGx5 by Dr Laneta Simmers: LIMA to LAD, SVG to OM, sequential SVG to PDA and LPLB, SVG to AM, EVH and open vein harvest from right thigh and leg    Past Surgical History   Procedure  Laterality  Date   .  Cabg   2003     by Dr Laneta Simmers   .  Anal fissure repair       .  Leg surgery       right   .  Trigger finger release       bilat.   .  Tonsillectomy     .  Back surgery   2010   .  Atrial ablation surgery   1/12     atrial flutter ablation by Dr Ladona Ridgel   .  Cardioversion   04/07/2012     Procedure: CARDIOVERSION; Surgeon: Wendall Stade, MD; Location: [facility] ENDOSCOPY; Service: Cardiovascular; Laterality: N/A;    Family History   Problem  Relation  Age of Onset   .  Heart disease  Father    .  Heart disease  Paternal Grandfather    .  Stroke  Mother    .  Rheum arthritis  Mother    Social History  History   Substance Use Topics   .  Smoking status:  Former Smoker -- 1.00 packs/day for 49 years     Quit date:  08/21/2001   .  Smokeless tobacco:  Not on file      Comment: started at age 17.   [name]  Alcohol Use:  No    Current Outpatient Prescriptions   Medication  Sig  Dispense  Refill   .  albuterol (PROVENTIL HFA;VENTOLIN HFA) 108 (90 BASE) MCG/ACT inhaler  Inhale 2 puffs into the lungs every 6 (six) hours as needed for wheezing.  3 Inhaler  4   .  budesonide-formoterol (SYMBICORT) 160-4.5 MCG/ACT inhaler  Inhale 2 puffs into the lungs 2 (two) times daily.     .  carisoprodol (SOMA) 350 MG tablet  Take 350 mg by mouth 4 (four) times daily as needed.     .  cilostazol (PLETAL) 50 MG tablet  Take 1 tablet by mouth Twice daily.     [name]  HUMALOG 100 UNIT/ML injection  Inject 34 Units into the skin 2 (two) times daily.     [name]  HUMULIN N 100 UNIT/ML injection  Inject 24 Units into the skin 3 (three) times daily.     .  hydrochlorothiazide (HYDRODIURIL) 25 MG tablet  Take 0.5 tablets (12.5 mg total) by mouth daily.  45 tablet  3   .  HYDROcodone-acetaminophen (NORCO) 5-325 MG per tablet  Take 1 tablet by mouth as needed.     [name]  LORazepam (ATIVAN) 1 MG tablet  Take 1-2 mg by mouth at bedtime as needed.     [name]  losartan (COZAAR) 100 MG tablet  Take 1 tablet (100 mg total) by mouth daily.  90 tablet  3   .  metFORMIN (GLUCOPHAGE-XR) 500  MG 24 hr tablet  Take  1,000 mg by mouth at bedtime.     .  metoprolol (LOPRESSOR) 100 MG tablet  Take 1 tablet (100 mg total) by mouth 2 (two) times daily.  90 tablet  3   .  potassium chloride (K-DUR,KLOR-CON) 10 MEQ tablet  Take 1 tablet (10 mEq total) by mouth daily.  90 tablet  3   .  simvastatin (ZOCOR) 10 MG tablet  Take 1 tablet (10 mg total) by mouth at bedtime.  90 tablet  3   .  Tamsulosin HCl (FLOMAX) 0.4 MG CAPS  Take 0.4 mg by mouth at bedtime.     [name]  venlafaxine (EFFEXOR) 75 MG tablet  Take 150mg  in the morning and 75mg  at bedtime.     [name]  zolpidem (AMBIEN) 5 MG tablet  Take 5-10 mg by mouth at bedtime as needed.     .  warfarin (COUMADIN) 5 MG tablet  Take 1 tablet (5 mg total) by mouth as directed.  180 tablet  1    No current facility-administered medications for this visit.    Allergies   Allergen  Reactions   .  Doxycycline      REACTION: swollen tongue   Review of Systems  Constitutional: Positive for activity change, fatigue and unexpected weight change. Negative for fever, chills, diaphoresis and appetite change.  HENT:  Wears dentures  Eyes:  Wears glasses  Respiratory: Positive for shortness of breath.  Cardiovascular: Positive for chest pain. Negative for palpitations and leg swelling.  Gastrointestinal: Negative.  Endocrine: Negative.  Genitourinary: Positive for frequency.  Allergic/Immunologic: Negative.  Neurological: Positive for headaches.  Hematological: Negative.  Psychiatric/Behavioral: Negative.  BP 155/77  Pulse 88  Resp 20  Ht 5\' 4"  (1.626 m)  Wt 195 lb (88.451 kg)  BMI 33.46 kg/m2  SpO2 91%  Physical Exam  Constitutional: He appears well-developed and well-nourished. No distress.  HENT:  Head: Normocephalic and atraumatic.  Mouth/Throat: Oropharynx is clear and moist.  Eyes: EOM are normal. Pupils are equal, round, and reactive to light.  Neck: Normal range of motion. Neck supple. No JVD present. No thyromegaly present.  Cardiovascular: Normal rate and  regular rhythm.  3/6 systolic murmur over aorta radiating into neck  Pulmonary/Chest: Effort normal. He has rales.  bibasilar  Abdominal: He exhibits no distension and no mass. There is no tenderness.  Musculoskeletal: He exhibits no edema.  Lymphadenopathy:  He has no cervical adenopathy.  Neurological: He has normal strength. No cranial nerve deficit or sensory deficit.  Skin: Skin is warm and dry.  Psychiatric: He has a normal mood and affect.  Diagnostic Tests:  [facility] [address] [place], [place] [address] [phone]  ------------------------------------------------------------ Transthoracic Echocardiography  Patient: Nels, Munn MR #: [id number] Study Date: 08/02/2012 Gender: M Age: 58 Height: 165.1cm Weight: 83.9kg BSA: 1.77m^2 Pt. Status: Room:  ATTENDING Daiva Huge PERFORMING [facility] ORDERING Tyrone Sage, Lori REFERRING Norma Fredrickson SONOGRAPHER Junious Dresser, RDCS cc:  ------------------------------------------------------------ LV EF: 60% - 65%  ------------------------------------------------------------ Indications: 424.1 Aortic valve disorders.  ------------------------------------------------------------ History: PMH: Acquired from the patient and from the patient's chart. Harsh outflow Murmur. Atrial flutter. Coronary artery disease. Borderline significant aortic stenosis. Risk factors: Former tobacco use. Hypertension. Diabetes mellitus. Obese. Dyslipidemia.  ------------------------------------------------------------ Study Conclusions  - Left ventricle: The cavity size was normal. Wall thickness was increased in a pattern of moderate LVH. Systolic function was normal. The estimated ejection fraction  was in the range of 60% to 65%. Wall motion was normal; there were no regional wall motion abnormalities. Doppler parameters are consistent with abnormal left  ventricular relaxation (grade 1 diastolic dysfunction). - Aortic valve: There was severe stenosis. Mean gradient: 41mm Hg (S). Peak gradient: 70mm Hg (S). - Mitral valve: Calcified annulus. Moderately thickened leaflets . The findings are consistent with trivial stenosis. Mean gradient: 4mm Hg (D). Peak gradient: 10mm Hg (D). Valve area by pressure half-time: 2.1cm^2. - Left atrium: The atrium was mildly dilated.  ------------------------------------------------------------ Labs, prior tests, procedures, and surgery: Echocardiography (October 2013). The aortic valve showed moderate to severe stenosis. EF was 55%. Aortic valve: peak gradient of 78mm Hg and mean gradient of 36mm Hg.  Electrophysiology study with ablation (2012). Atrial flutter ablation. Coronary artery bypass grafting. Transthoracic echocardiography. M-mode, complete 2D, spectral Doppler, and color Doppler. Height: Height: 165.1cm. Height: 65in. Weight: Weight: 83.9kg. Weight: 184.6lb. Body mass index: BMI: 30.8kg/m^2. Body surface area: BSA: 1.66m^2. Blood pressure: 130/75. Patient status: Outpatient. Location: [facility]  ------------------------------------------------------------  ------------------------------------------------------------ Left ventricle: The cavity size was normal. Wall thickness was increased in a pattern of moderate LVH. Systolic function was normal. The estimated ejection fraction was in the range of 60% to 65%. Wall motion was normal; there were no regional wall motion abnormalities. Doppler parameters are consistent with abnormal left ventricular relaxation (grade 1 diastolic dysfunction).  ------------------------------------------------------------ Aortic valve: Probably trileaflet; moderately thickened, moderately calcified leaflets. Doppler: There was severe stenosis. No regurgitation. VTI ratio of LVOT to aortic valve: 0.25. Peak velocity ratio of LVOT to aortic valve:  0.24. Mean gradient: 41mm Hg (S). Peak gradient: 70mm Hg (S).  ------------------------------------------------------------ Aorta: Aortic root: The aortic root was normal in size. Ascending aorta: The ascending aorta was normal in size.  ------------------------------------------------------------ Mitral valve: Calcified annulus. Moderately thickened leaflets . Doppler: The findings are consistent with trivial stenosis. Valve area by pressure half-time: 2.1cm^2. Indexed valve area by pressure half-time: 1.1cm^2/m^2. Mean gradient: 4mm Hg (D). Peak gradient: 10mm Hg (D).  ------------------------------------------------------------ Left atrium: The atrium was mildly dilated.  ------------------------------------------------------------ Right ventricle: The cavity size was normal. Wall thickness was normal. Systolic function was normal.  ------------------------------------------------------------ Pulmonic valve: Structurally normal valve. Cusp separation was normal. Doppler: Transvalvular velocity was within the normal range. No regurgitation.  ------------------------------------------------------------ Tricuspid valve: Structurally normal valve. Leaflet separation was normal. Doppler: Transvalvular velocity was within the normal range. Mild regurgitation.  ------------------------------------------------------------ Right atrium: The atrium was normal in size.  ------------------------------------------------------------ Pericardium: There was no pericardial effusion.  ------------------------------------------------------------  2D measurements Normal Doppler measurements Normal Left ventricle Main pulmonary LVID ED, 37.4 mm 43-52 artery chord, Pressure, 25 mm Hg =30 PLAX S LVID ES, 22.5 mm 23-38 Left ventricle chord, Ea, lat 5.04 cm/s ------ PLAX ann, tiss FS, chord, 40 % >29 DP PLAX E/Ea, lat 21.8 ------ LVPW, ED 18.2 mm ------ ann, tiss 3 IVS/LVPW 0.74 <1.3  DP ratio, ED Ea, med 4.75 cm/s ------ Ventricular septum ann, tiss IVS, ED 13.4 mm ------ DP Aorta E/Ea, med 23.1 ------ Root diam, 32 mm ------ ann, tiss 6 ED DP AAo AP 32 mm ------ LVOT diam, S Peak vel, 100 cm/s ------ Left atrium S AP dim 43 mm ------ VTI, S 21.5 cm ------ AP dim 2.25 cm/m^2 <2.2 Aortic valve index Peak vel, 419 cm/s ------ S Mean vel, 294 cm/s ------ S VTI, S 85 cm ------ Mean 41 mm Hg ------ gradient, S Peak 70 mm Hg ------ gradient, S VTI ratio  0.25 ------ LVOT/AV Peak vel 0.24 ------ ratio, LVOT/AV Mitral valve Peak E vel 110 cm/s ------ Peak A vel 131 cm/s ------ Mean vel, 95 cm/s ------ D Decelerati 348 ms 150-23 on time 0 Pressure 105 ms ------ half-time Mean 4 mm Hg ------ gradient, D Peak 10 mm Hg ------ gradient, D Peak E/A 0.8 ------ ratio Area (PHT) 2.1 cm^2 ------ Area index 1.1 cm^2/m ------ (PHT) ^2 Annulus 37.7 cm ------ VTI Tricuspid valve Regurg 222 cm/s ------ peak vel Peak RV-RA 20 mm Hg ------ gradient, S Systemic veins Estimated 5 mm Hg ------ CVP Right ventricle Pressure, 25 mm Hg <30 S Sa vel, 9.65 cm/s ------ lat ann, tiss DP  ------------------------------------------------------------ Prepared and Electronically Authenticated by  Cassell Clement [id number]  CARDIAC CATH:  Procedural Findings:  Hemodynamics  RA 8  RV 38/14  PA 41/18 with a mean of 28  PCWP 17  LV 148/26  AO 124/52 with a mean of 80  Oxygen saturations:  PA 62  AO 97  Cardiac Output (Fick) 3.7  Cardiac Index (Fick) 1.9  Aortic valve hemodynamics: Mean gradient 22, aortic valve area 0.9 cm    Coronary angiography:  Coronary dominance: right  Left mainstem: The left main is moderately calcified. The distal vessel has 40% stenosis.  Left anterior descending (LAD): The proximal LAD is diffusely diseased. The vessel is totally occluded before the first septal perforator.  Left circumflex (LCx): The  circumflex is totally occluded. The occlusion is at the ostium of the vessel. There is an intermediate branch is patent with severe distal disease.  Right coronary artery (RCA): The RCA is totally occluded in its proximal aspect. There are some bridging collaterals to the mid vessel.  Graft angiography:  LIMA to LAD: Widely patent throughout.  Saphenous vein graft to OM 1: Severe 80% stenosis in the mid body of the graft. Severe distal graft stenosis with marked degeneration and irregularity and 90% associated stenosis.  Saphenous vein graft sequential to PDA and PLA branches of the RCA: Widely patent throughout. The graft as mild stenosis probably in the valve in the proximal to mid body. Otherwise the graft is widely patent. The distal branch vessels are diffusely diseased but there are no areas of severe flow limiting stenosis.  Saphenous vein graft to acute marginal branch: Widely patent with no significant stenosis.  Left ventriculography: Left ventricular function is vigorous. The LVEF is estimated at 65-70%. There is no significant mitral regurgitation  Aortic root angiography: The proximal aorta is not dilated. The aortic valve is heavily calcified with restricted movement. There is mild aortic insufficiency present.  Abdominal aortic angiography: The distal abdominal aorta is patent without significant stenosis. The IMA is patent. The right common iliac artery is patent. The the internal iliac artery has 80% ostial stenosis with a normal flow pattern. The external iliac is severely diseased with very tight 99% calcified eccentric stenosis. The left iliac femoral artery is patent. The left external iliac artery has severe diffuse 80% stenosis. The internal iliac is patent. The iliac arteries are severely calcified bilaterally.  Final Conclusions:  1. Severe native three-vessel coronary artery disease with total occlusion of the RCA, LAD, and left circumflex  2. Status post aorta coronary bypass  surgery with continued patency of the LIMA to LAD, saphenous vein graft sequential to PDA and PLA branches of the RCA, and saphenous vein graft to the acute marginal.  3. 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  Normal left ventricular systolic function  5. Severe but not critical aortic stenosis  6. Severe iliofemoral disease bilaterally  Tonny Bollman  08/24/2012, 9:31 AM  Impression/Plan:  He has severe aortic stenosis with progressive exertional dyspnea. Cardiac catheterization shows that his left internal mammary graft and 3 of his 4 vein grafts are still widely patent and looked good. The vein graft to the obtuse marginal branch is diffusely and severely diseased. This a relatively small vessel with some distal disease in it. I reviewed his old operative note and I noted at the time of surgery that the obtuse marginal branch was only visible proximally and then became intramyocardial throughout its remaining extent. I doubt that it would be suitable for grafting under redo circumstances. I told him that I could evaluate that further in the operating room. I think aortic valve replacement is indicated to prevent worsening symptoms and left ventricular dysfunction. I would plan to use a tissue valve given his age. He has been on Coumadin for atrial flutter but I still think a tissue valve would be the best option for him at his age. I don't think there is any indication to attempt a Maze procedure under redo circumstances at 79 years old with patent coronary grafts. I discussed the operative procedure with the patient and family including alternatives, benefits and risks; including but not limited to bleeding, blood transfusion, infection, stroke, myocardial infarction, graft failure, heart block requiring a permanent pacemaker, organ dysfunction, and death. [name] understands and agrees to proceed. We will schedule surgery for next Thursday 09/01/2012.

## 2012-09-01 NOTE — Brief Op Note (Addendum)
09/01/2012  12:44 PM  PATIENT:  Adam [name]  77 y.o. male  PRE-OPERATIVE DIAGNOSIS:  1. Severe Aortic Stenosis 2.History of CAD (s/p CABG x 5 03')  POST-OPERATIVE DIAGNOSIS:  1.Severe Aortic Stenosis 2.History of CAD (s/p CABG x 5 03')  PROCEDURE:  INTRAOPERATIVE TRANSESOPHAGEAL ECHOCARDIOGRAM , RE DO MEDIAN STERNOTOMY for AORTIC VALVE REPLACEMENT (AVR) (Using a MAGNA EASE PERICARDIAL TISSUE VALVE, SIZE 21 MM)  OM not graftable.  SURGEON:  Surgeon(s) and Role:    * Alleen Borne, MD - Primary  PHYSICIAN ASSISTANT: Doree Fudge PA-C  ANESTHESIA:   general  EBL:  Total I/O In: 1900 [I.V.:1900] Out: 850 [Urine:850]  DRAINS:  Chest Tube(s) in the Mediastinal and Pleural spaces    SPECIMEN:  Source of Specimen:  Native aortic valve leaflets  DISPOSITION OF SPECIMEN:  PATHOLOGY  COUNTS:  YES  DICTATION: .Dragon Dictation  PLAN OF CARE: Admit to inpatient   PATIENT DISPOSITION:  ICU - intubated and hemodynamically stable.   Delay start of Pharmacological VTE agent (>24hrs) due to surgical blood loss or risk of bleeding: yes  PRE OP WEIGHT: 88.5 kg

## 2012-09-01 NOTE — Progress Notes (Signed)
Utilization Review Completed.Dowell, Deborah T3/13/2014  

## 2012-09-01 NOTE — Preoperative (Signed)
Beta Blockers   Reason not to administer Beta Blockers:Not Applicable 

## 2012-09-01 NOTE — Anesthesia Postprocedure Evaluation (Signed)
  Anesthesia Post-op Note  Patient: Adam [name]  Procedure(s) Performed: Procedure(s): AORTIC VALVE REPLACEMENT (AVR) (N/A) INTRAOPERATIVE TRANSESOPHAGEAL ECHOCARDIOGRAM (N/A)  Patient Location: SICU  Anesthesia Type:General  Level of Consciousness: sedated, unresponsive and Patient remains intubated per anesthesia plan  Airway and Oxygen Therapy: Patient Spontanous Breathing  Post-op Pain: none  Post-op Assessment: Patient's Cardiovascular Status Stable and Respiratory Function Stable  Post-op Vital Signs: stable  Complications: No apparent anesthesia complications

## 2012-09-01 NOTE — Progress Notes (Signed)
  Echocardiogram Echocardiogram Transesophageal has been performed.  MORFORD, MELISSA 09/01/2012, 8:37 AM

## 2012-09-02 ENCOUNTER — Inpatient Hospital Stay (HOSPITAL_COMMUNITY): Payer: Medicare Other

## 2012-09-02 LAB — MAGNESIUM
Magnesium: 2.5 mg/dL (ref 1.5–2.5)
Magnesium: 2.2 mg/dL (ref 1.5–2.5)

## 2012-09-02 LAB — BASIC METABOLIC PANEL
Calcium: 7.7 mg/dL — ABNORMAL LOW (ref 8.4–10.5)
Creatinine, Ser: 0.89 mg/dL (ref 0.50–1.35)
GFR calc non Af Amer: 82 mL/min — ABNORMAL LOW (ref >90)
Sodium: 138 mEq/L (ref 135–145)

## 2012-09-02 LAB — GLUCOSE, CAPILLARY
Glucose-Capillary: 132 mg/dL — ABNORMAL HIGH (ref 70–99)
Glucose-Capillary: 137 mg/dL — ABNORMAL HIGH (ref 70–99)
Glucose-Capillary: 146 mg/dL — ABNORMAL HIGH (ref 70–99)
Glucose-Capillary: 146 mg/dL — ABNORMAL HIGH (ref 70–99)
Glucose-Capillary: 149 mg/dL — ABNORMAL HIGH (ref 70–99)
Glucose-Capillary: 159 mg/dL — ABNORMAL HIGH (ref 70–99)
Glucose-Capillary: 206 mg/dL — ABNORMAL HIGH (ref 70–99)
Glucose-Capillary: 76 mg/dL (ref 70–99)
Glucose-Capillary: 96 mg/dL (ref 70–99)

## 2012-09-02 LAB — CREATININE, SERUM
GFR calc Af Amer: >90 mL/min (ref >90)
GFR calc non Af Amer: 81 mL/min — ABNORMAL LOW (ref >90)

## 2012-09-02 LAB — CBC
HCT: 30.9 % — ABNORMAL LOW (ref 39.0–52.0)
Hemoglobin: 10.5 g/dL — ABNORMAL LOW (ref 13.0–17.0)
MCH: 29.5 pg (ref 26.0–34.0)
MCHC: 34.7 g/dL (ref 30.0–36.0)
MCV: 85.8 fL (ref 78.0–100.0)
Platelets: 170 10*3/uL (ref 150–400)
RBC: 3.22 MIL/uL — ABNORMAL LOW (ref 4.22–5.81)
RDW: 13.7 % (ref 11.5–15.5)
RDW: 13.8 % (ref 11.5–15.5)
WBC: 17 10*3/uL — ABNORMAL HIGH (ref 4.0–10.5)

## 2012-09-02 LAB — POCT I-STAT, CHEM 8
Calcium, Ion: 1.07 mmol/L — ABNORMAL LOW (ref 1.13–1.30)
HCT: 30 % — ABNORMAL LOW (ref 39.0–52.0)
Hemoglobin: 10.2 g/dL — ABNORMAL LOW (ref 13.0–17.0)
Sodium: 136 mEq/L (ref 135–145)
TCO2: 26 mmol/L (ref 0–100)

## 2012-09-02 MED ORDER — ALBUTEROL SULFATE HFA 108 (90 BASE) MCG/ACT IN AERS: 2.0000 | INHALATION_SPRAY | Freq: Four times a day (QID) | RESPIRATORY_TRACT | Status: DC | PRN

## 2012-09-02 MED ORDER — INSULIN ASPART 100 UNIT/ML ~~LOC~~ SOLN
0.0000 [IU] | SUBCUTANEOUS | Status: DC
  Administered 2012-09-02 – 2012-09-03 (×5): 2 [IU] via SUBCUTANEOUS

## 2012-09-02 MED ORDER — METOPROLOL TARTRATE 25 MG PO TABS
25.0000 mg | ORAL_TABLET | Freq: Two times a day (BID) | ORAL | Status: DC
  Administered 2012-09-02: 25 mg via ORAL
  Filled 2012-09-02 (×2): qty 1

## 2012-09-02 MED ORDER — INSULIN DETEMIR 100 UNIT/ML ~~LOC~~ SOLN
30.0000 [IU] | Freq: Two times a day (BID) | SUBCUTANEOUS | Status: DC
  Administered 2012-09-02 – 2012-09-04 (×6): 30 [IU] via SUBCUTANEOUS

## 2012-09-02 MED ORDER — METOPROLOL TARTRATE 25 MG/10 ML ORAL SUSPENSION
12.5000 mg | Freq: Two times a day (BID) | ORAL | Status: DC
  Filled 2012-09-02 (×2): qty 5

## 2012-09-02 MED ORDER — ENOXAPARIN SODIUM 40 MG/0.4ML ~~LOC~~ SOLN
40.0000 mg | Freq: Every day | SUBCUTANEOUS | Status: DC
  Administered 2012-09-02 – 2012-09-05 (×4): 40 mg via SUBCUTANEOUS
  Filled 2012-09-02 (×5): qty 0.4

## 2012-09-02 MED ORDER — POTASSIUM CHLORIDE CRYS ER 20 MEQ PO TBCR
20.0000 meq | EXTENDED_RELEASE_TABLET | Freq: Two times a day (BID) | ORAL | Status: AC
  Administered 2012-09-02 (×2): 20 meq via ORAL
  Filled 2012-09-02 (×2): qty 1

## 2012-09-02 MED ORDER — METOPROLOL TARTRATE 25 MG PO TABS
25.0000 mg | ORAL_TABLET | Freq: Three times a day (TID) | ORAL | Status: DC
  Administered 2012-09-02: 25 mg via ORAL
  Filled 2012-09-02 (×4): qty 1

## 2012-09-02 MED ORDER — INSULIN ASPART 100 UNIT/ML ~~LOC~~ SOLN
4.0000 [IU] | Freq: Three times a day (TID) | SUBCUTANEOUS | Status: DC
  Administered 2012-09-02 – 2012-09-03 (×3): 4 [IU] via SUBCUTANEOUS

## 2012-09-02 MED ORDER — FUROSEMIDE 10 MG/ML IJ SOLN
40.0000 mg | Freq: Two times a day (BID) | INTRAMUSCULAR | Status: AC
  Administered 2012-09-02 (×2): 40 mg via INTRAVENOUS
  Filled 2012-09-02: qty 4

## 2012-09-02 MED FILL — Potassium Chloride Inj 2 mEq/ML: INTRAVENOUS | Qty: 40 | Status: AC

## 2012-09-02 MED FILL — Magnesium Sulfate Inj 50%: INTRAMUSCULAR | Qty: 10 | Status: AC

## 2012-09-02 NOTE — Op Note (Signed)
NAMECORDAY, WYKA NO.:  [id number]  MEDICAL RECORD NO.:  [id number]  LOCATION:  2301                         FACILITY:  [facility]  PHYSICIAN:  Evelene Croon, M.D.     DATE OF BIRTH:  1934-12-29  DATE OF PROCEDURE: DATE OF DISCHARGE:                              OPERATIVE REPORT   PREOPERATIVE DIAGNOSIS:  Severe aortic stenosis.  POSTOPERATIVE DIAGNOSIS:  Severe aortic stenosis.  OPERATIVE PROCEDURE:  Redo median sternotomy, extracorporeal circulation, aortic valve replacement using a 21-mm Edwards pericardial Magna-Ease valve.  ATTENDING SURGEON:  Evelene Croon, MD  ASSISTANT:  Doree Fudge, PA-C.  ANESTHESIA:  General endotracheal.  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 He had a left internal mammary graft to LAD with a saphenous vein graft to the intermediate coronary artery, saphenous vein graft sequentially to the posterior descending branch of the right coronary artery and the posterolateral branch of the left circumflex coronary artery, and a saphenous vein graft to the obtuse marginal branch of the left circumflex coronary artery.  He did well until past 6 months, and he has had slowly progressive shortness of breath.  He has had occasional episodes of stress pressure and shortness of breath is worse to the point where he cannot do much without getting short of breath.  A 2D echocardiogram showed severe aortic stenosis with a peak velocity of 4.19 m/sec with a peak gradient of 70 mmHg and a mean gradient of 41 mmHg.  Cardiac catheterization showed normal right and left heart pressures.  The mean gradient across the valve was measured at 22 with an aortic valve area 0.9 cm sq.  Coronary angiography showed the left circumflex to be totally occluded at its ostium.  The right coronary artery was totally occluded proximally.  The LAD was  diffusely diseased proximally and totally occluded before the first septal perforator.  The left internal mammary graft to the LAD was widely patent.  Vein graft to the intermediate was patent.  The vein graft to the obtuse marginal was diffusely diseased with about 80% stenosis in the midportion and severe distal graft stenosis.  The obtuse marginal branch itself appeared relatively small and had some distal disease in it.  The vein graft to the posterior descending and posterolateral branches was patent with some mild proximal irregularity most likely due to a valve.  The saphenous vein graft to the acute marginal was widely patent.  Left ventricular ejection fraction was 65-70% with no mitral regurgitation. Aortic root angiography showed the proximal aorta to be of normal size with some calcification on the mid ascending aorta posteriorly as well as some calcified plaque in the aortic root.  The aortic valve was heavily calcified with poor leaflet motion.  Abdominal aortography showed 80% ostial stenosis of the right internal iliac artery.  The right external iliac artery was severely diseased with 99% calcific eccentric stenosis.  The left external iliac artery had severe diffuse 80% stenosis with a patent internal iliac artery on that side.  After review of the studies and examination, the patient was felt that the aortic valve replacement  was indicated.  He was evaluated for consideration of transcatheter aortic valve replacement, but his operative risk was calculated to be too low to warrant transcatheter aortic valve replacement.  He was felt to be a surgical candidate, and therefore, we made plans to proceed with traditional aortic valve replacement.  I felt a tissue valve would be best given his age.  I discussed the operative procedure with the patient and his wife including alternatives, benefits, and risks including, but not limited to bleeding, blood transfusion, infection,  stroke, myocardial infarction, graft failure, heart block requiring permanent pacemaker, organ dysfunction, and death.  He understood and agreed to proceed.  OPERATIVE PROCEDURE:  The patient was seen in the preoperative holding area and proper patient, proper operation were confirmed after reviewing the chart and discussing with the patient.  The consent was signed.  He was taken back to the operating room and placed on table in supine position.  Preoperative intravenous antibiotics were given.  After induction of general endotracheal anesthesia, a Foley catheter was placed in bladder using sterile technique.  Then, the chest, abdomen, and both lower extremities were prepped and draped in usual sterile manner.  TEE was performed by Anesthesiology and showed severe calcific aortic stenosis.  There was moderate concentric left ventricular hypertrophy with good left ventricular function.  There was mild mitral regurgitation.  Then time-out was taken.  Proper patient, proper operation were confirmed with nursing and anesthesia staff.  The chest was then entered through the previous median sternotomy incision.  The sternal wires were removed.  The sternum was opened using the oscillating saw without difficulty.  The sternal edges were retracted with bone hooks and the sternum was separated from the heart using electrocautery.  The chest retractor was placed.  Then dissection was performed to expose the right atrium and ascending aorta.  The previous vein grafts were identified and carefully avoided.  The ascending aorta had palpable plaque posteriorly in the mid-to-distal ascending aorta, as well as in the aortic root anteriorly and posteriorly.  The aorta was of normal size. Then, the patient was heparinized when an adequate ACT was obtained. The distal ascending aorta was cannulated using a 20-French aortic cannula for arterial inflow.  Venous outflow was achieved using a two- stage  venous cannula for the right atrial free wall.  The patient was then placed on cardiopulmonary bypass.  The remainder of the right atrium was dissected free.  The right pulmonary veins were identified. Then, a left ventricular vent was placed through the right superior pulmonary vein.  A retrograde cardioplegia cannula was inserted through a purse-string suture in the right atrium and advanced in the coronary sinus.  An antegrade cardioplegia and vent cannula was inserted into the mid ascending aorta.  Then, the left anterior descending artery was located.  This was traced proximally and the left internal mammary pedicle was identified as it entered the pericardium.  It was carefully encircled.  Then, the patient was cooled to 32 degrees centigrade.  The aorta was crossclamped and 1000 mL of cold blood antegrade cardioplegia was administered into the ascending aorta with quick arrest of the heart.  Systemic hypothermia to 32 degrees centigrade and topical hypothermia with iced saline was used. A temperature probe was placed in septum insulating pad in the pericardium, cold blood retrograde cardioplegia was given about 20 minute intervals throughout the remainder of the case to maintain myocardial temperature around 10 degrees centigrade or less.  Then, the remainder of the heart was  freed from the pericardial adhesions.  The obtuse marginal coronary artery was not felt to be graftable.  This vessel was intramyocardial and relatively small on catheterization and given the redo circumstances that I did not feel this would be suitable for grafting.  Then, the sequential graft to the posterior descending and posterolateral branches, was freed to allow retraction out of the way to expose the aortic root.  The acute marginal vein graft was likewise freed up to allow retraction.  The aortic root had some plaque present anteriorly and posteriorly.  There was enough room to open the  aortic root and this was opened transversely about a cm below the takeoff of the old vein grafts.  Examination of the aortic valve showed there were 3 leaflets that were heavily calcified and immobile.  There was moderate annular calcification.  The left coronary ostium was identified and there was no obstruction.  The right coronary ostium was also identified and there did not appear to be any obstruction.  Then, the native valve leaflets were excised.  The anulus was decalcified with rongeurs.  Care was taken to remove all particulate debris.  Left ventricle and aortic root were irrigated with iced saline solution.  Then, the anulus was measured and a 21-mm Edwards pericardial Magna-Ease valve was chosen. This had model number [id number] and serial number [id number].  Then, a series of pledgeted 2-0 Ethibond horizontal mattress sutures were placed around the aortic anulus with the pledgets in the subannular position.  The sutures were placed through the sewing ring and the valve lowered into place.  The sutures were tied sequentially.  The valve seated nicely. The coronary ostia were not obstructed.  The patient was then rewarmed to 37 degrees centigrade.  The aortotomy was closed in 2 layers using continuous 4-0 Prolene suture with Felt strips to reinforce the aorta. After the aorta was closed, the left side of the heart was de-aired.  We did insufflate CO2 into the pericardium throughout the case to minimize the intracardiac air.  After de-airing maneuvers were performed, the clamp was removed from the left mammary pedicle and there was rapid warming of the ventricular septum.  The cross-clamp was then removed with time of 107 minutes.  The patient appeared to be in complete heart block with no ventricular escape.  Two temporary right ventricular and right atrial pacing wires placed and brought out through the skin.  The patient was then paced in AV sequential mode at 80 beats per  minute. The aortotomy appeared hemostatic.  When the patient rewarmed to 37 degrees centigrade, he was weaned from cardiopulmonary bypass on low- dose dopamine.  Total bypass time was 169 minutes.  He showed a normal functioning aortic valve prosthesis.  Left ventricular function appeared well preserved.  There was trivial mitral regurgitation.  Cardiac output was 5 liters/minute.  Then, protamine was given and the venous and aortic cannulas were removed without difficulty.  Hemostasis was achieved.  Two chest tubes were placed with 2 in the posterior pericardium one anterior mediastinum.  The sternum was then closed with double #6 stainless steel wires.  Fascia was closed with continuous #1 Vicryl suture.  Subcutaneous tissue was closed with continuous 2-0 Vicryl and skin with a 3-0 Vicryl subcuticular closure.  The sponge, needle, and instrument counts were correct according to the scrub nurse. Dry sterile dressings were applied over the incisions around the chest tubes, which were hooked to Pleur-Evac suction.  The patient remained hemodynamically stable and transferred to  the SICU in guarded, but stable condition.     Evelene Croon, M.D.     BB/MEDQ  D:  09/01/2012  T:  09/02/2012  Job:  [id number]

## 2012-09-02 NOTE — Clinical Documentation Improvement (Signed)
Anemia Blood Loss Clarification  THIS DOCUMENT IS NOT A PERMANENT PART OF THE MEDICAL RECORD  RESPOND TO THE THIS QUERY, FOLLOW THE INSTRUCTIONS BELOW:  1. If needed, update documentation for the patient's encounter via the notes activity.  2. Access this query again and click edit on the In [employer].  3. After updating, or not, click F2 to complete all highlighted (required) fields concerning your review. Select "additional documentation in the medical record" OR "no additional documentation provided".  4. Click Sign note button.  5. The deficiency will fall out of your In Basket *Please let us know if you are not able to complete this workflow by phone or e-mail (listed below).        09/02/12  Dear Dr. Laneta Simmers Marton Redwood  In an effort to better capture your patient's severity of illness, reflect appropriate length of stay and utilization of resources, a review of the patient medical record has revealed the following indicators.    Based on your clinical judgment, please clarify and document in a progress note and/or discharge summary the clinical condition associated with the following supporting information:  In responding to this query please exercise your independent judgment.  The fact that a query is asked, does not imply that any particular answer is desired or expected.   Possible Clinical Conditions?   " Expected Acute Blood Loss Anemia  " Acute Blood Loss Anemia  " Acute on chronic blood loss anemia  " Precipitous drop in Hematocrit  " Other Condition  " Cannot Clinically Determine    Risk Factors:  09/01/12:  EBL: per Anesthesia record.  Diagnostics: H&H on 3/12:  13.6/39.3 H&H on 3/13:   8.8/26.0 H&H on 3/14:   9.5/27.4  IV fluids / plasma expanders: 3/13:  Cell saver: per Anesthesia record. 3/13:  Albumin human, 5%: per Anesthesia record. 3/13:  0.9% NaCl: per Anesthesia record. 3/13:  LR: per Anesthesia  record.   Reviewed: Acute blood loss anemia documented per 3/17 progress notes.  Thank You,  Marciano Sequin,  Clinical Documentation Specialist:  Pager: [phone]  Phone: [phone]  Health Information Management [place]

## 2012-09-02 NOTE — Care Management Note (Signed)
  Page 2 of 2   09/06/2012     1:35:19 PM   CARE MANAGEMENT NOTE 09/06/2012  Patient:  Adam Adam [name], Adam Adam [name]   Account Number:  [id number]  Date Initiated:  09/02/2012  Documentation initiated by:  [facility]  Subjective/Objective Assessment:   AVR on 3-13-  has Adam [name]     DC Planning Services  CM consult      Choice offered to / List presented to:  C-3 Adam [name]   DME arranged  [facility]      DME agency  [facility]    HH arranged  HH-1 RN      [facility] agency  [facility]   Status of service:  Completed, signed off Medicare Important Message given?  YES Date Medicare IM given:  09/06/2012  Discharge Disposition:  HOME W HOME HEALTH SERVICES  Per UR Regulation:  Reviewed for med. necessity/level of care/duration of stay  Comments:  Contact:  Adam Adam [name],Adam Adam [name] [phone]                 Adam Adam [name] Daughter [phone]  09/06/12 1214 Adam Clinkscales RN  BSN MSN CCM Pt/Adam [name] now state pt will not start cardiac rehab for 3 wks and will need home health RN.  Provided list of agencies, [name], [name], [name]  09/05/12 1017 Adam Gaulin RN BSN MSN CCM Pt is anticipating discharge tomorrow, states he will not need any home health services as he plans to attend cardiac rehab beginning next week.  Has walker @ home.  09-02-12 9:45am Adam Adam [name], RNBSN - [phone] 1 day post op - Awake, alert, laying in bed - getting ready to get OOB.  Verified lives with wife and two daughters are home now to stay with him also post discharge - CM will continue to followf for further needs.

## 2012-09-02 NOTE — Progress Notes (Signed)
1 Day Post-Op Procedure(s) (LRB): AORTIC VALVE REPLACEMENT (AVR) (N/A) INTRAOPERATIVE TRANSESOPHAGEAL ECHOCARDIOGRAM (N/A) Subjective: No complaints  Objective: Vital signs in last 24 hours: Temp:  [95.5 F (35.3 C)-103.5 F (39.7 C)] 100.6 F (38.1 C) (03/14 0700) Pulse Rate:  [57-123] 98 (03/14 0700) Cardiac Rhythm:  [-]  Resp:  [1-31] 25 (03/14 0700) BP: (83-153)/(39-62) 117/39 mmHg (03/14 0600) SpO2:  [75 %-99 %] 96 % (03/14 0700) Arterial Line BP: (82-222)/(40-81) 138/50 mmHg (03/14 0700) FiO2 (%):  [40 %-51.3 %] 40 % (03/13 1800) Weight:  [88.451 kg (195 lb)-91.8 kg (202 lb 6.1 oz)] 91.8 kg (202 lb 6.1 oz) (03/14 0600)  Hemodynamic parameters for last 24 hours: PAP: (31-68)/(13-35) 44/16 mmHg CO:  [4 L/min-7.3 L/min] 4.7 L/min CI:  [2.1 L/min/m2-3.8 L/min/m2] 2.4 L/min/m2  Intake/Output from previous day: 03/13 0701 - 03/14 0700 In: 6824.3 [P.O.:180; I.V.:4964.3; Blood:320; IV Piggyback:1300] Out: 4065 [Urine:3215; Blood:600; Chest Tube:250] Intake/Output this shift:    General appearance: alert and cooperative Neurologic: intact Heart: regular rate and rhythm, S1, S2 normal, no murmur, click, rub or gallop Lungs: rales bilaterally Extremities: edema mild Wound: dressing dry  Lab Results:  Recent Labs  09/01/12 2045 09/01/12 2144 09/02/12 0318  WBC 15.0*  --  12.3*  HGB 10.9* 9.9* 9.5*  HCT 31.7* 29.0* 27.4*  PLT 215  --  170   BMET:  Recent Labs  08/31/12 1411  09/01/12 2144 09/02/12 0318  NA 139  < > 137 138  K 4.3  < > 4.4 3.9  CL 104  --  108 105  CO2 22  --   --  24  GLUCOSE 87  < > 184* 84  BUN 18  --  16 17  CREATININE 0.81  < > 0.70 0.89  CALCIUM 9.4  --   --  7.7*  < > = values in this interval not displayed.  PT/INR:  Recent Labs  09/01/12 1400  LABPROT 15.4*  INR 1.24   ABG    Component Value Date/Time   PHART 7.354 09/01/2012 2140   HCO3 20.9 09/01/2012 2140   TCO2 22 09/01/2012 2144   ACIDBASEDEF 4.0* 09/01/2012 2140   O2SAT 93.0 09/01/2012 2140   CBG (last 3)   Recent Labs  09/02/12 0318 09/02/12 0413 09/02/12 0530  GLUCAP 76 97 115*   Cxr: ok  ECG: sinus, bifascicular block   Assessment/Plan: S/P Procedure(s) (LRB): AORTIC VALVE REPLACEMENT (AVR) (N/A) INTRAOPERATIVE TRANSESOPHAGEAL ECHOCARDIOGRAM (N/A) Brief complete heart block overnight. Will keep VVI pacer on backup. He had bifascicular block preop. Mobilize Diuresis Diabetes control d/c tubes/lines See progression orders   LOS: 1 day    BARTLE,BRYAN K 09/02/2012

## 2012-09-02 NOTE — Progress Notes (Signed)
Patient ID: Adam [name], male   DOB: 25-Feb-1935, 78 y.o.   MRN: [id number]  SICU Evening Rounds:  Hemodynamically stable. Sinus tachy 110. Will increase Lopressor.  Urine output good.  Ambulated.

## 2012-09-02 NOTE — Progress Notes (Signed)
Pt with frequent complaints of bladder pain and an inability to void. Foley cath output minimal and blood tinged. Pt with periods of fullness in bladder. During these times pt would void around the foley cath. Cath flushes with 60 ml normal saline flush leaked around the foley. Bladder scanned, 243 ml present. Md notified cath discontinued. Large clot expelled after cath discontinuation. Ned 16 fr foley cath placed per Md order. 250 ml retrieved immediately. 10 ml instilled in balloon. Pt tolerated well.

## 2012-09-02 NOTE — Op Note (Signed)
Adam [name], Adam [name] NO.:  [id number]  MEDICAL RECORD NO.:  [id number]  LOCATION:  2301                         FACILITY:  [facility]  PHYSICIAN:  Burna Forts, M.D.DATE OF BIRTH:  1935-01-05  DATE OF PROCEDURE:  09/01/2012 DATE OF DISCHARGE:                              OPERATIVE REPORT   INDICATION FOR PROCEDURE:  Adam [name] is brought to the holding area the morning of surgery where under local anesthesia with sedation, pulmonary artery and radial arterial lines were placed.  We have been asked to place TEE probe during surgery for evaluation of cardiac structures and function.  After routine induction of general anesthesia, the TEE probe was prepared and then passed oropharyngeally into the stomach,then slightly withdrawn for imaging of the cardiac structures.  Left ventricular chamber:  The left ventricular chamber is seen initially in the short axis view.  There is mild concentric left ventricular hypertrophy.  There is good to  excellent overall contractile pattern appreciated.  All segmental wall areas are contractile and thickened during systole.  Long-axis views again showed good overall inferior wall and anterior wall contractile pattern.  Papillary muscles are well outlined.  Mitral valve:  In a 4-chamber view, we see the mitral valve is a thickened mitral valve apparatus.  There is apparent degeneration in the leaflets.  There is calcium in the annular areas of the mitral valve. There is also segment of calcium on the posterior leaflet of the mitral valve as well.  Multiple views are carried out.  The predominance of calcium is in the annular area of the mitral valve.  The valve itself appears to open satisfactorily, however, during diastole.  During systolic contraction and with color Doppler, there is only trivial regurgitant flow appreciated across this mitral valve.  It is a competent valve.  Aortic valve:  The aortic valve is seen  initially in a short-axis view. It is heavily calcified.  There are 3 cusps.  There is significant restriction to opening during systolic contraction.  Multiple views are obtained in both long and short axis.  These views again show that this is likely severe aortic stenosis.  Color Doppler reveals a 3+ jet of stenotic flow above the level of the valve into the aorta. Hemodynamic parameters are measured with a peak gradient measured at approximately 68-70 with mean gradients of 38-40.  Calculated aortic valve area is somewhere in the range of 0.7 cm2.  Right ventricle:  Normal right ventricular chamber is appreciated.  Tricuspid valve:  Thin compliant mobile tricuspid valve.  Right and left atrial with normal structures in size, shape, and function.  The patient is placed on cardiopulmonary bypass.  The aortotomy is performed and the aortic valve replacement with a pericardial tissue valve is performed by Dr. Laneta Simmers.  De-airing maneuvers were subsequently carried out and the patient is separated from cardiopulmonary bypass with the initial attempt.  POST CARDIOPULMONARY BYPASS TEE EXAMINATION (LIMITED EXAM):  Left ventricle:  The left ventricular chamber seen initially in the early post bypass view, there is some dyssynergy of left ventricular contractile pattern appreciated but this could very well likely be the atrial and ventricular pacing that was initiated.  Overall  with time and separation from cardiopulmonary bypass, there was significant improvement of the left ventricular contractile pattern such that there was good excellent contractile pattern and contractility noted 10-15 minutes after separation of cardiopulmonary bypass.  Attention was then given to the aortic valve.  We could see the 3 struts of the aortic valve that was well placed in the aortic annular area. The leaflets were seen.  The edges were thin, compliant, and mobile.  It appeared to be a satisfactorily  replaced aortic valve structure. The rest of the cardiac examination was without significant change and the patient was ultimately returned to the cardiac intensive care unit in stable condition.          ______________________________ Burna Forts, M.D.     JTM/MEDQ  D:  09/01/2012  T:  09/02/2012  Job:  [id number]

## 2012-09-03 ENCOUNTER — Encounter (HOSPITAL_COMMUNITY): Payer: Self-pay | Admitting: Surgery

## 2012-09-03 ENCOUNTER — Other Ambulatory Visit: Payer: Self-pay

## 2012-09-03 ENCOUNTER — Inpatient Hospital Stay (HOSPITAL_COMMUNITY): Payer: Medicare Other

## 2012-09-03 LAB — BASIC METABOLIC PANEL
CO2: 25 mEq/L (ref 19–32)
Calcium: 8 mg/dL — ABNORMAL LOW (ref 8.4–10.5)
Creatinine, Ser: 0.78 mg/dL (ref 0.50–1.35)
GFR calc non Af Amer: 86 mL/min — ABNORMAL LOW (ref >90)
Glucose, Bld: 147 mg/dL — ABNORMAL HIGH (ref 70–99)
Sodium: 136 mEq/L (ref 135–145)

## 2012-09-03 LAB — CBC
MCH: 29.1 pg (ref 26.0–34.0)
MCHC: 34 g/dL (ref 30.0–36.0)
MCV: 85.5 fL (ref 78.0–100.0)
Platelets: 200 10*3/uL (ref 150–400)
RBC: 3.58 MIL/uL — ABNORMAL LOW (ref 4.22–5.81)
RDW: 13.9 % (ref 11.5–15.5)

## 2012-09-03 LAB — TYPE AND SCREEN

## 2012-09-03 LAB — GLUCOSE, CAPILLARY
Glucose-Capillary: 150 mg/dL — ABNORMAL HIGH (ref 70–99)
Glucose-Capillary: 176 mg/dL — ABNORMAL HIGH (ref 70–99)
Glucose-Capillary: 117 mg/dL — ABNORMAL HIGH (ref 70–99)

## 2012-09-03 MED ORDER — ACETAMINOPHEN 325 MG PO TABS
650.0000 mg | ORAL_TABLET | Freq: Four times a day (QID) | ORAL | Status: DC | PRN
  Administered 2012-09-03: 650 mg via ORAL
  Filled 2012-09-03: qty 2

## 2012-09-03 MED ORDER — DOCUSATE SODIUM 100 MG PO CAPS
200.0000 mg | ORAL_CAPSULE | Freq: Every day | ORAL | Status: DC
  Administered 2012-09-04 – 2012-09-05 (×2): 200 mg via ORAL
  Filled 2012-09-03 (×3): qty 2

## 2012-09-03 MED ORDER — POTASSIUM CHLORIDE 10 MEQ/50ML IV SOLN
10.0000 meq | INTRAVENOUS | Status: AC | PRN
  Administered 2012-09-03 (×3): 10 meq via INTRAVENOUS
  Filled 2012-09-03: qty 150

## 2012-09-03 MED ORDER — ASPIRIN EC 325 MG PO TBEC
325.0000 mg | DELAYED_RELEASE_TABLET | Freq: Every day | ORAL | Status: DC
  Administered 2012-09-03 – 2012-09-06 (×4): 325 mg via ORAL
  Filled 2012-09-03 (×5): qty 1

## 2012-09-03 MED ORDER — MOVING RIGHT ALONG BOOK
Freq: Once | Status: AC
  Administered 2012-09-03: 18:00:00
  Filled 2012-09-03 (×2): qty 1

## 2012-09-03 MED ORDER — TRAMADOL HCL 50 MG PO TABS
50.0000 mg | ORAL_TABLET | ORAL | Status: DC | PRN
  Administered 2012-09-05: 50 mg via ORAL
  Administered 2012-09-05: 100 mg via ORAL
  Filled 2012-09-03: qty 2
  Filled 2012-09-03: qty 1

## 2012-09-03 MED ORDER — ONDANSETRON HCL 4 MG/2ML IJ SOLN: 4.0000 mg | Freq: Four times a day (QID) | INTRAMUSCULAR | Status: DC | PRN

## 2012-09-03 MED ORDER — POTASSIUM CHLORIDE CRYS ER 20 MEQ PO TBCR
40.0000 meq | EXTENDED_RELEASE_TABLET | Freq: Once | ORAL | Status: AC
  Administered 2012-09-03: 40 meq via ORAL
  Filled 2012-09-03: qty 2

## 2012-09-03 MED ORDER — BISACODYL 10 MG RE SUPP: 10.0000 mg | Freq: Every day | RECTAL | Status: DC | PRN

## 2012-09-03 MED ORDER — METFORMIN HCL 500 MG PO TABS
1000.0000 mg | ORAL_TABLET | Freq: Two times a day (BID) | ORAL | Status: DC
  Administered 2012-09-03 – 2012-09-04 (×4): 1000 mg via ORAL
  Filled 2012-09-03 (×7): qty 2

## 2012-09-03 MED ORDER — FUROSEMIDE 10 MG/ML IJ SOLN
40.0000 mg | Freq: Once | INTRAMUSCULAR | Status: AC
  Administered 2012-09-03: 40 mg via INTRAVENOUS
  Filled 2012-09-03: qty 4

## 2012-09-03 MED ORDER — FUROSEMIDE 40 MG PO TABS
40.0000 mg | ORAL_TABLET | Freq: Every day | ORAL | Status: AC
  Administered 2012-09-04 – 2012-09-06 (×3): 40 mg via ORAL
  Filled 2012-09-03 (×3): qty 1

## 2012-09-03 MED ORDER — BISACODYL 5 MG PO TBEC: 10.0000 mg | DELAYED_RELEASE_TABLET | Freq: Every day | ORAL | Status: DC | PRN

## 2012-09-03 MED ORDER — OXYCODONE HCL 5 MG PO TABS
5.0000 mg | ORAL_TABLET | ORAL | Status: DC | PRN
  Administered 2012-09-03: 10 mg via ORAL
  Administered 2012-09-04: 5 mg via ORAL
  Filled 2012-09-03: qty 2
  Filled 2012-09-03: qty 1

## 2012-09-03 MED ORDER — SODIUM CHLORIDE 0.9 % IV SOLN: 250.0000 mL | INTRAVENOUS | Status: DC | PRN

## 2012-09-03 MED ORDER — PANTOPRAZOLE SODIUM 40 MG PO TBEC
40.0000 mg | DELAYED_RELEASE_TABLET | Freq: Every day | ORAL | Status: DC
  Administered 2012-09-04 – 2012-09-06 (×3): 40 mg via ORAL
  Filled 2012-09-03 (×3): qty 1

## 2012-09-03 MED ORDER — INSULIN ASPART 100 UNIT/ML ~~LOC~~ SOLN
0.0000 [IU] | SUBCUTANEOUS | Status: DC
  Administered 2012-09-03: 4 [IU] via SUBCUTANEOUS

## 2012-09-03 MED ORDER — SODIUM CHLORIDE 0.9 % IJ SOLN
3.0000 mL | Freq: Two times a day (BID) | INTRAMUSCULAR | Status: DC
  Administered 2012-09-03: 22:00:00 via INTRAVENOUS
  Administered 2012-09-05: 3 mL via INTRAVENOUS

## 2012-09-03 MED ORDER — POTASSIUM CHLORIDE CRYS ER 20 MEQ PO TBCR
40.0000 meq | EXTENDED_RELEASE_TABLET | Freq: Every day | ORAL | Status: DC
  Administered 2012-09-04: 40 meq via ORAL
  Filled 2012-09-03 (×2): qty 2

## 2012-09-03 MED ORDER — ONDANSETRON HCL 4 MG PO TABS: 4.0000 mg | ORAL_TABLET | Freq: Four times a day (QID) | ORAL | Status: DC | PRN

## 2012-09-03 MED ORDER — SODIUM CHLORIDE 0.9 % IJ SOLN: 3.0000 mL | INTRAMUSCULAR | Status: DC | PRN

## 2012-09-03 MED ORDER — METOPROLOL TARTRATE 50 MG PO TABS
75.0000 mg | ORAL_TABLET | Freq: Two times a day (BID) | ORAL | Status: DC
  Administered 2012-09-03 – 2012-09-06 (×7): 75 mg via ORAL
  Filled 2012-09-03 (×8): qty 1

## 2012-09-03 NOTE — Progress Notes (Addendum)
2 Days Post-Op Procedure(s) (LRB): AORTIC VALVE REPLACEMENT (AVR) (N/A) INTRAOPERATIVE TRANSESOPHAGEAL ECHOCARDIOGRAM (N/A) Subjective: No complaints  Objective: Vital signs in last 24 hours: Temp:  [97.8 F (36.6 C)-100 F (37.8 C)] 98.4 F (36.9 C) (03/15 0743) Pulse Rate:  [86-129] 120 (03/15 0700) Cardiac Rhythm:  [-] Sinus tachycardia (03/15 0400) Resp:  [10-33] 20 (03/15 0700) BP: (104-179)/(55-80) 154/61 mmHg (03/15 0700) SpO2:  [92 %-98 %] 97 % (03/15 0700) Arterial Line BP: (139-151)/(46-61) 143/58 mmHg (03/14 1200) Weight:  [90.2 kg (198 lb 13.7 oz)] 90.2 kg (198 lb 13.7 oz) (03/15 0500)  Hemodynamic parameters for last 24 hours: PAP: (47)/(16) 47/16 mmHg CO:  [5.8 L/min] 5.8 L/min CI:  [3 L/min/m2] 3 L/min/m2  Intake/Output from previous day: 03/14 0701 - 03/15 0700 In: 1279.2 [P.O.:600; I.V.:479.2; IV Piggyback:200] Out: 3310 [Urine:3260; Chest Tube:50] Intake/Output this shift:    General appearance: alert and cooperative Neurologic: intact Heart: regular rate and rhythm, S1, S2 normal, no murmur, click, rub or gallop Lungs: clear to auscultation bilaterally Extremities: edema mild Wound: incision ok  Lab Results:  Recent Labs  09/02/12 1700 09/02/12 1706 09/03/12 0400  WBC 17.0*  --  17.7*  HGB 10.5* 10.2* 10.4*  HCT 30.9* 30.0* 30.6*  PLT 197  --  200   BMET:  Recent Labs  09/02/12 0318  09/02/12 1706 09/03/12 0400  NA 138  --  136 136  K 3.9  --  4.2 3.6  CL 105  --  102 101  CO2 24  --   --  25  GLUCOSE 84  --  190* 147*  BUN 17  --  20 18  CREATININE 0.89  < > 0.90 0.78  CALCIUM 7.7*  --   --  8.0*  < > = values in this interval not displayed.  PT/INR:  Recent Labs  09/01/12 1400  LABPROT 15.4*  INR 1.24   ABG    Component Value Date/Time   PHART 7.354 09/01/2012 2140   HCO3 20.9 09/01/2012 2140   TCO2 26 09/02/2012 1706   ACIDBASEDEF 4.0* 09/01/2012 2140   O2SAT 93.0 09/01/2012 2140   CBG (last 3)   Recent Labs  09/02/12 2039 09/03/12 0011 09/03/12 0412  GLUCAP 159* 130* 134*   CXR:  Mild bibasilar atelectasis  Assessment/Plan: S/P Procedure(s) (LRB): AORTIC VALVE REPLACEMENT (AVR) (N/A) INTRAOPERATIVE TRANSESOPHAGEAL ECHOCARDIOGRAM (N/A) Hemodynamically stable. Sinus tachycardia responds to lopressor. He was on 100 mg bid preop. Will increase dose.  Preop and postop bifascicular block on ECG. Will repeat today since a couple days out from surgery and PA catheter is out. Mobilize Diuresis Diabetes control Plan for transfer to step-down: see transfer orders   LOS: 2 days    Adam [name] K 09/03/2012  ECG this am shows sinus with bifascicular block. This was present preop. He may be at higher risk for developing complete heart block although he has not had any preop or postop. Will keep VVI pacer on 50 and observe.

## 2012-09-03 NOTE — Plan of Care (Signed)
Problem: Phase III Progression Outcomes Goal: Time patient transferred to PCTU/Telemetry POD Outcome: Completed/Met Date Met:  09/03/12 1215--transferred to 2040 via wheelchair. Portable monitor and oxygen on.  No changes.

## 2012-09-03 NOTE — Progress Notes (Signed)
CARDIAC REHAB PHASE I   PRE:  Rate/Rhythm: 97 SR  BP:  Supine: 124/58  Sitting:   Standing:    SaO2: 97% 2L  MODE:  Ambulation: 300 ft   POST:  Rate/Rhythm: 101  BP:  Supine: 142/62  Sitting:   Standing:    SaO2: 89% 2L inc to 93% 2L  1328-1410 Pt tolerated ambulation fair with assist x2 and pushing rolling walker. Gait slow, steady, mult rest breaks taken. To bed after ambulation, pacer intact, VSS.  Annetta Maw

## 2012-09-04 LAB — GLUCOSE, CAPILLARY
Glucose-Capillary: 103 mg/dL — ABNORMAL HIGH (ref 70–99)
Glucose-Capillary: 118 mg/dL — ABNORMAL HIGH (ref 70–99)

## 2012-09-04 MED ORDER — INSULIN ASPART 100 UNIT/ML ~~LOC~~ SOLN
0.0000 [IU] | Freq: Three times a day (TID) | SUBCUTANEOUS | Status: DC
  Administered 2012-09-05: 2 [IU] via SUBCUTANEOUS

## 2012-09-04 MED ORDER — INSULIN ASPART 100 UNIT/ML ~~LOC~~ SOLN: 0.0000 [IU] | SUBCUTANEOUS | Status: DC

## 2012-09-04 NOTE — Progress Notes (Addendum)
[name]       [place] [address]             [phone]    3 Days Post-Op  Procedure(s) (LRB): AORTIC VALVE REPLACEMENT (AVR) (N/A) INTRAOPERATIVE TRANSESOPHAGEAL ECHOCARDIOGRAM (N/A) Subjective: Feels fine, no complaints  Objective  Telemetry SR, BBB, PVC's  Temp:  [98.2 F (36.8 C)-99.2 F (37.3 C)] 99.2 F (37.3 C) (03/16 0405) Pulse Rate:  [93-103] 95 (03/16 0405) Resp:  [20-24] 20 (03/16 0405) BP: (120-152)/(55-78) 129/57 mmHg (03/16 0405) SpO2:  [96 %-99 %] 96 % (03/16 0405) Weight:  [198 lb 8 oz (90.039 kg)] 198 lb 8 oz (90.039 kg) (03/16 0405)   Intake/Output Summary (Last 24 hours) at 09/04/12 1018 Last data filed at 09/03/12 2157  Gross per 24 hour  Intake    240 ml  Output    575 ml  Net   -335 ml       General appearance: alert, cooperative and no distress Heart: regular rate and rhythm Lungs: mildly dim in bases Abdomen: soft,nontender Extremities: no edema Wound: dressings CDI  Lab Results:  Recent Labs  09/02/12 0318 09/02/12 1700 09/02/12 1706 09/03/12 0400  NA 138  --  136 136  K 3.9  --  4.2 3.6  CL 105  --  102 101  CO2 24  --   --  25  GLUCOSE 84  --  190* 147*  BUN 17  --  20 18  CREATININE 0.89 0.90 0.90 0.78  CALCIUM 7.7*  --   --  8.0*  MG 2.5 2.2  --   --    No results found for this basename: AST, ALT, ALKPHOS, BILITOT, PROT, ALBUMIN,  in the last 72 hours No results found for this basename: LIPASE, AMYLASE,  in the last 72 hours  Recent Labs  09/02/12 1700 09/02/12 1706 09/03/12 0400  WBC 17.0*  --  17.7*  HGB 10.5* 10.2* 10.4*  HCT 30.9* 30.0* 30.6*  MCV 85.8  --  85.5  PLT 197  --  200   No results found for this basename: CKTOTAL, CKMB, TROPONINI,  in the last 72 hours No components found with this basename: POCBNP,  No results found for this basename: DDIMER,  in the last 72 hours No results found for this basename: HGBA1C,  in the last 72 hours No results found for this basename:  CHOL, HDL, LDLCALC, TRIG, CHOLHDL,  in the last 72 hours No results found for this basename: TSH, T4TOTAL, FREET3, T3FREE, THYROIDAB,  in the last 72 hours No results found for this basename: VITAMINB12, FOLATE, FERRITIN, TIBC, IRON, RETICCTPCT,  in the last 72 hours  Medications: Scheduled . aspirin EC  325 mg Oral Daily  . budesonide-formoterol  2 puff Inhalation BID  . docusate sodium  200 mg Oral Daily  . enoxaparin (LOVENOX) injection  40 mg Subcutaneous QHS  . furosemide  40 mg Oral Daily  . insulin aspart  0-24 Units Subcutaneous Q4H  . insulin aspart  4 Units Subcutaneous TID WC  . insulin detemir  30 Units Subcutaneous BID  . metFORMIN  1,000 mg Oral BID WC  . metoprolol tartrate  75 mg Oral BID  . pantoprazole  40 mg Oral QAC breakfast  . potassium chloride  40 mEq Oral Daily  . simvastatin  10 mg Oral QHS  . sodium chloride  3 mL Intravenous Q12H  . tamsulosin  0.4 mg Oral QHS  . venlafaxine  150 mg  Oral Daily  . venlafaxine  75 mg Oral QHS     Radiology/Studies:  Dg Chest Port 1 View  09/03/2012  *RADIOLOGY REPORT*  Clinical Data: Cardiac surgery  PORTABLE CHEST - 1 VIEW  Comparison: 09/02/2012  Findings: Swan-Ganz removed with the introducer left in place. Chest and mediastinal tubes removed.  Postoperative changes are noted.  Pulmonary edema improved.  Residual basilar edema is present.  Mild cardiomegaly.  No pneumothorax.  IMPRESSION: Chest tubes removed without pneumothorax.  Improving edema.   Original Report Authenticated By: Jolaine Click, M.D.     INR: Will add last result for INR, ABG once components are confirmed Will add last 4 CBG results once components are confirmed  Assessment/Plan: S/P Procedure(s) (LRB): AORTIC VALVE REPLACEMENT (AVR) (N/A) INTRAOPERATIVE TRANSESOPHAGEAL ECHOCARDIOGRAM (N/A)   1 doing well 2 sugars stable   Results for KANIN, LIA (MRN [id number]) as of 09/04/2012 10:32  Ref. Range 09/03/2012 16:05 09/03/2012 20:04 09/04/2012  00:15 09/04/2012 04:04 09/04/2012 08:06  Glucose-Capillary Latest Range: 70-99 mg/dL 308 (H) 657 (H) 96 846 (H) 103 (H)   3 Bifascicular block persists- cont backup pacer and monitor 4 gentle diuresis  5 routine rehab/pulm toilet 6 recheck labs in am  LOS: 3 days    Adam [name],Adam [name] 3/16/201410:18 AM   Chart reviewed, patient examined, agree with above. Rhythm seems stable. Diuresing well. He was on coumadin prior to cath for hx of A-flutter. I am not sure if he needs to resume this. That is up to cardiology.

## 2012-09-05 LAB — GLUCOSE, CAPILLARY
Glucose-Capillary: 212 mg/dL — ABNORMAL HIGH (ref 70–99)
Glucose-Capillary: 43 mg/dL — CL (ref 70–99)
Glucose-Capillary: 75 mg/dL (ref 70–99)
Glucose-Capillary: 116 mg/dL — ABNORMAL HIGH (ref 70–99)
Glucose-Capillary: 123 mg/dL — ABNORMAL HIGH (ref 70–99)

## 2012-09-05 LAB — CBC
HCT: 27.4 % — ABNORMAL LOW (ref 39.0–52.0)
Hemoglobin: 9.3 g/dL — ABNORMAL LOW (ref 13.0–17.0)
MCH: 28.6 pg (ref 26.0–34.0)
MCHC: 33.9 g/dL (ref 30.0–36.0)
RBC: 3.25 MIL/uL — ABNORMAL LOW (ref 4.22–5.81)

## 2012-09-05 LAB — BASIC METABOLIC PANEL
BUN: 25 mg/dL — ABNORMAL HIGH (ref 6–23)
CO2: 25 mEq/L (ref 19–32)
Chloride: 101 mEq/L (ref 96–112)
GFR calc non Af Amer: 87 mL/min — ABNORMAL LOW (ref >90)
Glucose, Bld: 58 mg/dL — ABNORMAL LOW (ref 70–99)
Potassium: 3.5 mEq/L (ref 3.5–5.1)
Sodium: 137 mEq/L (ref 135–145)

## 2012-09-05 MED ORDER — GLUCOSE 40 % PO GEL
ORAL | Status: AC
  Administered 2012-09-05: 37.5 g
  Filled 2012-09-05: qty 1

## 2012-09-05 MED ORDER — INSULIN DETEMIR 100 UNIT/ML ~~LOC~~ SOLN
15.0000 [IU] | Freq: Two times a day (BID) | SUBCUTANEOUS | Status: DC
  Administered 2012-09-05: 15 [IU] via SUBCUTANEOUS
  Filled 2012-09-05: qty 0.15
  Filled 2012-09-05: qty 10

## 2012-09-05 MED ORDER — DEXTROSE 50 % IV SOLN
INTRAVENOUS | Status: AC
  Filled 2012-09-05: qty 50

## 2012-09-05 MED ORDER — FERROUS SULFATE 325 (65 FE) MG PO TABS
325.0000 mg | ORAL_TABLET | Freq: Every day | ORAL | Status: DC
  Administered 2012-09-05 – 2012-09-06 (×2): 325 mg via ORAL
  Filled 2012-09-05 (×2): qty 1

## 2012-09-05 MED ORDER — METFORMIN HCL 500 MG PO TABS
1000.0000 mg | ORAL_TABLET | Freq: Two times a day (BID) | ORAL | Status: DC
  Administered 2012-09-06: 1000 mg via ORAL
  Filled 2012-09-05 (×4): qty 2

## 2012-09-05 MED ORDER — LOSARTAN POTASSIUM 50 MG PO TABS
50.0000 mg | ORAL_TABLET | Freq: Every day | ORAL | Status: DC
  Administered 2012-09-05 – 2012-09-06 (×2): 50 mg via ORAL
  Filled 2012-09-05 (×2): qty 1

## 2012-09-05 MED ORDER — POTASSIUM CHLORIDE CRYS ER 20 MEQ PO TBCR
40.0000 meq | EXTENDED_RELEASE_TABLET | Freq: Once | ORAL | Status: AC
  Administered 2012-09-05: 40 meq via ORAL

## 2012-09-05 MED FILL — Sodium Bicarbonate IV Soln 8.4%: INTRAVENOUS | Qty: 50 | Status: AC

## 2012-09-05 MED FILL — Heparin Sodium (Porcine) Inj 1000 Unit/ML: INTRAMUSCULAR | Qty: 10 | Status: AC

## 2012-09-05 MED FILL — Sodium Chloride Irrigation Soln 0.9%: Qty: 3000 | Status: AC

## 2012-09-05 MED FILL — Sodium Chloride IV Soln 0.9%: INTRAVENOUS | Qty: 1000 | Status: AC

## 2012-09-05 MED FILL — Mannitol IV Soln 20%: INTRAVENOUS | Qty: 500 | Status: AC

## 2012-09-05 MED FILL — Electrolyte-R (PH 7.4) Solution: INTRAVENOUS | Qty: 4000 | Status: AC

## 2012-09-05 MED FILL — Heparin Sodium (Porcine) Inj 1000 Unit/ML: INTRAMUSCULAR | Qty: 30 | Status: AC

## 2012-09-05 MED FILL — Lidocaine HCl IV Inj 20 MG/ML: INTRAVENOUS | Qty: 5 | Status: AC

## 2012-09-05 NOTE — Progress Notes (Signed)
CARDIAC REHAB PHASE I   PRE:  Rate/Rhythm: 103 ST  BP:  Supine: 144/60  Sitting:   Standing:    SaO2: 95A  MODE:  Ambulation: 200  Ft to bathroom then 400 feet  POST:  Rate/Rhythm: 120  BP:  Supine:   Sitting: 160/70  Standing:    SaO2: 86 RA  90 RA after rest [id number] On arrival pt in bed. He was a little slow to respond to request to start getting out of bed. Assisted X 1 and used walker to ambulate. Pt walked first time 200 feet then requested that he had to go to bathroom. Assisted pt back to room then he walked 400 feet. Pt is DOE and tired by end of walk. When questioned states that he felt SOB bu end of 400 foot walk. RA sat after walk 86%, rechecked RA sat with rest increased to 90%. Pt to recliner after walk with call light in reach and chair alarm placed in chair.  Melina Copa RN 09/05/2012 9:51 AM

## 2012-09-05 NOTE — Progress Notes (Addendum)
                   [address]            [place] [address]          [phone]      4 Days Post-Op Procedure(s) (LRB): AORTIC VALVE REPLACEMENT (AVR) (N/A) INTRAOPERATIVE TRANSESOPHAGEAL ECHOCARDIOGRAM (N/A)  Subjective: Patient hypoglycemic earlier this am. Up until this, has been feeling fairly well.  Objective: Vital signs in last 24 hours: Temp:  [98.3 F (36.8 C)-98.5 F (36.9 C)] 98.5 F (36.9 C) (03/17 0639) Pulse Rate:  [94-102] 96 (03/17 0639) Cardiac Rhythm:  [-] Normal sinus rhythm (03/16 0830) Resp:  [19-20] 20 (03/17 0639) BP: (125-144)/(59-66) 144/60 mmHg (03/17 0639) SpO2:  [91 %-93 %] 91 % (03/17 0639) Weight:  [88.451 kg (195 lb)] 88.451 kg (195 lb) (03/17 0639)  Pre op weight  88.5 kg Current Weight  09/05/12 88.451 kg (195 lb)       Intake/Output from previous day: 03/16 0701 - 03/17 0700 In: 480 [P.O.:480] Out: 825 [Urine:825]   Physical Exam:  Cardiovascular: RRR, no murmurs, gallops, or rubs. Pulmonary: Clear to auscultation bilaterally; no rales, wheezes, or rhonchi. Abdomen: Soft, non tender, bowel sounds present. Extremities: Trace lower extremity edema. Wounds: Clean and dry.  No erythema or signs of infection.  Lab Results: CBC: Recent Labs  09/03/12 0400 09/05/12 0500  WBC 17.7* 13.4*  HGB 10.4* 9.3*  HCT 30.6* 27.4*  PLT 200 274   BMET:  Recent Labs  09/03/12 0400 09/05/12 0500  NA 136 137  K 3.6 3.5  CL 101 101  CO2 25 25  GLUCOSE 147* 58*  BUN 18 25*  CREATININE 0.78 0.76  CALCIUM 8.0* 8.2*    PT/INR:  Lab Results  Component Value Date   INR 1.24 09/01/2012   INR 0.96 08/31/2012   INR 1.01 08/24/2012   ABG:  INR: Will add last result for INR, ABG once components are confirmed Will add last 4 CBG results once components are confirmed  Assessment/Plan:  1. CV - Post op bifascicular block. Has been maintaining SR. On Lopressor 75 bid. SBP in the 140's. Will restart low dose Cozaar. 2.   Pulmonary - Encourage incentive spirometer 3. Volume Overload - Continue Lasix 40 daily 4.  Acute blood loss anemia - H and H decreased to 9.3 and 27.4. Start Ferrous 5.Supplement potassium 6.DM-CBGs 43/40/49/75. On Insulin 4 units tid, Metformin 1000 bid, and Insulin 30 bid. Will stop Insulin tid and decrease bid dosing. Hold Metformin this morning 7.Remove EPW am 8.Possible discharge in am   ZIMMERMAN,DONIELLE MPA-C 09/05/2012,7:58 AM   Chart reviewed, patient examined, agree with above. Will need to check with Dr. Excell Seltzer about resuming coumadin. He had been on this for A-fib and A-flutter in past. He has had no signs of this postop.

## 2012-09-06 LAB — GLUCOSE, CAPILLARY
Glucose-Capillary: 112 mg/dL — ABNORMAL HIGH (ref 70–99)
Glucose-Capillary: 115 mg/dL — ABNORMAL HIGH (ref 70–99)

## 2012-09-06 MED ORDER — LOSARTAN POTASSIUM 50 MG PO TABS: 50.0000 mg | ORAL_TABLET | Freq: Every day | ORAL | Status: DC

## 2012-09-06 MED ORDER — FERROUS SULFATE 325 (65 FE) MG PO TABS: 325.0000 mg | ORAL_TABLET | Freq: Every day | ORAL | Status: DC

## 2012-09-06 MED ORDER — METOPROLOL TARTRATE 50 MG PO TABS: 75.0000 mg | ORAL_TABLET | Freq: Two times a day (BID) | ORAL | Status: DC

## 2012-09-06 MED ORDER — TRAMADOL HCL 50 MG PO TABS: 50.0000 mg | ORAL_TABLET | Freq: Four times a day (QID) | ORAL | Status: DC | PRN

## 2012-09-06 MED ORDER — ASPIRIN 325 MG PO TBEC: 325.0000 mg | DELAYED_RELEASE_TABLET | Freq: Every day | ORAL | Status: DC

## 2012-09-06 NOTE — Progress Notes (Signed)
09/06/2012 0840 Nursing note EPW and CTS d/c per orders and per protocol. Ends intact. Vital signs collected per protocol. Benzoin and steri strips applied to cts sites. Pt. Advised bedrest for one hour post removal. Call bell within reach. Will continue to monitor patient.  Edelin Fryer, Blanchard Kelch

## 2012-09-06 NOTE — Discharge Summary (Signed)
Physician Discharge Summary  Patient ID: Adam [name] MRN: [id number] DOB/AGE: [birth year] 76 y.o.  Admit date: 09/01/2012 Discharge date: 09/06/2012  Admission Diagnoses: 1.Severe aortic stenosis 2.History of CAD (s/p CABG x 5 in 03') 3.History of hypertension 4.Hisotry of hyperlipidemia 5.History of DM type II 6.Histroy of afib/aflutter (s/p CTI ablation 12') 7.History of bifascicular block 8.History of DDD/DJD  Discharge Diagnoses:  1.Severe aortic stenosis 2.History of CAD (s/p CABG x 5 in 03') 3.History of hypertension 4.Hisotry of hyperlipidemia 5.History of DM type II 6.Histroy of afib/aflutter (s/p CTI ablation 12') 7.History of bifascicular block 8.History of DDD/DJD 9.ABL anemia   Procedure (s):  Redo median sternotomy, extracorporeal  circulation, aortic valve replacement using a 21-mm Edwards pericardial Magna-Ease valve by Dr. Laneta Simmers on 09/02/2012.   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 He had a left internal mammary graft to the LAD with a saphenous vein graft to the intermediate coronary, a sequential saphenous vein graft to the posterior descending branch of the right coronary and the posterior lateral branch of the left circumflex coronary, and a saphenous vein graft to the obtuse marginal coronary artery. He has done well until the past 6 months when he is noted slowly progressive shortness of breath. He does note it has gotten to the point where he can't do much activity without getting some shortness of breath. He has occasional episodes of chest pressure. A recent echocardiogram showed severe aortic stenosis with a peak velocity of 4.19 meters per second with a peak gradient of 70 mm mercury and a mean gradient of 41 mm mercury. His last echocardiogram prior to this was in October of 2013 and his peak velocity at that time was 4.42 m/s with a mean gradient  of 36 mm mercury. Cardiac catheterization was performed recently and showed normal right and left heart pressures. The mean gradient across the aortic valve was measured at 22 with an aortic valve area of 0.9 cm . Coronary angiography showed the left circumflex to be totally occluded at its ostium. The right coronary was totally occluded proximally. The LAD was diffusely diseased proximally and totally occluded before the first septal perforator. The left internal mammary graft to the LAD was widely patent. The vein graft to the intermediate was patent. The vein graft to the obtuse marginal was diffusely diseased with a 80% stenosis in the midportion with severe distal graft stenosis. The obtuse marginal vessel appeared relatively small with some distal disease in it. The saphenous vein graft to the posterior descending and posterior lateral branches was patent with some mild proximal irregularity most likely due to a valve. The saphenous vein graft to the acute marginal is widely patent. Left ventricular ejection fraction of 65-70% with no mitral regurgitation. Aortic root angiography showed the proximal aorta to be of normal size with some calcification of the mid descending aorta posteriorly as well as some calcified plaque in the aortic root. The aortic valve was heavily calcified with poor leaflet motion. Abdominal aortography showed 80% ostial stenosis of the right internal iliac artery. The right external iliac artery was severely diseased with 99% calcified eccentric stenosis. The left external iliac artery had severe diffuse 80% stenosis with a patent internal iliac artery on that side.      He has severe aortic stenosis with progressive exertional dyspnea. His old operative note was reviewed by Dr. Laneta Simmers and it was noted at the time of  surgery that the obtuse marginal branch was only visible proximally and then became intramyocardial throughout its remaining extent. I doubt that it would be suitable for  grafting under redo circumstances. I told him that I could evaluate that further in the operating room. I think aortic valve replacement is indicated to prevent worsening symptoms and left ventricular dysfunction. I would plan to use a tissue valve given his age. He had been on Coumadin for atrial flutter but it was felt a  tissue valve would be the best option for him at his age (78). Potential risks, benefits, and complications of a redo sternotomy for AVR were discussed and he agreed to proceed. Pre operative carotid US showed no significant left internal carotid artery stenosis and a 40-59% right internal carotid artery stenosis. ABI's were 0.52 on the right and 0.64 on the left.He underwent a redo sternotomy for AVR on 09/02/2012.  Brief Hospital Course:  He was extubated without difficulty the evening of surgery. He remained afebrile and hemodynamically stable. He initially required VVI pacing. He had a bifascicular block pre op. His Theone Murdoch, a line, chest tubes, and foley were removed early in his post operative course. He was started on low dose Lopressor. He then had sinus tachycardia so his Lopressor was increased. He was volume overloaded and diuresed accordingly. He was weaned off his insulin drip and restarted on Metformin and insulin. He was felt surgically stable for transfer from the ICU to PCTU for further convalescence on 09/03/2012. He then had several  Hypoglycemic episodes. According to the nurse, he has not been eating a lot. He has no specific complaints (ie no abdominal pain, nausea, or emesis) and has had bowel movements. He is ambulating on room air, but does desat into the mid 80's with ambulation. We will arrange home oxygen and a home health nurse. His epicardial pacing wires and chest tube sutures will be removed today. He is felt surgically stable fordischarge today.   Latest Vital Signs: Blood pressure 127/64, pulse 80, temperature 98.1 F (36.7 C), temperature source Oral, resp.  rate 20, height 5\' 4"  (1.626 m), weight 87 kg (191 lb 12.8 oz), SpO2 91.00%.  Physical Exam: Cardiovascular: RRR, no murmurs, gallops, or rubs.  Pulmonary: Clear to auscultation bilaterally; no rales, wheezes, or rhonchi.  Abdomen: Soft, non tender, bowel sounds present.  Extremities: Trace lower extremity edema.  Neurologic: Grossly intact without focal deficits  Wounds: Clean and dry. No erythema or signs of infection.   Discharge Condition:Stable  Recent laboratory studies:  Lab Results  Component Value Date   WBC 13.4* 09/05/2012   HGB 9.3* 09/05/2012   HCT 27.4* 09/05/2012   MCV 84.3 09/05/2012   PLT 274 09/05/2012   Lab Results  Component Value Date   NA 137 09/05/2012   K 3.5 09/05/2012   CL 101 09/05/2012   CO2 25 09/05/2012   CREATININE 0.76 09/05/2012   GLUCOSE 58* 09/05/2012      Diagnostic Studies:    Dg Chest Port 1 View  09/03/2012  *RADIOLOGY REPORT*  Clinical Data: Cardiac surgery  PORTABLE CHEST - 1 VIEW  Comparison: 09/02/2012  Findings: Swan-Ganz removed with the introducer left in place. Chest and mediastinal tubes removed.  Postoperative changes are noted.  Pulmonary edema improved.  Residual basilar edema is present.  Mild cardiomegaly.  No pneumothorax.  IMPRESSION: Chest tubes removed without pneumothorax.  Improving edema.   Original Report Authenticated By: Jolaine Click, M.D.      Future Appointments Provider Department  Dept Phone   09/26/2012 11:00 AM [facility] [phone]   09/26/2012 11:15 AM [name], MD [facility] Main Office [place]) [phone]   09/27/2012 1:30 PM Alleen Borne, MD [facility] [phone]   12/07/2012 1:30 PM Barbaraann Share, MD [facility] [phone]      Discharge Medications:   Medication List    STOP taking these medications       hydrochlorothiazide 25 MG tablet  Commonly known as:  HYDRODIURIL      HYDROcodone-acetaminophen 5-325 MG per tablet  Commonly known as:  NORCO/VICODIN     potassium chloride 10 MEQ tablet  Commonly known as:  K-DUR,KLOR-CON      TAKE these medications       albuterol 108 (90 BASE) MCG/ACT inhaler  Commonly known as:  PROVENTIL HFA;VENTOLIN HFA  Inhale 2 puffs into the lungs every 6 (six) hours as needed for wheezing.     aspirin 325 MG EC tablet  Take 1 tablet (325 mg total) by mouth daily.     budesonide-formoterol 160-4.5 MCG/ACT inhaler  Commonly known as:  SYMBICORT  Inhale 2 puffs into the lungs 2 (two) times daily.     carisoprodol 350 MG tablet  Commonly known as:  SOMA  Take 350 mg by mouth 4 (four) times daily as needed for muscle spasms.     cilostazol 50 MG tablet  Commonly known as:  PLETAL  Take 1 tablet by mouth Twice daily.     ferrous sulfate 325 (65 FE) MG tablet  Take 1 tablet (325 mg total) by mouth daily with lunch. For one month then stop.     HUMALOG 100 UNIT/ML injection  Generic drug:  insulin lispro  Inject 34 Units into the skin 2 (two) times daily.     HUMULIN N 100 UNIT/ML injection  Generic drug:  insulin NPH  Inject 24 Units into the skin 3 (three) times daily.     LORazepam 1 MG tablet  Commonly known as:  ATIVAN  Take 1-2 mg by mouth at bedtime as needed.     losartan 50 MG tablet  Commonly known as:  COZAAR  Take 1 tablet (50 mg total) by mouth daily.     metFORMIN 500 MG 24 hr tablet  Commonly known as:  GLUCOPHAGE-XR  Take 1,000 mg by mouth at bedtime.     metoprolol 50 MG tablet  Commonly known as:  LOPRESSOR  Take 1.5 tablets (75 mg total) by mouth 2 (two) times daily.     simvastatin 10 MG tablet  Commonly known as:  ZOCOR  Take 1 tablet (10 mg total) by mouth at bedtime.     tamsulosin 0.4 MG Caps  Commonly known as:  FLOMAX  Take 0.4 mg by mouth at bedtime.     traMADol 50 MG tablet  Commonly known as:  ULTRAM  Take 1-2 tablets (50-100 mg total) by mouth every 6 (six) hours as  needed for pain.     venlafaxine 75 MG tablet  Commonly known as:  EFFEXOR  Take 150mg  in the morning and 75mg  at bedtime.     warfarin 5 MG tablet  Commonly known as:  COUMADIN  Take 5-10 mg by mouth See admin instructions. Takes coumadin 10 mg each day except on Monday and Friday takes 5 mg     zolpidem 5 MG tablet  Commonly known as:  AMBIEN  Take 5-10 mg by mouth at  bedtime as needed for sleep.        Follow Up Appointments:     Follow-up Information   Follow up with Alleen Borne, MD In 3 weeks. (Office will mail your appointment)    Contact information:   [address] [place] [address] [phone]       Follow up with [facility] In 3 weeks. (Please get CXR 1 hour prior to your appointment with Dr. Laneta Simmers)    Contact information:   [place]       Schedule an appointment as soon as possible for a visit with Tonny Bollman, MD. (Please contact office and set up follow up for 2-4 weeks)    Contact information:   [address] [place] [address] [phone]       Follow up with Garlan Fillers, MD. (Call for a follow up appt for further diabetes management)    Contact information:   [address] [facility] [place] [address] [phone]       Follow up with [facility]. (Call for a PT and INR appointment for Thursday 09/08/2012)    Contact information:   [address] [place] [address] [phone]      Signed: Doree Fudge MPA-C 09/06/2012, 9:08 AM

## 2012-09-06 NOTE — Progress Notes (Signed)
                   [address]            [place] [address]          [phone]      5 Days Post-Op Procedure(s) (LRB): AORTIC VALVE REPLACEMENT (AVR) (N/A) INTRAOPERATIVE TRANSESOPHAGEAL ECHOCARDIOGRAM (N/A)  Subjective: Nurse reports this morning that patient has had some intermittent confusion in the late evening. Patient is alert, oriented this am. He has no complaints and hopes to go home.  Objective: Vital signs in last 24 hours: Temp:  [98.1 F (36.7 C)-98.8 F (37.1 C)] 98.1 F (36.7 C) (03/18 0637) Pulse Rate:  [74-105] 80 (03/18 0637) Cardiac Rhythm:  [-] Normal sinus rhythm;Heart block (03/17 1935) Resp:  [20-21] 20 (03/18 0637) BP: (122-150)/(61-72) 127/64 mmHg (03/18 0637) SpO2:  [91 %-93 %] 91 % (03/18 0637) Weight:  [87 kg (191 lb 12.8 oz)] 87 kg (191 lb 12.8 oz) (03/18 0637)  Pre op weight  88.5 kg Current Weight  09/06/12 87 kg (191 lb 12.8 oz)     Intake/Output from previous day: 03/17 0701 - 03/18 0700 In: 240 [P.O.:240] Out: 200 [Urine:200]   Physical Exam:  Cardiovascular: RRR, no murmurs, gallops, or rubs. Pulmonary: Clear to auscultation bilaterally; no rales, wheezes, or rhonchi. Abdomen: Soft, non tender, bowel sounds present. Extremities: Trace lower extremity edema. Neurologic: Grossly intact without focal deficits Wounds: Clean and dry.  No erythema or signs of infection.  Lab Results: CBC:  Recent Labs  09/05/12 0500  WBC 13.4*  HGB 9.3*  HCT 27.4*  PLT 274   BMET:   Recent Labs  09/05/12 0500  NA 137  K 3.5  CL 101  CO2 25  GLUCOSE 58*  BUN 25*  CREATININE 0.76  CALCIUM 8.2*    PT/INR:  Lab Results  Component Value Date   INR 1.24 09/01/2012   INR 0.96 08/31/2012   INR 1.01 08/24/2012   ABG:  INR: Will add last result for INR, ABG once components are confirmed Will add last 4 CBG results once components are confirmed  Assessment/Plan:  1. CV - Post op bifascicular block. Has been  maintaining SR. On Lopressor 75 bid and Cozaar 5 daily. Per cardiology, will restart Coumadin (has a history of afib/flutter) 2.  Pulmonary - Encourage incentive spirometer 3. Volume Overload - Continue Lasix 40 daily 4.  Acute blood loss anemia - H and H decreased to 9.3 and 27.4. Continue Ferrous 6.DM-CBGs 123/132/112. No further hypoglycemia. Will restart pre diabetes management upon discharge. 7.Remove EPW and CT sutures 8.Regarding intermittent night time confusion, questionable underlying dementia. 9.Discharge today   Adam [name] MPA-C 09/06/2012,7:43 AM

## 2012-09-06 NOTE — Progress Notes (Addendum)
09/06/2012 1:02 PM Nursing note Discharge avs form, medications already taken today and those due this evening given and explained to patient and family. Follow up appointments, when to call MD and home health arrangements reviewed. Incision site care, activity restrictions and use of home oxygen explained. Pt. And family already viewed video #113. INR checks reviewed. Pt. And family verbalized understanding of instructions. Questions and concerns addressed. D/c iv. D/c tele. Adam [name], Blanchard Kelch 3:06 PM Home oxygen tank delivered to patient prior to discharge. Patency of oxygen tubing checked and placed on patient prior to discharge. oxygen saturation 92 % on 2L. Questions and concerns addressed once more prior to discharge.  D/c home with family per orders.

## 2012-09-06 NOTE — Progress Notes (Signed)
CARDIAC REHAB PHASE I   PRE:  Rate/Rhythm: 85 SR  BP:  Supine:   Sitting: 130/60  Standing:    SaO2: 94 2L 87 RA  MODE:  Ambulation: 450 ft   POST:  Rate/Rhythm: 92  BP:  Supine:   Sitting: 136/60  Standing:    SaO2: 80 RA during walk O2 2L 94%  After walk on 2L 89 [id number] On arrival pt on O2 2L sat 94%, oxygen discontinued 87%. Assisted X 1 and used walker to ambulate. Gait steady with walker.  After waking approx 100 feet RA sat 80%. Oxygen applied 2L and had pt to take standing rest stop, sat improved to 94%. Pt walked rest of walk on 2L sat after walk 89%. Completed discharge education with pt, wife and daughter. They voice understanding. Pt agrees to [facility]. [facility] in [place], will send referral.  Melina Copa RN 09/06/2012 11:42 AM

## 2012-09-07 LAB — GLUCOSE, CAPILLARY: Glucose-Capillary: 41 mg/dL — CL (ref 70–99)

## 2012-09-08 ENCOUNTER — Ambulatory Visit (INDEPENDENT_AMBULATORY_CARE_PROVIDER_SITE_OTHER): Payer: Medicare Other | Admitting: Cardiology

## 2012-09-08 DIAGNOSIS — I359 Nonrheumatic aortic valve disorder, unspecified: Secondary | ICD-10-CM

## 2012-09-08 DIAGNOSIS — I350 Nonrheumatic aortic (valve) stenosis: Secondary | ICD-10-CM

## 2012-09-08 DIAGNOSIS — Z7901 Long term (current) use of anticoagulants: Secondary | ICD-10-CM | POA: Insufficient documentation

## 2012-09-08 DIAGNOSIS — Z954 Presence of other heart-valve replacement: Secondary | ICD-10-CM

## 2012-09-08 DIAGNOSIS — Z952 Presence of prosthetic heart valve: Secondary | ICD-10-CM | POA: Insufficient documentation

## 2012-09-08 DIAGNOSIS — I4891 Unspecified atrial fibrillation: Secondary | ICD-10-CM

## 2012-09-12 ENCOUNTER — Ambulatory Visit (INDEPENDENT_AMBULATORY_CARE_PROVIDER_SITE_OTHER): Payer: Medicare Other | Admitting: Internal Medicine

## 2012-09-12 DIAGNOSIS — Z7901 Long term (current) use of anticoagulants: Secondary | ICD-10-CM

## 2012-09-12 DIAGNOSIS — Z954 Presence of other heart-valve replacement: Secondary | ICD-10-CM

## 2012-09-12 DIAGNOSIS — I359 Nonrheumatic aortic valve disorder, unspecified: Secondary | ICD-10-CM

## 2012-09-12 DIAGNOSIS — I4891 Unspecified atrial fibrillation: Secondary | ICD-10-CM

## 2012-09-12 DIAGNOSIS — I350 Nonrheumatic aortic (valve) stenosis: Secondary | ICD-10-CM

## 2012-09-12 LAB — POCT INR: INR: 3.5

## 2012-09-19 ENCOUNTER — Ambulatory Visit (INDEPENDENT_AMBULATORY_CARE_PROVIDER_SITE_OTHER): Payer: Medicare Other | Admitting: Cardiology

## 2012-09-19 ENCOUNTER — Ambulatory Visit
Admission: RE | Admit: 2012-09-19 | Discharge: 2012-09-19 | Disposition: A | Discharge status: Home / Self Care | Payer: Medicare Other | Source: Ambulatory Visit | Attending: Cardiovascular Disease | Admitting: Cardiovascular Disease

## 2012-09-19 ENCOUNTER — Encounter: Payer: Self-pay | Admitting: Cardiovascular Disease

## 2012-09-19 ENCOUNTER — Telehealth: Payer: Self-pay | Admitting: Cardiovascular Disease

## 2012-09-19 ENCOUNTER — Ambulatory Visit (INDEPENDENT_AMBULATORY_CARE_PROVIDER_SITE_OTHER): Payer: Medicare Other | Admitting: Cardiovascular Disease

## 2012-09-19 VITALS — BP 96/61 | HR 89 | Ht 64.0 in | Wt 188.0 lb

## 2012-09-19 DIAGNOSIS — I350 Nonrheumatic aortic (valve) stenosis: Secondary | ICD-10-CM

## 2012-09-19 DIAGNOSIS — I4891 Unspecified atrial fibrillation: Secondary | ICD-10-CM

## 2012-09-19 DIAGNOSIS — R0602 Shortness of breath: Secondary | ICD-10-CM

## 2012-09-19 DIAGNOSIS — I359 Nonrheumatic aortic valve disorder, unspecified: Secondary | ICD-10-CM

## 2012-09-19 DIAGNOSIS — Z954 Presence of other heart-valve replacement: Secondary | ICD-10-CM

## 2012-09-19 DIAGNOSIS — Z7901 Long term (current) use of anticoagulants: Secondary | ICD-10-CM

## 2012-09-19 LAB — POCT INR: INR: 3.2

## 2012-09-19 NOTE — Patient Instructions (Addendum)
Your physician recommends that you schedule a follow-up appointment in: 4-6 WEEKS with Dr Johney Frame  Your physician recommends that you continue on your current medications as directed. Please refer to the Current Medication list given to you today.  Your physician has requested that you have an echocardiogram in 4-6 WEEKS. Echocardiography is a painless test that uses sound waves to create images of your heart. It provides your doctor with information about the size and shape of your heart and how well your heart's chambers and valves are working. This procedure takes approximately one hour. There are no restrictions for this procedure.

## 2012-09-19 NOTE — Telephone Encounter (Signed)
New problem   Pt is experiences rt arm pain/SOB/fatigue and increase cough since hospital discharge on 09/07/12 pt had valve replacement. Pt's daughter would like for pt to be be seen this week. Please call pt's daughter concerning this matter.

## 2012-09-19 NOTE — Telephone Encounter (Signed)
I spoke with the pt's daughter and she states the pt's SOB and fatigue are worsening since discharge from the hospital.  The pt is also having right arm pain that has been constant for 2 weeks. The pt is coughing up yellowish mucus but denies fever. We will have the pt get a chest x-ray today and be seen in the office by Dr Excell Seltzer.

## 2012-09-19 NOTE — Progress Notes (Signed)
HPI:  78 year old gentleman being seen as an add-on today after calling in with shortness of breath. He recently underwent redo open heart surgery after developing severe symptomatic aortic stenosis. He has previously undergone multivessel CABG. On March 14, he underwent aortic valve replacement with a 21 mm Edwards pericardial Magna-Ease valve. Left ventricular function was preserved with an LVEF of 65-70%. We ordered a chest x-ray before his visit here today.  The patient continues to complain of shortness of breath, similar to his preoperative symptoms. He does not have chest pain or pressure. He has no orthopnea, PND, or leg swelling. He continues to have a cough with some yellow sputum. He denies fever or chills. He was concerned about the cough and shortness of breath and called the office today.  Outpatient Encounter Prescriptions as of 09/19/2012  Medication Sig Dispense Refill  . albuterol (PROVENTIL HFA;VENTOLIN HFA) 108 (90 BASE) MCG/ACT inhaler Inhale 2 puffs into the lungs every 6 (six) hours as needed for wheezing.      [name] aspirin EC 325 MG EC tablet Take 1 tablet (325 mg total) by mouth daily.  30 tablet    . budesonide-formoterol (SYMBICORT) 160-4.5 MCG/ACT inhaler Inhale 2 puffs into the lungs 2 (two) times daily.      . carisoprodol (SOMA) 350 MG tablet Take 350 mg by mouth 4 (four) times daily as needed for muscle spasms.       . cilostazol (PLETAL) 50 MG tablet Take 1 tablet by mouth Twice daily.      . ferrous sulfate 325 (65 FE) MG tablet Take 1 tablet (325 mg total) by mouth daily with lunch. For one month then stop.      [name] HUMALOG 100 UNIT/ML injection Inject 30 Units into the skin 2 (two) times daily.       [name] HUMULIN N 100 UNIT/ML injection Inject 20 Units into the skin 3 (three) times daily.       [name] LORazepam (ATIVAN) 1 MG tablet Take 1-2 mg by mouth at bedtime as needed.        [name] losartan (COZAAR) 50 MG tablet Take 1 tablet (50 mg total) by mouth daily.  30 tablet  1  .  metFORMIN (GLUCOPHAGE-XR) 500 MG 24 hr tablet Take 1,000 mg by mouth at bedtime.        . metoprolol (LOPRESSOR) 50 MG tablet Take 1.5 tablets (75 mg total) by mouth 2 (two) times daily.  90 tablet  1  . simvastatin (ZOCOR) 10 MG tablet Take 1 tablet (10 mg total) by mouth at bedtime.  90 tablet  3  . Tamsulosin HCl (FLOMAX) 0.4 MG CAPS Take 0.4 mg by mouth at bedtime.       . traMADol (ULTRAM) 50 MG tablet Take 1-2 tablets (50-100 mg total) by mouth every 6 (six) hours as needed for pain.  40 tablet  0  . venlafaxine (EFFEXOR) 75 MG tablet Take 150mg  in the morning and 75mg  at bedtime.      [name] warfarin (COUMADIN) 5 MG tablet Take 5-10 mg by mouth See admin instructions. Takes coumadin 10 mg each day except on Monday and Friday takes 5 mg      . zolpidem (AMBIEN) 5 MG tablet Take 5-10 mg by mouth at bedtime as needed for sleep.        No facility-administered encounter medications on file as of 09/19/2012.    Allergies  Allergen Reactions  . Doxycycline     REACTION: swollen tongue  Past Medical History  Diagnosis Date  . Hypertension   . Hyperlipidemia   . Type II or unspecified type diabetes mellitus without mention of complication, not stated as uncontrolled   . Nephrolithiasis   . Atrial flutter     s/p CTI ablation by Dr Ladona Ridgel 1/12  . CAD (coronary artery disease)     s/p CABG 2003 by Dr Laneta Simmers  . Aortic stenosis, moderate     moderate to severe per echo 03/2012  . DJD (degenerative joint disease)   . DDD (degenerative disc disease)   . Spinal stenosis of lumbar region   . Bifascicular block   . Atrial tachycardia   . S/P CABG x 5 08/19/2012    CABGx5 by Dr Laneta Simmers: LIMA to LAD, SVG to OM, sequential SVG to PDA and LPLB, SVG to AM, EVH and open vein harvest from right thigh and leg  . Shortness of breath   . Anxiety   . Depression     ROS: Negative except as per HPI  BP 96/61  Pulse 89  Ht 5\' 4"  (1.626 m)  Wt 188 lb (85.276 kg)  BMI 32.25 kg/m2  SpO2  99%  PHYSICAL EXAM: Pt is alert and oriented, pleasant elderly male in NAD HEENT: normal Neck: JVP - normal Lungs: CTA bilaterally CV: RRR with a soft systolic ejection murmur at the right upper sternal border Abd: soft, NT, Positive BS, no hepatomegaly Ext: no C/C/E Skin: warm/dry no rash  EKG:  Normal sinus rhythm with right bundle branch block and left anterior fascicular block, first degree AV block is present. There is no significant change from his previous tracing.  Chest x-ray: Findings: Bibasilar fibrosis appears stable. No focal infiltrate or effusion is seen. Mediastinal contours are stable. Cardiomegaly is unchanged and an aortic valve replacement is noted.  IMPRESSION: No active lung disease. Stable cardiomegaly and basilar fibrosis.  ASSESSMENT AND PLAN: 1. Severe aortic stenosis, now status post bioprosthetic aortic valve replacement. The patient's cardiac exam suggests normal valve function. Will check an echocardiogram before his followup appointment with Dr. Johney Frame.  2. Coronary artery disease status post remote CABG. Stable without ischemic symptoms.  3. Paroxysmal atrial fibrillation. He is maintaining sinus rhythm. He is anticoagulated with warfarin.  4. Shortness of breath. Suspect this is multifactorial. His chest x-ray is clear. Overall I think he is actually progressing quite well after recent cardiac surgery. He was reassured today. He certainly exhibits no signs of volume overload and his weight is lower than his preoperative weight. He will continue his current medical program.    5. Lower extremity peripheral arterial disease with intermittent claudication. The patient has very severe external iliac disease on the right. As he recovers from heart surgery and begins cardiac rehabilitation, will consider intervention if he is significantly limited. For now continue observation and medical treatment.  For followup, he sees Dr. Laneta Simmers next week. I will move  his appointment now with Dr. Johney Frame for about 4-6 weeks.  Tonny Bollman 09/19/2012 2:40 PM

## 2012-09-22 ENCOUNTER — Telehealth: Payer: Self-pay | Admitting: Internal Medicine

## 2012-09-22 NOTE — Telephone Encounter (Signed)
New Prob    Pt doing very well, will see Dr. Johney Frame on Wednesday and will ask about outpatient cardiac rehab.

## 2012-09-23 ENCOUNTER — Other Ambulatory Visit: Payer: Self-pay | Admitting: *Deleted

## 2012-09-23 DIAGNOSIS — I350 Nonrheumatic aortic (valve) stenosis: Secondary | ICD-10-CM

## 2012-09-26 ENCOUNTER — Ambulatory Visit: Payer: Medicare Other | Admitting: Internal Medicine

## 2012-09-27 ENCOUNTER — Ambulatory Visit (INDEPENDENT_AMBULATORY_CARE_PROVIDER_SITE_OTHER): Payer: Medicare Other | Admitting: Cardiovascular Disease

## 2012-09-27 ENCOUNTER — Ambulatory Visit: Payer: Medicare Other | Admitting: Surgery

## 2012-09-27 DIAGNOSIS — Z7901 Long term (current) use of anticoagulants: Secondary | ICD-10-CM

## 2012-09-27 DIAGNOSIS — I359 Nonrheumatic aortic valve disorder, unspecified: Secondary | ICD-10-CM

## 2012-09-27 DIAGNOSIS — I4891 Unspecified atrial fibrillation: Secondary | ICD-10-CM

## 2012-09-27 DIAGNOSIS — Z954 Presence of other heart-valve replacement: Secondary | ICD-10-CM

## 2012-09-27 DIAGNOSIS — I350 Nonrheumatic aortic (valve) stenosis: Secondary | ICD-10-CM

## 2012-09-28 ENCOUNTER — Ambulatory Visit
Admission: RE | Admit: 2012-09-28 | Discharge: 2012-09-28 | Disposition: A | Discharge status: Home / Self Care | Payer: Medicare Other | Source: Ambulatory Visit | Attending: Surgery | Admitting: Surgery

## 2012-09-28 ENCOUNTER — Ambulatory Visit (INDEPENDENT_AMBULATORY_CARE_PROVIDER_SITE_OTHER): Payer: Self-pay | Admitting: Surgery

## 2012-09-28 ENCOUNTER — Encounter: Payer: Self-pay | Admitting: Surgery

## 2012-09-28 VITALS — BP 92/56 | HR 93 | Resp 18 | Ht 64.0 in | Wt 183.0 lb

## 2012-09-28 DIAGNOSIS — I350 Nonrheumatic aortic (valve) stenosis: Secondary | ICD-10-CM

## 2012-09-28 DIAGNOSIS — Z954 Presence of other heart-valve replacement: Secondary | ICD-10-CM

## 2012-09-28 DIAGNOSIS — I359 Nonrheumatic aortic valve disorder, unspecified: Secondary | ICD-10-CM

## 2012-09-28 DIAGNOSIS — Z952 Presence of prosthetic heart valve: Secondary | ICD-10-CM

## 2012-09-28 NOTE — Progress Notes (Signed)
[address]       [place] [address]             [phone]      HPI:  Patient returns for routine postoperative follow-up having undergone redo median sternotomy for aortic valve replacement using a 21 mm Edwards pericardial valve on 09/01/2012. The patient's early postoperative recovery while in the hospital was notable for an uncomplicated postoperative course. He was discharged on oxygen due to saturations in the mid 80s when weaned off oxygen. Since hospital discharge the patient reports that he has been walking short distances without chest pain or shortness of breath. He said that home health nursing has not tried to wean off the oxygen yet.   Current Outpatient Prescriptions  Medication Sig Dispense Refill  . albuterol (PROVENTIL HFA;VENTOLIN HFA) 108 (90 BASE) MCG/ACT inhaler Inhale 2 puffs into the lungs every 6 (six) hours as needed for wheezing.      [name] aspirin EC 325 MG EC tablet Take 1 tablet (325 mg total) by mouth daily.  30 tablet    . budesonide-formoterol (SYMBICORT) 160-4.5 MCG/ACT inhaler Inhale 2 puffs into the lungs 2 (two) times daily.      . carisoprodol (SOMA) 350 MG tablet Take 350 mg by mouth 4 (four) times daily as needed for muscle spasms.       . cilostazol (PLETAL) 50 MG tablet Take 1 tablet by mouth Twice daily.      . ferrous sulfate 325 (65 FE) MG tablet Take 1 tablet (325 mg total) by mouth daily with lunch. For one month then stop.      [name] HUMALOG 100 UNIT/ML injection Inject 30 Units into the skin 2 (two) times daily.       [name] HUMULIN N 100 UNIT/ML injection Inject 20 Units into the skin 3 (three) times daily.       [name] LORazepam (ATIVAN) 1 MG tablet Take 1-2 mg by mouth at bedtime as needed.        [name] losartan (COZAAR) 50 MG tablet Take 1 tablet (50 mg total) by mouth daily.  30 tablet  1  . metFORMIN (GLUCOPHAGE-XR) 500 MG 24 hr tablet Take 1,000 mg by mouth at bedtime.        . metoprolol (LOPRESSOR) 50 MG tablet Take 1.5 tablets (75  mg total) by mouth 2 (two) times daily.  90 tablet  1  . simvastatin (ZOCOR) 10 MG tablet Take 1 tablet (10 mg total) by mouth at bedtime.  90 tablet  3  . Tamsulosin HCl (FLOMAX) 0.4 MG CAPS Take 0.4 mg by mouth at bedtime.       . traMADol (ULTRAM) 50 MG tablet Take 1-2 tablets (50-100 mg total) by mouth every 6 (six) hours as needed for pain.  40 tablet  0  . warfarin (COUMADIN) 5 MG tablet Take 5-10 mg by mouth See admin instructions. Takes coumadin 10 mg each day except on Monday and Friday takes 5 mg      . venlafaxine (EFFEXOR) 75 MG tablet Take 150mg  in the morning and 75mg  at bedtime.       No current facility-administered medications for this visit.    Physical Exam: BP 92/56  Pulse 93  Resp 18  Ht 5\' 4"  (1.626 m)  Wt 183 lb (83.008 kg)  BMI 31.4 kg/m2  SpO2 97% He looks well. Cardiac exam shows a regular rate and rhythm with a 1/6 systolic murmur along the right upper sternal border. Lung  exam is clear. The chest incision is healing well and sternum is stable. There is no peripheral edema.  Diagnostic Tests:  *RADIOLOGY REPORT*   Clinical Data: Aortic valve replacement 1 month ago, follow-up   CHEST - 2 VIEW   Comparison: Chest x-ray of 09/19/2012   Findings: Basilar fibrosis right greater than left is stable.  No active infiltrate or effusion is seen.  Cardiomegaly is stable. Median sternotomy sutures are noted from prior aortic valve replacement.  There are degenerative changes throughout the thoracic spine.   IMPRESSION: Stable cardiomegaly.  No active lung disease.  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     Original Report Authenticated By: Dwyane Dee, M.D.     Impression:  Overall I think he is making good progress following aortic valve replacement after prior coronary bypass surgery. He said that he feels better than he did preoperatively. I encouraged him to ambulate as much as possible. He is somewhat limited due to severe aortoiliac  disease with lower extremity claudication. He is interested in participating in cardiac rehabilitation. I told him he could return to driving a car once weaned off oxygen but should not lift anything heavier than 10 pounds for 3 months postoperatively. He has a followup baseline echocardiogram scheduled.We will ask home health nursing to wean off his oxygen as long as his sats are 88% or greater.  Plan:  He will continue followup with Dr. Excell Seltzer and Dr. Jarold Motto and will contact me if he develops any problems with his incisions.

## 2012-10-04 ENCOUNTER — Ambulatory Visit: Payer: Medicare Other | Admitting: Cardiovascular Disease

## 2012-10-04 ENCOUNTER — Telehealth: Payer: Self-pay | Admitting: Cardiovascular Disease

## 2012-10-04 ENCOUNTER — Ambulatory Visit (INDEPENDENT_AMBULATORY_CARE_PROVIDER_SITE_OTHER): Payer: Medicare Other | Admitting: Cardiovascular Disease

## 2012-10-04 DIAGNOSIS — Z954 Presence of other heart-valve replacement: Secondary | ICD-10-CM

## 2012-10-04 DIAGNOSIS — Z7901 Long term (current) use of anticoagulants: Secondary | ICD-10-CM

## 2012-10-04 DIAGNOSIS — I350 Nonrheumatic aortic (valve) stenosis: Secondary | ICD-10-CM

## 2012-10-04 DIAGNOSIS — I4891 Unspecified atrial fibrillation: Secondary | ICD-10-CM

## 2012-10-04 DIAGNOSIS — I359 Nonrheumatic aortic valve disorder, unspecified: Secondary | ICD-10-CM

## 2012-10-04 LAB — POCT INR: INR: 2.5

## 2012-10-04 NOTE — Telephone Encounter (Deleted)
New Problem:    Patient called in because he has an upcoming dental appointment and would like to know how to proceed with his .  Please call back.

## 2012-10-10 ENCOUNTER — Encounter (HOSPITAL_COMMUNITY): Payer: Self-pay | Admitting: Cardiac Rehabilitation

## 2012-10-13 ENCOUNTER — Encounter (HOSPITAL_COMMUNITY)
Admission: RE | Admit: 2012-10-13 | Discharge: 2012-10-13 | Disposition: A | Discharge status: Home / Self Care | Payer: Medicare Other | Source: Ambulatory Visit | Attending: Internal Medicine | Admitting: Internal Medicine

## 2012-10-13 DIAGNOSIS — Z951 Presence of aortocoronary bypass graft: Secondary | ICD-10-CM | POA: Insufficient documentation

## 2012-10-13 DIAGNOSIS — I10 Essential (primary) hypertension: Secondary | ICD-10-CM | POA: Insufficient documentation

## 2012-10-13 DIAGNOSIS — I359 Nonrheumatic aortic valve disorder, unspecified: Secondary | ICD-10-CM | POA: Insufficient documentation

## 2012-10-13 DIAGNOSIS — I2510 Atherosclerotic heart disease of native coronary artery without angina pectoris: Secondary | ICD-10-CM | POA: Insufficient documentation

## 2012-10-13 DIAGNOSIS — Z5189 Encounter for other specified aftercare: Secondary | ICD-10-CM | POA: Insufficient documentation

## 2012-10-13 DIAGNOSIS — I4892 Unspecified atrial flutter: Secondary | ICD-10-CM | POA: Insufficient documentation

## 2012-10-13 NOTE — Progress Notes (Signed)
Cardiac Rehab Medication Review by a Pharmacist  Does the patient  feel that his/her medications are working for him/her?  yes  Has the patient been experiencing any side effects to the medications prescribed?  no  Does the patient measure his/her own blood pressure or blood glucose at home?  yes   Does the patient have any problems obtaining medications due to transportation or finances?   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   Adam [name] 10/13/2012 8:40 AM

## 2012-10-14 ENCOUNTER — Ambulatory Visit (INDEPENDENT_AMBULATORY_CARE_PROVIDER_SITE_OTHER): Payer: Medicare Other | Admitting: Internal Medicine

## 2012-10-14 DIAGNOSIS — I4891 Unspecified atrial fibrillation: Secondary | ICD-10-CM

## 2012-10-14 DIAGNOSIS — Z7901 Long term (current) use of anticoagulants: Secondary | ICD-10-CM

## 2012-10-14 DIAGNOSIS — I350 Nonrheumatic aortic (valve) stenosis: Secondary | ICD-10-CM

## 2012-10-14 DIAGNOSIS — Z954 Presence of other heart-valve replacement: Secondary | ICD-10-CM

## 2012-10-14 DIAGNOSIS — I359 Nonrheumatic aortic valve disorder, unspecified: Secondary | ICD-10-CM

## 2012-10-14 LAB — POCT INR: INR: 1.8

## 2012-10-17 ENCOUNTER — Encounter (HOSPITAL_COMMUNITY)
Admission: RE | Admit: 2012-10-17 | Discharge: 2012-10-17 | Disposition: A | Discharge status: Home / Self Care | Payer: Medicare Other | Source: Ambulatory Visit | Attending: Internal Medicine | Admitting: Internal Medicine

## 2012-10-17 LAB — GLUCOSE, CAPILLARY: Glucose-Capillary: 135 mg/dL — ABNORMAL HIGH (ref 70–99)

## 2012-10-17 NOTE — Progress Notes (Signed)
Pt started cardiac rehab today.  Pt tolerated light exercise without difficulty.  Monitor showed sr with first degree block and BBB.  Pt with abberant beats noted on ekg.  Will fax to Dr. Johney Frame for review and interpretation.  Pt with history of Afib and bifascicular block,  Pt with modification in equipment setting due to right arm discomfort that pt reports has been present since his heart surgery.  Continue to monitor.

## 2012-10-19 ENCOUNTER — Encounter (HOSPITAL_COMMUNITY): Payer: Medicare Other

## 2012-10-21 ENCOUNTER — Encounter (HOSPITAL_COMMUNITY): Payer: Medicare Other

## 2012-10-24 ENCOUNTER — Encounter (HOSPITAL_COMMUNITY)
Admission: RE | Admit: 2012-10-24 | Discharge: 2012-10-24 | Disposition: A | Discharge status: Home / Self Care | Payer: Medicare Other | Source: Ambulatory Visit | Attending: Internal Medicine | Admitting: Internal Medicine

## 2012-10-24 DIAGNOSIS — Z951 Presence of aortocoronary bypass graft: Secondary | ICD-10-CM | POA: Insufficient documentation

## 2012-10-24 DIAGNOSIS — I359 Nonrheumatic aortic valve disorder, unspecified: Secondary | ICD-10-CM | POA: Insufficient documentation

## 2012-10-24 DIAGNOSIS — I4892 Unspecified atrial flutter: Secondary | ICD-10-CM | POA: Insufficient documentation

## 2012-10-24 DIAGNOSIS — I10 Essential (primary) hypertension: Secondary | ICD-10-CM | POA: Insufficient documentation

## 2012-10-24 DIAGNOSIS — I2510 Atherosclerotic heart disease of native coronary artery without angina pectoris: Secondary | ICD-10-CM | POA: Insufficient documentation

## 2012-10-24 DIAGNOSIS — Z5189 Encounter for other specified aftercare: Secondary | ICD-10-CM | POA: Insufficient documentation

## 2012-10-24 LAB — GLUCOSE, CAPILLARY: Glucose-Capillary: 188 mg/dL — ABNORMAL HIGH (ref 70–99)

## 2012-10-26 ENCOUNTER — Encounter (HOSPITAL_COMMUNITY)
Admission: RE | Admit: 2012-10-26 | Discharge: 2012-10-26 | Disposition: A | Discharge status: Home / Self Care | Payer: Medicare Other | Source: Ambulatory Visit | Attending: Internal Medicine | Admitting: Internal Medicine

## 2012-10-27 ENCOUNTER — Other Ambulatory Visit: Payer: Self-pay | Admitting: Physician Assistant

## 2012-10-28 ENCOUNTER — Encounter (HOSPITAL_COMMUNITY)
Admission: RE | Admit: 2012-10-28 | Discharge: 2012-10-28 | Disposition: A | Discharge status: Home / Self Care | Payer: Medicare Other | Source: Ambulatory Visit | Attending: Internal Medicine | Admitting: Internal Medicine

## 2012-10-31 ENCOUNTER — Encounter: Payer: Self-pay | Admitting: Internal Medicine

## 2012-10-31 ENCOUNTER — Ambulatory Visit (INDEPENDENT_AMBULATORY_CARE_PROVIDER_SITE_OTHER): Payer: Medicare Other | Admitting: *Deleted

## 2012-10-31 ENCOUNTER — Other Ambulatory Visit: Payer: Self-pay

## 2012-10-31 ENCOUNTER — Ambulatory Visit (HOSPITAL_COMMUNITY): Payer: Medicare Other | Attending: Cardiology | Admitting: Radiology

## 2012-10-31 ENCOUNTER — Encounter (HOSPITAL_COMMUNITY): Payer: Medicare Other

## 2012-10-31 ENCOUNTER — Ambulatory Visit (INDEPENDENT_AMBULATORY_CARE_PROVIDER_SITE_OTHER): Payer: Medicare Other | Admitting: Internal Medicine

## 2012-10-31 ENCOUNTER — Ambulatory Visit: Payer: Self-pay | Admitting: Internal Medicine

## 2012-10-31 VITALS — BP 114/62 | HR 75 | Ht 65.0 in | Wt 191.0 lb

## 2012-10-31 DIAGNOSIS — I4891 Unspecified atrial fibrillation: Secondary | ICD-10-CM

## 2012-10-31 DIAGNOSIS — I350 Nonrheumatic aortic (valve) stenosis: Secondary | ICD-10-CM

## 2012-10-31 DIAGNOSIS — Z09 Encounter for follow-up examination after completed treatment for conditions other than malignant neoplasm: Secondary | ICD-10-CM | POA: Insufficient documentation

## 2012-10-31 DIAGNOSIS — I10 Essential (primary) hypertension: Secondary | ICD-10-CM

## 2012-10-31 DIAGNOSIS — Z952 Presence of prosthetic heart valve: Secondary | ICD-10-CM | POA: Insufficient documentation

## 2012-10-31 DIAGNOSIS — I359 Nonrheumatic aortic valve disorder, unspecified: Secondary | ICD-10-CM

## 2012-10-31 DIAGNOSIS — I2510 Atherosclerotic heart disease of native coronary artery without angina pectoris: Secondary | ICD-10-CM

## 2012-10-31 DIAGNOSIS — Z7901 Long term (current) use of anticoagulants: Secondary | ICD-10-CM

## 2012-10-31 DIAGNOSIS — Z954 Presence of other heart-valve replacement: Secondary | ICD-10-CM

## 2012-10-31 DIAGNOSIS — I471 Supraventricular tachycardia: Secondary | ICD-10-CM

## 2012-10-31 DIAGNOSIS — I498 Other specified cardiac arrhythmias: Secondary | ICD-10-CM

## 2012-10-31 LAB — POCT INR: INR: 2

## 2012-10-31 MED ORDER — METOPROLOL TARTRATE 100 MG PO TABS: 100.0000 mg | ORAL_TABLET | Freq: Two times a day (BID) | ORAL | Status: DC

## 2012-10-31 NOTE — Assessment & Plan Note (Signed)
Stable No change required today  

## 2012-10-31 NOTE — Patient Instructions (Addendum)
Increase Metoprolol to 100mg  twice daily.  Your physician recommends that you schedule a follow-up appointment in: 3 months with Lawson Fiscal and 6 months with Dr Johney Frame.

## 2012-10-31 NOTE — Progress Notes (Signed)
PCP:  Garlan Fillers, MD  The patient presents today for routine electrophysiology followup.  Since his recent AVR, the patient reports doing very well.  His SOB is much improved.  He is pleased with his progress. Today, he denies symptoms of palpitations, chest pain, orthopnea, PND, lower extremity edema, dizziness, presyncope, syncope, or neurologic sequela.  The patient feels that he is tolerating medications without difficulties and is otherwise without complaint today.   Past Medical History  Diagnosis Date  . Hypertension   . Hyperlipidemia   . Type II or unspecified type diabetes mellitus without mention of complication, not stated as uncontrolled   . Nephrolithiasis   . Atrial flutter     s/p CTI ablation by Dr Ladona Ridgel 1/12  . CAD (coronary artery disease)     s/p CABG 2003 by Dr Laneta Simmers  . Aortic stenosis, moderate     moderate to severe per echo 03/2012  . DJD (degenerative joint disease)   . DDD (degenerative disc disease)   . Spinal stenosis of lumbar region   . Bifascicular block   . Atrial tachycardia   . S/P CABG x 5 08/19/2012    CABGx5 by Dr Laneta Simmers: LIMA to LAD, SVG to OM, sequential SVG to PDA and LPLB, SVG to AM, EVH and open vein harvest from right thigh and leg  . Shortness of breath   . Anxiety   . Depression    Past Surgical History  Procedure Laterality Date  . Cabg  2003    by Dr Laneta Simmers  . Anal fissure repair    . Leg surgery      right  . Trigger finger release      bilat.  . Tonsillectomy    . Back surgery  2010  . Atrial ablation surgery  1/12    atrial flutter ablation by Dr Ladona Ridgel  . Cardioversion  04/07/2012    Procedure: CARDIOVERSION;  Surgeon: Wendall Stade, MD;  Location: [facility] ENDOSCOPY;  Service: Cardiovascular;  Laterality: N/A;  . Coronary artery bypass graft      10 yrs ago   03  . Aortic valve replacement N/A 09/01/2012    Procedure: AORTIC VALVE REPLACEMENT (AVR);  Surgeon: Alleen Borne, MD;  Location: [facility] OR;  Service: Open  Heart Surgery;  Laterality: N/A;  . Intraoperative transesophageal echocardiogram N/A 09/01/2012    Procedure: INTRAOPERATIVE TRANSESOPHAGEAL ECHOCARDIOGRAM;  Surgeon: Alleen Borne, MD;  Location: [facility] OR;  Service: Open Heart Surgery;  Laterality: N/A;    Current Outpatient Prescriptions  Medication Sig Dispense Refill  . albuterol (PROVENTIL HFA;VENTOLIN HFA) 108 (90 BASE) MCG/ACT inhaler Inhale 2 puffs into the lungs every 6 (six) hours as needed for wheezing.      [name] aspirin EC 325 MG EC tablet Take 1 tablet (325 mg total) by mouth daily.  30 tablet    . budesonide-formoterol (SYMBICORT) 160-4.5 MCG/ACT inhaler Inhale 1 puff into the lungs 2 (two) times daily.       . carisoprodol (SOMA) 350 MG tablet Take 350 mg by mouth at bedtime.       . cilostazol (PLETAL) 50 MG tablet Take 1 tablet by mouth Twice daily.      [name] HUMALOG 100 UNIT/ML injection Inject 30 Units into the skin 2 (two) times daily.       [name] HUMULIN N 100 UNIT/ML injection Inject 20 Units into the skin 3 (three) times daily.       [name] LORazepam (ATIVAN) 1 MG tablet  Take 1-2 mg by mouth at bedtime as needed.        [name] losartan (COZAAR) 50 MG tablet Take 1 tablet (50 mg total) by mouth daily.  30 tablet  1  . metFORMIN (GLUCOPHAGE-XR) 500 MG 24 hr tablet Take 1,000 mg by mouth at bedtime.        . metoprolol (LOPRESSOR) 100 MG tablet Take 1 tablet (100 mg total) by mouth 2 (two) times daily.  180 tablet  3  . simvastatin (ZOCOR) 10 MG tablet Take 1 tablet (10 mg total) by mouth at bedtime.  90 tablet  3  . Tamsulosin HCl (FLOMAX) 0.4 MG CAPS Take 0.4 mg by mouth at bedtime.       [name] venlafaxine (EFFEXOR) 75 MG tablet Take 150mg  in the morning and 75mg  at bedtime.      [name] warfarin (COUMADIN) 5 MG tablet Take 5-10 mg by mouth See admin instructions. Takes coumadin 10 mg each day except on Monday, Wednesday and Friday takes 5 mg       No current facility-administered medications for this visit.    Allergies  Allergen Reactions  .  Doxycycline     REACTION: swollen tongue    History   Social History  . Marital Status: Married    Spouse Name: N/A    Number of Children: Y  . Years of Education: N/A   Occupational History  . pharmacist     retired   Social History Main Topics  . Smoking status: Former Smoker -- 1.00 packs/day for 49 years    Quit date: 08/21/2001  . Smokeless tobacco: Not on file     Comment: started at age 40.   [name] Alcohol Use: No  . Drug Use: No  . Sexually Active: No   Other Topics Concern  . Not on file   Social History Narrative    He is a retired Teacher, early years/pre.    Family History  Problem Relation Age of Onset  . Heart disease Father   . Heart disease Paternal Grandfather   . Stroke Mother   . Rheum arthritis Mother     Physical Exam: Filed Vitals:   10/31/12 0936  BP: 114/62  Pulse: 75  Height: 5\' 5"  (1.651 m)  Weight: 191 lb (86.637 kg)  SpO2: 95%    GEN- The patient is well appearing, alert and oriented x 3 today.   Head- normocephalic, atraumatic Eyes-  Sclera clear, conjunctiva pink Ears- hearing intact Oropharynx- clear Neck- supple, no JVP Lymph- no cervical lymphadenopathy Lungs- Clear to ausculation bilaterally, normal work of breathing Heart- Regular rate and rhythm, crisp A2 GI- soft, NT, ND, + BS Extremities- no clubbing, cyanosis, or edema  Assessment and Plan:

## 2012-10-31 NOTE — Progress Notes (Signed)
Echocardiogram performed.  

## 2012-10-31 NOTE — Assessment & Plan Note (Signed)
ekg today reveals sinus with frequent PACs.  Bifascicular block stable Will increase metoprolol to 100mg  BID.

## 2012-10-31 NOTE — Assessment & Plan Note (Signed)
S/p recent AVR Doing well Continue coumadin  Echo today

## 2012-11-01 ENCOUNTER — Other Ambulatory Visit: Payer: Self-pay | Admitting: *Deleted

## 2012-11-01 MED ORDER — LOSARTAN POTASSIUM 50 MG PO TABS: 50.0000 mg | ORAL_TABLET | Freq: Every day | ORAL | Status: DC

## 2012-11-02 ENCOUNTER — Encounter (HOSPITAL_COMMUNITY)
Admission: RE | Admit: 2012-11-02 | Discharge: 2012-11-02 | Disposition: A | Discharge status: Home / Self Care | Payer: Medicare Other | Source: Ambulatory Visit | Attending: Internal Medicine | Admitting: Internal Medicine

## 2012-11-04 ENCOUNTER — Encounter (HOSPITAL_COMMUNITY)
Admission: RE | Admit: 2012-11-04 | Discharge: 2012-11-04 | Disposition: A | Discharge status: Home / Self Care | Payer: Medicare Other | Source: Ambulatory Visit | Attending: Internal Medicine | Admitting: Internal Medicine

## 2012-11-04 LAB — GLUCOSE, CAPILLARY
Glucose-Capillary: 78 mg/dL (ref 70–99)
Glucose-Capillary: 87 mg/dL (ref 70–99)

## 2012-11-07 ENCOUNTER — Encounter (HOSPITAL_COMMUNITY): Payer: Medicare Other

## 2012-11-09 ENCOUNTER — Encounter (HOSPITAL_COMMUNITY)
Admission: RE | Admit: 2012-11-09 | Discharge: 2012-11-09 | Disposition: A | Discharge status: Home / Self Care | Payer: Medicare Other | Source: Ambulatory Visit | Attending: Internal Medicine | Admitting: Internal Medicine

## 2012-11-09 LAB — GLUCOSE, CAPILLARY: Glucose-Capillary: 164 mg/dL — ABNORMAL HIGH (ref 70–99)

## 2012-11-09 NOTE — Progress Notes (Signed)
Pt with new medication change due to low blood blood sugar readings at home.  Pt Lantus decreased to 38 units in the evening. Only to administer humolog insulin for  Elevated blood sugar readings.  Continue to monitor.

## 2012-11-11 ENCOUNTER — Encounter (HOSPITAL_COMMUNITY)
Admission: RE | Admit: 2012-11-11 | Discharge: 2012-11-11 | Disposition: A | Discharge status: Home / Self Care | Payer: Medicare Other | Source: Ambulatory Visit | Attending: Internal Medicine | Admitting: Internal Medicine

## 2012-11-14 ENCOUNTER — Encounter (HOSPITAL_COMMUNITY): Payer: Medicare Other

## 2012-11-16 ENCOUNTER — Encounter (HOSPITAL_COMMUNITY)
Admission: RE | Admit: 2012-11-16 | Discharge: 2012-11-16 | Disposition: A | Discharge status: Home / Self Care | Payer: Medicare Other | Source: Ambulatory Visit | Attending: Internal Medicine | Admitting: Internal Medicine

## 2012-11-18 ENCOUNTER — Encounter (HOSPITAL_COMMUNITY)
Admission: RE | Admit: 2012-11-18 | Discharge: 2012-11-18 | Disposition: A | Discharge status: Home / Self Care | Payer: Medicare Other | Source: Ambulatory Visit | Attending: Internal Medicine | Admitting: Internal Medicine

## 2012-11-21 ENCOUNTER — Encounter (HOSPITAL_COMMUNITY): Payer: Medicare Other

## 2012-11-23 ENCOUNTER — Encounter (HOSPITAL_COMMUNITY)
Admission: RE | Admit: 2012-11-23 | Discharge: 2012-11-23 | Disposition: A | Discharge status: Home / Self Care | Payer: Medicare Other | Source: Ambulatory Visit | Attending: Internal Medicine | Admitting: Internal Medicine

## 2012-11-23 DIAGNOSIS — I359 Nonrheumatic aortic valve disorder, unspecified: Secondary | ICD-10-CM | POA: Insufficient documentation

## 2012-11-23 DIAGNOSIS — I2510 Atherosclerotic heart disease of native coronary artery without angina pectoris: Secondary | ICD-10-CM | POA: Insufficient documentation

## 2012-11-23 DIAGNOSIS — Z5189 Encounter for other specified aftercare: Secondary | ICD-10-CM | POA: Insufficient documentation

## 2012-11-23 DIAGNOSIS — Z951 Presence of aortocoronary bypass graft: Secondary | ICD-10-CM | POA: Insufficient documentation

## 2012-11-23 DIAGNOSIS — I10 Essential (primary) hypertension: Secondary | ICD-10-CM | POA: Insufficient documentation

## 2012-11-23 DIAGNOSIS — I4892 Unspecified atrial flutter: Secondary | ICD-10-CM | POA: Insufficient documentation

## 2012-11-25 ENCOUNTER — Encounter (HOSPITAL_COMMUNITY): Payer: Medicare Other

## 2012-11-28 ENCOUNTER — Ambulatory Visit (INDEPENDENT_AMBULATORY_CARE_PROVIDER_SITE_OTHER): Payer: Medicare Other | Admitting: *Deleted

## 2012-11-28 ENCOUNTER — Encounter (HOSPITAL_COMMUNITY)
Admission: RE | Admit: 2012-11-28 | Discharge: 2012-11-28 | Disposition: A | Discharge status: Home / Self Care | Payer: Medicare Other | Source: Ambulatory Visit | Attending: Internal Medicine | Admitting: Internal Medicine

## 2012-11-28 DIAGNOSIS — I359 Nonrheumatic aortic valve disorder, unspecified: Secondary | ICD-10-CM

## 2012-11-28 DIAGNOSIS — Z954 Presence of other heart-valve replacement: Secondary | ICD-10-CM

## 2012-11-28 DIAGNOSIS — Z7901 Long term (current) use of anticoagulants: Secondary | ICD-10-CM

## 2012-11-28 DIAGNOSIS — I4891 Unspecified atrial fibrillation: Secondary | ICD-10-CM

## 2012-11-28 DIAGNOSIS — I350 Nonrheumatic aortic (valve) stenosis: Secondary | ICD-10-CM

## 2012-11-28 LAB — GLUCOSE, CAPILLARY: Glucose-Capillary: 167 mg/dL — ABNORMAL HIGH (ref 70–99)

## 2012-11-28 NOTE — Progress Notes (Signed)
Reviewed home exercise with pt today.  Pt plans to walking and using bike and stepper at home for exercise.  Reviewed THR, pulse, RPE, sign and symptoms, NTG use, and when to call 911 or MD.  Pt voiced understanding. Fabio Pierce, MA, ACSM RCEP

## 2012-11-30 ENCOUNTER — Encounter (HOSPITAL_COMMUNITY)
Admission: RE | Admit: 2012-11-30 | Discharge: 2012-11-30 | Disposition: A | Discharge status: Home / Self Care | Payer: Medicare Other | Source: Ambulatory Visit | Attending: Internal Medicine | Admitting: Internal Medicine

## 2012-11-30 LAB — GLUCOSE, CAPILLARY: Glucose-Capillary: 195 mg/dL — ABNORMAL HIGH (ref 70–99)

## 2012-12-02 ENCOUNTER — Encounter (HOSPITAL_COMMUNITY)
Admission: RE | Admit: 2012-12-02 | Discharge: 2012-12-02 | Disposition: A | Discharge status: Home / Self Care | Payer: Medicare Other | Source: Ambulatory Visit | Attending: Internal Medicine | Admitting: Internal Medicine

## 2012-12-05 ENCOUNTER — Encounter (HOSPITAL_COMMUNITY): Payer: Medicare Other

## 2012-12-07 ENCOUNTER — Encounter (HOSPITAL_COMMUNITY)
Admission: RE | Admit: 2012-12-07 | Discharge: 2012-12-07 | Disposition: A | Discharge status: Home / Self Care | Payer: Medicare Other | Source: Ambulatory Visit | Attending: Internal Medicine | Admitting: Internal Medicine

## 2012-12-07 ENCOUNTER — Ambulatory Visit: Payer: Medicare Other | Admitting: Pulmonary Disease

## 2012-12-09 ENCOUNTER — Encounter (HOSPITAL_COMMUNITY)
Admission: RE | Admit: 2012-12-09 | Discharge: 2012-12-09 | Disposition: A | Discharge status: Home / Self Care | Payer: Medicare Other | Source: Ambulatory Visit | Attending: Internal Medicine | Admitting: Internal Medicine

## 2012-12-12 ENCOUNTER — Encounter: Payer: Self-pay | Admitting: Internal Medicine

## 2012-12-12 ENCOUNTER — Encounter (HOSPITAL_COMMUNITY)
Admission: RE | Admit: 2012-12-12 | Discharge: 2012-12-12 | Disposition: A | Discharge status: Home / Self Care | Payer: Medicare Other | Source: Ambulatory Visit | Attending: Internal Medicine | Admitting: Internal Medicine

## 2012-12-14 ENCOUNTER — Telehealth (HOSPITAL_COMMUNITY): Payer: Self-pay | Admitting: *Deleted

## 2012-12-14 ENCOUNTER — Encounter (HOSPITAL_COMMUNITY)
Admission: RE | Admit: 2012-12-14 | Discharge: 2012-12-14 | Disposition: A | Discharge status: Home / Self Care | Payer: Medicare Other | Source: Ambulatory Visit | Attending: Internal Medicine | Admitting: Internal Medicine

## 2012-12-14 NOTE — Progress Notes (Signed)
Adam [name]'s heart rate was noted at 103 sinus tach.  Adam [name] is not sure he took his morning medications including his betablocker. Blood pressure is 120/60. Patient asymptomatic.  I advised that Adam [name] not exercise today. Adam [name] went home to check to see if he took his medication and will call us back. Will continue to monitor the patient throughout  the program. Home CBG 181.

## 2012-12-16 ENCOUNTER — Encounter (HOSPITAL_COMMUNITY)
Admission: RE | Admit: 2012-12-16 | Discharge: 2012-12-16 | Disposition: A | Discharge status: Home / Self Care | Payer: Medicare Other | Source: Ambulatory Visit | Attending: Internal Medicine | Admitting: Internal Medicine

## 2012-12-16 LAB — GLUCOSE, CAPILLARY: Glucose-Capillary: 159 mg/dL — ABNORMAL HIGH (ref 70–99)

## 2012-12-19 ENCOUNTER — Other Ambulatory Visit: Payer: Self-pay | Admitting: *Deleted

## 2012-12-19 ENCOUNTER — Encounter (HOSPITAL_COMMUNITY)
Admission: RE | Admit: 2012-12-19 | Discharge: 2012-12-19 | Disposition: A | Discharge status: Home / Self Care | Payer: Medicare Other | Source: Ambulatory Visit | Attending: Internal Medicine | Admitting: Internal Medicine

## 2012-12-19 MED ORDER — WARFARIN SODIUM 5 MG PO TABS: ORAL_TABLET | ORAL | Status: DC

## 2012-12-21 ENCOUNTER — Encounter (HOSPITAL_COMMUNITY)
Admission: RE | Admit: 2012-12-21 | Discharge: 2012-12-21 | Disposition: A | Discharge status: Home / Self Care | Payer: Medicare Other | Source: Ambulatory Visit | Attending: Internal Medicine | Admitting: Internal Medicine

## 2012-12-21 DIAGNOSIS — I359 Nonrheumatic aortic valve disorder, unspecified: Secondary | ICD-10-CM | POA: Insufficient documentation

## 2012-12-21 DIAGNOSIS — I2510 Atherosclerotic heart disease of native coronary artery without angina pectoris: Secondary | ICD-10-CM | POA: Insufficient documentation

## 2012-12-21 DIAGNOSIS — I10 Essential (primary) hypertension: Secondary | ICD-10-CM | POA: Insufficient documentation

## 2012-12-21 DIAGNOSIS — Z5189 Encounter for other specified aftercare: Secondary | ICD-10-CM | POA: Insufficient documentation

## 2012-12-21 DIAGNOSIS — Z951 Presence of aortocoronary bypass graft: Secondary | ICD-10-CM | POA: Insufficient documentation

## 2012-12-21 DIAGNOSIS — I4892 Unspecified atrial flutter: Secondary | ICD-10-CM | POA: Insufficient documentation

## 2012-12-21 LAB — GLUCOSE, CAPILLARY: Glucose-Capillary: 177 mg/dL — ABNORMAL HIGH (ref 70–99)

## 2012-12-23 ENCOUNTER — Encounter (HOSPITAL_COMMUNITY): Payer: Medicare Other

## 2012-12-26 ENCOUNTER — Encounter (HOSPITAL_COMMUNITY): Payer: Medicare Other

## 2012-12-28 ENCOUNTER — Ambulatory Visit: Payer: Self-pay | Admitting: Pulmonary Disease

## 2012-12-28 ENCOUNTER — Encounter (HOSPITAL_COMMUNITY)
Admission: RE | Admit: 2012-12-28 | Discharge: 2012-12-28 | Disposition: A | Discharge status: Home / Self Care | Payer: Medicare Other | Source: Ambulatory Visit | Attending: Internal Medicine | Admitting: Internal Medicine

## 2012-12-28 LAB — GLUCOSE, CAPILLARY: Glucose-Capillary: 164 mg/dL — ABNORMAL HIGH (ref 70–99)

## 2012-12-30 ENCOUNTER — Encounter (HOSPITAL_COMMUNITY)
Admission: RE | Admit: 2012-12-30 | Discharge: 2012-12-30 | Disposition: A | Discharge status: Home / Self Care | Payer: Medicare Other | Source: Ambulatory Visit | Attending: Internal Medicine | Admitting: Internal Medicine

## 2013-01-02 ENCOUNTER — Ambulatory Visit (INDEPENDENT_AMBULATORY_CARE_PROVIDER_SITE_OTHER): Payer: Medicare Other | Admitting: *Deleted

## 2013-01-02 ENCOUNTER — Encounter (HOSPITAL_COMMUNITY)
Admission: RE | Admit: 2013-01-02 | Discharge: 2013-01-02 | Disposition: A | Discharge status: Home / Self Care | Payer: Medicare Other | Source: Ambulatory Visit | Attending: Internal Medicine | Admitting: Internal Medicine

## 2013-01-02 DIAGNOSIS — Z7901 Long term (current) use of anticoagulants: Secondary | ICD-10-CM

## 2013-01-02 DIAGNOSIS — I4891 Unspecified atrial fibrillation: Secondary | ICD-10-CM

## 2013-01-02 DIAGNOSIS — Z954 Presence of other heart-valve replacement: Secondary | ICD-10-CM

## 2013-01-02 DIAGNOSIS — I359 Nonrheumatic aortic valve disorder, unspecified: Secondary | ICD-10-CM

## 2013-01-02 DIAGNOSIS — I350 Nonrheumatic aortic (valve) stenosis: Secondary | ICD-10-CM

## 2013-01-02 LAB — POCT INR: INR: 3.1

## 2013-01-04 ENCOUNTER — Encounter (HOSPITAL_COMMUNITY): Payer: Medicare Other

## 2013-01-06 ENCOUNTER — Encounter (HOSPITAL_COMMUNITY)
Admission: RE | Admit: 2013-01-06 | Discharge: 2013-01-06 | Disposition: A | Discharge status: Home / Self Care | Payer: Medicare Other | Source: Ambulatory Visit | Attending: Internal Medicine | Admitting: Internal Medicine

## 2013-01-06 LAB — GLUCOSE, CAPILLARY: Glucose-Capillary: 187 mg/dL — ABNORMAL HIGH (ref 70–99)

## 2013-01-09 ENCOUNTER — Encounter (HOSPITAL_COMMUNITY)
Admission: RE | Admit: 2013-01-09 | Discharge: 2013-01-09 | Disposition: A | Discharge status: Home / Self Care | Payer: Medicare Other | Source: Ambulatory Visit | Attending: Internal Medicine | Admitting: Internal Medicine

## 2013-01-11 ENCOUNTER — Encounter (HOSPITAL_COMMUNITY): Payer: Medicare Other

## 2013-01-13 ENCOUNTER — Encounter (HOSPITAL_COMMUNITY): Payer: Medicare Other

## 2013-01-16 ENCOUNTER — Encounter (HOSPITAL_COMMUNITY)
Admission: RE | Admit: 2013-01-16 | Discharge: 2013-01-16 | Disposition: A | Discharge status: Home / Self Care | Payer: Medicare Other | Source: Ambulatory Visit | Attending: Internal Medicine | Admitting: Internal Medicine

## 2013-01-18 ENCOUNTER — Encounter (HOSPITAL_COMMUNITY)
Admission: RE | Admit: 2013-01-18 | Discharge: 2013-01-18 | Disposition: A | Discharge status: Home / Self Care | Payer: Medicare Other | Source: Ambulatory Visit | Attending: Internal Medicine | Admitting: Internal Medicine

## 2013-01-18 LAB — GLUCOSE, CAPILLARY: Glucose-Capillary: 205 mg/dL — ABNORMAL HIGH (ref 70–99)

## 2013-01-20 ENCOUNTER — Encounter (HOSPITAL_COMMUNITY): Payer: Medicare Other

## 2013-01-23 ENCOUNTER — Encounter (HOSPITAL_COMMUNITY): Payer: Medicare Other

## 2013-01-25 ENCOUNTER — Encounter (HOSPITAL_COMMUNITY)
Admission: RE | Admit: 2013-01-25 | Discharge: 2013-01-25 | Disposition: A | Discharge status: Home / Self Care | Payer: Medicare Other | Source: Ambulatory Visit | Attending: Internal Medicine | Admitting: Internal Medicine

## 2013-01-25 DIAGNOSIS — I359 Nonrheumatic aortic valve disorder, unspecified: Secondary | ICD-10-CM | POA: Insufficient documentation

## 2013-01-25 DIAGNOSIS — I2510 Atherosclerotic heart disease of native coronary artery without angina pectoris: Secondary | ICD-10-CM | POA: Insufficient documentation

## 2013-01-25 DIAGNOSIS — Z951 Presence of aortocoronary bypass graft: Secondary | ICD-10-CM | POA: Insufficient documentation

## 2013-01-25 DIAGNOSIS — I4892 Unspecified atrial flutter: Secondary | ICD-10-CM | POA: Insufficient documentation

## 2013-01-25 DIAGNOSIS — Z5189 Encounter for other specified aftercare: Secondary | ICD-10-CM | POA: Insufficient documentation

## 2013-01-25 DIAGNOSIS — I10 Essential (primary) hypertension: Secondary | ICD-10-CM | POA: Insufficient documentation

## 2013-01-25 LAB — GLUCOSE, CAPILLARY: Glucose-Capillary: 160 mg/dL — ABNORMAL HIGH (ref 70–99)

## 2013-01-27 ENCOUNTER — Encounter (HOSPITAL_COMMUNITY)
Admission: RE | Admit: 2013-01-27 | Discharge: 2013-01-27 | Disposition: A | Discharge status: Home / Self Care | Payer: Medicare Other | Source: Ambulatory Visit | Attending: Internal Medicine | Admitting: Internal Medicine

## 2013-01-30 ENCOUNTER — Encounter (HOSPITAL_COMMUNITY)
Admission: RE | Admit: 2013-01-30 | Discharge: 2013-01-30 | Disposition: A | Discharge status: Home / Self Care | Payer: Medicare Other | Source: Ambulatory Visit | Attending: Internal Medicine | Admitting: Internal Medicine

## 2013-01-31 ENCOUNTER — Ambulatory Visit (INDEPENDENT_AMBULATORY_CARE_PROVIDER_SITE_OTHER): Payer: Medicare Other | Admitting: *Deleted

## 2013-01-31 ENCOUNTER — Ambulatory Visit (INDEPENDENT_AMBULATORY_CARE_PROVIDER_SITE_OTHER): Payer: Medicare Other | Admitting: Nurse Practitioner

## 2013-01-31 ENCOUNTER — Encounter: Payer: Self-pay | Admitting: Nurse Practitioner

## 2013-01-31 VITALS — BP 120/80 | HR 64 | Ht 65.0 in | Wt 186.4 lb

## 2013-01-31 DIAGNOSIS — I2510 Atherosclerotic heart disease of native coronary artery without angina pectoris: Secondary | ICD-10-CM

## 2013-01-31 DIAGNOSIS — Z954 Presence of other heart-valve replacement: Secondary | ICD-10-CM

## 2013-01-31 DIAGNOSIS — I4891 Unspecified atrial fibrillation: Secondary | ICD-10-CM

## 2013-01-31 DIAGNOSIS — I359 Nonrheumatic aortic valve disorder, unspecified: Secondary | ICD-10-CM

## 2013-01-31 DIAGNOSIS — Z952 Presence of prosthetic heart valve: Secondary | ICD-10-CM

## 2013-01-31 DIAGNOSIS — Z7901 Long term (current) use of anticoagulants: Secondary | ICD-10-CM

## 2013-01-31 DIAGNOSIS — I350 Nonrheumatic aortic (valve) stenosis: Secondary | ICD-10-CM

## 2013-01-31 NOTE — Patient Instructions (Addendum)
Continue with your current medicines  See Dr. Johney Frame back in February of 2015 - you will get a letter in December to call and make this appointment.   Stay active  Call the [facility] office at [phone] if you have any questions, problems or concerns.

## 2013-01-31 NOTE — Progress Notes (Signed)
Adam [name] Date of Birth: 1935/01/31 Medical Record #[id number]  History of Present Illness: Adam [name] is seen back today for a 3 month check. Seen for Dr. Johney Frame. He has multiple issues which include known CAD with prior CABG back in 2003, last stress study in 2008. Has moderate to severe AS with recent AVR (March 2014) per Dr. Laneta Simmers. Has had atrial flutter which required ablation back in 2012. He had recurrent atrial tach back in the fall of 2013. Had cardioversion but was quick to return to tachycardia. It has been elected to continue with rate control and anticoagulation. He remains on his coumadin. His other issues include spinal stenosis, DM, HLD and HTN. He is a retired Teacher, early years/pre.   He comes in today. Here alone. Doing well. Feels pretty good. Not having chest pain. Not short of breath. Not dizzy or lightheaded. No passing out spells. No palpitations. Feels ok on his medicines.   Current Outpatient Prescriptions  Medication Sig Dispense Refill  . albuterol (PROVENTIL HFA;VENTOLIN HFA) 108 (90 BASE) MCG/ACT inhaler Inhale 2 puffs into the lungs every 6 (six) hours as needed for wheezing.      [name] aspirin EC 325 MG EC tablet Take 1 tablet (325 mg total) by mouth daily.  30 tablet    . budesonide-formoterol (SYMBICORT) 160-4.5 MCG/ACT inhaler Inhale 1 puff into the lungs as needed.       . carisoprodol (SOMA) 350 MG tablet Take 350 mg by mouth at bedtime.       . cilostazol (PLETAL) 50 MG tablet Take 1 tablet by mouth Twice daily.      . insulin glargine (LANTUS) 100 UNIT/ML injection Inject 35 Units into the skin at bedtime.      . insulin lispro (HUMALOG) 100 UNIT/ML injection Inject 5 Units into the skin as needed for high blood sugar.      [name] LORazepam (ATIVAN) 1 MG tablet Take 1-2 mg by mouth at bedtime as needed.        [name] losartan (COZAAR) 50 MG tablet Take 1 tablet (50 mg total) by mouth daily.  90 tablet  3  . metFORMIN (GLUCOPHAGE-XR) 500 MG 24 hr tablet Take 1,000 mg by mouth  at bedtime.        . metoprolol (LOPRESSOR) 100 MG tablet Take 1 tablet (100 mg total) by mouth 2 (two) times daily.  180 tablet  3  . simvastatin (ZOCOR) 10 MG tablet Take 1 tablet (10 mg total) by mouth at bedtime.  90 tablet  3  . Tamsulosin HCl (FLOMAX) 0.4 MG CAPS Take 0.4 mg by mouth at bedtime.       [name] venlafaxine (EFFEXOR) 75 MG tablet Take 150mg  in the morning and 75mg  at bedtime.      [name] warfarin (COUMADIN) 5 MG tablet Take as directed by coumadin clinic  150 tablet  1   No current facility-administered medications for this visit.    Allergies  Allergen Reactions  . Doxycycline     REACTION: swollen tongue    Past Medical History  Diagnosis Date  . Hypertension   . Hyperlipidemia   . Type II or unspecified type diabetes mellitus without mention of complication, not stated as uncontrolled   . Nephrolithiasis   . Atrial flutter     s/p CTI ablation by Dr Ladona Ridgel 1/12  . CAD (coronary artery disease)     s/p CABG x 5 2003 by Dr Laneta Simmers  . Aortic stenosis, moderate     moderate to  severe per echo 03/2012 with AVR in March of 2014  . DJD (degenerative joint disease)   . DDD (degenerative disc disease)   . Spinal stenosis of lumbar region   . Bifascicular block   . Atrial tachycardia   . Anxiety   . Depression   . S/P AVR (aortic valve replacement)     Past Surgical History  Procedure Laterality Date  . Cabg  2003    by Dr Laneta Simmers  . Anal fissure repair    . Leg surgery      right  . Trigger finger release      bilat.  . Tonsillectomy    . Back surgery  2010  . Atrial ablation surgery  1/12    atrial flutter ablation by Dr Ladona Ridgel  . Cardioversion  04/07/2012    Procedure: CARDIOVERSION;  Surgeon: Wendall Stade, MD;  Location: [facility]COPY;  Service: Cardiovascular;  Laterality: N/A;  . Coronary artery bypass graft      10 yrs ago   03  . Aortic valve replacement N/A 09/01/2012    Procedure: AORTIC VALVE REPLACEMENT (AVR);  Surgeon: Alleen Borne, MD;   Location: [facility] OR;  Service: Open Heart Surgery;  Laterality: N/A;  . Intraoperative transesophageal echocardiogram N/A 09/01/2012    Procedure: INTRAOPERATIVE TRANSESOPHAGEAL ECHOCARDIOGRAM;  Surgeon: Alleen Borne, MD;  Location: [facility] OR;  Service: Open Heart Surgery;  Laterality: N/A;    History  Smoking status  . Former Smoker -- 1.00 packs/day for 49 years  . Quit date: 08/21/2001  Smokeless tobacco  . Not on file    Comment: started at age 58.     History  Alcohol Use No    Family History  Problem Relation Age of Onset  . Heart disease Father   . Heart disease Paternal Grandfather   . Stroke Mother   . Rheum arthritis Mother     Review of Systems: The review of systems is per the HPI.  All other systems were reviewed and are negative.  Physical Exam: BP 120/80  Pulse 64  Ht 5\' 5"  (1.651 m)  Wt 186 lb 6.4 oz (84.55 kg)  BMI 31.02 kg/m2 Patient is very pleasant and in no acute distress. Has lost weight - down 5 pounds over the last 3 months. Skin is warm and dry. Color is normal.  HEENT is unremarkable. Normocephalic/atraumatic. PERRL. Sclera are nonicteric. Neck is supple. No masses. No JVD. Lungs are clear but sounds like he has a pleural rub on the right. Cardiac exam shows a regular rate and rhythm. Very soft outflow murmur. Abdomen is obese but soft. Extremities are without edema. Gait and ROM are intact. No gross neurologic deficits noted.  LABORATORY DATA:  Lab Results  Component Value Date   WBC 13.4* 09/05/2012   HGB 9.3* 09/05/2012   HCT 27.4* 09/05/2012   PLT 274 09/05/2012   GLUCOSE 58* 09/05/2012   ALT 15 08/31/2012   AST 17 08/31/2012   NA 137 09/05/2012   K 3.5 09/05/2012   CL 101 09/05/2012   CREATININE 0.76 09/05/2012   BUN 25* 09/05/2012   CO2 25 09/05/2012   TSH 1.33 09/30/2010   INR 2.6 01/31/2013   HGBA1C 6.2* 08/31/2012   Lab Results  Component Value Date   INR 2.6 01/31/2013   INR 3.1 01/02/2013   INR 2.7 11/28/2012   Echo Study Conclusions from May  of 2014  - Left ventricle: The cavity size was normal. Wall thickness was normal.  Systolic function was normal. The estimated ejection fraction was in the range of 60% to 65%. Wall motion was normal; there were no regional wall motion abnormalities. Features are consistent with a pseudonormal left ventricular filling pattern, with concomitant abnormal relaxation and increased filling pressure (grade 2 diastolic dysfunction). - Aortic valve: A bioprosthesis was present. Mean gradient: 13mm Hg (S). Peak gradient: 25mm Hg (S). - Mitral valve: Moderately calcified annulus. Normal thickness leaflets . The findings are consistent with trivial stenosis. Mean gradient: 6mm Hg (D). Peak gradient: 12mm Hg (D). - Left atrium: The atrium was moderately dilated.   CHEST - 2 VIEW  Comparison: Chest x-ray of 09/19/2012  Findings: Basilar fibrosis right greater than left is stable. No active infiltrate or effusion is seen. Cardiomegaly is stable. Median sternotomy sutures are noted from prior aortic valve replacement. There are degenerative changes throughout the thoracic spine.  IMPRESSION: Stable cardiomegaly. No active lung disease. 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   Original Report Authenticated By: Dwyane Dee, M.D.        Assessment / Plan: 1. Recent AVR - doing well - almost 6 months out from his surgery - has had his echo updated. I think overall he is doing well. He is seeing Dr. Johney Frame back in February.   2. Atrial flutter/tachycardia - on chronic coumadin - in sinus by exam today. No symptoms reported.   3. CAD - past CABG back in 2003 - no chest pain. Encourage to continue with CV risk factor modification.   4. Chronic coumadin - no problems noted.   Patient is agreeable to this plan and will call if any problems develop in the interim.   Rosalio Macadamia, RN, ANP-C [facility] [address] [place], [place]   [address]

## 2013-02-01 ENCOUNTER — Encounter (HOSPITAL_COMMUNITY)
Admission: RE | Admit: 2013-02-01 | Discharge: 2013-02-01 | Disposition: A | Discharge status: Home / Self Care | Payer: Medicare Other | Source: Ambulatory Visit | Attending: Internal Medicine | Admitting: Internal Medicine

## 2013-02-03 ENCOUNTER — Encounter (HOSPITAL_COMMUNITY): Payer: Medicare Other

## 2013-02-06 ENCOUNTER — Encounter (HOSPITAL_COMMUNITY)
Admission: RE | Admit: 2013-02-06 | Discharge: 2013-02-06 | Disposition: A | Discharge status: Home / Self Care | Payer: Medicare Other | Source: Ambulatory Visit | Attending: Internal Medicine | Admitting: Internal Medicine

## 2013-02-06 ENCOUNTER — Encounter: Payer: Self-pay | Admitting: Pulmonary Disease

## 2013-02-06 ENCOUNTER — Ambulatory Visit (INDEPENDENT_AMBULATORY_CARE_PROVIDER_SITE_OTHER): Payer: Medicare Other | Admitting: Pulmonary Disease

## 2013-02-06 VITALS — BP 118/78 | HR 79 | Temp 98.0°F | Ht 65.0 in | Wt 188.4 lb

## 2013-02-06 DIAGNOSIS — J449 Chronic obstructive pulmonary disease, unspecified: Secondary | ICD-10-CM

## 2013-02-06 NOTE — Assessment & Plan Note (Signed)
The patient is doing well overall from a pulmonary standpoint, but is not able to maintain on a maintenance regimen because of cost.  I have listed the various options, and he will check with his insurance to see if any are less expensive.  The meantime, I've given him samples of symbicort to use, and he is to stay on as needed albuterol.  He is to continue on oxygen at h.s., and I have encouraged him to work aggressively on weight loss.

## 2013-02-06 NOTE — Patient Instructions (Addendum)
Will give you samples of symbicort, and check some of the alternatives to see if they are better covered on insurance Stay on oxygen at night, and use albuterol as needed. Keep working on weight loss.  You are doing well. followup with me in 6mos

## 2013-02-06 NOTE — Progress Notes (Signed)
  Subjective:    Patient ID: Adam [name], male    DOB: 1934-12-23, 78 y.o.   MRN: [id number]  HPI Patient comes in today for followup of his known COPD.  He was unable to afford maintenance bronchodilators, and therefore is just using albuterol as needed.  He is maintaining on nocturnal oxygen, but refuses to wear oxygen with exertion.  He has not had an acute exacerbation since the last visit, and currently feels that his exertional tolerance is at baseline.  He has lost 11 pounds since the last visit here.   Review of Systems  Constitutional: Negative for fever and unexpected weight change.  HENT: Negative for ear pain, nosebleeds, congestion, sore throat, rhinorrhea, sneezing, trouble swallowing, dental problem, postnasal drip and sinus pressure.   Eyes: Negative for redness and itching.  Respiratory: Negative for cough, chest tightness, shortness of breath and wheezing.   Cardiovascular: Negative for palpitations and leg swelling.  Gastrointestinal: Negative for nausea and vomiting.  Genitourinary: Negative for dysuria.  Musculoskeletal: Negative for joint swelling.  Skin: Negative for rash.  Neurological: Negative for headaches.  Hematological: Does not bruise/bleed easily.  Psychiatric/Behavioral: Negative for dysphoric mood. The patient is not nervous/anxious.        Objective:   Physical Exam Overweight male in no acute distress Nose without purulence or discharge noted Neck without lymphadenopathy or thyromegaly Chest with a few basilar crackles, however adequate airflow with no wheezing Cardiac exam with slightly irregular rhythm with controlled ventricular response Lower extremities with minimal edema, no cyanosis Alert and oriented, moves all 4 extremities.       Assessment & Plan:

## 2013-02-08 ENCOUNTER — Encounter (HOSPITAL_COMMUNITY): Payer: Medicare Other

## 2013-02-10 ENCOUNTER — Encounter (HOSPITAL_COMMUNITY)
Admission: RE | Admit: 2013-02-10 | Discharge: 2013-02-10 | Disposition: A | Discharge status: Home / Self Care | Payer: Medicare Other | Source: Ambulatory Visit | Attending: Internal Medicine | Admitting: Internal Medicine

## 2013-02-13 ENCOUNTER — Encounter (HOSPITAL_COMMUNITY)
Admission: RE | Admit: 2013-02-13 | Discharge: 2013-02-13 | Disposition: A | Discharge status: Home / Self Care | Payer: Medicare Other | Source: Ambulatory Visit | Attending: Internal Medicine | Admitting: Internal Medicine

## 2013-02-15 ENCOUNTER — Encounter (HOSPITAL_COMMUNITY)
Admission: RE | Admit: 2013-02-15 | Discharge: 2013-02-15 | Disposition: A | Discharge status: Home / Self Care | Payer: Medicare Other | Source: Ambulatory Visit | Attending: Internal Medicine | Admitting: Internal Medicine

## 2013-02-17 ENCOUNTER — Encounter (HOSPITAL_COMMUNITY)
Admission: RE | Admit: 2013-02-17 | Discharge: 2013-02-17 | Disposition: A | Discharge status: Home / Self Care | Payer: Medicare Other | Source: Ambulatory Visit | Attending: Internal Medicine | Admitting: Internal Medicine

## 2013-02-22 ENCOUNTER — Encounter (HOSPITAL_COMMUNITY)
Admission: RE | Admit: 2013-02-22 | Discharge: 2013-02-22 | Disposition: A | Discharge status: Home / Self Care | Payer: Medicare Other | Source: Ambulatory Visit | Attending: Internal Medicine | Admitting: Internal Medicine

## 2013-02-22 ENCOUNTER — Telehealth: Payer: Self-pay | Admitting: Internal Medicine

## 2013-02-22 DIAGNOSIS — I10 Essential (primary) hypertension: Secondary | ICD-10-CM | POA: Insufficient documentation

## 2013-02-22 DIAGNOSIS — Z5189 Encounter for other specified aftercare: Secondary | ICD-10-CM | POA: Insufficient documentation

## 2013-02-22 DIAGNOSIS — I2510 Atherosclerotic heart disease of native coronary artery without angina pectoris: Secondary | ICD-10-CM | POA: Insufficient documentation

## 2013-02-22 DIAGNOSIS — I4892 Unspecified atrial flutter: Secondary | ICD-10-CM | POA: Insufficient documentation

## 2013-02-22 DIAGNOSIS — I359 Nonrheumatic aortic valve disorder, unspecified: Secondary | ICD-10-CM | POA: Insufficient documentation

## 2013-02-22 DIAGNOSIS — Z951 Presence of aortocoronary bypass graft: Secondary | ICD-10-CM | POA: Insufficient documentation

## 2013-02-22 NOTE — Telephone Encounter (Signed)
New Problem  Pt needs a referral to the cardiovascular group for rehab.

## 2013-02-23 NOTE — Progress Notes (Signed)
Pt completed 36 exercise sessions in cardiac rehab phase II.  Pt plans to continue his home exercise in the [facility] Cardiac rehab maintenance program.  Repeat PHQ2 score - 0.   Medication list reconciled.

## 2013-02-24 ENCOUNTER — Encounter (HOSPITAL_COMMUNITY): Payer: Medicare Other

## 2013-02-24 NOTE — Telephone Encounter (Signed)
Faxed back.

## 2013-02-28 ENCOUNTER — Other Ambulatory Visit (HOSPITAL_COMMUNITY): Payer: Self-pay | Admitting: *Deleted

## 2013-03-01 ENCOUNTER — Encounter (HOSPITAL_COMMUNITY)
Admission: RE | Admit: 2013-03-01 | Discharge: 2013-03-01 | Disposition: A | Discharge status: Home / Self Care | Payer: Self-pay | Source: Ambulatory Visit | Attending: Internal Medicine | Admitting: Internal Medicine

## 2013-03-01 DIAGNOSIS — Z951 Presence of aortocoronary bypass graft: Secondary | ICD-10-CM | POA: Insufficient documentation

## 2013-03-01 DIAGNOSIS — I4892 Unspecified atrial flutter: Secondary | ICD-10-CM | POA: Insufficient documentation

## 2013-03-01 DIAGNOSIS — I2510 Atherosclerotic heart disease of native coronary artery without angina pectoris: Secondary | ICD-10-CM | POA: Insufficient documentation

## 2013-03-01 DIAGNOSIS — I359 Nonrheumatic aortic valve disorder, unspecified: Secondary | ICD-10-CM | POA: Insufficient documentation

## 2013-03-01 DIAGNOSIS — I10 Essential (primary) hypertension: Secondary | ICD-10-CM | POA: Insufficient documentation

## 2013-03-01 DIAGNOSIS — Z5189 Encounter for other specified aftercare: Secondary | ICD-10-CM | POA: Insufficient documentation

## 2013-03-03 ENCOUNTER — Encounter (HOSPITAL_COMMUNITY): Payer: Self-pay

## 2013-03-06 ENCOUNTER — Encounter (HOSPITAL_COMMUNITY): Payer: Medicare Other

## 2013-03-08 ENCOUNTER — Encounter (HOSPITAL_COMMUNITY): Payer: Medicare Other

## 2013-03-10 ENCOUNTER — Encounter (HOSPITAL_COMMUNITY): Payer: Medicare Other

## 2013-03-13 ENCOUNTER — Encounter (HOSPITAL_COMMUNITY): Payer: Medicare Other

## 2013-03-14 ENCOUNTER — Ambulatory Visit (INDEPENDENT_AMBULATORY_CARE_PROVIDER_SITE_OTHER): Payer: Medicare Other | Admitting: *Deleted

## 2013-03-14 DIAGNOSIS — Z954 Presence of other heart-valve replacement: Secondary | ICD-10-CM

## 2013-03-14 DIAGNOSIS — Z7901 Long term (current) use of anticoagulants: Secondary | ICD-10-CM

## 2013-03-14 DIAGNOSIS — I4891 Unspecified atrial fibrillation: Secondary | ICD-10-CM

## 2013-03-14 DIAGNOSIS — I350 Nonrheumatic aortic (valve) stenosis: Secondary | ICD-10-CM

## 2013-03-14 DIAGNOSIS — I359 Nonrheumatic aortic valve disorder, unspecified: Secondary | ICD-10-CM

## 2013-03-14 LAB — POCT INR: INR: 4.2

## 2013-03-15 ENCOUNTER — Encounter (HOSPITAL_COMMUNITY): Payer: Medicare Other

## 2013-03-17 ENCOUNTER — Encounter (HOSPITAL_COMMUNITY)
Admission: RE | Admit: 2013-03-17 | Discharge: 2013-03-17 | Disposition: A | Discharge status: Home / Self Care | Payer: Self-pay | Source: Ambulatory Visit | Attending: Internal Medicine | Admitting: Internal Medicine

## 2013-03-20 ENCOUNTER — Encounter (HOSPITAL_COMMUNITY): Payer: Medicare Other

## 2013-03-22 ENCOUNTER — Encounter (HOSPITAL_COMMUNITY): Payer: Medicare Other

## 2013-03-24 ENCOUNTER — Encounter (HOSPITAL_COMMUNITY): Payer: Medicare Other

## 2013-03-27 ENCOUNTER — Encounter (HOSPITAL_COMMUNITY): Payer: Medicare Other

## 2013-03-29 ENCOUNTER — Encounter (HOSPITAL_COMMUNITY): Payer: Medicare Other

## 2013-03-31 ENCOUNTER — Encounter (HOSPITAL_COMMUNITY): Payer: Medicare Other

## 2013-04-03 ENCOUNTER — Encounter (HOSPITAL_COMMUNITY): Payer: Medicare Other

## 2013-04-04 ENCOUNTER — Ambulatory Visit (INDEPENDENT_AMBULATORY_CARE_PROVIDER_SITE_OTHER): Payer: Medicare Other | Admitting: Pharmacist

## 2013-04-04 DIAGNOSIS — I359 Nonrheumatic aortic valve disorder, unspecified: Secondary | ICD-10-CM

## 2013-04-04 DIAGNOSIS — Z7901 Long term (current) use of anticoagulants: Secondary | ICD-10-CM

## 2013-04-04 DIAGNOSIS — Z954 Presence of other heart-valve replacement: Secondary | ICD-10-CM

## 2013-04-04 DIAGNOSIS — I4891 Unspecified atrial fibrillation: Secondary | ICD-10-CM

## 2013-04-04 DIAGNOSIS — I350 Nonrheumatic aortic (valve) stenosis: Secondary | ICD-10-CM

## 2013-04-04 LAB — POCT INR: INR: 4.6

## 2013-04-05 ENCOUNTER — Encounter (HOSPITAL_COMMUNITY): Payer: Medicare Other

## 2013-04-07 ENCOUNTER — Encounter (HOSPITAL_COMMUNITY): Payer: Medicare Other

## 2013-04-10 ENCOUNTER — Encounter (HOSPITAL_COMMUNITY): Payer: Medicare Other

## 2013-04-12 ENCOUNTER — Encounter (HOSPITAL_COMMUNITY): Payer: Medicare Other

## 2013-04-14 ENCOUNTER — Encounter (HOSPITAL_COMMUNITY): Payer: Medicare Other

## 2013-04-17 ENCOUNTER — Encounter (HOSPITAL_COMMUNITY): Payer: Medicare Other

## 2013-04-19 ENCOUNTER — Encounter (HOSPITAL_COMMUNITY): Payer: Medicare Other

## 2013-04-21 ENCOUNTER — Encounter (HOSPITAL_COMMUNITY): Payer: Medicare Other

## 2013-04-24 ENCOUNTER — Encounter (HOSPITAL_COMMUNITY): Payer: Medicare Other

## 2013-04-25 ENCOUNTER — Ambulatory Visit (INDEPENDENT_AMBULATORY_CARE_PROVIDER_SITE_OTHER): Payer: Medicare Other | Admitting: *Deleted

## 2013-04-25 DIAGNOSIS — I350 Nonrheumatic aortic (valve) stenosis: Secondary | ICD-10-CM

## 2013-04-25 DIAGNOSIS — I359 Nonrheumatic aortic valve disorder, unspecified: Secondary | ICD-10-CM

## 2013-04-25 DIAGNOSIS — Z954 Presence of other heart-valve replacement: Secondary | ICD-10-CM

## 2013-04-25 DIAGNOSIS — I4891 Unspecified atrial fibrillation: Secondary | ICD-10-CM

## 2013-04-25 DIAGNOSIS — Z7901 Long term (current) use of anticoagulants: Secondary | ICD-10-CM

## 2013-04-25 LAB — POCT INR: INR: 8

## 2013-04-25 LAB — PROTIME-INR: INR: 8.2 ratio (ref 0.8–1.0)

## 2013-04-26 ENCOUNTER — Encounter (HOSPITAL_COMMUNITY): Payer: Medicare Other

## 2013-04-28 ENCOUNTER — Encounter (HOSPITAL_COMMUNITY): Payer: Medicare Other

## 2013-04-28 ENCOUNTER — Ambulatory Visit (INDEPENDENT_AMBULATORY_CARE_PROVIDER_SITE_OTHER): Payer: Medicare Other | Admitting: Pharmacist

## 2013-04-28 DIAGNOSIS — I4891 Unspecified atrial fibrillation: Secondary | ICD-10-CM

## 2013-04-28 DIAGNOSIS — I359 Nonrheumatic aortic valve disorder, unspecified: Secondary | ICD-10-CM

## 2013-04-28 DIAGNOSIS — Z954 Presence of other heart-valve replacement: Secondary | ICD-10-CM

## 2013-04-28 DIAGNOSIS — Z7901 Long term (current) use of anticoagulants: Secondary | ICD-10-CM

## 2013-04-28 DIAGNOSIS — I350 Nonrheumatic aortic (valve) stenosis: Secondary | ICD-10-CM

## 2013-04-28 LAB — POCT INR: INR: 1.2

## 2013-05-01 ENCOUNTER — Encounter (HOSPITAL_COMMUNITY): Payer: Medicare Other

## 2013-05-03 ENCOUNTER — Ambulatory Visit (INDEPENDENT_AMBULATORY_CARE_PROVIDER_SITE_OTHER): Payer: Medicare Other | Admitting: Pharmacist

## 2013-05-03 ENCOUNTER — Encounter (HOSPITAL_COMMUNITY): Payer: Medicare Other

## 2013-05-03 DIAGNOSIS — I359 Nonrheumatic aortic valve disorder, unspecified: Secondary | ICD-10-CM

## 2013-05-03 DIAGNOSIS — I4891 Unspecified atrial fibrillation: Secondary | ICD-10-CM

## 2013-05-03 DIAGNOSIS — I350 Nonrheumatic aortic (valve) stenosis: Secondary | ICD-10-CM

## 2013-05-03 DIAGNOSIS — Z7901 Long term (current) use of anticoagulants: Secondary | ICD-10-CM

## 2013-05-03 DIAGNOSIS — Z954 Presence of other heart-valve replacement: Secondary | ICD-10-CM

## 2013-05-05 ENCOUNTER — Encounter (HOSPITAL_COMMUNITY): Payer: Medicare Other

## 2013-05-08 ENCOUNTER — Encounter (HOSPITAL_COMMUNITY): Payer: Medicare Other

## 2013-05-10 ENCOUNTER — Encounter (HOSPITAL_COMMUNITY): Payer: Medicare Other

## 2013-05-10 ENCOUNTER — Telehealth: Payer: Self-pay | Admitting: Neurology

## 2013-05-11 ENCOUNTER — Ambulatory Visit (INDEPENDENT_AMBULATORY_CARE_PROVIDER_SITE_OTHER): Payer: Medicare Other | Admitting: Pharmacist

## 2013-05-11 DIAGNOSIS — Z954 Presence of other heart-valve replacement: Secondary | ICD-10-CM

## 2013-05-11 DIAGNOSIS — I4891 Unspecified atrial fibrillation: Secondary | ICD-10-CM

## 2013-05-11 DIAGNOSIS — Z7901 Long term (current) use of anticoagulants: Secondary | ICD-10-CM

## 2013-05-11 DIAGNOSIS — I359 Nonrheumatic aortic valve disorder, unspecified: Secondary | ICD-10-CM

## 2013-05-11 DIAGNOSIS — I350 Nonrheumatic aortic (valve) stenosis: Secondary | ICD-10-CM

## 2013-05-11 NOTE — Telephone Encounter (Signed)
Called patient and spoke with his daughter [name] about making an appt. I informed the patient's daughter that the patient has not been seen at this practice and also told her that the patient would have to have a referral first before he can be seen. Patient's daughter, [name] verbalized understanding. Patient's daughter stated that she would make sure that his PCP sends a referral over to [place].

## 2013-05-12 ENCOUNTER — Encounter (HOSPITAL_COMMUNITY): Payer: Medicare Other

## 2013-05-15 ENCOUNTER — Encounter (HOSPITAL_COMMUNITY): Payer: Medicare Other

## 2013-05-15 ENCOUNTER — Other Ambulatory Visit: Payer: Self-pay | Admitting: *Deleted

## 2013-05-15 MED ORDER — WARFARIN SODIUM 5 MG PO TABS: ORAL_TABLET | ORAL | Status: DC

## 2013-05-17 ENCOUNTER — Encounter (HOSPITAL_COMMUNITY): Payer: Medicare Other

## 2013-05-22 ENCOUNTER — Encounter (HOSPITAL_COMMUNITY): Payer: Medicare Other

## 2013-05-24 ENCOUNTER — Encounter (HOSPITAL_COMMUNITY): Payer: Medicare Other

## 2013-05-25 ENCOUNTER — Ambulatory Visit (INDEPENDENT_AMBULATORY_CARE_PROVIDER_SITE_OTHER): Payer: Medicare Other | Admitting: Pharmacist

## 2013-05-25 DIAGNOSIS — I359 Nonrheumatic aortic valve disorder, unspecified: Secondary | ICD-10-CM

## 2013-05-25 DIAGNOSIS — I4891 Unspecified atrial fibrillation: Secondary | ICD-10-CM

## 2013-05-25 DIAGNOSIS — Z7901 Long term (current) use of anticoagulants: Secondary | ICD-10-CM

## 2013-05-25 DIAGNOSIS — I350 Nonrheumatic aortic (valve) stenosis: Secondary | ICD-10-CM

## 2013-05-25 DIAGNOSIS — Z954 Presence of other heart-valve replacement: Secondary | ICD-10-CM

## 2013-05-25 LAB — POCT INR: INR: 3.4

## 2013-05-26 ENCOUNTER — Encounter (HOSPITAL_COMMUNITY): Payer: Medicare Other

## 2013-05-29 ENCOUNTER — Encounter: Payer: Self-pay | Admitting: Neurology

## 2013-05-29 ENCOUNTER — Encounter (INDEPENDENT_AMBULATORY_CARE_PROVIDER_SITE_OTHER): Payer: Self-pay

## 2013-05-29 ENCOUNTER — Encounter (HOSPITAL_COMMUNITY): Payer: Medicare Other

## 2013-05-29 ENCOUNTER — Ambulatory Visit (INDEPENDENT_AMBULATORY_CARE_PROVIDER_SITE_OTHER): Payer: Medicare Other | Admitting: Neurology

## 2013-05-29 VITALS — BP 159/72 | HR 72 | Resp 18 | Ht 65.25 in | Wt 188.0 lb

## 2013-05-29 DIAGNOSIS — G931 Anoxic brain damage, not elsewhere classified: Secondary | ICD-10-CM

## 2013-05-29 DIAGNOSIS — G4700 Insomnia, unspecified: Secondary | ICD-10-CM

## 2013-05-29 DIAGNOSIS — G9781 Other intraoperative complications of nervous system: Secondary | ICD-10-CM

## 2013-05-29 DIAGNOSIS — F0280 Dementia in other diseases classified elsewhere without behavioral disturbance: Secondary | ICD-10-CM | POA: Insufficient documentation

## 2013-05-29 DIAGNOSIS — R413 Other amnesia: Secondary | ICD-10-CM

## 2013-05-29 DIAGNOSIS — G4730 Sleep apnea, unspecified: Secondary | ICD-10-CM | POA: Insufficient documentation

## 2013-05-29 DIAGNOSIS — G4733 Obstructive sleep apnea (adult) (pediatric): Secondary | ICD-10-CM | POA: Insufficient documentation

## 2013-05-29 HISTORY — DX: Dementia in other diseases classified elsewhere, unspecified severity, without behavioral disturbance, psychotic disturbance, mood disturbance, and anxiety: F02.80

## 2013-05-29 NOTE — Patient Instructions (Signed)
Hypersomnia Hypersomnia usually brings recurrent episodes of excessive daytime sleepiness or prolonged nighttime sleep. It is different than feeling tired due to lack of or interrupted sleep at night. People with hypersomnia are compelled to nap repeatedly during the day. This is often at inappropriate times such as:  At work.  During a meal.  In conversation. These daytime naps usually provide no relief. This disorder typically affects adolescents and young adults. CAUSES  This condition may be caused by:  Another sleep disorder (such as narcolepsy or sleep apnea).  Dysfunction of the autonomic nervous system.  Drug or alcohol abuse.  A physical problem, such as:  A tumor.  Head trauma. This is damage caused by an accident.  Injury to the central nervous system.  Certain medications, or medicine withdrawal.  Medical conditions may contribute to the disorder, including:  Multiple sclerosis.  Depression.  Encephalitis.  Epilepsy.  Obesity.  Some people appear to have a genetic predisposition to this disorder. In others, there is no known cause. SYMPTOMS   Patients often have difficulty waking from a long sleep. They may feel dazed or confused.  Other symptoms may include:  Anxiety.  Increased irritation (inflammation).  Decreased energy.  Restlessness.  Slow thinking.  Slow speech.  Loss of appetite.  Hallucinations.  Memory difficulty.  Tremors, Tics.  Some patients lose the ability to function in family, social, occupational, or other settings. TREATMENT  Treatment is symptomatic in nature. Stimulants and other drugs may be used to treat this disorder. Changes in behavior may help. For example, avoid night work and social activities that delay bed time. Changes in diet may offer some relief. Patients should avoid alcohol and caffeine. PROGNOSIS  The likely outcome (prognosis) for persons with hypersomnia depends on the cause of the disorder.  The disorder itself is not life threatening. But it can have serious consequences. For example, automobile accidents can be caused by falling asleep while driving. The attacks usually continue indefinitely. Document Released: 05/29/2002 Document Revised: 08/31/2011 Document Reviewed: 05/02/2008 [employer] Patient Information 2014 [employer].  

## 2013-05-29 NOTE — Progress Notes (Signed)
[facility]  Provider:  Melvyn Novas, M D  Referring Provider: Jarome Matin, MD Primary Care Physician:  Garlan Fillers, MD  Chief Complaint  Patient presents with  . Sleeping Problem    HPI:  Adam [name] is a 78 y.o. male , a retired Teacher, early years/pre . He  is seen here as a referral   from [name] for a sleep consultation.  Adam [name], and married, 68 your old right-handed Caucasian gentleman was today referred by Dr. Jarold Motto for evaluation of insomnia, snoring, and a sleep disorder that seems to have evolved since by bypass surgery  11 years ago, he had aortic valve repair earlier this year by Dr. Laneta Simmers. Since surgery he feels more fatigued and short of breath, sluggish and forgetful. During cardiac rehab he made progress but he stopped the exercises.   I reviewed his medications, he confirmed that he takes these as indicated below.   He goes to bed at the same time week days and week ends. He is going to sleep at midnight, his wife will already be in bed by 10 PM. He will have trouble initiating sleep, denies having slept on sofa or recliner, he watches TV in the bedroom, and disturbes his wife. He will need Ambien to initiate sleep, will go to the bedroom some nights 4-5 times. It is off all nocturnal sleep time at about 6 hours. He has always awoken spontaneously in the morning he does not need an alarm at fax 1. His wife reports that he actually sleeps through the alarm until he wakes up spontaneously- between 11 and 12 in the morning. The couples adult daughter lives with [name].    His wife endorsed the Epworth score for him at 20 points, FSS at 50 points.  Mr Ging responses took a very long time, he appears  bradykinetik and bradyphrenic.  He has lost the ability to screen the answering machine on his phone and to use his camera, apraxia.    He is short of breath while speaking to me. His  voice is raspy . His wife states here he is now  napping throughout the day, often for several hours. He is snoring loudly and she witnessed some crescendo snoring, raising suspicions for OSA.  Mr Olden reports he has several bathroom breaks a day, attributed this to age, he has morning headaches. He has no REM BD, no sleep walking. But he is often confused in the morning. He has lost interest in reading  , conversations he reads   short magazine articles. He watches more TV. His personality has changed since he is sleepier or  because of the sleepiness. He  never bounced back from surgery.      Review of Systems: Out of a complete 14 system review, the patient complains of only the following symptoms, and all other reviewed systems are negative.  Epworth 1- versus 20 points, raspy voice, slowed motion, stumbled and has fallen in his yard once.  Legs " buckle"/  History   Social History  . Marital Status: Married    Spouse Name: Adam [name]    Number of Children: Y  . Years of Education: Adam [name]   Occupational History  . pharmacist     retired   Social History Main Topics  . Smoking status: Former Smoker -- 1.00 packs/day for 49 years    Quit date: 08/21/2001  . Smokeless tobacco: Not on file     Comment: started at age 77.   [name]  Alcohol Use: No     Comment: quit 15 yrs ago  . Drug Use: No  . Sexual Activity: No   Other Topics Concern  . Not on file   Social History Narrative    He is a retired Teacher, early years/pre.    Family History  Problem Relation Age of Onset  . Heart disease Father   . Heart disease Paternal Grandfather   . Stroke Mother   . Rheum arthritis Mother     Past Medical History  Diagnosis Date  . Hypertension   . Hyperlipidemia   . Type II or unspecified type diabetes mellitus without mention of complication, not stated as uncontrolled   . Nephrolithiasis   . Atrial flutter     s/p CTI ablation by Dr Ladona Ridgel 1/12  . CAD (coronary artery disease)     s/p CABG x 5 2003 by Dr Laneta Simmers  . Aortic stenosis, moderate      moderate to severe per echo 03/2012 with AVR in March of 2014  . DJD (degenerative joint disease)   . DDD (degenerative disc disease)   . Spinal stenosis of lumbar region   . Bifascicular block   . Atrial tachycardia   . Anxiety   . Depression   . S/P AVR (aortic valve replacement)   . OSA (obstructive sleep apnea)     Past Surgical History  Procedure Laterality Date  . Cabg  2003    by Dr Laneta Simmers  . Anal fissure repair    . Leg surgery      right  . Trigger finger release      bilat.  . Tonsillectomy    . Back surgery  2010  . Atrial ablation surgery  1/12    atrial flutter ablation by Dr Ladona Ridgel  . Cardioversion  04/07/2012    Procedure: CARDIOVERSION;  Surgeon: Wendall Stade, MD;  Location[facility] END[facility]COPY;  Service: Cardiovascular;  Laterality: Adam [name];  . Coronary artery bypass graft      10 yrs ago   03  . Aortic valve replacement Adam [name] 09/01/2012    Procedure: AORTIC VALVE REPLACEMENT (AVR);  Surgeon: Alleen Borne, MD;  Location: [facility] OR;  Service: Open Heart Surgery;  Laterality: Adam [name];  . Intraoperative transesophageal echocardiogram Adam [name] 09/01/2012    Procedure: INTRAOPERATIVE TRANSESOPHAGEAL ECHOCARDIOGRAM;  Surgeon: Alleen Borne, MD;  Location: [facility] OR;  Service: Open Heart Surgery;  Laterality: Adam [name];    Current Outpatient Prescriptions  Medication Sig Dispense Refill  . albuterol (PROVENTIL HFA;VENTOLIN HFA) 108 (90 BASE) MCG/ACT inhaler Inhale 2 puffs into the lungs every 6 (six) hours as needed for wheezing.      [name] aspirin EC 325 MG EC tablet Take 1 tablet (325 mg total) by mouth daily.  30 tablet    . cilostazol (PLETAL) 50 MG tablet Take 1 tablet by mouth Twice daily.      . insulin glargine (LANTUS) 100 UNIT/ML injection Inject 35 Units into the skin at bedtime.      . insulin lispro (HUMALOG) 100 UNIT/ML injection Inject 5 Units into the skin as needed for high blood sugar.      [name] LORazepam (ATIVAN) 1 MG tablet Take 1-2 mg by mouth at bedtime as needed.        [name] losartan  (COZAAR) 50 MG tablet Take 1 tablet (50 mg total) by mouth daily.  90 tablet  3  . metFORMIN (GLUCOPHAGE-XR) 500 MG 24 hr tablet Take 1,000 mg by mouth at bedtime.        [name]  metoprolol (LOPRESSOR) 100 MG tablet Take 1 tablet (100 mg total) by mouth 2 (two) times daily.  180 tablet  3  . simvastatin (ZOCOR) 10 MG tablet Take 1 tablet (10 mg total) by mouth at bedtime.  90 tablet  3  . Tamsulosin HCl (FLOMAX) 0.4 MG CAPS Take 0.4 mg by mouth at bedtime.       [name] venlafaxine (EFFEXOR) 75 MG tablet Take 150mg  in the morning and 75mg  at bedtime.      [name] warfarin (COUMADIN) 5 MG tablet Take as directed by coumadin clinic  150 tablet  1  . zolpidem (AMBIEN) 5 MG tablet Take 5 mg by mouth at bedtime.      . carisoprodol (SOMA) 350 MG tablet Take 350 mg by mouth at bedtime.       . fluticasone (FLONASE) 50 MCG/ACT nasal spray        No current facility-administered medications for this visit.    Allergies as of 05/29/2013 - Review Complete 05/29/2013  Allergen Reaction Noted  . Doxycycline      Vitals: BP 159/72  Pulse 72  Resp 18  Ht 5' 5.25" (1.657 m)  Wt 188 lb (85.276 kg)  BMI 31.06 kg/m2 Last Weight:  Wt Readings from Last 1 Encounters:  05/29/13 188 lb (85.276 kg)   Last Height:   Ht Readings from Last 1 Encounters:  05/29/13 5' 5.25" (1.657 m)    Physical exam:  General: The patient is awake, alert and appears not in acute distress. The patient is well groomed. Head: Normocephalic, atraumatic. Neck is supple. Mallampati 4 , neck circumference:18 , no TMJ - Cardiovascular:  Regular rate and rhythm , without carotid bruit, and without distended neck veins. Respiratory: Lungs are clear to auscultation. Skin:  Without evidence of edema, or rash, he has had femoral artery disease, leg claudication.  Trunk: BMI is  elevated .  Neurologic exam : The patient is sluggish, slow  and alert, oriented to place and time.   Memory subjective  described as impaired . There is an abnormal  attention span & concentration ability. He perseverates.   Speech is fluent without  Dysarthria, but with  dysphonia .  Mood and affect are appropriate.  Cranial nerves: Pupils are equal and briskly reactive to light. Funduscopic exam without  evidence of pallor or edema. Extraocular movements  in vertical and horizontal planes intact and without nystagmus. Visual fields by finger perimetry are intact. Hearing to finger rub intact.  Facial sensation intact to fine touch. Facial motor strength is symmetric and tongue  move midline. Left uvula lower, he has a drooping mouth, open while speaking, sitting at rest.   Motor exam:   Normal tone and muscle bulk and symmetric strength in all extremities.  Sensory:  Fine touch, pinprick and vibration were tested in all extremities. Proprioception  Normal.   Coordination: Rapid alternating movements in the fingers/hands is tested and normal.  Finger-to-nose maneuver tested and normal without evidence of ataxia, dysmetria or tremor.  Gait and station: Patient walks without assistive device . Slow ambulation, bend forwards . Stance is wide based . Tandem gait is fragmented. Romberg testing is abnormal.  Deep tendon reflexes: in the  upper and lower extremities are symmetric and intact. Babinski maneuver response is  downgoing.   Assessment:  After physical and neurologic examination, review of laboratory studies, imaging, neurophysiology testing and pre-existing records, assessment is   1)  Vascular dementia versus protracted effect of peri-surgical hypoxemia. There are some  features of parkinsonism.      Gait instability, masked face, cognitive decline but  Not RLS ,  REM BD , no tremor at rest. Bradyphrenic and slow motion.   2)   possible  Spinal stenosis versus vascular claudication ( femoral artery occlusion). No cramps, but needs to sit down before falling- and than can resume movements after 2-5 minutes.      Plan:  Treatment plan and  additional workup :  1)EDS - sleep study SPLIT at 4% and AHi of 15 , will need oxygen and Co2 recording. Ambien to be brought from home.  2)Will revisit with MOCA and MMSE , and consider aricept.  MRI brain needed.

## 2013-05-29 NOTE — Addendum Note (Signed)
Addended by: Melvyn Novas on: 05/29/2013 11:51 AM   Modules accepted: Orders

## 2013-05-31 ENCOUNTER — Encounter (HOSPITAL_COMMUNITY): Payer: Medicare Other

## 2013-06-02 ENCOUNTER — Encounter (HOSPITAL_COMMUNITY): Payer: Medicare Other

## 2013-06-05 ENCOUNTER — Encounter (HOSPITAL_COMMUNITY): Payer: Medicare Other

## 2013-06-07 ENCOUNTER — Encounter (HOSPITAL_COMMUNITY): Payer: Medicare Other

## 2013-06-09 ENCOUNTER — Encounter (HOSPITAL_COMMUNITY): Payer: Medicare Other

## 2013-06-09 ENCOUNTER — Ambulatory Visit
Admission: RE | Admit: 2013-06-09 | Discharge: 2013-06-09 | Disposition: A | Discharge status: Home / Self Care | Payer: Medicare Other | Source: Ambulatory Visit | Attending: Neurology | Admitting: Neurology

## 2013-06-09 DIAGNOSIS — G4730 Sleep apnea, unspecified: Secondary | ICD-10-CM

## 2013-06-09 DIAGNOSIS — R413 Other amnesia: Secondary | ICD-10-CM

## 2013-06-09 DIAGNOSIS — G4700 Insomnia, unspecified: Secondary | ICD-10-CM

## 2013-06-09 DIAGNOSIS — G9781 Other intraoperative complications of nervous system: Secondary | ICD-10-CM

## 2013-06-12 ENCOUNTER — Encounter (HOSPITAL_COMMUNITY): Payer: Medicare Other

## 2013-06-13 ENCOUNTER — Telehealth: Payer: Self-pay | Admitting: Neurology

## 2013-06-13 NOTE — Telephone Encounter (Signed)
Left message for patient to call and reschedule 09/01/13 appointment per Dr. Oliva Bustard schedule.

## 2013-06-14 ENCOUNTER — Encounter (HOSPITAL_COMMUNITY): Payer: Medicare Other

## 2013-06-14 ENCOUNTER — Encounter: Payer: Self-pay | Admitting: Neurology

## 2013-06-19 ENCOUNTER — Encounter (HOSPITAL_COMMUNITY): Payer: Medicare Other

## 2013-06-19 ENCOUNTER — Ambulatory Visit (INDEPENDENT_AMBULATORY_CARE_PROVIDER_SITE_OTHER): Payer: Medicare Other | Admitting: Pharmacist

## 2013-06-19 DIAGNOSIS — I359 Nonrheumatic aortic valve disorder, unspecified: Secondary | ICD-10-CM

## 2013-06-19 DIAGNOSIS — Z954 Presence of other heart-valve replacement: Secondary | ICD-10-CM

## 2013-06-19 DIAGNOSIS — Z7901 Long term (current) use of anticoagulants: Secondary | ICD-10-CM

## 2013-06-19 DIAGNOSIS — I350 Nonrheumatic aortic (valve) stenosis: Secondary | ICD-10-CM

## 2013-06-19 DIAGNOSIS — I4891 Unspecified atrial fibrillation: Secondary | ICD-10-CM

## 2013-06-19 LAB — POCT INR: INR: 3.7

## 2013-06-20 ENCOUNTER — Telehealth: Payer: Self-pay | Admitting: Neurology

## 2013-06-20 NOTE — Telephone Encounter (Signed)
Patient's daughter called upset with appointment change with Dr. Vickey Huger. She requests her father be put on a wait list.

## 2013-06-21 ENCOUNTER — Encounter (HOSPITAL_COMMUNITY): Payer: Medicare Other

## 2013-06-23 ENCOUNTER — Encounter (HOSPITAL_COMMUNITY): Payer: Medicare Other

## 2013-06-26 ENCOUNTER — Encounter (HOSPITAL_COMMUNITY): Payer: Medicare Other

## 2013-06-26 NOTE — Telephone Encounter (Signed)
Spoke to daughter and relayed MRI results.  She will discuss with doctor on patient's return visit in February.

## 2013-06-26 NOTE — Telephone Encounter (Signed)
sched sooner, daughter requesting MRI results

## 2013-06-28 ENCOUNTER — Ambulatory Visit (INDEPENDENT_AMBULATORY_CARE_PROVIDER_SITE_OTHER): Payer: Medicare Other

## 2013-06-28 ENCOUNTER — Encounter (HOSPITAL_COMMUNITY): Payer: Medicare Other

## 2013-06-28 DIAGNOSIS — G4700 Insomnia, unspecified: Secondary | ICD-10-CM

## 2013-06-28 DIAGNOSIS — G9781 Other intraoperative complications of nervous system: Secondary | ICD-10-CM

## 2013-06-28 DIAGNOSIS — IMO0002 Reserved for concepts with insufficient information to code with codable children: Secondary | ICD-10-CM

## 2013-06-28 DIAGNOSIS — R413 Other amnesia: Secondary | ICD-10-CM

## 2013-06-28 DIAGNOSIS — G4730 Sleep apnea, unspecified: Secondary | ICD-10-CM

## 2013-06-28 DIAGNOSIS — F0280 Dementia in other diseases classified elsewhere without behavioral disturbance: Secondary | ICD-10-CM

## 2013-06-28 DIAGNOSIS — G931 Anoxic brain damage, not elsewhere classified: Secondary | ICD-10-CM

## 2013-06-30 ENCOUNTER — Encounter (HOSPITAL_COMMUNITY): Payer: Medicare Other

## 2013-06-30 NOTE — Progress Notes (Signed)
Quick Note:  Left message for patient with MRI results, per Dr. Vickey Hugerohmeier. ______

## 2013-07-03 ENCOUNTER — Encounter (HOSPITAL_COMMUNITY): Payer: Medicare Other

## 2013-07-05 ENCOUNTER — Ambulatory Visit (INDEPENDENT_AMBULATORY_CARE_PROVIDER_SITE_OTHER): Payer: Medicare Other | Admitting: *Deleted

## 2013-07-05 ENCOUNTER — Telehealth: Payer: Self-pay | Admitting: *Deleted

## 2013-07-05 ENCOUNTER — Encounter (HOSPITAL_COMMUNITY): Payer: Medicare Other

## 2013-07-05 DIAGNOSIS — I4891 Unspecified atrial fibrillation: Secondary | ICD-10-CM

## 2013-07-05 DIAGNOSIS — Z954 Presence of other heart-valve replacement: Secondary | ICD-10-CM

## 2013-07-05 DIAGNOSIS — I359 Nonrheumatic aortic valve disorder, unspecified: Secondary | ICD-10-CM

## 2013-07-05 DIAGNOSIS — Z7901 Long term (current) use of anticoagulants: Secondary | ICD-10-CM

## 2013-07-05 DIAGNOSIS — I350 Nonrheumatic aortic (valve) stenosis: Secondary | ICD-10-CM

## 2013-07-05 LAB — POCT INR: INR: 3.1

## 2013-07-05 NOTE — Telephone Encounter (Signed)
Called and LM for patient on home voicemail, tried mobile but no voicemail had been setup yet.  Asked patient to call me back to discuss results and Dr. Oliva Bustardohmeier's recommendations.  Dr. Vickey Hugerohmeier has recommended the patient try a home sleep test as he achieved no sleep during his first visit.  Jasmine DecemberSharon contacted his [employer] company and they are going to cover the test at 85%, he will be responsible for 15% coinsurance amount.  Results were scanned into the system and forwarded to Willis ModenaSue Drinkard, FNP.  A copy will also be mailed to the patient.

## 2013-07-06 ENCOUNTER — Encounter: Payer: Self-pay | Admitting: *Deleted

## 2013-07-07 ENCOUNTER — Encounter (HOSPITAL_COMMUNITY): Payer: Medicare Other

## 2013-07-10 ENCOUNTER — Encounter (HOSPITAL_COMMUNITY): Payer: Medicare Other

## 2013-07-11 NOTE — Telephone Encounter (Signed)
Called again today as patient's spouse had called back but we missed each other, left another message and explained I needed to schedule time for home sleep test setup.

## 2013-07-12 ENCOUNTER — Encounter (HOSPITAL_COMMUNITY): Payer: Medicare Other

## 2013-07-12 NOTE — Telephone Encounter (Signed)
Patient called today and we discussed Dr. Oliva Bustardohmeier's request for him to do a home sleep test as his in lab test showed no sleep.  He agreed this would hopefully give us some information.  He is willing to do the study.  He explains he is getting through a cold at this time and I advised that it would be best to wait to schedule this test once he is feeling back to normal as the upper respiratory infection would affect his breathing and his sleep potentially.  He is hoping by Friday he may be doing much better.  He understands he will call me when he is ready to do test, hopefully Friday or beginning of next week.

## 2013-07-14 ENCOUNTER — Encounter (HOSPITAL_COMMUNITY): Payer: Medicare Other

## 2013-07-17 ENCOUNTER — Telehealth: Payer: Self-pay | Admitting: *Deleted

## 2013-07-17 ENCOUNTER — Encounter: Payer: Medicare Other | Admitting: *Deleted

## 2013-07-17 ENCOUNTER — Encounter (HOSPITAL_COMMUNITY): Payer: Medicare Other

## 2013-07-17 NOTE — Telephone Encounter (Signed)
Message copied by Daryll DrownHEUGLY, Peggy Loge B on Mon Jul 17, 2013  1:16 PM ------      Message from: Waldron LabsLILLY, KISSA K      Created: Mon Jul 17, 2013 12:08 PM      Regarding: HST       Pt calling the office to set up appt for HST - please return his call to [phone] ------

## 2013-07-17 NOTE — Telephone Encounter (Signed)
Patient called in and stated he is feeling better so he is ready to do the home sleep test Dr. Vickey Hugerohmeier prescribed. We scheduled him for Friday 07/21/13 at 3:00 PM.  This appt was entered on my schedule.

## 2013-07-19 ENCOUNTER — Encounter (HOSPITAL_COMMUNITY): Payer: Medicare Other

## 2013-07-20 ENCOUNTER — Ambulatory Visit (INDEPENDENT_AMBULATORY_CARE_PROVIDER_SITE_OTHER): Payer: Medicare Other | Admitting: *Deleted

## 2013-07-20 DIAGNOSIS — I4891 Unspecified atrial fibrillation: Secondary | ICD-10-CM

## 2013-07-20 DIAGNOSIS — I350 Nonrheumatic aortic (valve) stenosis: Secondary | ICD-10-CM

## 2013-07-20 DIAGNOSIS — Z7901 Long term (current) use of anticoagulants: Secondary | ICD-10-CM

## 2013-07-20 DIAGNOSIS — I359 Nonrheumatic aortic valve disorder, unspecified: Secondary | ICD-10-CM

## 2013-07-20 DIAGNOSIS — Z954 Presence of other heart-valve replacement: Secondary | ICD-10-CM

## 2013-07-20 DIAGNOSIS — Z5181 Encounter for therapeutic drug level monitoring: Secondary | ICD-10-CM | POA: Insufficient documentation

## 2013-07-20 LAB — POCT INR: INR: 2.3

## 2013-07-21 ENCOUNTER — Ambulatory Visit (INDEPENDENT_AMBULATORY_CARE_PROVIDER_SITE_OTHER): Payer: Medicare Other | Admitting: *Deleted

## 2013-07-21 ENCOUNTER — Encounter: Payer: Self-pay | Admitting: *Deleted

## 2013-07-21 ENCOUNTER — Encounter (HOSPITAL_COMMUNITY): Payer: Medicare Other

## 2013-07-21 VITALS — HR 78

## 2013-07-21 DIAGNOSIS — G4733 Obstructive sleep apnea (adult) (pediatric): Secondary | ICD-10-CM

## 2013-07-21 NOTE — Sleep Study (Signed)
Patient arrives for HST instruction.  Patient is given written instructions, picture instructions, and a demonstration on how to use HST unit.  All questions / concerns were addressed by technologist.  Financial responsibility was explained.  Follow up information was given to patient regarding how results would be received.   He plans to use the unit with Oxygen therapy (he has been on nocturnal oxygen for the past year) - he understands depending on the outcome of this result, Dr. Vickey Hugerohmeier may ask him to repeat it at some point without oxygen.  He will use the unit for 1+ night over the weekend, plans to return it on Wednesday morning when [facility], he meets with Dr. Vickey Hugerohmeier for revisit on Friday 07/28/13 where he will discuss the results of the HST.  HST WAS APPROVED FOR THIS PATIENT WITH COMORBIDITIES BECAUSE HE WAS UNABLE TO SLEEP AT ALL IN THE SLEEP LAB, WE ARE HOPING TO GAIN SOME INFORMATION FROM HOME TESTING THAT WE WERE UNABLE TO SEE HERE IN THE SLEEP LAB.

## 2013-07-24 ENCOUNTER — Encounter (HOSPITAL_COMMUNITY): Payer: Medicare Other

## 2013-07-26 ENCOUNTER — Encounter (HOSPITAL_COMMUNITY): Payer: Medicare Other

## 2013-07-28 ENCOUNTER — Other Ambulatory Visit: Payer: Self-pay | Admitting: *Deleted

## 2013-07-28 ENCOUNTER — Encounter (INDEPENDENT_AMBULATORY_CARE_PROVIDER_SITE_OTHER): Payer: Self-pay

## 2013-07-28 ENCOUNTER — Encounter: Payer: Self-pay | Admitting: Neurology

## 2013-07-28 ENCOUNTER — Ambulatory Visit (INDEPENDENT_AMBULATORY_CARE_PROVIDER_SITE_OTHER): Payer: Medicare Other | Admitting: Neurology

## 2013-07-28 ENCOUNTER — Encounter (HOSPITAL_COMMUNITY): Payer: Medicare Other

## 2013-07-28 VITALS — BP 127/72 | HR 98 | Resp 18 | Ht 64.5 in | Wt 183.0 lb

## 2013-07-28 DIAGNOSIS — G4733 Obstructive sleep apnea (adult) (pediatric): Secondary | ICD-10-CM

## 2013-07-28 DIAGNOSIS — F03918 Unspecified dementia, unspecified severity, with other behavioral disturbance: Secondary | ICD-10-CM

## 2013-07-28 DIAGNOSIS — I679 Cerebrovascular disease, unspecified: Secondary | ICD-10-CM

## 2013-07-28 DIAGNOSIS — G4720 Circadian rhythm sleep disorder, unspecified type: Secondary | ICD-10-CM

## 2013-07-28 DIAGNOSIS — I6789 Other cerebrovascular disease: Secondary | ICD-10-CM

## 2013-07-28 DIAGNOSIS — F0391 Unspecified dementia with behavioral disturbance: Secondary | ICD-10-CM

## 2013-07-28 MED ORDER — TRAZODONE HCL 50 MG PO TABS: 50.0000 mg | ORAL_TABLET | Freq: Every day | ORAL | Status: DC

## 2013-07-28 NOTE — Progress Notes (Signed)
Error

## 2013-07-28 NOTE — Addendum Note (Signed)
Addended by: Melvyn NovasHMEIER, Swain Acree on: 07/28/2013 03:57 PM   Modules accepted: Orders

## 2013-07-28 NOTE — Sleep Study (Signed)
See media tab for full, signed report  Out of Center Sleep Test (OCST) Report Patient: Adam [name]. Imran Procedure #: [id number]  DOB:  [birth year] Date:  07/21/13  MR# [id number] Referring MD:  Jarome Matin, MD                              Willis Modena, FNP   INDICATION FOR HOME SLEEP TESTING:  This patient had an in lab attended polysomnogram on 06/28/2013 in which no sleep occurred after 369.5 minutes of recording time.  The patient expressed that he has never had that happen before - even though he feels he has insomnia.  Dr. Vickey Huger recommended to attempt home sleep testing to see if we could get some information regarding potential sleep apnea. CLINICAL HISTORY:  Hypertension, CAD, Atrial flutter, Atrial fibrillation, Atrial tachycardia, Aortic stenosis, COPD - former 1 PPD smoker x 49 years, Diabetes Mellitus, Dementia following hypoxic-ischemic injury, Hyperlipidemia, Dyspnea, Obesity, heart valve replacement, DJD, DDD, spinal stenosis of lumbar region, Anxiety, Depression, long term anticoagulation therapy, memory impairment, sleep apnea, daytime fatigue, sleepiness and loss of interest. PHYSICAL EXAMINATION:  neck circumference 18", BMI 31.0, room air resting SpO2 measured 90%, Mallampati class 4 SLEEP SYMPTOMS:  insomnia (uses benzodiazepines for sleep induction), patient denies excessive daytime sleepiness and Epworth score is 1/24 - his spouse endorsed the Epworth scale for him at 20/24, snoring, napping during the day often for several hours,  MEDICATIONS: Aspirin, Pletal, Warfarin, Albuterol inhaler, Flonase nasal spray, Humalog insulin, Metformin, Lopressor, Losartan, Zocor, Flomax, Effexor, Ambien, Ativan, Soma, patient reports oxygen therapy at night - he states he thinks "they wanted him to wear it all the time but they knew he wouldn't". TECHNICAL SPECIFICATIONS AND SCORING CRITERIA:  This is a 5 channel digital Apnea Link-Plus OCST performed in the patient's home.  Data acquisition and  analysis were performed using the ResMed Apnea Link software system.  Snore and respiratory flow (nasal pressure) signals were sampled at 100 Hz.  Respiratory effort 10 Hz, and Nonin TM pulse & Oximetry at 1 Hz.  SpO2 averaging was less than or equal to 3 seconds @ 80 beats per minute.  This is a standard 5 channel montage with one channel for nasal pressure flow transducer, one for thoracic respiratory effort, one for snore, one for pulse and one for SpO2.In accordance with AASM standards, apneas were scored during any drops in nasal pressure transducer excursion by greater than or equal to 90% of baseline lasting 10 seconds or longer.  Hypopneas were scored during any drops in nasal pressure transducer excursion by greater than or equal to 30% of baseline lasting 10 seconds or longer in association with a minimum SpO2 desaturation of 4% from pre-event baseline. RECORDING OUTCOMES:  See attached Report with screen shots - patient called and needed additional instructions during the night, he also reported when he returned the unit that he felt like he had trouble using it.  This study was performed with supplemental O2 in place as currently prescribed.  TECHNICAL ACCURACY OF TEST:  Adequate for interpretation -Data manually scored by [facility], RPSGT.  Several record attempts were merged together as if unit had been turned off and on several times.  Some episodes of reduced effort where belt may not have been fitting well.  Some episodes where oximeter probe came off during sleep - but still plenty of data for review. IMPRESSION: Evidence of mild sleep apnea with an AHI  of 10/hr and oxygen desaturation index of 12/hr.  Because the patient was wearing oxygen therapy at the time of his test, this test may underestimate the true severity of his condition.  High variability in pulse rate is noted.  Prolonged periods of desaturations with a total desaturation time of over 180 minutes further indicate sleep  apnea.   DIAGNOSIS:  327.23; Obstructive Sleep Apnea RECOMMENDATIONS:  Return for an in lab full night titration would be preferred, especially as this patient shows some "Cheyne-Stokes Respirations probable" on portions of his home sleep test.  However this patient did not sleep when seen in the sleep lab on 06/28/13.  For that reason, I will consider ordering an auto-titrating CPAP with a pressure limit of 8 cm H2O and beginning at 5 cm H2O for 30 days.  O2 therapy will be continued and can be bled into CPAP.  The patient returns to see me in the clinic tomorrow and we will discuss his results in detail at that time.  I certify that I have reviewed the entire raw data recording prior to the issuance of this report in accordance with the Standards of Accreditation of the American Academy of Sleep Medicine (AASM)     _______________________________________________________                           Date signed:  07/27/13 Melvyn Novasarmen Dohmeier, MD Diplomat, American Board of Neurology  Diplomat, American Board of Sleep Medicine Member, American Academy of Sleep Medicine

## 2013-07-28 NOTE — Progress Notes (Signed)
[facility]  Provider:  Melvyn [name], M D  Referring Provider: Jarome Matin, MD Primary Care Physician:  Adam Fillers, MD  Chief Complaint  Adam [name] presents with  . Follow-up    Room 11  . Sleep Apnea    MMSE 22/30   AFT- 7    HPI:  Adam Adam [name] is Adam 79 y.o. male , Adam retired Teacher, early years/pre . He  is seen here as Adam referral   from Dr. Eloise [name] for Adam sleep consultation.  Adam Adam [name] underwent Adam sleep study on 06-28-13, he has been using benzodiazepines for Adam long time for sleep induction but felt not excessively daytime sleepy. He had Adam large neck circumference which would cause him at higher risk of sleep apnea, and Adam BMI of 31. The sleep study was not successful as the Adam [name] was unable to sleep in spite of having taken his medication. So he felt that he was not paradoxically stimulated by the sleep aid. Based on Adam I have asked for his insurance to permit an exception for Adam home sleep test.  Based on the additional complaint of memory loss an MRI of the brain have been obtained dated 06-09-13 which showed moderate changes of chronic microvascular changes and generalized cerebral atrophy.   We also obtained an Mini-Mental status exam today on  Adam Adam [name], Adam retired Teacher, early years/pre,  Who scored  2230 points. Adam  Montral cognitive assessment test was aborted after the visual spatial questions were not been successfully solved. He has no REM BD according to his spouse. He has moderate dementia based on these test results.       Last visit note:   Adam Adam [name] is Adam Adam [name] ,  Adam Adam [name], Adam Adam [name] , who was referred by Dr. Jarold [name] for evaluation of insomnia, snoring, and Adam sleep disorder that seems to have evolved since by bypass surgery 2004 or 2003   And progressed after he in March 2014  an aortic valve repair by Dr. Laneta Adam [name].  Since surgery he feels more fatigued and short of breath, sluggish and forgetful. During cardiac rehab  he made progress but he stopped the exercises.   I reviewed his medications, he confirmed that he takes these as indicated below.  He goes to bed at the same time week days and week ends. He is going to sleep at midnight, his wife will already be in bed by 10 PM. He will have trouble initiating sleep, denies having slept on sofa or recliner, he watches TV in the bedroom, and disturbes his wife. He will need Ambien to initiate sleep, will go to the bedroom some nights 4-5 times. It is off all nocturnal sleep time at about 6 hours. He has always awoken spontaneously in the morning he does not need an alarm at fax 1. His wife reports that he actually sleeps through the alarm until he wakes up spontaneously- between 11 and 12 in the morning. The couples adult daughter lives with the Adam Adam [name]. His wife endorsed the Epworth score for him at 20 points, FSS at 50 points.  Adam Adam [name] responses took Adam very long time, he appears  bradykinetik and bradyphrenic.  He has lost the ability to screen the answering machine on his phone and to use his camera, apraxia.    He is short of breath while speaking to me. His  voice is raspy . His wife states here he is now napping throughout the day, often for several hours. He is  snoring loudly and she witnessed some crescendo snoring, raising suspicions for OSA.  Adam Adam [name] reports he has several bathroom breaks Adam day, attributed Adam to age, he has morning headaches. He has no REM BD, no sleep walking. But he is often confused in the morning. He has lost interest in reading  , conversations he reads   short magazine articles. He watches more TV. His personality has changed since he is sleepier or  because of the sleepiness. He  never bounced back from surgery.      Review of Systems: Out of Adam complete 14 system review, the Adam [name] complains of only the following symptoms, and all other reviewed systems are negative.  Epworth 1- versus 20 points, raspy voice, slowed motion,  stumbled and has fallen in his yard once.  Legs " buckle"/  History   Social History  . Marital Status: Adam Adam [name]    Spouse Name: Adam Adam [name]    Number of Children: 5  . Years of Education: college   Occupational History  . pharmacist     retired   Social History Main Topics  . Smoking status: Former Smoker -- 1.00 packs/day for 49 years    Quit date: 08/21/2001  . Smokeless tobacco: Never Used     Comment: started at age 89.   [name] Alcohol Use: No     Comment: quit 15 yrs ago  . Drug Use: No  . Sexual Activity: No   Other Topics Concern  . 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   Adam [name] has two children and his wife has three children.   Adam [name] has Adam college education.   Adam [name] is right- handed.   Adam [name] does not drink any caffeine.    He is Adam retired Teacher, early years/pre.    Family History  Problem Relation Age of Onset  . Heart disease Father   . Heart disease Paternal Grandfather   . Stroke Mother   . Rheum arthritis Mother     Past Medical History  Diagnosis Date  . Hypertension   . Hyperlipidemia   . Type II or unspecified type diabetes mellitus without mention of complication, not stated as uncontrolled   . Nephrolithiasis   . Atrial flutter     s/p CTI ablation by Dr Adam Adam [name] 1/12  . CAD (coronary artery disease)     s/p CABG x 5 2003 by Dr Adam Adam [name]  . Aortic stenosis, moderate     moderate to severe per echo 03/2012 with AVR in March of 2014  . DJD (degenerative joint disease)   . DDD (degenerative disc disease)   . Spinal stenosis of lumbar region   . Bifascicular block   . Atrial tachycardia   . Anxiety   . Depression   . S/P AVR (aortic valve replacement)   . OSA (obstructive sleep apnea)   . 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 Cabg  2003    by Dr Adam Adam [name]  . Anal fissure repair    . Leg surgery      right  .  Trigger finger release      bilat.  . Tonsillectomy    . Back surgery  2010  . Atrial ablation surgery  1/12    atrial flutter ablation by Dr Adam Adam [name]  . Cardioversion  04/07/2012    Procedure: CARDIOVERSION;  Surgeon: Wendall Stade, MD;  Location: [facility] ENDOSCOPY;  Service: Cardiovascular;  Laterality: N/Adam;  . Coronary artery bypass graft      10 yrs ago   03  . Aortic valve replacement N/Adam 09/01/2012    Procedure: AORTIC VALVE REPLACEMENT (AVR);  Surgeon: Alleen BorneBryan K Bartle, MD;  [facility];  Service: Open Heart Surgery;  Laterality: N/Adam;  . Intraoperative transesophageal echocardiogram N/Adam 09/01/2012    Procedure: INTRAOPERATIVE TRANSESOPHAGEAL ECHOCARDIOGRAM;  Surgeon: Alleen BorneBryan K Bartle, MD;  [facility];  Service: Open Heart Surgery;  Laterality: N/Adam;    Current Outpatient Prescriptions  Medication Sig Dispense Refill  . albuterol (PROVENTIL HFA;VENTOLIN HFA) 108 (90 BASE) MCG/ACT inhaler Inhale 2 puffs into the lungs every 6 (six) hours as needed for wheezing.      [name]. aspirin EC 325 MG EC tablet Take 1 tablet (325 mg total) by mouth daily.  30 tablet    . cilostazol (PLETAL) 50 MG tablet Take 1 tablet by mouth Twice daily.      . fluticasone (FLONASE) 50 MCG/ACT nasal spray       . insulin glargine (LANTUS) 100 UNIT/ML injection Inject 35 Units into the skin at bedtime.      . insulin lispro (HUMALOG) 100 UNIT/ML injection Inject 5 Units into the skin as needed for high blood sugar.      [name]. LORazepam (ATIVAN) 1 MG tablet Take 1-2 mg by mouth at bedtime as needed.        [name]. losartan (COZAAR) 50 MG tablet Take 1 tablet (50 mg total) by mouth daily.  90 tablet  3  . metFORMIN (GLUCOPHAGE-XR) 500 MG 24 hr tablet Take 1,000 mg by mouth at bedtime.        . metoprolol (LOPRESSOR) 100 MG tablet Take 1 tablet (100 mg total) by mouth 2 (two) times daily.  180 tablet  3  . simvastatin (ZOCOR) 10 MG tablet Take 1 tablet (10 mg total) by mouth at bedtime.  90 tablet  3  . Tamsulosin HCl (FLOMAX) 0.4  MG CAPS Take 0.4 mg by mouth at bedtime.       [name]. venlafaxine (EFFEXOR) 75 MG tablet Take 150mg  in the morning and 75mg  at bedtime.      [name]. warfarin (COUMADIN) 5 MG tablet Take as directed by coumadin clinic  150 tablet  1  . zolpidem (AMBIEN) 5 MG tablet Take 5 mg by mouth at bedtime.       No current facility-administered medications for Adam visit.    Allergies as of 07/28/2013 - Review Complete 07/28/2013  Allergen Reaction Noted  . Doxycycline      Vitals: BP 127/72  Pulse Adam  Resp 18  Ht 5' 4.5" (1.638 m)  Wt 183 lb (83.008 kg)  BMI 30.94 kg/m2 Last Weight:  Wt Readings from Last 1 Encounters:  07/28/13 183 lb (83.008 kg)   Last Height:   Ht Readings from Last 1 Encounters:  07/28/13 5' 4.5" (1.638 m)    Physical exam:  General: The Adam [name] is awake, alert and appears not in acute distress. The Adam [name] is well groomed. Head: Normocephalic, atraumatic. Neck is supple. Mallampati 4 , neck circumference:18 , no TMJ - Cardiovascular:  Regular rate and rhythm , without carotid bruit, and without distended neck veins. Respiratory: Lungs are clear to auscultation. Skin:  Without evidence of edema, or rash, he has had femoral artery disease, leg claudication.  Trunk: BMI is  elevated .  Neurologic exam : The Adam [name] is sluggish, slow  and alert,  oriented to place and time.   Memory subjective  described as impaired . There is an abnormal attention span & concentration ability. He perseverates.   Speech is fluent without  Dysarthria, but with  dysphonia .  Mood and affect are appropriate.  Cranial nerves: Pupils are equal and briskly reactive to light. Funduscopic exam without  evidence of pallor or edema. Extraocular movements  in vertical and horizontal planes intact and without nystagmus. Visual fields by finger perimetry are intact. Hearing to finger rub intact.  Facial sensation intact to fine touch. Facial motor strength is symmetric and tongue  move midline. Left uvula  lower, he has Adam drooping mouth, open while speaking, sitting at rest.   Motor exam:   Normal tone and muscle bulk and symmetric strength in all extremities.  Sensory:  Fine touch, pinprick and vibration were tested in all extremities. Proprioception  Normal.   Coordination: Rapid alternating movements in the fingers/hands is tested and normal.  Finger-to-nose maneuver tested and normal without evidence of ataxia, dysmetria or tremor.  Gait and station: Adam [name] walks without assistive device . Slow ambulation, bend forwards . Stance is wide based . Tandem gait is fragmented. Romberg testing is abnormal.  Deep tendon reflexes: in the  upper and lower extremities are symmetric and intact. Babinski maneuver response is  downgoing.   Assessment:  After physical and neurologic examination, review of laboratory studies, imaging, neurophysiology testing and pre-existing records, assessment is   1)  Vascular dementia versus protracted effect of peri-surgical hypoxemia.  MRI with generalized atrophy and microvascular disease. There are some  features of parkinsonism. There is dementia .Gait instability, masked face, cognitive decline but  Not RLS ,  REM BD , no tremor at rest. Bradyphrenic and slow motion. No REM BD- insomnia proven by PSG - there was no sleep seen in Adam six hour recording.  HOME SLEEP TEST  Followed at [facility] 10 , and RDI of 12 , needing oxygen concentrator at home.   2)   possible Spinal stenosis versus vascular claudication ( femoral artery occlusion). No cramps, but needs to sit down before falling- and than can resume movements after 2-5 minutes.       Plan:  Treatment plan and additional workup :  CPAP at 4-8 cm autotitration. Ordered through [facility] .[name]  He is extremely sleepy according to his wife, sleeping over 10 hour , but staying up at night, napping in the afternoon.

## 2013-07-31 ENCOUNTER — Encounter (HOSPITAL_COMMUNITY): Payer: Medicare Other

## 2013-08-02 ENCOUNTER — Encounter (HOSPITAL_COMMUNITY): Payer: Medicare Other

## 2013-08-03 ENCOUNTER — Encounter: Payer: Self-pay | Admitting: Internal Medicine

## 2013-08-03 ENCOUNTER — Ambulatory Visit (INDEPENDENT_AMBULATORY_CARE_PROVIDER_SITE_OTHER): Payer: Medicare Other | Admitting: Internal Medicine

## 2013-08-03 ENCOUNTER — Ambulatory Visit (INDEPENDENT_AMBULATORY_CARE_PROVIDER_SITE_OTHER): Payer: Medicare Other | Admitting: Pharmacist

## 2013-08-03 VITALS — BP 120/68 | HR 69 | Ht 65.0 in | Wt 185.0 lb

## 2013-08-03 DIAGNOSIS — I452 Bifascicular block: Secondary | ICD-10-CM

## 2013-08-03 DIAGNOSIS — I350 Nonrheumatic aortic (valve) stenosis: Secondary | ICD-10-CM

## 2013-08-03 DIAGNOSIS — I471 Supraventricular tachycardia: Secondary | ICD-10-CM

## 2013-08-03 DIAGNOSIS — Z954 Presence of other heart-valve replacement: Secondary | ICD-10-CM

## 2013-08-03 DIAGNOSIS — Z5181 Encounter for therapeutic drug level monitoring: Secondary | ICD-10-CM

## 2013-08-03 DIAGNOSIS — I4891 Unspecified atrial fibrillation: Secondary | ICD-10-CM

## 2013-08-03 DIAGNOSIS — I359 Nonrheumatic aortic valve disorder, unspecified: Secondary | ICD-10-CM

## 2013-08-03 DIAGNOSIS — I498 Other specified cardiac arrhythmias: Secondary | ICD-10-CM

## 2013-08-03 DIAGNOSIS — I4719 Other supraventricular tachycardia: Secondary | ICD-10-CM

## 2013-08-03 DIAGNOSIS — I2510 Atherosclerotic heart disease of native coronary artery without angina pectoris: Secondary | ICD-10-CM

## 2013-08-03 LAB — POCT INR: INR: 5.6

## 2013-08-03 MED ORDER — METOPROLOL TARTRATE 100 MG PO TABS: 50.0000 mg | ORAL_TABLET | Freq: Two times a day (BID) | ORAL | Status: DC

## 2013-08-03 NOTE — Progress Notes (Signed)
PCP:  Garlan Fillers, MD  The patient presents today for routine electrophysiology followup.  Since his last visit, the patient reports doing reasonably well.  He has had significant decline in memory.  This is his primary concern today.  Today, he denies symptoms of palpitations, chest pain, orthopnea, PND, lower extremity edema, dizziness, presyncope, syncope, or neurologic sequela.  The patient feels that he is tolerating medications without difficulties and is otherwise without complaint today.   Past Medical History  Diagnosis Date  . Hypertension   . Hyperlipidemia   . Type II or unspecified type diabetes mellitus without mention of complication, not stated as uncontrolled   . Nephrolithiasis   . Atrial flutter     s/p CTI ablation by Dr Ladona Ridgel 1/12  . CAD (coronary artery disease)     s/p CABG x 5 2003 by Dr Laneta Simmers  . Aortic stenosis, moderate     moderate to severe per echo 03/2012 with AVR in March of 2014  . DJD (degenerative joint disease)   . DDD (degenerative disc disease)   . Spinal stenosis of lumbar region   . Bifascicular block   . Atrial tachycardia   . Anxiety   . Depression   . S/P AVR (aortic valve replacement)   . OSA (obstructive sleep apnea)   . 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 Cabg  2003    by Dr Laneta Simmers  . Anal fissure repair    . Leg surgery      right  . Trigger finger release      bilat.  . Tonsillectomy    . Back surgery  2010  . Atrial ablation surgery  1/12    atrial flutter ablation by Dr Ladona Ridgel  . Cardioversion  04/07/2012    Procedure: CARDIOVERSION;  Surgeon: Wendall Stade, MD;  Location: [facility] ENDOSCOPY;  Service: Cardiovascular;  Laterality: N/A;  . Coronary artery bypass graft      10 yrs ago   03  . Aortic valve replacement N/A 09/01/2012    Procedure: AORTIC VALVE REPLACEMENT (AVR);  Surgeon: Alleen Borne, MD;  Location: [facility] OR;  Service: Open Heart Surgery;   Laterality: N/A;  . Intraoperative transesophageal echocardiogram N/A 09/01/2012    Procedure: INTRAOPERATIVE TRANSESOPHAGEAL ECHOCARDIOGRAM;  Surgeon: Alleen Borne, MD;  Location: [facility] OR;  Service: Open Heart Surgery;  Laterality: N/A;    Current Outpatient Prescriptions  Medication Sig Dispense Refill  . albuterol (PROVENTIL HFA;VENTOLIN HFA) 108 (90 BASE) MCG/ACT inhaler Inhale 2 puffs into the lungs every 6 (six) hours as needed for wheezing.      [name] aspirin EC 325 MG EC tablet Take 1 tablet (325 mg total) by mouth daily.  30 tablet    . cilostazol (PLETAL) 50 MG tablet Take 1 tablet by mouth Twice daily.      . fluticasone (FLONASE) 50 MCG/ACT nasal spray Place 1-2 sprays into both nostrils daily.       . insulin glargine (LANTUS) 100 UNIT/ML injection Inject 35 Units into the skin at bedtime.      . insulin lispro (HUMALOG) 100 UNIT/ML injection Inject 5 Units into the skin as needed for high blood sugar.      [name] LORazepam (ATIVAN) 1 MG tablet Take 1-2 mg by mouth at bedtime as needed.        [name] losartan (COZAAR) 50 MG tablet Take 1 tablet (50 mg total) by mouth daily.  90 tablet  3  . metFORMIN (GLUCOPHAGE-XR) 500 MG 24 hr tablet Take 1,000 mg by mouth at bedtime.        . metoprolol (LOPRESSOR) 100 MG tablet Take 1 tablet (100 mg total) by mouth 2 (two) times daily.  180 tablet  3  . simvastatin (ZOCOR) 10 MG tablet Take 1 tablet (10 mg total) by mouth at bedtime.  90 tablet  3  . Tamsulosin HCl (FLOMAX) 0.4 MG CAPS Take 0.4 mg by mouth at bedtime.       . traZODone (DESYREL) 50 MG tablet Take 1 tablet (50 mg total) by mouth at bedtime.  30 tablet  5  . venlafaxine (EFFEXOR) 75 MG tablet Take 150mg  in the morning and 75mg  at bedtime.      [name] warfarin (COUMADIN) 5 MG tablet Take as directed by coumadin clinic  150 tablet  1  . zolpidem (AMBIEN) 5 MG tablet Take 5 mg by mouth at bedtime.       No current facility-administered medications for this visit.    Allergies  Allergen Reactions   . Doxycycline     REACTION: swollen tongue    History   Social History  . Marital Status: Married    Spouse Name: Lynnette    Number of Children: 5  . Years of Education: college   Occupational History  . pharmacist     retired   Social History Main Topics  . Smoking status: Former Smoker -- 1.00 packs/day for 49 years    Quit date: 08/21/2001  . Smokeless tobacco: Never Used     Comment: started at age 31.   [name] Alcohol Use: No     Comment: quit 15 yrs ago  . Drug Use: No  . Sexual Activity: No   Other Topics Concern  . 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   Patient has two children and his wife has three children.   Patient has a college education.   Patient is right- handed.   Patient does not drink any caffeine.    He is a retired Teacher, early years/pre.    Family History  Problem Relation Age of Onset  . Heart disease Father   . Heart disease Paternal Grandfather   . Stroke Mother   . Rheum arthritis Mother     Physical Exam: Filed Vitals:   08/03/13 1224  BP: 120/68  Pulse: 69  Height: 5\' 5"  (1.651 m)  Weight: 185 lb (83.915 kg)    GEN- The patient is well appearing, alert and oriented x 3 today.   Head- normocephalic, atraumatic Eyes-  Sclera clear, conjunctiva pink Ears- hearing intact Oropharynx- clear Neck- supple, no JVP Lymph- no cervical lymphadenopathy Lungs- Clear to ausculation bilaterally, normal work of breathing Heart- Regular rate and rhythm, crisp A2 GI- soft, NT, ND, + BS Extremities- no clubbing, cyanosis, or edema  ekg today reveals sinus rhythm 69 bpm, PR 190, RBBB, LAHB  Assessment and Plan:  1. Atrial tachycardia Asymptomatic/ resolved Given bifascicular block, I am concerned about high dose metoprolol long term I will therefore decrease metoprolol to 50mg  BID today  2. Bifascicular block Asymptomatic No indication for pacing Decrease metoprolol as  above  3. S/p AVR Stable No change required today  4. Atria arrhythmias On coumadin and ASA Would stop ASA at this time  5. CAD Stable No change required today  Return in 6 months to see Lawson Fiscal I will  see in a year

## 2013-08-03 NOTE — Patient Instructions (Addendum)
Your physician wants you to follow-up in :6 months with Norma FredricksonLori Gerhardt and 12 months with Dr Jacquiline DoeAllred You will receive a reminder letter in the mail two months in advance. If you don't receive a letter, please call our office to schedule the follow-up appointment.  Your physician has recommended you make the following change in your medication:  1) Decrease Metoprolol to 50mg  twice daily

## 2013-08-04 ENCOUNTER — Encounter (HOSPITAL_COMMUNITY): Payer: Medicare Other

## 2013-08-07 ENCOUNTER — Encounter (HOSPITAL_COMMUNITY): Payer: Medicare Other

## 2013-08-09 ENCOUNTER — Ambulatory Visit (INDEPENDENT_AMBULATORY_CARE_PROVIDER_SITE_OTHER): Payer: Medicare Other | Admitting: Pharmacist

## 2013-08-09 ENCOUNTER — Ambulatory Visit (INDEPENDENT_AMBULATORY_CARE_PROVIDER_SITE_OTHER): Payer: Medicare Other | Admitting: Pulmonary Disease

## 2013-08-09 ENCOUNTER — Encounter: Payer: Self-pay | Admitting: Pulmonary Disease

## 2013-08-09 ENCOUNTER — Encounter (HOSPITAL_COMMUNITY): Payer: Medicare Other

## 2013-08-09 VITALS — BP 118/62 | HR 66 | Temp 97.8°F | Ht 65.0 in | Wt 188.0 lb

## 2013-08-09 DIAGNOSIS — I350 Nonrheumatic aortic (valve) stenosis: Secondary | ICD-10-CM

## 2013-08-09 DIAGNOSIS — I359 Nonrheumatic aortic valve disorder, unspecified: Secondary | ICD-10-CM

## 2013-08-09 DIAGNOSIS — J449 Chronic obstructive pulmonary disease, unspecified: Secondary | ICD-10-CM

## 2013-08-09 DIAGNOSIS — Z5181 Encounter for therapeutic drug level monitoring: Secondary | ICD-10-CM

## 2013-08-09 DIAGNOSIS — Z954 Presence of other heart-valve replacement: Secondary | ICD-10-CM

## 2013-08-09 DIAGNOSIS — I4891 Unspecified atrial fibrillation: Secondary | ICD-10-CM

## 2013-08-09 LAB — POCT INR: INR: 1.2

## 2013-08-09 MED ORDER — ALBUTEROL SULFATE HFA 108 (90 BASE) MCG/ACT IN AERS: 2.0000 | INHALATION_SPRAY | Freq: Four times a day (QID) | RESPIRATORY_TRACT | Status: AC | PRN

## 2013-08-09 NOTE — Patient Instructions (Signed)
Continue with albuterol as needed, but let us know if you are having increased symptoms and wish to get back on a maintenance medication. Keep working on weight reduction.  followup with me again in 6mos.

## 2013-08-09 NOTE — Assessment & Plan Note (Signed)
The patient feels that he is doing well and maintaining a stable baseline on albuterol only as needed. He is not having acute exacerbations or increased symptoms, and therefore I think it is acceptable for him not to be on a maintenance regimen. I have asked him to continue working on weight reduction and some type of conditioning program. I will see him back in 6 months if he is doing well.

## 2013-08-09 NOTE — Progress Notes (Signed)
   Subjective:    Patient ID: Adam [name], male    DOB: [birth year], 79 y.o.   MRN: [id number]  HPI The patient comes in today for followup of his known moderate COPD. He is currently not using a maintenance bronchodilator regimen, and rarely requires his albuterol as needed. He feels that his breathing is at a reasonable baseline, and has not had an acute exacerbation. He did receive his flu shot this past fall.   Review of Systems  Constitutional: Negative for fever and unexpected weight change.  HENT: Negative for congestion, dental problem, ear pain, nosebleeds, postnasal drip, rhinorrhea, sinus pressure, sneezing, sore throat and trouble swallowing.   Eyes: Negative for redness and itching.  Respiratory: Negative for cough, chest tightness, shortness of breath and wheezing.   Cardiovascular: Negative for palpitations and leg swelling.  Gastrointestinal: Negative for nausea and vomiting.  Genitourinary: Negative for dysuria.  Musculoskeletal: Negative for joint swelling.  Skin: Negative for rash.  Neurological: Negative for headaches.  Hematological: Does not bruise/bleed easily.  Psychiatric/Behavioral: Negative for dysphoric mood. The patient is not nervous/anxious.        Objective:   Physical Exam Overweight male in no acute distress Nose without purulence or discharge noted Neck without lymphadenopathy or thyromegaly Chest with mildly decreased breath sounds, no active wheezing or rhonchi Cardiac exam with regular rate and rhythm Lower extremities with mild ankle edema, no cyanosis Alert and oriented, moves all 4 extremities.       Assessment & Plan:

## 2013-08-11 ENCOUNTER — Encounter (HOSPITAL_COMMUNITY): Payer: Medicare Other

## 2013-08-14 ENCOUNTER — Encounter (HOSPITAL_COMMUNITY): Payer: Medicare Other

## 2013-08-16 ENCOUNTER — Encounter (HOSPITAL_COMMUNITY): Payer: Medicare Other

## 2013-08-18 ENCOUNTER — Encounter (HOSPITAL_COMMUNITY): Payer: Medicare Other

## 2013-08-18 ENCOUNTER — Ambulatory Visit (INDEPENDENT_AMBULATORY_CARE_PROVIDER_SITE_OTHER): Payer: Medicare Other | Admitting: Pharmacist

## 2013-08-18 DIAGNOSIS — Z5181 Encounter for therapeutic drug level monitoring: Secondary | ICD-10-CM

## 2013-08-18 DIAGNOSIS — I350 Nonrheumatic aortic (valve) stenosis: Secondary | ICD-10-CM

## 2013-08-18 DIAGNOSIS — I4891 Unspecified atrial fibrillation: Secondary | ICD-10-CM

## 2013-08-18 DIAGNOSIS — I359 Nonrheumatic aortic valve disorder, unspecified: Secondary | ICD-10-CM

## 2013-08-18 DIAGNOSIS — Z954 Presence of other heart-valve replacement: Secondary | ICD-10-CM

## 2013-08-18 LAB — POCT INR: INR: 2.9

## 2013-08-21 ENCOUNTER — Encounter (HOSPITAL_COMMUNITY): Payer: Medicare Other

## 2013-08-23 ENCOUNTER — Encounter (HOSPITAL_COMMUNITY): Payer: Medicare Other

## 2013-08-25 ENCOUNTER — Encounter (HOSPITAL_COMMUNITY): Payer: Medicare Other

## 2013-08-28 ENCOUNTER — Encounter (HOSPITAL_COMMUNITY): Payer: Medicare Other

## 2013-08-30 ENCOUNTER — Encounter (HOSPITAL_COMMUNITY): Payer: Medicare Other

## 2013-09-01 ENCOUNTER — Ambulatory Visit: Payer: Medicare Other | Admitting: Neurology

## 2013-09-01 ENCOUNTER — Encounter (HOSPITAL_COMMUNITY): Payer: Medicare Other

## 2013-09-04 ENCOUNTER — Encounter: Payer: Self-pay | Admitting: Neurology

## 2013-09-04 ENCOUNTER — Encounter (HOSPITAL_COMMUNITY): Payer: Medicare Other

## 2013-09-06 ENCOUNTER — Ambulatory Visit (INDEPENDENT_AMBULATORY_CARE_PROVIDER_SITE_OTHER): Payer: Medicare Other | Admitting: Pharmacist

## 2013-09-06 ENCOUNTER — Encounter (HOSPITAL_COMMUNITY): Payer: Medicare Other

## 2013-09-06 DIAGNOSIS — Z954 Presence of other heart-valve replacement: Secondary | ICD-10-CM

## 2013-09-06 DIAGNOSIS — I359 Nonrheumatic aortic valve disorder, unspecified: Secondary | ICD-10-CM

## 2013-09-06 DIAGNOSIS — I350 Nonrheumatic aortic (valve) stenosis: Secondary | ICD-10-CM

## 2013-09-06 DIAGNOSIS — Z5181 Encounter for therapeutic drug level monitoring: Secondary | ICD-10-CM

## 2013-09-06 DIAGNOSIS — I4891 Unspecified atrial fibrillation: Secondary | ICD-10-CM

## 2013-09-06 LAB — POCT INR: INR: 3.7

## 2013-09-08 ENCOUNTER — Encounter (HOSPITAL_COMMUNITY): Payer: Medicare Other

## 2013-09-11 ENCOUNTER — Encounter: Payer: Self-pay | Admitting: Neurology

## 2013-09-11 ENCOUNTER — Encounter (HOSPITAL_COMMUNITY): Payer: Medicare Other

## 2013-09-13 ENCOUNTER — Encounter (HOSPITAL_COMMUNITY): Payer: Medicare Other

## 2013-09-15 ENCOUNTER — Other Ambulatory Visit: Payer: Self-pay | Admitting: *Deleted

## 2013-09-15 ENCOUNTER — Encounter (HOSPITAL_COMMUNITY): Payer: Medicare Other

## 2013-09-15 DIAGNOSIS — G4733 Obstructive sleep apnea (adult) (pediatric): Secondary | ICD-10-CM

## 2013-09-18 ENCOUNTER — Encounter (HOSPITAL_COMMUNITY): Payer: Medicare Other

## 2013-09-20 ENCOUNTER — Encounter (HOSPITAL_COMMUNITY): Payer: Medicare Other

## 2013-09-22 ENCOUNTER — Encounter (HOSPITAL_COMMUNITY): Payer: Medicare Other

## 2013-09-25 ENCOUNTER — Ambulatory Visit (INDEPENDENT_AMBULATORY_CARE_PROVIDER_SITE_OTHER): Payer: Medicare Other | Admitting: Pharmacist

## 2013-09-25 ENCOUNTER — Encounter (HOSPITAL_COMMUNITY): Payer: Medicare Other

## 2013-09-25 DIAGNOSIS — I359 Nonrheumatic aortic valve disorder, unspecified: Secondary | ICD-10-CM

## 2013-09-25 DIAGNOSIS — I350 Nonrheumatic aortic (valve) stenosis: Secondary | ICD-10-CM

## 2013-09-25 DIAGNOSIS — Z954 Presence of other heart-valve replacement: Secondary | ICD-10-CM

## 2013-09-25 DIAGNOSIS — Z5181 Encounter for therapeutic drug level monitoring: Secondary | ICD-10-CM

## 2013-09-25 DIAGNOSIS — I4891 Unspecified atrial fibrillation: Secondary | ICD-10-CM

## 2013-09-25 LAB — POCT INR: INR: 4.6

## 2013-09-27 ENCOUNTER — Encounter (HOSPITAL_COMMUNITY): Payer: Medicare Other

## 2013-09-29 ENCOUNTER — Encounter (HOSPITAL_COMMUNITY): Payer: Medicare Other

## 2013-10-02 ENCOUNTER — Encounter (HOSPITAL_COMMUNITY): Payer: Medicare Other

## 2013-10-04 ENCOUNTER — Encounter (HOSPITAL_COMMUNITY): Payer: Medicare Other

## 2013-10-06 ENCOUNTER — Encounter (HOSPITAL_COMMUNITY): Payer: Medicare Other

## 2013-10-09 ENCOUNTER — Ambulatory Visit (INDEPENDENT_AMBULATORY_CARE_PROVIDER_SITE_OTHER): Payer: Medicare Other | Admitting: *Deleted

## 2013-10-09 ENCOUNTER — Encounter (HOSPITAL_COMMUNITY): Payer: Medicare Other

## 2013-10-09 DIAGNOSIS — I350 Nonrheumatic aortic (valve) stenosis: Secondary | ICD-10-CM

## 2013-10-09 DIAGNOSIS — I4891 Unspecified atrial fibrillation: Secondary | ICD-10-CM

## 2013-10-09 DIAGNOSIS — Z954 Presence of other heart-valve replacement: Secondary | ICD-10-CM

## 2013-10-09 DIAGNOSIS — Z5181 Encounter for therapeutic drug level monitoring: Secondary | ICD-10-CM

## 2013-10-09 DIAGNOSIS — I359 Nonrheumatic aortic valve disorder, unspecified: Secondary | ICD-10-CM

## 2013-10-09 LAB — POCT INR: INR: 4.4

## 2013-10-10 ENCOUNTER — Encounter (INDEPENDENT_AMBULATORY_CARE_PROVIDER_SITE_OTHER): Payer: Self-pay

## 2013-10-10 ENCOUNTER — Encounter: Payer: Self-pay | Admitting: Neurology

## 2013-10-10 ENCOUNTER — Ambulatory Visit (INDEPENDENT_AMBULATORY_CARE_PROVIDER_SITE_OTHER): Payer: Medicare Other | Admitting: Neurology

## 2013-10-10 VITALS — BP 129/64 | HR 75 | Resp 19 | Ht 64.5 in | Wt 185.0 lb

## 2013-10-10 DIAGNOSIS — G4733 Obstructive sleep apnea (adult) (pediatric): Secondary | ICD-10-CM

## 2013-10-10 DIAGNOSIS — G4730 Sleep apnea, unspecified: Secondary | ICD-10-CM

## 2013-10-10 DIAGNOSIS — Z9989 Dependence on other enabling machines and devices: Principal | ICD-10-CM

## 2013-10-10 DIAGNOSIS — G4710 Hypersomnia, unspecified: Secondary | ICD-10-CM

## 2013-10-10 NOTE — Progress Notes (Signed)
[facility]  Provider:  Melvyn [name], M D  Referring Provider: Jarome Matin, MD Primary Care Physician:  Adam Fillers, MD  Chief Complaint  Patient presents with  . Follow-up    Room 11  . Sleep Apnea    HPI:  Adam [name] is a 79 y.o. male , a retired Teacher, early years/pre . He  is seen here as a referral   from Dr. Eloise [name] for a sleep follow up:  Adam [name] was unable to initiate any Adam [name] sleep doing has been Lasix study on 06-28-13. After positioning Medicare we were able to obtain a home sleep test. The test was done by the patient used his home oxygen supply it indicated an AHI of 12 but with persistence take tachybradycardia arrhythmias based on this I believe that without oxygen he would have likely had an apnea index more than 100% higher than in his home sleep test on oxygen.  The study was interpreted on 07-27-13 and I ordered an auto-set CPAP in response ,  having a pressure window between 4 and 12 cm water with an EBI of 2 unfortunately his AHI is still 7.9. Review shows that days of high air leak ( over 80 liters) are those of high AHI.  Excluding these , the AHI is 5.5 /hr.  The patient had excellent compliance of 100% over  30 out of 30 days, 8 hours and 13 minutes on average daily use.  He also is less daytime sleepy. He was not aware of how sleepy he was. He noticed that he can actually follow conversations better and is not falling asleep when not stimulated.     Last visit note:   This patient is a pleasant , married, 64 your old right-handed Caucasian gentleman.  He is a retired Teacher, early years/pre , who was referred by Dr. Jarold [name] for evaluation of insomnia, snoring, and a sleep disorder that seems to have evolved since by bypass surgery 2004 or 2003  and progressed after he in March 2014  an aortic valve repair by Dr. Laneta [name].  Since surgery he feels more fatigued and short of breath, sluggish and forgetful. During cardiac rehab he made progress but  he stopped the exercises.  I reviewed his medications, he confirmed that he takes these as indicated below.  He goes to bed at the same time week days and week ends. He is going to sleep at midnight, his wife will already be in bed by 10 PM. He will have trouble initiating sleep, denies having slept on sofa or recliner, he watches TV in the bedroom, and disturbes his wife. He will need Ambien to initiate sleep, will go to the bedroom some nights 4-5 times. It is off all nocturnal sleep time at about 6 hours. He has always awoken spontaneously in the morning he does not need an alarm . His wife reports that he actually sleeps through the alarm until he wakes up spontaneously- between 11 and 12 in the morning. The couples adult daughter lives with the Adam [name]. His wife endorsed the Epworth score for him : at 20 points, FSS at 50 points.  Adam [name] responses took a very long time, he appears  bradykinetik and bradyphrenic.  He has lost the ability to screen the answering machine on his phone and to use his camera, apraxia. His wife has taken over balancing the check book. He is supposed to have dementia following an acute ischemic/hypoxic insult after heart vavle.   He is short of breath  while speaking to me. His  voice is raspy . His wife states here he is now napping throughout the day, often for several hours. He is snoring loudly and she witnessed some crescendo snoring, raising suspicions for OSA.  Adam [name] reports he has several bathroom breaks a day, attributed this to age, he has morning headaches. He has no REM BD, no sleep walking. But he is often confused in the morning. He has lost interest in reading  , conversations he reads   short magazine articles. He watches more TV. His personality has changed since he is sleepier or  because of the sleepiness. He  never bounced back from surgery.  Adam. Eulah [name] underwent a sleep study on 06-28-13, he has been using benzodiazepines for a long time for sleep  induction but felt not excessively daytime sleepy. He had a large neck circumference which would cause him at higher risk of sleep apnea, and a BMI of 31. The sleep study was not successful as the patient was unable to sleep in spite of having taken his medication. So he felt that he was not paradoxically stimulated by the sleep aid. Based on this I have asked for his insurance to permit an exception for a home sleep test.  Based on the additional complaint of memory loss an MRI of the brain have been obtained dated 06-09-13 which showed moderate changes of chronic microvascular changes and generalized cerebral atrophy.  We also obtained an Mini-Mental status exam today on  this patient, a retired Teacher, early years/prepharmacist,  Who scored 22/ 30 points. A  Montral cognitive assessment test was aborted after the visual spatial questions were not been successfully solved. He has no REM BD according to his spouse. He has moderate dementia based on these test results.         Review of Systems: Out of a complete 14 system review, the patient complains of only the following symptoms, and all other reviewed systems are negative.  Epworth per wife 12 and his own assessment  Zero.  History   Social History  . Marital Status: Married    Spouse Name: Adam [name]    Number of Children: 5  . Years of Education: college   Occupational History  . pharmacist     retired   Social History Main Topics  . Smoking status: Former Smoker -- 1.00 packs/day for 49 years    Quit date: 08/21/2001  . Smokeless tobacco: Never Used     Comment: started at age 79.   [name]. Alcohol Use: No     Comment: quit 15 yrs ago  . Drug Use: No  . Sexual Activity: No   Other Topics Concern  . Not on file   Social History Narrative   Patient is married Market researcher(Adam [name]) and lives at home with his wife.   Patient has two children and his wife has three children.   Patient has a college education.   Patient is right- handed.   Patient does not  drink any caffeine.    He is a retired Teacher, early years/prepharmacist.    Family History  Problem Relation Age of Onset  . Heart disease Father   . Heart disease Paternal Grandfather   . Stroke Mother   . Rheum arthritis Mother     Past Medical History  Diagnosis Date  . Hypertension   . Hyperlipidemia   . Type II or unspecified type diabetes mellitus without mention of complication, not stated as uncontrolled   . Nephrolithiasis   .  Atrial flutter     s/p CTI ablation by Dr Ladona Ridgel 1/12  . CAD (coronary artery disease)     s/p CABG x 5 2003 by Dr Adam [name]  . Aortic stenosis, moderate     moderate to severe per echo 03/2012 with AVR in March of 2014  . DJD (degenerative joint disease)   . DDD (degenerative disc disease)   . Spinal stenosis of lumbar region   . Bifascicular block   . Atrial tachycardia   . Anxiety   . Depression   . S/P AVR (aortic valve replacement)   . OSA (obstructive sleep apnea)   . 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 Cabg  2003    by Dr Adam [name]  . Anal fissure repair    . Leg surgery      right  . Trigger finger release      bilat.  . Tonsillectomy    . Back surgery  2010  . Atrial ablation surgery  1/12    atrial flutter ablation by Dr Ladona Ridgel  . Cardioversion  04/07/2012    Procedure: CARDIOVERSION;  Surgeon: Wendall Stade, MD;  Location: [facility] ENDOSCOPY;  Service: Cardiovascular;  Laterality: N/A;  . Coronary artery bypass graft      10 yrs ago   03  . Aortic valve replacement N/A 09/01/2012    Procedure: AORTIC VALVE REPLACEMENT (AVR);  Surgeon: Alleen Borne, MD;  Location: [facility] OR;  Service: Open Heart Surgery;  Laterality: N/A;  . Intraoperative transesophageal echocardiogram N/A 09/01/2012    Procedure: INTRAOPERATIVE TRANSESOPHAGEAL ECHOCARDIOGRAM;  Surgeon: Alleen Borne, MD;  Location: [facility] OR;  Service: Open Heart Surgery;  Laterality: N/A;    Current Outpatient Prescriptions   Medication Sig Dispense Refill  . albuterol (PROVENTIL HFA;VENTOLIN HFA) 108 (90 BASE) MCG/ACT inhaler Inhale 2 puffs into the lungs every 6 (six) hours as needed for wheezing.  1 Inhaler  6  . aspirin EC 325 MG EC tablet Take 1 tablet (325 mg total) by mouth daily.  30 tablet    . cilostazol (PLETAL) 50 MG tablet Take 1 tablet by mouth Twice daily.      . fluticasone (FLONASE) 50 MCG/ACT nasal spray Place 1-2 sprays into both nostrils daily.       . insulin glargine (LANTUS) 100 UNIT/ML injection Inject 35 Units into the skin at bedtime.      . insulin lispro (HUMALOG) 100 UNIT/ML injection Inject 5 Units into the skin as needed for high blood sugar.      [name] LORazepam (ATIVAN) 1 MG tablet Take 1-2 mg by mouth at bedtime as needed.        [name] losartan (COZAAR) 50 MG tablet Take 1 tablet (50 mg total) by mouth daily.  90 tablet  3  . metFORMIN (GLUCOPHAGE-XR) 500 MG 24 hr tablet Take 1,000 mg by mouth at bedtime.        . metoprolol (LOPRESSOR) 100 MG tablet Take 0.5 tablets (50 mg total) by mouth 2 (two) times daily.  90 tablet  3  . simvastatin (ZOCOR) 10 MG tablet Take 1 tablet (10 mg total) by mouth at bedtime.  90 tablet  3  . Tamsulosin HCl (FLOMAX) 0.4 MG CAPS Take 0.4 mg by mouth at bedtime.       . traZODone (DESYREL) 50 MG tablet Take 1 tablet (50 mg total) by mouth at bedtime.  30 tablet  5  . venlafaxine (  EFFEXOR) 75 MG tablet Take 150mg  in the morning and 75mg  at bedtime.      [name]. warfarin (COUMADIN) 5 MG tablet Take as directed by coumadin clinic  150 tablet  1  . zolpidem (AMBIEN) 5 MG tablet Take 5 mg by mouth at bedtime.       No current facility-administered medications for this visit.    Allergies as of 10/10/2013 - Review Complete 10/10/2013  Allergen Reaction Noted  . Doxycycline      Vitals: BP 129/64  Pulse 75  Resp 19  Ht 5' 4.5" (1.638 m)  Wt 185 lb (83.915 kg)  BMI 31.28 kg/m2 Last Weight:  Wt Readings from Last 1 Encounters:  10/10/13 185 lb (83.915 kg)    Last Height:   Ht Readings from Last 1 Encounters:  10/10/13 5' 4.5" (1.638 m)    Physical exam:  General: The patient is awake, alert and appears not in acute distress. The patient is well groomed. Head: Normocephalic, atraumatic. Neck is supple. Mallampati 4 , neck circumference:18 , no TMJ - Cardiovascular:  Regular rate and rhythm , without carotid bruit, and without distended neck veins. Respiratory: Lungs are clear to auscultation. Skin:  Without evidence of edema, or rash, he has had femoral artery disease, leg claudication.  Trunk: BMI is  elevated .  Neurologic exam : The patient is sluggish, slow  and alert, oriented to place and time.   Memory subjective  described as impaired . There is an abnormal attention span & concentration ability. He perseverates.   Speech is fluent without  Dysarthria, but with  dysphonia .  Mood and affect are appropriate.  Cranial nerves: Pupils are equal and briskly reactive to light. Funduscopic exam without  evidence of pallor or edema. Extraocular movements  in vertical and horizontal planes intact and without nystagmus. Visual fields by finger perimetry are intact. Hearing to finger rub intact.  Facial sensation intact to fine touch. Facial motor strength is symmetric and tongue  move midline. Left uvula lower, he has a drooping mouth, open while speaking, sitting at rest.   Motor exam:   Normal tone and muscle bulk and symmetric strength in all extremities.  Sensory:  Fine touch, pinprick and vibration were tested in all extremities. Proprioception  Normal.   Coordination: Rapid alternating movements in the fingers/hands is tested and normal.  Finger-to-nose maneuver tested and normal without evidence of ataxia, dysmetria or tremor.  Gait and station: Patient walks without assistive device . Slow ambulation, bend forwards . Stance is wide based . Tandem gait is fragmented. Romberg testing is abnormal.  Deep tendon reflexes: in the   upper and lower extremities are symmetric and intact. Babinski maneuver response is  downgoing.   Assessment:  After physical and neurologic examination, review of laboratory studies, imaging, neurophysiology testing and pre-existing records, assessment is   0) OSA- treated with CPAP at 11 cm water , auto-set pressure. He has a higher AHI due to air leak. No changes in setting- will ask DME to educate him about  Switching machine of when going to bathroom.   1)  Vascular dementia versus protracted effect of peri-surgical hypoxemia.  MRI with generalized atrophy and microvascular disease. There are some features of parkinsonism.  2)Gait instability, masked face, cognitive decline but not RLS, REM BD , and  no tremor at rest. 2- spinal stenosis, patient declines walker     Plan:   Stay on CPAP , may continue on autoset.

## 2013-10-10 NOTE — Patient Instructions (Signed)
Sleep Apnea  Sleep apnea is disorder that affects a person's sleep. A person with sleep apnea has abnormal pauses in their breathing when they sleep. It is hard for them to get a good sleep. This makes a person tired during the day. It also can lead to other physical problems. There are three types of sleep apnea. One type is when breathing stops for a short time because your airway is blocked (obstructive sleep apnea). Another type is when the brain sometimes fails to give the normal signal to breathe to the muscles that control your breathing (central sleep apnea). The third type is a combination of the other two types.  HOME CARE  · Do not sleep on your back. Try to sleep on your side.  · Take all medicine as told by your doctor.  · Avoid alcohol, calming medicines (sedatives), and depressant drugs.  · Try to lose weight if you are overweight. Talk to your doctor about a healthy weight goal.  Your doctor may have you use a device that helps to open your airway. It can help you get the air that you need. It is called a positive airway pressure (PAP) device. There are three types of PAP devices:  · Continuous positive airway pressure (CPAP) device.  · Nasal expiratory positive airway pressure (EPAP) device.  · Bilevel positive airway pressure (BPAP) device.  MAKE SURE YOU:  · Understand these instructions.  · Will watch your condition.  · Will get help right away if you are not doing well or get worse.  Document Released: 03/17/2008 Document Revised: 05/25/2012 Document Reviewed: 10/10/2011  [employer]® Patient Information ©2014 [employer].

## 2013-10-11 ENCOUNTER — Encounter (HOSPITAL_COMMUNITY): Payer: Medicare Other

## 2013-10-13 ENCOUNTER — Encounter (HOSPITAL_COMMUNITY): Payer: Medicare Other

## 2013-10-16 ENCOUNTER — Encounter (HOSPITAL_COMMUNITY): Payer: Medicare Other

## 2013-10-18 ENCOUNTER — Encounter (HOSPITAL_COMMUNITY): Payer: Medicare Other

## 2013-10-20 ENCOUNTER — Encounter (HOSPITAL_COMMUNITY): Payer: Medicare Other

## 2013-10-23 ENCOUNTER — Ambulatory Visit (INDEPENDENT_AMBULATORY_CARE_PROVIDER_SITE_OTHER): Payer: Medicare Other | Admitting: *Deleted

## 2013-10-23 ENCOUNTER — Other Ambulatory Visit: Payer: Self-pay | Admitting: *Deleted

## 2013-10-23 ENCOUNTER — Encounter (HOSPITAL_COMMUNITY): Payer: Medicare Other

## 2013-10-23 DIAGNOSIS — I359 Nonrheumatic aortic valve disorder, unspecified: Secondary | ICD-10-CM

## 2013-10-23 DIAGNOSIS — I4891 Unspecified atrial fibrillation: Secondary | ICD-10-CM

## 2013-10-23 DIAGNOSIS — Z5181 Encounter for therapeutic drug level monitoring: Secondary | ICD-10-CM

## 2013-10-23 DIAGNOSIS — Z954 Presence of other heart-valve replacement: Secondary | ICD-10-CM

## 2013-10-23 DIAGNOSIS — I350 Nonrheumatic aortic (valve) stenosis: Secondary | ICD-10-CM

## 2013-10-23 LAB — POCT INR: INR: 2.7

## 2013-10-23 MED ORDER — METOPROLOL TARTRATE 50 MG PO TABS: 50.0000 mg | ORAL_TABLET | Freq: Two times a day (BID) | ORAL | Status: DC

## 2013-10-25 ENCOUNTER — Encounter: Payer: Self-pay | Admitting: Neurology

## 2013-10-25 ENCOUNTER — Encounter (HOSPITAL_COMMUNITY): Payer: Medicare Other

## 2013-10-27 ENCOUNTER — Encounter (HOSPITAL_COMMUNITY): Payer: Medicare Other

## 2013-10-30 ENCOUNTER — Encounter (HOSPITAL_COMMUNITY): Payer: Medicare Other

## 2013-11-01 ENCOUNTER — Encounter (HOSPITAL_COMMUNITY): Payer: Medicare Other

## 2013-11-03 ENCOUNTER — Encounter (HOSPITAL_COMMUNITY): Payer: Medicare Other

## 2013-11-06 ENCOUNTER — Encounter (HOSPITAL_COMMUNITY): Payer: Medicare Other

## 2013-11-08 ENCOUNTER — Encounter (HOSPITAL_COMMUNITY): Payer: Medicare Other

## 2013-11-10 ENCOUNTER — Encounter (HOSPITAL_COMMUNITY): Payer: Medicare Other

## 2013-11-10 ENCOUNTER — Other Ambulatory Visit: Payer: Self-pay | Admitting: Neurology

## 2013-11-15 ENCOUNTER — Encounter (HOSPITAL_COMMUNITY): Payer: Medicare Other

## 2013-11-17 ENCOUNTER — Encounter (HOSPITAL_COMMUNITY): Payer: Medicare Other

## 2013-11-20 ENCOUNTER — Ambulatory Visit (INDEPENDENT_AMBULATORY_CARE_PROVIDER_SITE_OTHER): Payer: Medicare Other | Admitting: Pharmacist

## 2013-11-20 ENCOUNTER — Encounter (HOSPITAL_COMMUNITY): Payer: Medicare Other

## 2013-11-20 DIAGNOSIS — Z954 Presence of other heart-valve replacement: Secondary | ICD-10-CM

## 2013-11-20 DIAGNOSIS — I359 Nonrheumatic aortic valve disorder, unspecified: Secondary | ICD-10-CM

## 2013-11-20 DIAGNOSIS — I350 Nonrheumatic aortic (valve) stenosis: Secondary | ICD-10-CM

## 2013-11-20 DIAGNOSIS — I4891 Unspecified atrial fibrillation: Secondary | ICD-10-CM

## 2013-11-20 DIAGNOSIS — Z5181 Encounter for therapeutic drug level monitoring: Secondary | ICD-10-CM

## 2013-11-20 LAB — POCT INR: INR: 1.5

## 2013-11-22 ENCOUNTER — Encounter (HOSPITAL_COMMUNITY): Payer: Medicare Other

## 2013-11-24 ENCOUNTER — Encounter (HOSPITAL_COMMUNITY): Payer: Medicare Other

## 2013-11-24 ENCOUNTER — Other Ambulatory Visit: Payer: Self-pay | Admitting: Internal Medicine

## 2013-11-24 DIAGNOSIS — R1310 Dysphagia, unspecified: Secondary | ICD-10-CM

## 2013-11-24 DIAGNOSIS — R413 Other amnesia: Secondary | ICD-10-CM

## 2013-11-27 ENCOUNTER — Emergency Department (HOSPITAL_BASED_OUTPATIENT_CLINIC_OR_DEPARTMENT_OTHER)
Admission: EM | Admit: 2013-11-27 | Discharge: 2013-11-27 | Disposition: A | Discharge status: Home / Self Care | Payer: Medicare Other | Attending: Emergency Medicine | Admitting: Emergency Medicine

## 2013-11-27 ENCOUNTER — Emergency Department (HOSPITAL_BASED_OUTPATIENT_CLINIC_OR_DEPARTMENT_OTHER): Payer: Medicare Other

## 2013-11-27 ENCOUNTER — Encounter (HOSPITAL_BASED_OUTPATIENT_CLINIC_OR_DEPARTMENT_OTHER): Payer: Self-pay | Admitting: Emergency Medicine

## 2013-11-27 ENCOUNTER — Encounter (HOSPITAL_COMMUNITY): Payer: Medicare Other

## 2013-11-27 ENCOUNTER — Telehealth: Payer: Self-pay | Admitting: Neurology

## 2013-11-27 DIAGNOSIS — F329 Major depressive disorder, single episode, unspecified: Secondary | ICD-10-CM | POA: Insufficient documentation

## 2013-11-27 DIAGNOSIS — Z7982 Long term (current) use of aspirin: Secondary | ICD-10-CM | POA: Insufficient documentation

## 2013-11-27 DIAGNOSIS — Z87891 Personal history of nicotine dependence: Secondary | ICD-10-CM | POA: Insufficient documentation

## 2013-11-27 DIAGNOSIS — R791 Abnormal coagulation profile: Secondary | ICD-10-CM | POA: Diagnosis not present

## 2013-11-27 DIAGNOSIS — Z87442 Personal history of urinary calculi: Secondary | ICD-10-CM | POA: Diagnosis not present

## 2013-11-27 DIAGNOSIS — J159 Unspecified bacterial pneumonia: Secondary | ICD-10-CM | POA: Insufficient documentation

## 2013-11-27 DIAGNOSIS — Z7901 Long term (current) use of anticoagulants: Secondary | ICD-10-CM | POA: Diagnosis not present

## 2013-11-27 DIAGNOSIS — F3289 Other specified depressive episodes: Secondary | ICD-10-CM | POA: Insufficient documentation

## 2013-11-27 DIAGNOSIS — E785 Hyperlipidemia, unspecified: Secondary | ICD-10-CM | POA: Diagnosis not present

## 2013-11-27 DIAGNOSIS — Z954 Presence of other heart-valve replacement: Secondary | ICD-10-CM | POA: Insufficient documentation

## 2013-11-27 DIAGNOSIS — F411 Generalized anxiety disorder: Secondary | ICD-10-CM | POA: Insufficient documentation

## 2013-11-27 DIAGNOSIS — I10 Essential (primary) hypertension: Secondary | ICD-10-CM | POA: Diagnosis not present

## 2013-11-27 DIAGNOSIS — M1990 Unspecified osteoarthritis, unspecified site: Secondary | ICD-10-CM | POA: Insufficient documentation

## 2013-11-27 DIAGNOSIS — IMO0002 Reserved for concepts with insufficient information to code with codable children: Secondary | ICD-10-CM | POA: Insufficient documentation

## 2013-11-27 DIAGNOSIS — Z79899 Other long term (current) drug therapy: Secondary | ICD-10-CM | POA: Insufficient documentation

## 2013-11-27 DIAGNOSIS — Z794 Long term (current) use of insulin: Secondary | ICD-10-CM | POA: Insufficient documentation

## 2013-11-27 DIAGNOSIS — E119 Type 2 diabetes mellitus without complications: Secondary | ICD-10-CM | POA: Diagnosis not present

## 2013-11-27 DIAGNOSIS — J189 Pneumonia, unspecified organism: Secondary | ICD-10-CM

## 2013-11-27 DIAGNOSIS — R410 Disorientation, unspecified: Secondary | ICD-10-CM

## 2013-11-27 DIAGNOSIS — Z951 Presence of aortocoronary bypass graft: Secondary | ICD-10-CM | POA: Diagnosis not present

## 2013-11-27 DIAGNOSIS — I2510 Atherosclerotic heart disease of native coronary artery without angina pectoris: Secondary | ICD-10-CM | POA: Insufficient documentation

## 2013-11-27 DIAGNOSIS — F29 Unspecified psychosis not due to a substance or known physiological condition: Secondary | ICD-10-CM | POA: Diagnosis not present

## 2013-11-27 DIAGNOSIS — F0390 Unspecified dementia without behavioral disturbance: Secondary | ICD-10-CM | POA: Diagnosis present

## 2013-11-27 LAB — CBC WITH DIFFERENTIAL/PLATELET
BASOS PCT: 1 % (ref 0–1)
Basophils Absolute: 0.1 10*3/uL (ref 0.0–0.1)
Eosinophils Absolute: 0.1 10*3/uL (ref 0.0–0.7)
Eosinophils Relative: 1 % (ref 0–5)
HEMATOCRIT: 42.9 % (ref 39.0–52.0)
Hemoglobin: 14.4 g/dL (ref 13.0–17.0)
LYMPHS PCT: 15 % (ref 12–46)
Lymphs Abs: 1.4 10*3/uL (ref 0.7–4.0)
MCH: 28.4 pg (ref 26.0–34.0)
MCHC: 33.6 g/dL (ref 30.0–36.0)
MCV: 84.6 fL (ref 78.0–100.0)
MONO ABS: 0.7 10*3/uL (ref 0.1–1.0)
Monocytes Relative: 8 % (ref 3–12)
NEUTROS PCT: 75 % (ref 43–77)
Neutro Abs: 7 10*3/uL (ref 1.7–7.7)
PLATELETS: 348 10*3/uL (ref 150–400)
RBC: 5.07 MIL/uL (ref 4.22–5.81)
RDW: 14.4 % (ref 11.5–15.5)
WBC: 9.2 10*3/uL (ref 4.0–10.5)

## 2013-11-27 LAB — COMPREHENSIVE METABOLIC PANEL
ALK PHOS: 81 U/L (ref 39–117)
ALT: 16 U/L (ref 0–53)
AST: 24 U/L (ref 0–37)
Albumin: 3.7 g/dL (ref 3.5–5.2)
BUN: 14 mg/dL (ref 6–23)
CALCIUM: 9.6 mg/dL (ref 8.4–10.5)
CO2: 26 mEq/L (ref 19–32)
Chloride: 101 mEq/L (ref 96–112)
Creatinine, Ser: 0.8 mg/dL (ref 0.50–1.35)
GFR calc Af Amer: >90 mL/min (ref >90)
GFR calc non Af Amer: 85 mL/min — ABNORMAL LOW (ref >90)
GLUCOSE: 263 mg/dL — AB (ref 70–99)
Potassium: 4.3 mEq/L (ref 3.7–5.3)
Sodium: 140 mEq/L (ref 137–147)
Total Bilirubin: 0.7 mg/dL (ref 0.3–1.2)
Total Protein: 7.7 g/dL (ref 6.0–8.3)

## 2013-11-27 LAB — URINALYSIS, ROUTINE W REFLEX MICROSCOPIC
Bilirubin Urine: NEGATIVE
Glucose, UA: NEGATIVE mg/dL
Hgb urine dipstick: NEGATIVE
KETONES UR: 15 mg/dL — AB
LEUKOCYTES UA: NEGATIVE
NITRITE: NEGATIVE
PH: 6 (ref 5.0–8.0)
Protein, ur: 30 mg/dL — AB
SPECIFIC GRAVITY, URINE: 1.021 (ref 1.005–1.030)
Urobilinogen, UA: 1 mg/dL (ref 0.0–1.0)

## 2013-11-27 LAB — PROTIME-INR
INR: 1.36 (ref 0.00–1.49)
Prothrombin Time: 16.4 seconds — ABNORMAL HIGH (ref 11.6–15.2)

## 2013-11-27 LAB — URINE MICROSCOPIC-ADD ON

## 2013-11-27 LAB — APTT: aPTT: 38 seconds — ABNORMAL HIGH (ref 24–37)

## 2013-11-27 LAB — CBG MONITORING, ED: GLUCOSE-CAPILLARY: 318 mg/dL — AB (ref 70–99)

## 2013-11-27 MED ORDER — AMOXICILLIN 500 MG PO CAPS: 1000.0000 mg | ORAL_CAPSULE | Freq: Two times a day (BID) | ORAL | Status: DC

## 2013-11-27 MED ORDER — DEXTROSE 5 % IV SOLN
1.0000 g | Freq: Once | INTRAVENOUS | Status: AC
  Administered 2013-11-27: 1 g via INTRAVENOUS

## 2013-11-27 MED ORDER — CEFTRIAXONE SODIUM 1 G IJ SOLR
INTRAMUSCULAR | Status: AC
  Filled 2013-11-27: qty 10

## 2013-11-27 NOTE — ED Notes (Signed)
CBG 318. Clare Charon, RN notified.

## 2013-11-27 NOTE — ED Provider Notes (Signed)
Medical screening examination/treatment/procedure(s) were performed by non-physician practitioner and as supervising physician I was immediately available for consultation/collaboration.   Date: 11/27/2013  Rate: 70  Rhythm: normal sinus rhythm  QRS Axis: normal  Intervals: normal  ST/T Wave abnormalities: nonspecific T wave changes  Conduction Disutrbances:right bundle branch block, LAFB  Narrative Interpretation:   Old EKG Reviewed: unchanged     Geoffery Lyons, MD 11/28/13 1541

## 2013-11-27 NOTE — ED Provider Notes (Signed)
CSN: [id number]     Arrival date & time 11/27/13  1026 History   First MD Initiated Contact with Patient 11/27/13 1032     Chief Complaint  Patient presents with  . Dementia     (Consider location/radiation/quality/duration/timing/severity/associated sxs/prior Treatment) Patient is a 79 y.o. male presenting with altered mental status. The history is provided by the patient and a relative. No language interpreter was used.  Altered Mental Status Presenting symptoms: confusion and disorientation   Associated symptoms: no fever and no vomiting   Associated symptoms comment:  The patient is here with complaint of "I don't feel well". He denies pain, vomiting or SOB. He has a mild cough. Per the family, he has intermittent worsening of his baseline confusion of mild dementia. No recent fall or injury. No known fever. Also per family, the patient has lost his appetite. Symptoms for 3 days.   Past Medical History  Diagnosis Date  . Hypertension   . Hyperlipidemia   . Type II or unspecified type diabetes mellitus without mention of complication, not stated as uncontrolled   . Nephrolithiasis   . Atrial flutter     s/p CTI ablation by Dr Ladona Ridgelaylor 1/12  . CAD (coronary artery disease)     s/p CABG x 5 2003 by Dr Laneta SimmersBartle  . Aortic stenosis, moderate     moderate to severe per echo 03/2012 with AVR in March of 2014  . DJD (degenerative joint disease)   . DDD (degenerative disc disease)   . Spinal stenosis of lumbar region   . Bifascicular block   . Atrial tachycardia   . Anxiety   . Depression   . S/P AVR (aortic valve replacement)   . OSA (obstructive sleep apnea)   . 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 Cabg  2003    by Dr Laneta SimmersBartle  . Anal fissure repair    . Leg surgery      right  . Trigger finger release      bilat.  . Tonsillectomy    . Back surgery  2010  . Atrial ablation surgery  1/12    atrial flutter  ablation by Dr Ladona Ridgelaylor  . Cardioversion  04/07/2012    Procedure: CARDIOVERSION;  Surgeon: Wendall StadePeter C Nishan, MD;  [facility];  Service: Cardiovascular;  Laterality: N/A;  . Coronary artery bypass graft      10 yrs ago   03  . Aortic valve replacement N/A 09/01/2012    Procedure: AORTIC VALVE REPLACEMENT (AVR);  Surgeon: Alleen BorneBryan K Bartle, MD;  [facility];  Service: Open Heart Surgery;  Laterality: N/A;  . Intraoperative transesophageal echocardiogram N/A 09/01/2012    Procedure: INTRAOPERATIVE TRANSESOPHAGEAL ECHOCARDIOGRAM;  Surgeon: Alleen BorneBryan K Bartle, MD;  [facility];  Service: Open Heart Surgery;  Laterality: N/A;   Family History  Problem Relation Age of Onset  . Heart disease Father   . Heart disease Paternal Grandfather   . Stroke Mother   . Rheum arthritis Mother    History  Substance Use Topics  . Smoking status: Former Smoker -- 1.00 packs/day for 49 years    Quit date: 08/21/2001  . Smokeless tobacco: Never Used     Comment: started at age 79.   [name]. Alcohol Use: No     Comment: quit 15 yrs ago    Review of Systems  Constitutional: Positive for fatigue. Negative for fever and chills.  HENT: Negative.  Negative  for congestion.   Respiratory: Positive for cough. Negative for shortness of breath.   Cardiovascular: Negative.  Negative for chest pain.  Gastrointestinal: Negative.  Negative for vomiting.  Musculoskeletal: Negative.  Negative for myalgias.  Skin: Negative.   Neurological: Negative.   Psychiatric/Behavioral: Positive for confusion.      Allergies  Doxycycline  Home Medications   Prior to Admission medications   Medication Sig Start Date End Date Taking? Authorizing Provider  albuterol (PROVENTIL HFA;VENTOLIN HFA) 108 (90 BASE) MCG/ACT inhaler Inhale 2 puffs into the lungs every 6 (six) hours as needed for wheezing. 08/09/13   Barbaraann Share, MD  aspirin EC 325 MG EC tablet Take 1 tablet (325 mg total) by mouth daily. 09/06/12   Donielle Margaretann Loveless, PA-C  cilostazol (PLETAL) 50 MG tablet Take 1 tablet by mouth Twice daily. 03/15/12   Historical Provider, MD  fluticasone (FLONASE) 50 MCG/ACT nasal spray Place 1-2 sprays into both nostrils daily.  05/01/13   Historical Provider, MD  insulin glargine (LANTUS) 100 UNIT/ML injection Inject 35 Units into the skin at bedtime.    Historical Provider, MD  insulin lispro (HUMALOG) 100 UNIT/ML injection Inject 5 Units into the skin as needed for high blood sugar.    Historical Provider, MD  LORazepam (ATIVAN) 1 MG tablet Take 1-2 mg by mouth at bedtime as needed.      Historical Provider, MD  losartan (COZAAR) 50 MG tablet Take 1 tablet (50 mg total) by mouth daily. 11/01/12   [name], MD  metFORMIN (GLUCOPHAGE-XR) 500 MG 24 hr tablet Take 1,000 mg by mouth at bedtime.      Historical Provider, MD  metoprolol (LOPRESSOR) 50 MG tablet Take 1 tablet (50 mg total) by mouth 2 (two) times daily. 10/23/13   [name], MD  simvastatin (ZOCOR) 10 MG tablet Take 1 tablet (10 mg total) by mouth at bedtime. 10/05/11   [name], MD  Tamsulosin HCl (FLOMAX) 0.4 MG CAPS Take 0.4 mg by mouth at bedtime.  03/23/11   Historical Provider, MD  traZODone (DESYREL) 50 MG tablet TAKE 1 TABLET BY MOUTH EVERY NIGHT AT BEDTIME    Melvyn Novas, MD  venlafaxine (EFFEXOR) 75 MG tablet Take 150mg  in the morning and 75mg  at bedtime.    Historical Provider, MD  warfarin (COUMADIN) 5 MG tablet Take as directed by coumadin clinic 05/15/13   [name], MD  zolpidem (AMBIEN) 5 MG tablet Take 5 mg by mouth at bedtime. 11/30/12   Historical Provider, MD   BP 142/58  Pulse 81  Temp(Src) 98 F (36.7 C) (Oral)  Resp 18  Ht 5\' 4"  (1.626 m)  Wt 175 lb (79.379 kg)  BMI 30.02 kg/m2  SpO2 98% Physical Exam  Constitutional: He is oriented to person, place, and time. He appears well-developed and well-nourished.  HENT:  Head: Normocephalic.  Eyes: Conjunctivae are normal.  Neck: Normal range of motion. Neck supple.   Cardiovascular: Normal rate and regular rhythm.   No murmur heard. Pulmonary/Chest: Effort normal and breath sounds normal. He has no wheezes. He has no rales. He exhibits no tenderness.  Abdominal: Soft. Bowel sounds are normal. There is no tenderness. There is no rebound and no guarding.  Musculoskeletal: Normal range of motion. He exhibits no edema.  Neurological: He is alert and oriented to person, place, and time. Coordination normal.  Skin: Skin is warm and dry. No rash noted.  Psychiatric: He has a normal mood and affect.    ED Course  Procedures (including critical care time) Labs Review Labs Reviewed  COMPREHENSIVE METABOLIC PANEL - Abnormal; Notable for the following:    Glucose, Bld 263 (*)    GFR calc non Af Amer 85 (*)    All other components within normal limits  URINALYSIS, ROUTINE W REFLEX MICROSCOPIC - Abnormal; Notable for the following:    Color, Urine AMBER (*)    Ketones, ur 15 (*)    Protein, ur 30 (*)    All other components within normal limits  PROTIME-INR - Abnormal; Notable for the following:    Prothrombin Time 16.4 (*)    All other components within normal limits  APTT - Abnormal; Notable for the following:    aPTT 38 (*)    All other components within normal limits  CBG MONITORING, ED - Abnormal; Notable for the following:    Glucose-Capillary 318 (*)    All other components within normal limits  URINE CULTURE  CBC WITH DIFFERENTIAL  URINE MICROSCOPIC-ADD ON    Imaging Review Dg Chest 2 View  11/27/2013   CLINICAL DATA:  Dementia.  Coronary artery disease.  EXAM: CHEST  2 VIEW  COMPARISON:  09/28/2012  FINDINGS: New small right pleural effusion versus pleural thickening is seen since previous study. There is new pulmonary opacity in the peripheral right lung which has a coarse appearance suggesting scarring, although acute infiltrate cannot definitely be excluded.  The left lung remains clear. Heart size is within normal limits. Prior CABG aortic  valve replacement noted.  IMPRESSION: New right lower lung pleural-parenchymal opacity, which may be due to scarring although infiltrate and small right pleural effusion cannot definitely be excluded.   Electronically Signed   By: Myles Rosenthal M.D.   On: 11/27/2013 12:11   Ct Head Wo Contrast  11/27/2013   CLINICAL DATA:  Dementia.  Altered mental status.  EXAM: CT HEAD WITHOUT CONTRAST  TECHNIQUE: Contiguous axial images were obtained from the base of the skull through the vertex without intravenous contrast.  COMPARISON:  Brain MRI, 06/09/2013  FINDINGS: Ventricles are normal in configuration. There is ventricular and sulcal enlargement reflecting moderate to atrophy. Patchy periventricular white matter hypoattenuation is noted consistent with moderate chronic microvascular ischemic change. There are no parenchymal masses or mass effect. There is no evidence of a cortical infarct.  There are no extra-axial masses or abnormal fluid collections.  Visualized sinuses and mastoid air cells are clear. No skull lesion.  IMPRESSION: 1. No acute intracranial abnormalities. 2. Moderate atrophy and chronic microvascular ischemic change, stable from the prior brain MRI.   Electronically Signed   By: Amie Portland M.D.   On: 11/27/2013 11:42      MDM   Final diagnoses:  None    1. Pneumonia 2. Confusion  The patient's mental status has been baseline here per family at bedside. He remains awake, alert, contributes to history with correct facts, is oriented x3. Questionable findings on x-ray for CAP. Will treat with abx based on clinical presentation/history and encourage close follow up with [name] this week. Family agrees with and is comfortable with care plan.    Arnoldo Hooker, PA-C 11/27/13 1450

## 2013-11-27 NOTE — Discharge Instructions (Signed)

## 2013-11-27 NOTE — ED Notes (Signed)
Pts family reports that pt has been screened for dementia but not officially dx with it yet.  Pt A/O to person only.  Pt very confused, reports year is 1938.  Family states that pt had a drastic change in mentation and ability to function (ie dress himself, order food, remember years) since yesterday.  Pt able to get out of wheelchair, ambulate without difficulty.  Denies pain.

## 2013-11-27 NOTE — Telephone Encounter (Signed)
Spoke to daughter and advised they go to the ER, big change in behavior.  The patient already saw PCP on Friday, no UTI found.  They agreed to go to the ED.

## 2013-11-29 ENCOUNTER — Encounter (HOSPITAL_COMMUNITY): Payer: Medicare Other

## 2013-11-29 LAB — URINE CULTURE
CULTURE: NO GROWTH
Colony Count: NO GROWTH

## 2013-11-30 ENCOUNTER — Ambulatory Visit
Admission: RE | Admit: 2013-11-30 | Discharge: 2013-11-30 | Disposition: A | Discharge status: Home / Self Care | Payer: Medicare Other | Source: Ambulatory Visit | Attending: Internal Medicine | Admitting: Internal Medicine

## 2013-11-30 ENCOUNTER — Ambulatory Visit (INDEPENDENT_AMBULATORY_CARE_PROVIDER_SITE_OTHER): Payer: Medicare Other | Admitting: *Deleted

## 2013-11-30 ENCOUNTER — Other Ambulatory Visit: Payer: Medicare Other

## 2013-11-30 ENCOUNTER — Inpatient Hospital Stay: Admission: RE | Admit: 2013-11-30 | Payer: Medicare Other | Source: Ambulatory Visit

## 2013-11-30 ENCOUNTER — Encounter (INDEPENDENT_AMBULATORY_CARE_PROVIDER_SITE_OTHER): Payer: Self-pay

## 2013-11-30 DIAGNOSIS — R413 Other amnesia: Secondary | ICD-10-CM

## 2013-11-30 DIAGNOSIS — I359 Nonrheumatic aortic valve disorder, unspecified: Secondary | ICD-10-CM

## 2013-11-30 DIAGNOSIS — Z5181 Encounter for therapeutic drug level monitoring: Secondary | ICD-10-CM

## 2013-11-30 DIAGNOSIS — I350 Nonrheumatic aortic (valve) stenosis: Secondary | ICD-10-CM

## 2013-11-30 DIAGNOSIS — Z954 Presence of other heart-valve replacement: Secondary | ICD-10-CM

## 2013-11-30 DIAGNOSIS — I4891 Unspecified atrial fibrillation: Secondary | ICD-10-CM

## 2013-11-30 DIAGNOSIS — R1310 Dysphagia, unspecified: Secondary | ICD-10-CM

## 2013-11-30 LAB — POCT INR: INR: 1.9

## 2013-12-01 ENCOUNTER — Encounter (HOSPITAL_COMMUNITY): Payer: Medicare Other

## 2013-12-04 ENCOUNTER — Encounter (HOSPITAL_COMMUNITY): Payer: Medicare Other

## 2013-12-04 ENCOUNTER — Telehealth: Payer: Self-pay | Admitting: Neurology

## 2013-12-04 NOTE — Telephone Encounter (Signed)
Please put him on my schedule for sleep clinic , in a consult slot.

## 2013-12-04 NOTE — Telephone Encounter (Signed)
Received an urgent call from Dr. Silvano RuskPaterson's office regarding this patient.  Recent ov with Dr. Eloise HarmanPaterson reveals a sharp decline in pt's cognitive function.  They want him reevaluated as soon as possible.  I called the patient because he just had an office visit with Dr. Vickey Hugerohmeier in April, to find out what clinical elements will warrant such urgency.  Wife says that the pt's memory loss/confusion had progressed to the point that pt could not dress or bathe himself and led to 11/27/2013 ER visit.  Wife was upset that notes sent to Dr. Eloise HarmanPaterson after visit with Dr. Vickey Hugerohmeier indicated atypical Parkinson's - but she says that so far there has only been indications for problems, yet no treatment.  She is also unhappy that the next follow up visit is not only in November, but it is also with the NP and not the doctor.  Dr. Eloise HarmanPaterson wants pt to be evaluated asap to see if his intermittent use of CPAP is causing decrease in cognitive function.  Advised pt that I would send STAT request for advisement to Dr. Vickey Hugerohmeier.

## 2013-12-05 ENCOUNTER — Encounter: Payer: Self-pay | Admitting: Internal Medicine

## 2013-12-05 ENCOUNTER — Encounter (INDEPENDENT_AMBULATORY_CARE_PROVIDER_SITE_OTHER): Payer: Self-pay

## 2013-12-05 ENCOUNTER — Ambulatory Visit (INDEPENDENT_AMBULATORY_CARE_PROVIDER_SITE_OTHER): Payer: Medicare Other | Admitting: Neurology

## 2013-12-05 ENCOUNTER — Encounter: Payer: Self-pay | Admitting: Neurology

## 2013-12-05 ENCOUNTER — Other Ambulatory Visit: Payer: Medicare Other

## 2013-12-05 VITALS — BP 111/57 | HR 65 | Resp 17 | Ht 64.5 in | Wt 168.0 lb

## 2013-12-05 DIAGNOSIS — R410 Disorientation, unspecified: Secondary | ICD-10-CM

## 2013-12-05 DIAGNOSIS — F05 Delirium due to known physiological condition: Secondary | ICD-10-CM

## 2013-12-05 DIAGNOSIS — R159 Full incontinence of feces: Secondary | ICD-10-CM

## 2013-12-05 DIAGNOSIS — R413 Other amnesia: Secondary | ICD-10-CM

## 2013-12-05 DIAGNOSIS — R9082 White matter disease, unspecified: Secondary | ICD-10-CM

## 2013-12-05 DIAGNOSIS — G912 (Idiopathic) normal pressure hydrocephalus: Secondary | ICD-10-CM

## 2013-12-05 DIAGNOSIS — G9389 Other specified disorders of brain: Secondary | ICD-10-CM

## 2013-12-05 DIAGNOSIS — R269 Unspecified abnormalities of gait and mobility: Secondary | ICD-10-CM

## 2013-12-05 DIAGNOSIS — M48061 Spinal stenosis, lumbar region without neurogenic claudication: Secondary | ICD-10-CM

## 2013-12-05 MED ORDER — MEMANTINE HCL 28 X 5 MG & 21 X 10 MG PO TABS: ORAL_TABLET | ORAL | Status: DC

## 2013-12-05 NOTE — Progress Notes (Signed)
[facility] SLEEP MEDICINE CLINIC   Provider:  Melvyn [name], M D  Referring Provider: Jarome Matin, MD Primary Care Physician:  Adam Fillers, MD  Chief Complaint  Patient presents with  . Follow-up    Room 11  . Sleep Apnea    HPI:  Adam [name] is a 79 y.o. male , a retired Teacher, early years/pre . He  is seen here as a referral  from Dr. Eloise [name] for a confusion  follow up:  Adam [name] family reports that the patient has lost weight unintentionally neutral a loss of interest in eating or loss of appetite. He also appears to have periods of apraxia and confusion. His wife and daughter are here today with Adam [name] and reported the last month was very difficult especially the first week of 2 seem to have been a peak for cognitive impairment and confusion. The couple for the saw [name] [name] [name] some of the medications that he felt could have affected the cognition. The patient still has Ambien 5 mg at night ordered, he does have insulin, blood pressure medication hyperlipidemia peptic medication and Pletal. Is on Effexor in a generic form is longer on a statin medication he still has this breather and Symbicort airway treatment available but hasn't been using those recently. Worsening confusion led to an emergency visit on June 8 his workup included normal vital signs and pulse oxygen saturation capillary blood glucose was level was 318 mmol chest x-ray showed some right lower lobe airspace disease which was normal before. CT scan of her had short more moderate atrophy but no acute changes and given normal EKG. He was started on an antibiotic for possible pneumonia and then seen in followup by Dr. Jarold [name]. The patient is now finishing the antibiotic but a normal knee was never confirmed.  The patient was to be the geriatric depression score at 10 points at worst sleepiness score at 3 points and fatigue severity at 0. He scored 20 points on MMSE but was  irriatble and agitated. He corrected himslef to the right date and Year. Lost 2 words in recall, unable to draw a clock , numbers or hands.     Last visit note.   This patient is a pleasant , married, 51 your old right-handed Caucasian gentleman. He is a retired Teacher, early years/pre , who was referred by Dr. Jarold [name] for evaluation of insomnia, snoring, and a sleep disorder that seems to have evolved since by bypass surgery 2004 or 2003  and progressed after he in March 2014  an aortic valve repair by Adam [name]. Since surgery he feels more fatigued and short of breath, sluggish and forgetful. During cardiac rehab he made progress but he stopped the exercises.  I reviewed his medications, he confirmed that he takes these as indicated below.  He goes to bed at the same time week days and week ends. He is going to sleep at midnight, his wife will already be in bed by 10 PM. He will have trouble initiating sleep, denies having slept on sofa or recliner, he watches TV in the bedroom, and disturbes his wife. He will need Ambien to initiate sleep, will go to the bedroom some nights 4-5 times. It is off all nocturnal sleep time at about 6 hours. He has always awoken spontaneously in the morning he does not need an alarm . His wife reports that he actually sleeps through the alarm until he wakes up spontaneously- between 11 and 12 in the morning.  The couples adult daughter lives with the Adam [name]. His wife endorsed the Epworth score for him : at 20 points, FSS at 50 points.  Adam [name] responses took a very long time, he appears  bradykinetik and bradyphrenic.  He has lost the ability to screen the answering machine on his phone and to use his camera, apraxia. His wife has taken over balancing the check book. He is supposed to have dementia following an acute ischemic/hypoxic insult after heart valve.  Adam [name] was unable to initiate sleep in the lab during a sleep study in January 2015, After petitioning  Medicare  we were able to obtain a home sleep test. The test was done by the patient used his home oxygen supply it indicated an AHI of 12 but with persistence take tachybradycardia arrhythmias;  Based on this I believe that without oxygen he would have likely had an apnea index more than 100% higher than in his home sleep test on oxygen.   The study was interpreted on 07-27-13 and I ordered an auto-set CPAP in response ,  having a pressure window between 4 and 12 cm water with an EBI of 2 unfortunately his AHI is still 7.9. Review shows that days of high air leak ( over 80 liters) are those of high AHI.  Excluding these , the AHI is 5.5 /hr.  The patient had excellent compliance of 100% over  30 out of 30 days, 8 hours and 13 minutes on average daily use.  He also is less daytime sleepy. He was not aware of how sleepy he was. He noticed that he can actually follow conversations better and is not falling asleep when not stimulated.   He is short of breath while speaking to me. His voice is raspy . His wife states here he is now napping throughout the day, often for several hours. He is snoring loudly and she witnessed some crescendo snoring, raising suspicions for OSA.  Adam [name] reports he has several bathroom breaks a day, attributed this to age, he has morning headaches. He has no REM BD, no sleep walking. But he is often confused in the morning. He has lost interest in reading  , conversations he reads   short magazine articles. He watches more TV. His personality has changed since he is sleepier or  because of the sleepiness. He  never bounced back from surgery.  Adam [name] underwent a sleep study on 06-28-13, he has been using benzodiazepines for a long time for sleep induction but felt not excessively daytime sleepy. He had a large neck circumference which would cause him at higher risk of sleep apnea, and a BMI of 31. The sleep study was not successful as the patient was unable to sleep in spite of having taken  his medication. So he felt that he was not paradoxically stimulated by the sleep aid. Based on this I have asked for his insurance to permit an exception for a home sleep test.  Based on the additional complaint of memory loss an MRI of the brain have been obtained dated 06-09-13 which showed moderate changes of chronic microvascular changes and generalized cerebral atrophy.   We also obtained an Mini-Mental status exam in January 2015 on  this patient, a retired Teacher, early years/prepharmacist, who scored 22/ 30 points. A  Montral cognitive assessment test was aborted after the visual spatial questions were not been successfully solved.  He has no REM BD according to his spouse. He has moderate dementia based on these  test results.         Review of Systems: Out of a complete 14 system review, the patient complains of only the following symptoms, and all other reviewed systems are negative.  Epworth per wife 12 and his own assessment  3 !! GDS 10 pints, FSS a zero  - MMSE 20-30 with extra time. History   Social History  . Marital Status: Married    Spouse Name: Adam [name]    Number of Children: 5  . Years of Education: college   Occupational History  . pharmacist     retired   Social History Main Topics  . Smoking status: Former Smoker -- 1.00 packs/day for 49 years    Quit date: 08/21/2001  . Smokeless tobacco: Never Used     Comment: started at age 38.   [name] Alcohol Use: No     Comment: quit 15 yrs ago  . Drug Use: No  . Sexual Activity: No   Other Topics Concern  . 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   Patient has two children and his wife has three children.   Patient has a college education.   Patient is right- handed.   Patient does not drink any caffeine.    He is a retired Teacher, early years/pre.    Family History  Problem Relation Age of Onset  . Heart disease Father   . Heart disease Paternal Grandfather   . Stroke  Mother   . Rheum arthritis Mother     Past Medical History  Diagnosis Date  . Hypertension   . Hyperlipidemia   . Type II or unspecified type diabetes mellitus without mention of complication, not stated as uncontrolled   . Nephrolithiasis   . Atrial flutter     s/p CTI ablation by Dr Ladona Ridgel 1/12  . CAD (coronary artery disease)     s/p CABG x 5 2003 by Dr Adam [name]  . Aortic stenosis, moderate     moderate to severe per echo 03/2012 with AVR in March of 2014  . DJD (degenerative joint disease)   . DDD (degenerative disc disease)   . Spinal stenosis of lumbar region   . Bifascicular block   . Atrial tachycardia   . Anxiety   . Depression   . S/P AVR (aortic valve replacement)   . OSA (obstructive sleep apnea)   . 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 Cabg  2003    by Dr Adam [name]  . Anal fissure repair    . Leg surgery      right  . Trigger finger release      bilat.  . Tonsillectomy    . Back surgery  2010  . Atrial ablation surgery  1/12    atrial flutter ablation by Dr Ladona Ridgel  . Cardioversion  04/07/2012    Procedure: CARDIOVERSION;  Surgeon: Wendall Stade, MD;  Location: [facility] ENDOSCOPY;  Service: Cardiovascular;  Laterality: N/A;  . Coronary artery bypass graft      10 yrs ago   03  . Aortic valve replacement N/A 09/01/2012    Procedure: AORTIC VALVE REPLACEMENT (AVR);  Surgeon: Alleen Borne, MD;  Location: [facility] OR;  Service: Open Heart Surgery;  Laterality: N/A;  . Intraoperative transesophageal echocardiogram N/A 09/01/2012    Procedure: INTRAOPERATIVE TRANSESOPHAGEAL ECHOCARDIOGRAM;  Surgeon: Alleen Borne, MD;  Location: [facility]  OR;  Service: Open Heart Surgery;  Laterality: N/A;    Current Outpatient Prescriptions  Medication Sig Dispense Refill  . albuterol (PROVENTIL HFA;VENTOLIN HFA) 108 (90 BASE) MCG/ACT inhaler Inhale 2 puffs into the lungs every 6 (six) hours as needed for wheezing.  1 Inhaler   6  . amoxicillin (AMOXIL) 500 MG capsule Take 2 capsules (1,000 mg total) by mouth 2 (two) times daily.  40 capsule  0  . aspirin EC 325 MG EC tablet Take 1 tablet (325 mg total) by mouth daily.  30 tablet    . cilostazol (PLETAL) 50 MG tablet Take 1 tablet by mouth Twice daily.      . fluticasone (FLONASE) 50 MCG/ACT nasal spray Place 1-2 sprays into both nostrils daily.       . insulin glargine (LANTUS) 100 UNIT/ML injection Inject 35 Units into the skin at bedtime.      . insulin lispro (HUMALOG) 100 UNIT/ML injection Inject 5 Units into the skin as needed for high blood sugar.      . losartan (COZAAR) 50 MG tablet Take 1 tablet (50 mg total) by mouth daily.  90 tablet  3  . metFORMIN (GLUCOPHAGE-XR) 500 MG 24 hr tablet Take 1,000 mg by mouth at bedtime.        . metoprolol (LOPRESSOR) 50 MG tablet Take 1 tablet (50 mg total) by mouth 2 (two) times daily.  180 tablet  1  . NITROSTAT 0.4 MG SL tablet as needed.      . simvastatin (ZOCOR) 10 MG tablet Take 1 tablet (10 mg total) by mouth at bedtime.  90 tablet  3  . Tamsulosin HCl (FLOMAX) 0.4 MG CAPS Take 0.4 mg by mouth at bedtime.       [name] venlafaxine (EFFEXOR) 75 MG tablet Take 150mg  in the morning and 75mg  at bedtime.      [name] warfarin (COUMADIN) 5 MG tablet Take as directed by coumadin clinic  150 tablet  1  . zolpidem (AMBIEN) 5 MG tablet Take 5 mg by mouth at bedtime.       No current facility-administered medications for this visit.    Allergies as of 12/05/2013 - Review Complete 12/05/2013  Allergen Reaction Noted  . Doxycycline      Vitals: BP 111/57  Pulse 65  Resp 17  Ht 5' 4.5" (1.638 m)  Wt 168 lb (76.204 kg)  BMI 28.40 kg/m2 Last Weight:  Wt Readings from Last 1 Encounters:  12/05/13 168 lb (76.204 kg)   Last Height:   Ht Readings from Last 1 Encounters:  12/05/13 5' 4.5" (1.638 m)    Physical exam:  General: The patient is awake, alert and appears not in acute distress. The patient is well groomed. Head:  Normocephalic, atraumatic. Neck is supple. Mallampati 4 , neck circumference:18 , no TMJ - Cardiovascular:  Regular rate and rhythm , without carotid bruit, and without distended neck veins. Respiratory: Lungs are clear to auscultation. Skin:  Without evidence of edema, or rash, he has had femoral artery disease, leg claudication.  Trunk: BMI is  elevated .  Neurologic exam : The patient is sluggish, slow  and alert, oriented to place and time.   Memory subjective  described as impaired . There is an abnormal attention span & concentration ability. He perseverates.   Speech is fluent without  Dysarthria, but with  dysphonia .  Mood and affect are appropriate.  Cranial nerves: Pupils are equal and briskly reactive to light. Funduscopic exam without  evidence of pallor or edema. Extraocular movements  in vertical and horizontal planes intact and without nystagmus. Visual fields by finger perimetry are intact. Hearing to finger rub intact.  Facial sensation intact to fine touch. Facial motor strength is symmetric and tongue  move midline. Left uvula lower, he has a drooping mouth, open while speaking, sitting at rest.   Motor exam:   Increased rigidity , tone, gegenhalten and delayed response to motor commands.   Sensory:  Fine touch, pinprick and vibration were tested in all extremities.    Coordination: Rapid alternating movements in the fingers/hands is tested and normal.  Finger-to-nose maneuver tested and slow!!! evidence of ataxia, dysmetria or tremor.  Gait and station: Patient walks without assistive device. Slow ambulation, bend forwards, wife based and slow  . Stance is wide based. Tandem gait is impossible Romberg testing is abnormal. Turned with 3 steps.   Deep tendon reflexes: in the  upper and lower extremities are symmetric and intact. Babinski maneuver response is  downgoing.   Assessment:  After physical and neurologic examination, review of laboratory studies, imaging,  neurophysiology testing and pre-existing records, assessment is   0) OSA- treated with CPAP at 11 cm water ,  he has been unable to implement the  CPAP into a routine. He is progressively more and more demented and confused.  His CPAP may not help him much giving the difficulties using it at an AHI of only 10.  He needs his oxygen supplement.     1)  Vascular dementia versus protracted effect of peri-surgical hypoxemia. MRI with generalized atrophy and microvascular disease. There are some features of parkinsonism. He is realizing his cognitive impairment but underestimates the degree.  2)Gait instability,  Wide based- masked face, cognitive decline but not RLS, REM BD , and no tremor at rest. Can be due to spinal stenosis and /or glucose levels/     Plan:  Discontinue CPAP - his AHi is too low to justify the extra burden on him and his caretakers.  I suspect dementia progression due to pneumonia , not  due to antibiotic . I will start on namenda , will add Aricept in the next 3 month.  I will need to have PT / neuro rehab work with him on PD , he has weight loss, loss  sphincter control , gait instability and dementia with PD features.   NPH symptoms but not the CT findings.  Send for LP with pre and post evaluation by PT

## 2013-12-05 NOTE — Addendum Note (Signed)
Addended by: Melvyn NovasHMEIER, Sheranda Seabrooks on: 12/05/2013 06:03 PM   Modules accepted: Orders

## 2013-12-05 NOTE — Addendum Note (Signed)
Addended by: Melvyn NovasHMEIER, CARMEN on: 12/05/2013 02:59 PM   Modules accepted: Orders

## 2013-12-06 ENCOUNTER — Telehealth: Payer: Self-pay | Admitting: *Deleted

## 2013-12-06 ENCOUNTER — Encounter (HOSPITAL_COMMUNITY): Payer: Medicare Other

## 2013-12-06 NOTE — Telephone Encounter (Signed)
Patient was scheduled for an appointment on yesterday, June 16.

## 2013-12-06 NOTE — Telephone Encounter (Signed)
Message was left for Adam [name] at [facility] about the Lp the patient needs to have with Pt before and post the LP- 2 hours max.  How to set this appointment up?

## 2013-12-08 ENCOUNTER — Telehealth: Payer: Self-pay | Admitting: Internal Medicine

## 2013-12-08 ENCOUNTER — Encounter (HOSPITAL_COMMUNITY): Payer: Medicare Other

## 2013-12-08 NOTE — Telephone Encounter (Signed)
Spoke with Duwayne Heckanielle and let her know Dr Johney FrameAllred will be here next Wed to address

## 2013-12-08 NOTE — Telephone Encounter (Signed)
New message     Patient on two blood thinner medication. Need to be off 4 days prior for upcoming lumbar puncture.

## 2013-12-11 ENCOUNTER — Encounter (HOSPITAL_COMMUNITY): Payer: Medicare Other

## 2013-12-13 ENCOUNTER — Encounter (HOSPITAL_COMMUNITY): Payer: Medicare Other

## 2013-12-15 ENCOUNTER — Ambulatory Visit (INDEPENDENT_AMBULATORY_CARE_PROVIDER_SITE_OTHER): Payer: Medicare Other | Admitting: Pharmacist

## 2013-12-15 ENCOUNTER — Encounter (HOSPITAL_COMMUNITY): Payer: Medicare Other

## 2013-12-15 DIAGNOSIS — I359 Nonrheumatic aortic valve disorder, unspecified: Secondary | ICD-10-CM

## 2013-12-15 DIAGNOSIS — I4891 Unspecified atrial fibrillation: Secondary | ICD-10-CM

## 2013-12-15 DIAGNOSIS — Z954 Presence of other heart-valve replacement: Secondary | ICD-10-CM

## 2013-12-15 DIAGNOSIS — I350 Nonrheumatic aortic (valve) stenosis: Secondary | ICD-10-CM

## 2013-12-15 DIAGNOSIS — Z5181 Encounter for therapeutic drug level monitoring: Secondary | ICD-10-CM

## 2013-12-15 LAB — POCT INR: INR: 1.7

## 2013-12-15 NOTE — Telephone Encounter (Signed)
Discussed with Dr Johney FrameAllred, as patient has a tissue AVR and no history of embolic event per MRI of the brian 05/2013 may hold Warfarin and Aspirin 4 days prior to procedure.  Resume usual dose at discretion of MD following procedure

## 2013-12-15 NOTE — Telephone Encounter (Signed)
Clearance faxed to [facility] 

## 2013-12-18 ENCOUNTER — Encounter (HOSPITAL_COMMUNITY): Payer: Medicare Other

## 2013-12-20 ENCOUNTER — Encounter (HOSPITAL_COMMUNITY): Payer: Medicare Other

## 2013-12-20 NOTE — Telephone Encounter (Signed)
A message was sent through staff messaging to Danielle at [facility] about has the cardiologist given the ok for the patient to stop the blood thinner medication for his LP to be done.

## 2013-12-21 NOTE — Telephone Encounter (Signed)
Pt was called next door and they will be calling back after looking at the schedule to see when we can get this scheduled, may be the week after next.

## 2013-12-21 NOTE — Telephone Encounter (Signed)
Danielle from [facility] calling regarding patient, please return call at [phone].

## 2013-12-22 ENCOUNTER — Encounter (HOSPITAL_COMMUNITY): Payer: Medicare Other

## 2013-12-25 ENCOUNTER — Encounter (HOSPITAL_COMMUNITY): Payer: Medicare Other

## 2013-12-25 NOTE — Telephone Encounter (Signed)
I spoke to Lakeside-Beebe RunDanielle, [facility].  Questioning the Pletal to stop 4 days prior to LP (scheduled on 01-04-14).  I contacted the pt and he , being a retired Teacher, early years/prepharmacist, states that this is not a blood thinner.  I will ask Dr. Vickey Hugerohmeier and from there it may be a MD to radiologist conference.

## 2013-12-26 ENCOUNTER — Telehealth: Payer: Self-pay | Admitting: Internal Medicine

## 2013-12-26 ENCOUNTER — Telehealth: Payer: Self-pay | Admitting: Neurology

## 2013-12-26 ENCOUNTER — Ambulatory Visit: Payer: Medicare Other | Admitting: Adult Health

## 2013-12-26 NOTE — Telephone Encounter (Signed)
New message   Office calling have questions regarding medication  pletal  50 mg

## 2013-12-26 NOTE — Telephone Encounter (Signed)
lmtcb Debbie Jerrod Damiano RN  

## 2013-12-26 NOTE — Telephone Encounter (Signed)
Called [facility] and LM about Pletal (stop for LP?)

## 2013-12-26 NOTE — Telephone Encounter (Signed)
Debbie, triage nurse from Dr. Jenel LucksAllred's office calling to return Sandy's call.

## 2013-12-26 NOTE — Telephone Encounter (Signed)
Azzie GlatterMia Quintero, daughter, calling about making f/u appt for going over results of LP scheduled 01-04-14.  [phone]

## 2013-12-27 ENCOUNTER — Encounter (HOSPITAL_COMMUNITY): Payer: Medicare Other

## 2013-12-27 NOTE — Telephone Encounter (Signed)
I called and Eunice BlaseDebbie is out today.  Will contact tomorrow.

## 2013-12-28 NOTE — Telephone Encounter (Signed)
[facility]le [facility]:  Does pt need to also hold Pletal 4 days prior to lumbar puncture (01/04/14)?  Forwarded to Dr. Johney FrameAllred for review Mylo Redebbie Jamas Jaquay RN

## 2013-12-28 NOTE — Telephone Encounter (Signed)
I called and spoke to Eunice Blaseebbie, Charity fundraiserN at [facility].  She will send message to Dr. Johney FrameAllred and get clarification re: Pletal.

## 2013-12-28 NOTE — Telephone Encounter (Signed)
See other note

## 2013-12-29 ENCOUNTER — Telehealth: Payer: Self-pay | Admitting: Neurology

## 2013-12-29 ENCOUNTER — Ambulatory Visit (INDEPENDENT_AMBULATORY_CARE_PROVIDER_SITE_OTHER): Payer: Medicare Other

## 2013-12-29 ENCOUNTER — Encounter (HOSPITAL_COMMUNITY): Payer: Medicare Other

## 2013-12-29 DIAGNOSIS — Z5181 Encounter for therapeutic drug level monitoring: Secondary | ICD-10-CM

## 2013-12-29 DIAGNOSIS — I350 Nonrheumatic aortic (valve) stenosis: Secondary | ICD-10-CM

## 2013-12-29 DIAGNOSIS — Z954 Presence of other heart-valve replacement: Secondary | ICD-10-CM

## 2013-12-29 DIAGNOSIS — I359 Nonrheumatic aortic valve disorder, unspecified: Secondary | ICD-10-CM

## 2013-12-29 DIAGNOSIS — I4891 Unspecified atrial fibrillation: Secondary | ICD-10-CM

## 2013-12-29 LAB — POCT INR: INR: 1.6

## 2013-12-29 NOTE — Telephone Encounter (Signed)
Per Dr. Vickey Hugerohmeier, it is ok for the patient to stop the Pletal along with the Coumadin and the aspirin 4 days prior to 01/04/14 for the lumbar puncture.  Danielle at [facility] was notified.

## 2013-12-29 NOTE — Telephone Encounter (Signed)
Dr. Vickey Hugerohmeier did speak to the patient's daughter and answered questions that they had about the LP and his medication.  I called next door to PT and [facility], the Physical therapist will be back in the office on Monday and I will call over and get another date for the LP and post PT.

## 2013-12-29 NOTE — Telephone Encounter (Signed)
Dr. Jenel LucksAllred's office did call back and said it is ok for the patient to stop his Pletal-   FYI.

## 2013-12-29 NOTE — Telephone Encounter (Signed)
Called [facility] for an answer on the Pletal, to see if the patient is ok to stop that for 4 days along with the Coumadin and the aspirin for his LP that is scheduled for 01/04/14.

## 2013-12-29 NOTE — Telephone Encounter (Signed)
Annabelle HarmanDana called from [facility] stating [name] is scheduled for lumbar puncture next Thurs.  His Coumadin and ASA can be stopped and was wondering if Pletal could be stopped also.  Spoke w/ Waynard ReedsSally Earle, pharmacist who states can be stopped also.  Notified [name].

## 2013-12-29 NOTE — Telephone Encounter (Signed)
Follow up     Office needs clearance to stop pletal 50mg  for procedure scheduled for thurs.

## 2013-12-29 NOTE — Telephone Encounter (Signed)
Danielle with [facility] requesting a return call today by Lupita Leashonna.  Please return call @ [phone].  Thanks

## 2013-12-29 NOTE — Telephone Encounter (Signed)
Patient's daughter, Pedro EarlsMia, states family has some questions regarding the patient's upcoming lumbar puncture and when to stop the Coumadin. Please call [phone] to advise.

## 2014-01-01 ENCOUNTER — Encounter (HOSPITAL_COMMUNITY): Payer: Medicare Other

## 2014-01-02 ENCOUNTER — Ambulatory Visit: Payer: Medicare Other | Admitting: Neurology

## 2014-01-02 ENCOUNTER — Telehealth: Payer: Self-pay | Admitting: Neurology

## 2014-01-02 NOTE — Telephone Encounter (Signed)
I called back.  Spoke with Bonita QuinLinda.  The original Rx was sent for titration pack with 12 refills.  She wanted to make sure it was okay to dispense the maintenance dose after titration complete.  Advised the patient could start main dose after titration is completed.

## 2014-01-02 NOTE — Telephone Encounter (Signed)
Adam [facility]Michelle [facility] calling to get clarification about patient's new Namenda script, please return call as soon as possible and advise.

## 2014-01-02 NOTE — Telephone Encounter (Signed)
The Physical therapist from next door Margretta DittyChristina Weaver has given me two dates that she could possibly do the Lp.  That information was [facility] to [facility] and I am waiting on a return call to see if those dates would work for her or not.

## 2014-01-03 ENCOUNTER — Encounter (HOSPITAL_COMMUNITY): Payer: Medicare Other

## 2014-01-03 ENCOUNTER — Telehealth: Payer: Self-pay | Admitting: Neurology

## 2014-01-03 NOTE — Telephone Encounter (Signed)
The appointment has been scheduled for 01/09/2014 at 11:15 am,  And the post assessment with the PT is at 12:30 pm.  The daughter is concerned about the patient having to come next door for the PT and why can't the Pt be at the bedside to do the assessment.  Please advise the patient's daughter [name] [phone].

## 2014-01-03 NOTE — Telephone Encounter (Signed)
I called the pharmacy, spoke with Bonita QuinLinda.  She said they have had the Rx ready for pick up since yesterday.  Says the patient may have gotten a delayed automated message, but they can disregard this.  [name] is not listed in contacts under HIPAA, so I called the patient 's home.  They are aware Rx is ready and will pick it up from the pharmacy.  Stated nothing further is needed at this time.

## 2014-01-03 NOTE — Telephone Encounter (Signed)
Patient's daughter Pedro EarlsMia calling to get clarification about patient's Namenda refill, they are still having trouble getting it. Please return call to daughter and advise.

## 2014-01-03 NOTE — Telephone Encounter (Signed)
Patient's daughter Pedro EarlsMia is following up on the status of the scheduling of patient's lumbar puncture. Please return call and advise.

## 2014-01-04 ENCOUNTER — Other Ambulatory Visit: Payer: Medicare Other

## 2014-01-04 ENCOUNTER — Ambulatory Visit: Payer: Medicare Other | Attending: Neurology | Admitting: Rehabilitative and Restorative Service Providers"

## 2014-01-04 DIAGNOSIS — F05 Delirium due to known physiological condition: Secondary | ICD-10-CM

## 2014-01-04 DIAGNOSIS — R413 Other amnesia: Secondary | ICD-10-CM | POA: Diagnosis not present

## 2014-01-04 DIAGNOSIS — R269 Unspecified abnormalities of gait and mobility: Secondary | ICD-10-CM | POA: Diagnosis not present

## 2014-01-04 DIAGNOSIS — M48061 Spinal stenosis, lumbar region without neurogenic claudication: Secondary | ICD-10-CM | POA: Insufficient documentation

## 2014-01-04 DIAGNOSIS — G9389 Other specified disorders of brain: Secondary | ICD-10-CM | POA: Insufficient documentation

## 2014-01-04 DIAGNOSIS — R410 Disorientation, unspecified: Secondary | ICD-10-CM | POA: Insufficient documentation

## 2014-01-04 DIAGNOSIS — IMO0001 Reserved for inherently not codable concepts without codable children: Secondary | ICD-10-CM | POA: Insufficient documentation

## 2014-01-04 DIAGNOSIS — R159 Full incontinence of feces: Secondary | ICD-10-CM | POA: Diagnosis not present

## 2014-01-04 NOTE — Telephone Encounter (Signed)
Left message for Duwayne HeckDanielle, I see patient has been scheduled for LP on 01-09-14.  Please see other phone notes from 12-29-13.

## 2014-01-05 ENCOUNTER — Encounter (HOSPITAL_COMMUNITY): Payer: Medicare Other

## 2014-01-05 NOTE — Telephone Encounter (Signed)
i spoke to her in the lobby and explained.

## 2014-01-08 ENCOUNTER — Telehealth: Payer: Self-pay | Admitting: Neurology

## 2014-01-08 ENCOUNTER — Encounter (HOSPITAL_COMMUNITY): Payer: Medicare Other

## 2014-01-08 NOTE — Telephone Encounter (Signed)
Spoke with Duwayne HeckDanielle, has been taken care of by Janne Napoleonana S.

## 2014-01-08 NOTE — Telephone Encounter (Signed)
Danielle from [facility] calling regarding patient, please return call and advise.

## 2014-01-09 ENCOUNTER — Inpatient Hospital Stay: Admission: RE | Admit: 2014-01-09 | Payer: Medicare Other | Source: Ambulatory Visit

## 2014-01-09 ENCOUNTER — Ambulatory Visit: Payer: Medicare Other | Admitting: Rehabilitative and Restorative Service Providers"

## 2014-01-10 ENCOUNTER — Encounter (HOSPITAL_COMMUNITY): Payer: Medicare Other

## 2014-01-12 ENCOUNTER — Other Ambulatory Visit: Payer: Self-pay | Admitting: Neurology

## 2014-01-12 ENCOUNTER — Encounter (HOSPITAL_COMMUNITY): Payer: Medicare Other

## 2014-01-12 ENCOUNTER — Telehealth: Payer: Self-pay | Admitting: Neurology

## 2014-01-12 DIAGNOSIS — G912 (Idiopathic) normal pressure hydrocephalus: Secondary | ICD-10-CM

## 2014-01-12 DIAGNOSIS — R413 Other amnesia: Secondary | ICD-10-CM

## 2014-01-12 DIAGNOSIS — R269 Unspecified abnormalities of gait and mobility: Secondary | ICD-10-CM

## 2014-01-12 NOTE — Telephone Encounter (Signed)
[facility]le from [facility] calling to ask if any labwork is needed for patient, please return call and advise.

## 2014-01-12 NOTE — Telephone Encounter (Signed)
Spoke to [place]le and gave verbal order and entered lab orders, per Dr. Marko Staiohmeiers protocol for LP - NPH.

## 2014-01-15 ENCOUNTER — Ambulatory Visit (INDEPENDENT_AMBULATORY_CARE_PROVIDER_SITE_OTHER): Payer: Medicare Other | Admitting: Pharmacist

## 2014-01-15 ENCOUNTER — Encounter (HOSPITAL_COMMUNITY): Payer: Medicare Other

## 2014-01-15 ENCOUNTER — Ambulatory Visit: Payer: Medicare Other | Admitting: Rehabilitative and Restorative Service Providers"

## 2014-01-15 ENCOUNTER — Ambulatory Visit
Admission: RE | Admit: 2014-01-15 | Discharge: 2014-01-15 | Disposition: A | Discharge status: Home / Self Care | Payer: Medicare Other | Source: Ambulatory Visit | Attending: Neurology | Admitting: Neurology

## 2014-01-15 DIAGNOSIS — I4891 Unspecified atrial fibrillation: Secondary | ICD-10-CM

## 2014-01-15 DIAGNOSIS — IMO0001 Reserved for inherently not codable concepts without codable children: Secondary | ICD-10-CM | POA: Diagnosis not present

## 2014-01-15 DIAGNOSIS — R413 Other amnesia: Secondary | ICD-10-CM

## 2014-01-15 DIAGNOSIS — Z5181 Encounter for therapeutic drug level monitoring: Secondary | ICD-10-CM

## 2014-01-15 DIAGNOSIS — I359 Nonrheumatic aortic valve disorder, unspecified: Secondary | ICD-10-CM

## 2014-01-15 DIAGNOSIS — R159 Full incontinence of feces: Secondary | ICD-10-CM

## 2014-01-15 DIAGNOSIS — R269 Unspecified abnormalities of gait and mobility: Secondary | ICD-10-CM

## 2014-01-15 DIAGNOSIS — I10 Essential (primary) hypertension: Secondary | ICD-10-CM

## 2014-01-15 DIAGNOSIS — Z954 Presence of other heart-valve replacement: Secondary | ICD-10-CM

## 2014-01-15 DIAGNOSIS — R410 Disorientation, unspecified: Secondary | ICD-10-CM

## 2014-01-15 DIAGNOSIS — F05 Delirium due to known physiological condition: Secondary | ICD-10-CM

## 2014-01-15 DIAGNOSIS — I350 Nonrheumatic aortic (valve) stenosis: Secondary | ICD-10-CM

## 2014-01-15 LAB — CSF CELL COUNT WITH DIFFERENTIAL
RBC Count, CSF: 125 cu mm — ABNORMAL HIGH
TUBE #: 1
WBC, CSF: 2 cu mm (ref 0–5)

## 2014-01-15 LAB — GLUCOSE, CSF: Glucose, CSF: 96 mg/dL — ABNORMAL HIGH (ref 43–76)

## 2014-01-15 LAB — CRYPTOCOCCAL ANTIGEN, CSF: Crypto Ag: NEGATIVE

## 2014-01-15 LAB — POCT INR: INR: 1.1

## 2014-01-15 LAB — PROTEIN, CSF: TOTAL PROTEIN, CSF: 58 mg/dL — AB (ref 15–45)

## 2014-01-15 NOTE — Discharge Instructions (Signed)
Lumbar Puncture Discharge Instructions  1. Go home and rest quietly for the next 24 hours.  It is important to lie flat for the next 24 hours.  Get up only to go to the restroom.  You may lie in the bed or on a couch on your back, your stomach, your left side or your right side.  You may have one pillow under your head.  You may have pillows between your knees while you are on your side or under your knees while you are on your back.  2. DO NOT drive today.  Recline the seat as far back as it will go, while still wearing your seat belt, on the way home.  3. You may get up to go to the bathroom as needed.  You may sit up for 10 minutes to eat.  You may resume your normal diet and medications unless otherwise indicated.  Drink plenty of extra fluids today and tomorrow.  4. The incidence of a spinal headache with nausea and/or vomiting is about 5% (one in 20 patients).  If you develop a headache, lie flat and drink plenty of fluids until the headache goes away.  Caffeinated beverages may be helpful.  If you develop severe nausea and vomiting or a headache that does not go away with flat bed rest, please call the physician who sent you here.  5. You may resume normal activities after your 24 hours of bed rest is over; however, do not exert yourself strongly or do any heavy lifting tomorrow.  Please call us at [phone] with any questions or concerns.  6. Call your physician for a follow-up appointment.   You may resume Coumadin and Pletal today.

## 2014-01-17 ENCOUNTER — Encounter (HOSPITAL_COMMUNITY): Payer: Medicare Other

## 2014-01-17 ENCOUNTER — Telehealth: Payer: Self-pay | Admitting: Neurology

## 2014-01-17 LAB — VDRL, CSF: SYPHILIS VDRL QUANT CSF: NONREACTIVE

## 2014-01-17 NOTE — Progress Notes (Signed)
Quick Note:  Spoke to patient's wife and relayed CSF results, per Dr. Vickey Hugerohmeier. Wife verbalized understanding and would like to know if patient needs a follow up appointment. ______

## 2014-01-17 NOTE — Telephone Encounter (Signed)
done

## 2014-01-19 ENCOUNTER — Encounter: Payer: Self-pay | Admitting: Nurse Practitioner

## 2014-01-19 ENCOUNTER — Encounter (HOSPITAL_COMMUNITY): Payer: Medicare Other

## 2014-01-19 ENCOUNTER — Ambulatory Visit (INDEPENDENT_AMBULATORY_CARE_PROVIDER_SITE_OTHER): Payer: Medicare Other | Admitting: Nurse Practitioner

## 2014-01-19 VITALS — BP 132/61 | HR 72 | Ht 64.5 in | Wt 172.0 lb

## 2014-01-19 DIAGNOSIS — G931 Anoxic brain damage, not elsewhere classified: Secondary | ICD-10-CM

## 2014-01-19 DIAGNOSIS — F0280 Dementia in other diseases classified elsewhere without behavioral disturbance: Secondary | ICD-10-CM

## 2014-01-19 DIAGNOSIS — G9389 Other specified disorders of brain: Secondary | ICD-10-CM

## 2014-01-19 DIAGNOSIS — G4733 Obstructive sleep apnea (adult) (pediatric): Secondary | ICD-10-CM

## 2014-01-19 DIAGNOSIS — F0151 Vascular dementia with behavioral disturbance: Secondary | ICD-10-CM

## 2014-01-19 DIAGNOSIS — R269 Unspecified abnormalities of gait and mobility: Secondary | ICD-10-CM

## 2014-01-19 DIAGNOSIS — I6789 Other cerebrovascular disease: Secondary | ICD-10-CM

## 2014-01-19 DIAGNOSIS — R9082 White matter disease, unspecified: Secondary | ICD-10-CM

## 2014-01-19 DIAGNOSIS — I679 Cerebrovascular disease, unspecified: Secondary | ICD-10-CM

## 2014-01-19 DIAGNOSIS — F0150 Vascular dementia without behavioral disturbance: Secondary | ICD-10-CM

## 2014-01-19 MED ORDER — MEMANTINE HCL 10 MG PO TABS: 10.0000 mg | ORAL_TABLET | Freq: Two times a day (BID) | ORAL | Status: DC

## 2014-01-19 MED ORDER — DONEPEZIL HCL 5 MG PO TABS: 5.0000 mg | ORAL_TABLET | Freq: Every day | ORAL | Status: DC

## 2014-01-19 NOTE — Patient Instructions (Signed)
Continue namenda, 10 mg twice a day. Start Aricept, 5 mg daily at bedtime.  If tolerated, increase to 10 mg daily in 1 month. Follow up in 3 months, sooner as needed.  Aricept (generic name: donepezil) 5 mg: take one pill each evening. Common side effects include dry eyes, dry mouth, diarrhea, low pulse, low blood pressure and rare side effects include hallucinations.

## 2014-01-19 NOTE — Progress Notes (Signed)
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 He is seen here as a referral from [name] for a confusion follow up:   Adam [name] comes to the office today for one-month followup accompanied by his wife. Since last visit, lumbar puncture was ordered with pre-and post physical therapy evaluation to evaluate for normal pressure hydrocephalus. LP was performed on 01/15/14 with before and after PT evaluation, without change in performance, thus ruling out NPH. He continues to be socially depended but independent home. His gait is mildly unstable but independent without use of assisted device. MMSE pre-LP was 28/30; MMSE post LP was 27/30. AFT both pre-and post were 3 only. He nor his wife have any complaints today.  12/05/13 (CD) Last visit note.  Adam [name] family reports that the patient has lost weight unintentionally neutral a loss of interest in eating or loss of appetite. He also appears to have periods of apraxia and confusion. His wife and daughter are here today with Adam [name] and reported the last month was very difficult especially the first week of 2 seem to have been a peak for cognitive impairment and confusion. The couple for the saw [name] cord reduced some of the medications that he felt could have affected the cognition. The patient still has Ambien 5 mg at night ordered, he does have insulin, blood pressure medication hyperlipidemia peptic medication and Pletal. Is on Effexor in a generic form is longer on a statin medication he still has this breather and Symbicort airway treatment available but hasn't been using those recently. Worsening confusion led to an emergency visit on June 8 his workup included normal vital signs and pulse oxygen saturation capillary blood glucose was level  was 318 mmol chest x-ray showed some right lower lobe airspace disease which was normal before. CT scan of her had short more moderate atrophy but no acute changes and given normal EKG. He was started on an antibiotic for possible pneumonia and then seen in followup by Dr. Jarold Motto. The patient is now finishing the antibiotic but a normal knee was never confirmed.  The patient was to be the geriatric depression score at 10 points at worst sleepiness score at 3 points and fatigue severity at 0. He scored 20 points on MMSE but was irriatble and agitated. He corrected himslef to the right date and Year. Lost 2 words in recall, unable to draw a clock , numbers or hands.   10/10/13 (CD) visit note: This patient is a pleasant , married, 25 your old right-handed Caucasian gentleman.  He is a retired Teacher, early years/pre , who was referred by Dr. Jarold Motto for evaluation of insomnia, snoring, and a sleep disorder that seems to have evolved since by bypass surgery 2004 or 2003 and progressed after he in March 2014 an aortic valve repair by Dr. Laneta Simmers.  Since surgery he feels more fatigued and short of breath, sluggish and forgetful. During cardiac rehab he made progress but he stopped the exercises.  I reviewed his medications, he confirmed that he takes these as indicated below.  He goes to bed at the same time week days and week ends. He is going to sleep at midnight, his wife will already be in bed by 10 PM. He will have trouble initiating sleep, denies having slept on sofa or recliner,  he watches TV in the bedroom, and disturbes his wife. He will need Ambien to initiate sleep, will go to the bedroom some nights 4-5 times. It is off all nocturnal sleep time at about 6 hours. He has always awoken spontaneously in the morning he does not need an alarm . His wife reports that he actually sleeps through the alarm until he wakes up spontaneously- between 11 and 12 in the morning.  The couples adult daughter lives with the  [name].  His wife endorsed the Epworth score for him : at 20 points, FSS at 50 points.  Mr [name] responses took a very long time, he appears bradykinetik and bradyphrenic.  He has lost the ability to screen the answering machine on his phone and to use his camera, apraxia. His wife has taken over balancing the check book. He is supposed to have dementia following an acute ischemic/hypoxic insult after heart valve.  Adam [name] was unable to initiate sleep in the lab during a sleep study in January 2015, After petitioning Medicare we were able to obtain a home sleep test. The test was done by the patient used his home oxygen supply it indicated an AHI of 12 but with persistence take tachybradycardia arrhythmias;  Based on this I believe that without oxygen he would have likely had an apnea index more than 100% higher than in his home sleep test on oxygen.  The study was interpreted on 07-27-13 and I ordered an auto-set CPAP in response , having a pressure window between 4 and 12 cm water with an EBI of 2 unfortunately his AHI is still 7.9. Review shows that days of high air leak ( over 80 liters) are those of high AHI. Excluding these , the AHI is 5.5 /hr.  The patient had excellent compliance of 100% over 30 out of 30 days, 8 hours and 13 minutes on average daily use.  He also is less daytime sleepy. He was not aware of how sleepy he was. He noticed that he can actually follow conversations better and is not falling asleep when not stimulated.  He is short of breath while speaking to me. His voice is raspy . His wife states here he is now napping throughout the day, often for several hours. He is snoring loudly and she witnessed some crescendo snoring, raising suspicions for OSA.  Mr [name] reports he has several bathroom breaks a day, attributed this to age, he has morning headaches. He has no REM BD, no sleep walking. But he is often confused in the morning. He has lost interest in reading ,  conversations he reads short magazine articles. He watches more TV. His personality has changed since he is sleepier or because of the sleepiness. He never bounced back from surgery.  Mr. Hennes underwent a sleep study on 06-28-13, he has been using benzodiazepines for a long time for sleep induction but felt not excessively daytime sleepy. He had a large neck circumference which would cause him at higher risk of sleep apnea, and a BMI of 31. The sleep study was not successful as the patient was unable to sleep in spite of having taken his medication. So he felt that he was not paradoxically stimulated by the sleep aid. Based on this I have asked for his insurance to permit an exception for a home sleep test.  Based on the additional complaint of memory loss an MRI of the brain have been obtained dated 06-09-13 which showed moderate changes of chronic microvascular  changes and generalized cerebral atrophy.  We also obtained an Mini-Mental status exam in January 2015 on this patient, a retired Teacher, early years/prepharmacist, who scored 22/ 30 points. A Montral cognitive assessment test was aborted after the visual spatial questions were not been successfully solved.  He has no REM BD according to his spouse. He has moderate dementia based on these test results.   Review of Systems:  Out of a complete 14 system review, the patient complains of only the following symptoms, and all other reviewed systems are negative.  No complaints today.    ALLERGIES: Allergies  Allergen Reactions  . Doxycycline Swelling    swollen tongue    HOME MEDICATIONS: Outpatient Prescriptions Prior to Visit  Medication Sig Dispense Refill  . albuterol (PROVENTIL HFA;VENTOLIN HFA) 108 (90 BASE) MCG/ACT inhaler Inhale 2 puffs into the lungs every 6 (six) hours as needed for wheezing.  1 Inhaler  6  . aspirin EC 325 MG EC tablet Take 1 tablet (325 mg total) by mouth daily.  30 tablet    . cilostazol (PLETAL) 50 MG tablet Take 1 tablet by mouth  Twice daily.      . fluticasone (FLONASE) 50 MCG/ACT nasal spray Place 1-2 sprays into both nostrils daily.       . insulin glargine (LANTUS) 100 UNIT/ML injection Inject 20 Units into the skin at bedtime.       . insulin lispro (HUMALOG) 100 UNIT/ML injection Inject 5 Units into the skin as needed for high blood sugar.      . losartan (COZAAR) 50 MG tablet Take 1 tablet (50 mg total) by mouth daily.  90 tablet  3  . metFORMIN (GLUCOPHAGE-XR) 500 MG 24 hr tablet Take 1,000 mg by mouth at bedtime.        . metoprolol (LOPRESSOR) 50 MG tablet Take 1 tablet (50 mg total) by mouth 2 (two) times daily.  180 tablet  1  . Tamsulosin HCl (FLOMAX) 0.4 MG CAPS Take 0.4 mg by mouth at bedtime.       [name]. venlafaxine (EFFEXOR) 75 MG tablet Take 150mg  in the morning and 75mg  at bedtime.      [name]. warfarin (COUMADIN) 5 MG tablet Take as directed by coumadin clinic  150 tablet  1  . zolpidem (AMBIEN) 5 MG tablet Take 5 mg by mouth at bedtime.      . memantine (NAMENDA TITRATION PAK) tablet pack 5 mg/day for =1 week; 5 mg twice daily for =1 week; 15 mg/day given in 5 mg and 10 mg separated doses for =1 week; then 10 mg twice daily  49 tablet  12  . NITROSTAT 0.4 MG SL tablet as needed.      . simvastatin (ZOCOR) 10 MG tablet Take 1 tablet (10 mg total) by mouth at bedtime.  90 tablet  3  . amoxicillin (AMOXIL) 500 MG capsule Take 2 capsules (1,000 mg total) by mouth 2 (two) times daily.  40 capsule  0   No facility-administered medications prior to visit.    PHYSICAL EXAM Filed Vitals:   01/19/14 1415  BP: 132/61  Pulse: 72  Height: 5' 4.5" (1.638 m)  Weight: 172 lb (78.019 kg)   Body mass index is 29.08 kg/(m^2). No exam data present Montreal Cognitive Assessment  01/15/2014  Visuospatial/ Executive (0/5) 2  Naming (0/3) 2  Attention: Read list of digits (0/2) 1  Attention: Read list of letters (0/1) 1  Attention: Serial 7 subtraction starting at 100 (0/3) 1  Language:  Repeat phrase (0/2) 1  Language :  Fluency (0/1) 0  Abstraction (0/2) 1  Delayed Recall (0/5) 0  Orientation (0/6) 6  Total 15  Adjusted Score (based on education) 15    MMSE - Mini Mental State Exam 01/15/2014  Orientation to time 5  Orientation to Place 5  Registration 3  Attention/ Calculation 5  Recall 1  Language- name 2 objects 2  Language- repeat 1  Language- follow 3 step command 2  Language- read & follow direction 1  Write a sentence 1  Copy design 1  Total score 27    Physical exam:  General: The patient is awake, alert and appears not in acute distress. The patient is well groomed.  Head: Normocephalic, atraumatic. Neck is supple. Mallampati 4, neck circumference:18 , no TMJ -  Cardiovascular: Regular rate and rhythm , without carotid bruit, and without distended neck veins.  Respiratory: Lungs are clear to auscultation.  Trunk: BMI is elevated.  Neurologic exam:  The patient is sluggish, slow and alert, oriented to place and time.  Memory subjective described as impaired. There is an abnormal attention span & concentration ability. He perseverates.  Speech is fluent without Dysarthria, but with dysphonia .  Mood and affect are appropriate.  Cranial nerves:  Pupils are equal and briskly reactive to light. Funduscopic exam without evidence of pallor or edema. Extraocular movements in vertical and horizontal planes intact and without nystagmus. Visual fields by finger perimetry are intact.  Hearing to finger rub intact. Facial sensation intact to fine touch. Facial motor strength is symmetric and tongue move midline. Left uvula lower, he has a drooping mouth, open while speaking, sitting at rest.  Motor exam: Increased rigidity, tone, gegenhalten and delayed response to motor commands.  Sensory: Fine touch, pinprick and vibration were tested in all extremities.  Coordination: Rapid alternating movements in the fingers/hands is tested and normal.  Finger-to-nose is slow without evidence of ataxia,  dysmetria or tremor.  Gait and station: Patient walks without assistive device.  Slow ambulation, bend forwards, wide-based and slow. Stance is wide based. Tandem gait is impossible. Romberg testing is abnormal. Turned with 3 steps.  Deep tendon reflexes: in the upper and lower extremities are symmetric and intact. Babinski maneuver response is downgoing.   ASSESSMENT: 79 y.o. 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 OSA-it was decided to discontinue his CPAP at last visit.  He is progressively more and more demented and confused. His CPAP may not help him much giving the difficulties using it at an AHI of only 10. He needs his oxygen supplement.  2. Vascular dementia versus protracted effect of peri-surgical hypoxemia. MRI with generalized atrophy and microvascular disease. There are some features of parkinsonism. He is realizing his cognitive impairment but underestimates the degree.  3. Gait instability, Wide based-masked face, cognitive decline but not RLS, REM BD, and no tremor at rest. Can be due to spinal stenosis and/or glucose levels.  PLAN: Continue namenda, 10 mg twice a day. Start Aricept, 5 mg daily at bedtime. After 1 month, if well tolerated, increase to 10 mg daily.  Follow up in 3 months, sooner as needed.  Meds ordered this encounter  Medications  . donepezil (ARICEPT) 5 MG tablet    Sig: Take 1 tablet (5 mg total) by mouth at bedtime.    Dispense:  30  tablet    Refill:  0    Order Specific Question:  Supervising Provider    Answer:  DOHMEIER, CARMEN [[id number]]  . memantine (NAMENDA) 10 MG tablet    Sig: Take 1 tablet (10 mg total) by mouth 2 (two) times daily.    Order Specific Question:     Ronal Fear, MSN, FNP-BC, A/GNP-C 01/20/2014, 1:32 PM [facility] [address] [place], [place] [address] [phone]  Note: This document was prepared with digital dictation and possible smart phrase technology. Any transcriptional errors that result from this process are unintentional.

## 2014-01-20 MED ORDER — DONEPEZIL HCL 10 MG PO TABS
10.0000 mg | ORAL_TABLET | Freq: Every day | ORAL | Status: DC
Start: 2014-02-20 — End: 2014-01-23

## 2014-01-22 ENCOUNTER — Telehealth: Payer: Self-pay | Admitting: Nurse Practitioner

## 2014-01-22 ENCOUNTER — Encounter (HOSPITAL_COMMUNITY): Payer: Medicare Other

## 2014-01-22 NOTE — Progress Notes (Signed)
I agree with the assessment and plan as directed by NP .The patient is known to me .   Montee Tallman, MD  

## 2014-01-22 NOTE — Telephone Encounter (Signed)
Please call and advise the patient to stop the medication if it has been persistent.

## 2014-01-22 NOTE — Telephone Encounter (Signed)
Pt calls because during a recent office visit he was placed on Aricept.  He has recently stated experiencing diarrhea since starting the medication.  Please advise pt whether to continue or discontinue taking.

## 2014-01-23 ENCOUNTER — Other Ambulatory Visit: Payer: Self-pay | Admitting: Nurse Practitioner

## 2014-01-23 MED ORDER — RIVASTIGMINE 4.6 MG/24HR TD PT24: 4.6000 mg | MEDICATED_PATCH | Freq: Every day | TRANSDERMAL | Status: DC

## 2014-01-23 NOTE — Telephone Encounter (Signed)
Called pt to inform him per Larita FifeLynn, NP to stop the medication Aricept if the diarrhea is persistent. Pt stated that he stopped taking the medication on Sunday and the diarrhea cleared up. Pt wanted to know if Larita FifeLynn, NP would prescribe the patches instead. Please advise.

## 2014-01-23 NOTE — Progress Notes (Signed)
Aricept caused diarrhea so patient would like to try Exelon patch.  I have sent an Rx to [facility].

## 2014-01-23 NOTE — Telephone Encounter (Signed)
Ok, I sent in a 1 month Rx to [facility] to try it. Please let him know.  Side effects could be skin irritation, just ask that he doesn't place it in the same spot every day.  Common places are shoulders and back, or back of upper arm.  Must always remove old patch before using new one.

## 2014-01-23 NOTE — Telephone Encounter (Signed)
Called pt to inform him per Larita FifeLynn, NP that [facility] and wanted the pt to try it and that it could be side effects like skin irritation and not to use patch in the same place every day but to change place and common places are shoulders and back, or back of upper arm and to remember to remove old patch first before placing new one. I advised the pt that if he has any other problems, questions or concerns to call the office. Pt verbalized understanding.

## 2014-01-24 ENCOUNTER — Encounter (HOSPITAL_COMMUNITY): Payer: Medicare Other

## 2014-01-25 ENCOUNTER — Ambulatory Visit (INDEPENDENT_AMBULATORY_CARE_PROVIDER_SITE_OTHER): Payer: Medicare Other | Admitting: *Deleted

## 2014-01-25 DIAGNOSIS — I350 Nonrheumatic aortic (valve) stenosis: Secondary | ICD-10-CM

## 2014-01-25 DIAGNOSIS — Z5181 Encounter for therapeutic drug level monitoring: Secondary | ICD-10-CM

## 2014-01-25 DIAGNOSIS — I359 Nonrheumatic aortic valve disorder, unspecified: Secondary | ICD-10-CM

## 2014-01-25 DIAGNOSIS — I4891 Unspecified atrial fibrillation: Secondary | ICD-10-CM

## 2014-01-25 DIAGNOSIS — Z954 Presence of other heart-valve replacement: Secondary | ICD-10-CM

## 2014-01-25 LAB — POCT INR: INR: 1.8

## 2014-01-26 ENCOUNTER — Encounter (HOSPITAL_COMMUNITY): Payer: Medicare Other

## 2014-01-29 ENCOUNTER — Encounter (HOSPITAL_COMMUNITY): Payer: Medicare Other

## 2014-01-30 ENCOUNTER — Ambulatory Visit: Payer: Medicare Other | Admitting: Nurse Practitioner

## 2014-01-31 ENCOUNTER — Encounter (HOSPITAL_COMMUNITY): Payer: Medicare Other

## 2014-02-02 ENCOUNTER — Encounter (HOSPITAL_COMMUNITY): Payer: Medicare Other

## 2014-02-05 ENCOUNTER — Encounter (HOSPITAL_COMMUNITY): Payer: Medicare Other

## 2014-02-06 ENCOUNTER — Ambulatory Visit (INDEPENDENT_AMBULATORY_CARE_PROVIDER_SITE_OTHER): Payer: Medicare Other | Admitting: Internal Medicine

## 2014-02-06 ENCOUNTER — Encounter: Payer: Self-pay | Admitting: Pulmonary Disease

## 2014-02-06 ENCOUNTER — Encounter: Payer: Self-pay | Admitting: Internal Medicine

## 2014-02-06 ENCOUNTER — Ambulatory Visit (INDEPENDENT_AMBULATORY_CARE_PROVIDER_SITE_OTHER): Payer: Medicare Other | Admitting: Pulmonary Disease

## 2014-02-06 VITALS — BP 140/80 | HR 71 | Temp 97.9°F | Ht 65.0 in | Wt 173.8 lb

## 2014-02-06 VITALS — Ht 64.5 in | Wt 172.0 lb

## 2014-02-06 DIAGNOSIS — J439 Emphysema, unspecified: Secondary | ICD-10-CM

## 2014-02-06 DIAGNOSIS — J438 Other emphysema: Secondary | ICD-10-CM

## 2014-02-06 DIAGNOSIS — R933 Abnormal findings on diagnostic imaging of other parts of digestive tract: Secondary | ICD-10-CM

## 2014-02-06 DIAGNOSIS — R1314 Dysphagia, pharyngoesophageal phase: Secondary | ICD-10-CM

## 2014-02-06 DIAGNOSIS — R1319 Other dysphagia: Secondary | ICD-10-CM

## 2014-02-06 DIAGNOSIS — R1310 Dysphagia, unspecified: Secondary | ICD-10-CM

## 2014-02-06 DIAGNOSIS — Z1211 Encounter for screening for malignant neoplasm of colon: Secondary | ICD-10-CM

## 2014-02-06 NOTE — Patient Instructions (Addendum)
Follow up with Dr. Rhea BeltonPyrtle as needed.  cc: Jarome Matinaniel Paterson, MD

## 2014-02-06 NOTE — Progress Notes (Signed)
Patient ID: Adam [name], male   DOB: 03-Mar-1935, 79 y.o.   MRN: [id number] HPI: Adam [name] is a 79 year old male with a past medical history of CAD status post CABG, aortic stenosis with bioprosthetic valve replacement, hypertension, hyperlipidemia, diabetes, atrial fibrillation on warfarin, OSA, and recent neurology evaluation for vascular dementia who is seen in consultation at the request of Dr. Jarome Matin to evaluate dysphagia. He is here today with his wife. He reports he does not know of any trouble swallowing, and his wife supports this claim. He had a barium swallow performed in early June 20 15th which revealed a smooth distal esophageal tapering with transient delay in passage of a 15 mm barium tablet. Today they are patient reports he has no dysphagia or odynophagia. No trouble with solids or liquids. He reports he takes "a handful of pills twice a day" at the same time and this goes down easily. His wife states he has not complained of solid or liquid food dysphagia. He denies abdominal pain. No nausea or vomiting. He reports he used to have reflux and heartburn when he was working but this is no longer an issue. He uses Zantac on occasion and he estimates once every other month. Reports regular bowel movements without blood or melena. No diarrhea or constipation. He recently had a lumbar puncture to evaluate for normal pressure hydrocephalus and this was negative.  Past Medical History  Diagnosis Date  . Hypertension   . Hyperlipidemia   . Type II or unspecified type diabetes mellitus without mention of complication, not stated as uncontrolled   . Nephrolithiasis   . Atrial flutter     s/p CTI ablation by Dr Ladona Ridgel 1/12  . CAD (coronary artery disease)     s/p CABG x 5 2003 by Dr Laneta Simmers  . Aortic stenosis, moderate     moderate to severe per echo 03/2012 with AVR in March of 2014  . DJD (degenerative joint disease)   . DDD (degenerative disc disease)   . Spinal stenosis of  lumbar region   . Bifascicular block   . Atrial tachycardia   . Anxiety   . Depression   . S/P AVR (aortic valve replacement)   . OSA (obstructive sleep apnea)   . 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 Diverticulosis   . Hemorrhoids     Past Surgical History  Procedure Laterality Date  . Cabg  2003    by Dr Laneta Simmers  . Anal fissure repair    . Leg surgery      right  . Trigger finger release      bilat.  . Tonsillectomy    . Back surgery  2010  . Atrial ablation surgery  1/12    atrial flutter ablation by Dr Ladona Ridgel  . Cardioversion  04/07/2012    Procedure: CARDIOVERSION;  Surgeon: Wendall Stade, MD;  [facility] ENDOSCOPY;  Service: Cardiovascular;  Laterality: N/A;  . Coronary artery bypass graft      10 yrs ago   03  . Aortic valve replacement N/A 09/01/2012    Procedure: AORTIC VALVE REPLACEMENT (AVR);  Surgeon: Alleen Borne, MD;  Location: [facility] OR;  Service: Open Heart Surgery;  Laterality: N/A;  . Intraoperative transesophageal echocardiogram N/A 09/01/2012    Procedure: INTRAOPERATIVE TRANSESOPHAGEAL ECHOCARDIOGRAM;  Surgeon: Alleen Borne, MD;  Location: [facility] OR;  Service: Open Heart Surgery;  Laterality: N/A;    Outpatient Prescriptions Prior to Visit  Medication Sig Dispense Refill  .  albuterol (PROVENTIL HFA;VENTOLIN HFA) 108 (90 BASE) MCG/ACT inhaler Inhale 2 puffs into the lungs every 6 (six) hours as needed for wheezing.  1 Inhaler  6  . aspirin EC 325 MG EC tablet Take 1 tablet (325 mg total) by mouth daily.  30 tablet    . CARTIA XT 240 MG 24 hr capsule Take 1 capsule by mouth daily.      . cilostazol (PLETAL) 50 MG tablet Take 1 tablet by mouth Twice daily.      . fluticasone (FLONASE) 50 MCG/ACT nasal spray Place 1-2 sprays into both nostrils daily.       . insulin glargine (LANTUS) 100 UNIT/ML injection Inject 20 Units into the skin at bedtime.       . insulin lispro (HUMALOG) 100 UNIT/ML injection Inject 5 Units into the skin as needed for  high blood sugar.      [name]. LORazepam (ATIVAN) 1 MG tablet Take 1 tablet by mouth at bedtime as needed and may repeat dose one time if needed.      [name]. losartan (COZAAR) 50 MG tablet Take 1 tablet (50 mg total) by mouth daily.  90 tablet  3  . memantine (NAMENDA) 10 MG tablet Take 1 tablet (10 mg total) by mouth 2 (two) times daily.      . metFORMIN (GLUCOPHAGE-XR) 500 MG 24 hr tablet Take 1,000 mg by mouth at bedtime.        . metoprolol (LOPRESSOR) 50 MG tablet Take 1 tablet (50 mg total) by mouth 2 (two) times daily.  180 tablet  1  . NITROSTAT 0.4 MG SL tablet as needed.      . simvastatin (ZOCOR) 10 MG tablet Take 1 tablet (10 mg total) by mouth at bedtime.  90 tablet  3  . Tamsulosin HCl (FLOMAX) 0.4 MG CAPS Take 0.4 mg by mouth at bedtime.       [name]. venlafaxine (EFFEXOR) 75 MG tablet Take 150mg  in the morning and 75mg  at bedtime.      [name]. warfarin (COUMADIN) 5 MG tablet Take as directed by coumadin clinic  150 tablet  1  . zolpidem (AMBIEN) 5 MG tablet Take 5 mg by mouth at bedtime.      . rivastigmine (EXELON) 4.6 mg/24hr Place 1 patch (4.6 mg total) onto the skin daily.  30 patch  0   No facility-administered medications prior to visit.    Allergies  Allergen Reactions  . Doxycycline Swelling    swollen tongue    Family History  Problem Relation Age of Onset  . Heart disease Father   . Heart disease Paternal Grandfather   . Stroke Mother   . Rheum arthritis Mother   . Colon cancer Neg Hx   . Stomach cancer Neg Hx     History  Substance Use Topics  . Smoking status: Former Smoker -- 1.00 packs/day for 49 years    Quit date: 08/21/2001  . Smokeless tobacco: Never Used     Comment: started at age 79.   [name]. Alcohol Use: No     Comment: quit 15 yrs ago    ROS: As per history of present illness, otherwise negative  Ht 5' 4.5" (1.638 m)  Wt 172 lb (78.019 kg)  BMI 29.08 kg/m2 Constitutional: Well-developed and well-nourished. No distress. HEENT: Normocephalic and atraumatic.  Oropharynx is clear and moist. No oropharyngeal exudate. Conjunctivae are normal.  No scleral icterus. Upper and lower dentures Neck: Neck supple. Trachea midline. Cardiovascular: Normal rate, regular rhythm  and intact distal pulses. 2/6 sem Pulmonary/chest: Effort normal and breath sounds normal. No wheezing, rales or rhonchi. Abdominal: Soft, nontender, nondistended. Bowel sounds active throughout. Extremities: no clubbing, cyanosis, or edema Skin: Skin is warm and dry. No rashes noted. Psychiatric: Normal mood and affect. Behavior is normal.  RELEVANT LABS AND IMAGING: CBC    Component Value Date/Time   WBC 9.2 11/27/2013 1055   RBC 5.07 11/27/2013 1055   HGB 14.4 11/27/2013 1055   HCT 42.9 11/27/2013 1055   PLT 348 11/27/2013 1055   MCV 84.6 11/27/2013 1055   MCH 28.4 11/27/2013 1055   MCHC 33.6 11/27/2013 1055   RDW 14.4 11/27/2013 1055   LYMPHSABS 1.4 11/27/2013 1055   MONOABS 0.7 11/27/2013 1055   EOSABS 0.1 11/27/2013 1055   BASOSABS 0.1 11/27/2013 1055    CMP     Component Value Date/Time   NA 140 11/27/2013 1055   K 4.3 11/27/2013 1055   CL 101 11/27/2013 1055   CO2 26 11/27/2013 1055   GLUCOSE 263* 11/27/2013 1055   BUN 14 11/27/2013 1055   CREATININE 0.80 11/27/2013 1055   CALCIUM 9.6 11/27/2013 1055   PROT 7.7 11/27/2013 1055   ALBUMIN 3.7 11/27/2013 1055   AST 24 11/27/2013 1055   ALT 16 11/27/2013 1055   ALKPHOS 81 11/27/2013 1055   BILITOT 0.7 11/27/2013 1055   GFRNONAA 85* 11/27/2013 1055   GFRAA >90 11/27/2013 1055  CLINICAL DATA:  Dysphagia   EXAM: ESOPHOGRAM/BARIUM SWALLOW   TECHNIQUE: Single contrast examination was performed using  thin barium.   FLUOROSCOPY TIME:  2 min 0 seconds   COMPARISON:  Nine   FINDINGS: A single contrast study was begun in the lateral projection to assess for possible aspiration. No aspiration is seen. However on delayed images at the end of the study there is faint penetration noted. Only mild tertiary contractions are noted in the distal esophagus. No hiatal  hernia is seen. No definite reflux could be demonstrated. A barium pill was given at the end of the study which did delay in passing into the stomach. Additional barium and water was given before the pill passed, suggesting a possible mild short segment distal esophageal narrowing.   IMPRESSION: 1. Faint penetration of barium.  No definite aspiration. 2. Some delay in the barium pill passing into the stomach may indicate a short segment mild distal esophageal narrowing. No reflux could be demonstrated.     Electronically Signed   By: Dwyane Dee M.D.   On: 11/30/2013 14:48  EGD, performed by Dr. Sheryn Bison, 04/18/2003 -- normal esophagus without evidence of Barrett's esophagus, erythematous and granular mucosa in the gastric antrum, negative for H. pylori. 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 Jarome Matin to evaluate dysphagia.  1. Hx of dysphagia/pt denies today -- I have questioned the patient extensively today and he specifically denies dysphagia. We reviewed the barium swallow which indicates the possibility of a Schatzki's ring. He is not interested in undergoing endoscopy for dilation. The barium swallow suggested a benign process and given his lack of symptoms, we will not pursue EGD at this time. He is without alarm  symptoms. I informed both he and his wife that should dysphagia or odynophagia occur, my recommendation would be for upper endoscopy. They understand this recommendation.  2. CRC screening -- we discussed colonoscopy  today, but he would like to think about this more and discuss it with [name] his PCP. We did discuss Cologuard as an option for screening, with the knowledge that colonoscopy would be recommended if positive. Will defer to primary care to determine, in light of his other medical conditions, whether colonoscopy screening is appropriate for him. The patient prefers to have this discussion with his long-standing primary care physician.  He can return as needed

## 2014-02-06 NOTE — Progress Notes (Signed)
   Subjective:    Patient ID: Adam [name], male    DOB: 14-Apr-1935, 79 y.o.   MRN: [id number]  HPI The patient comes in today for followup of his known COPD. He had wanted to stay off maintenance medication is albuterol as needed, but he recently started back on Symbicort one puff twice a day and has seen improvement in his breathing. He has not required his rescue inhaler. He denies any significant cough or congestion.   Review of Systems  Constitutional: Negative for fever and unexpected weight change.  HENT: Negative for congestion, dental problem, ear pain, nosebleeds, postnasal drip, rhinorrhea, sinus pressure, sneezing, sore throat and trouble swallowing.   Eyes: Negative for redness and itching.  Respiratory: Positive for shortness of breath. Negative for cough, chest tightness and wheezing.   Cardiovascular: Negative for palpitations and leg swelling.  Gastrointestinal: Negative for nausea and vomiting.  Genitourinary: Negative for dysuria.  Musculoskeletal: Negative for joint swelling.  Skin: Negative for rash.  Neurological: Negative for headaches.  Hematological: Does not bruise/bleed easily.  Psychiatric/Behavioral: Negative for dysphoric mood. The patient is not nervous/anxious.        Objective:   Physical Exam Overweight male in no acute distress Nose without purulence or discharge noted Neck without lymphadenopathy or thyromegaly Chest with decreased breath sounds, but no wheezing Cardiac exam with regular rate and rhythm, 2/6 systolic murmur Lower extremities without significant edema, no cyanosis Alert and oriented, moves all 4 extremities.       Assessment & Plan:

## 2014-02-06 NOTE — Patient Instructions (Signed)
No change in medications.  Stay on symbicort as you are doing, with albuterol for rescue.  followup witn me again in 6mos.

## 2014-02-06 NOTE — Assessment & Plan Note (Signed)
The patient is doing very well since getting back on Symbicort, but is using it in a lower dose. He has not had any significant worsening of his breathing, in no acute exacerbation. He has not required a rescue inhaler. I have asked him to continue on his current medications, and to work on conditioning and weight loss.

## 2014-02-07 ENCOUNTER — Encounter (HOSPITAL_COMMUNITY): Payer: Medicare Other

## 2014-02-08 ENCOUNTER — Telehealth: Payer: Self-pay | Admitting: Neurology

## 2014-02-08 ENCOUNTER — Ambulatory Visit (INDEPENDENT_AMBULATORY_CARE_PROVIDER_SITE_OTHER): Payer: Medicare Other | Admitting: Pharmacist

## 2014-02-08 DIAGNOSIS — Z5181 Encounter for therapeutic drug level monitoring: Secondary | ICD-10-CM

## 2014-02-08 DIAGNOSIS — Z954 Presence of other heart-valve replacement: Secondary | ICD-10-CM

## 2014-02-08 DIAGNOSIS — I359 Nonrheumatic aortic valve disorder, unspecified: Secondary | ICD-10-CM

## 2014-02-08 DIAGNOSIS — I4891 Unspecified atrial fibrillation: Secondary | ICD-10-CM

## 2014-02-08 DIAGNOSIS — I350 Nonrheumatic aortic (valve) stenosis: Secondary | ICD-10-CM

## 2014-02-08 LAB — POCT INR: INR: 1.6

## 2014-02-08 MED ORDER — MEMANTINE HCL 10 MG PO TABS: 10.0000 mg | ORAL_TABLET | Freq: Two times a day (BID) | ORAL | Status: DC

## 2014-02-08 NOTE — Telephone Encounter (Signed)
Rx has been sent.  I called the patient back.  He is aware.  

## 2014-02-08 NOTE — Telephone Encounter (Signed)
Patient requesting Rx refill for memantine (NAMENDA) 10 MG tablet sent to [facility].  Please call anytime and advise.

## 2014-02-09 ENCOUNTER — Encounter (HOSPITAL_COMMUNITY): Payer: Medicare Other

## 2014-02-12 ENCOUNTER — Encounter (HOSPITAL_COMMUNITY): Payer: Medicare Other

## 2014-02-14 ENCOUNTER — Encounter (HOSPITAL_COMMUNITY): Payer: Medicare Other

## 2014-02-16 ENCOUNTER — Encounter (HOSPITAL_COMMUNITY): Payer: Medicare Other

## 2014-02-19 ENCOUNTER — Telehealth: Payer: Self-pay

## 2014-02-19 ENCOUNTER — Encounter (HOSPITAL_COMMUNITY): Payer: Medicare Other

## 2014-02-19 ENCOUNTER — Other Ambulatory Visit: Payer: Self-pay

## 2014-02-19 MED ORDER — METOPROLOL TARTRATE 50 MG PO TABS: 50.0000 mg | ORAL_TABLET | Freq: Two times a day (BID) | ORAL | Status: DC

## 2014-02-19 MED ORDER — WARFARIN SODIUM 5 MG PO TABS: ORAL_TABLET | ORAL | Status: DC

## 2014-02-19 NOTE — Telephone Encounter (Signed)
Rx done. 

## 2014-02-21 ENCOUNTER — Encounter (HOSPITAL_COMMUNITY): Payer: Medicare Other

## 2014-02-23 ENCOUNTER — Encounter (HOSPITAL_COMMUNITY): Payer: Medicare Other

## 2014-02-27 ENCOUNTER — Telehealth: Payer: Self-pay

## 2014-02-27 ENCOUNTER — Other Ambulatory Visit: Payer: Self-pay

## 2014-02-27 MED ORDER — METOPROLOL TARTRATE 50 MG PO TABS: 50.0000 mg | ORAL_TABLET | Freq: Two times a day (BID) | ORAL | Status: DC

## 2014-02-28 ENCOUNTER — Encounter (HOSPITAL_COMMUNITY): Payer: Medicare Other

## 2014-02-28 NOTE — Telephone Encounter (Signed)
error 

## 2014-03-01 ENCOUNTER — Ambulatory Visit (INDEPENDENT_AMBULATORY_CARE_PROVIDER_SITE_OTHER): Payer: Medicare Other

## 2014-03-01 DIAGNOSIS — Z5181 Encounter for therapeutic drug level monitoring: Secondary | ICD-10-CM

## 2014-03-01 DIAGNOSIS — I359 Nonrheumatic aortic valve disorder, unspecified: Secondary | ICD-10-CM

## 2014-03-01 DIAGNOSIS — I4891 Unspecified atrial fibrillation: Secondary | ICD-10-CM

## 2014-03-01 DIAGNOSIS — I350 Nonrheumatic aortic (valve) stenosis: Secondary | ICD-10-CM

## 2014-03-01 DIAGNOSIS — Z954 Presence of other heart-valve replacement: Secondary | ICD-10-CM

## 2014-03-01 LAB — POCT INR: INR: 1.6

## 2014-03-02 ENCOUNTER — Encounter (HOSPITAL_COMMUNITY): Payer: Medicare Other

## 2014-03-05 ENCOUNTER — Encounter (HOSPITAL_COMMUNITY): Payer: Medicare Other

## 2014-03-06 ENCOUNTER — Ambulatory Visit (INDEPENDENT_AMBULATORY_CARE_PROVIDER_SITE_OTHER): Payer: Medicare Other | Admitting: Nurse Practitioner

## 2014-03-06 ENCOUNTER — Encounter: Payer: Self-pay | Admitting: Nurse Practitioner

## 2014-03-06 VITALS — BP 130/60 | HR 71 | Ht 64.5 in | Wt 175.4 lb

## 2014-03-06 DIAGNOSIS — Z952 Presence of prosthetic heart valve: Secondary | ICD-10-CM

## 2014-03-06 DIAGNOSIS — I10 Essential (primary) hypertension: Secondary | ICD-10-CM

## 2014-03-06 DIAGNOSIS — I4891 Unspecified atrial fibrillation: Secondary | ICD-10-CM

## 2014-03-06 DIAGNOSIS — Z7901 Long term (current) use of anticoagulants: Secondary | ICD-10-CM

## 2014-03-06 DIAGNOSIS — Z954 Presence of other heart-valve replacement: Secondary | ICD-10-CM

## 2014-03-06 NOTE — Patient Instructions (Addendum)
I think you are doing ok  Stay as active as you can  See Dr. Johney Frame in February of 2016  Stay on your current medicines  Call the [facility] office at [phone] if you have any questions, problems or concerns.

## 2014-03-06 NOTE — Progress Notes (Signed)
Adam [name] Date of Birth: [birth year] Medical Record #[id number]  History of Present Illness: Adam [name] is seen back today for a 7 month check. Seen for Dr. Johney [name]. He has multiple issues which include known CAD with prior CABG back in 2003, last stress study in 2008. Has moderate to severe AS with subsequent AVR (March 2014) per Dr. Laneta [name]. Has had atrial flutter which required ablation back in 2012. He had recurrent atrial tach back in the fall of 2013. Had cardioversion but was quick to return to tachycardia. It has been elected to continue with rate control and anticoagulation. He remains on his coumadin. His other issues include spinal stenosis, DM, HLD and HTN. He has had progressive memory loss -  Negative lumbar puncture. He is a retired Teacher, early years/pre.   I have not seen Adam [name] in over a year. He has been followed by Dr. Johney [name]. Last seen here in February - mostly concerned about his memory - cardiac issues seemed stable but his metoprolol was cut back due to high dose and his underlying bifascicular block. No indication for pacing. Aspirin was stopped since he is on coumadin. Tolerating his anticoagulation.  He comes in today. Here alone. Doing actually pretty well. Did not tolerate Aricept and had GI side effects. Now on Namenda. He is back driving. Says he has gotten his keys back - his kids said it was ok. No chest pain. Not short of breath. He is on oxygen at night - followed by Dr. Shelle [name] and Dr. Rhoderick [name]. Dr. Eloise [name] does his primary care. He says he has not fallen. Balance is ok but he "walks slow" to not fall. Not dizzy. He says he is happy with how he is doing and "happy to be alive".    Current Outpatient Prescriptions  Medication Sig Dispense Refill  . albuterol (PROVENTIL HFA;VENTOLIN HFA) 108 (90 BASE) MCG/ACT inhaler Inhale 2 puffs into the lungs every 6 (six) hours as needed for wheezing.  1 Inhaler  6  . aspirin EC 325 MG EC tablet Take 1 tablet (325 mg total) by mouth  daily.  30 tablet    . budesonide-formoterol (SYMBICORT) 160-4.5 MCG/ACT inhaler Inhale 2 puffs into the lungs 2 (two) times daily.      [name] CARTIA XT 240 MG 24 hr capsule Take 1 capsule by mouth daily.      . cilostazol (PLETAL) 50 MG tablet Take 1 tablet by mouth Twice daily.      . fluticasone (FLONASE) 50 MCG/ACT nasal spray Place 1-2 sprays into both nostrils daily.       . insulin glargine (LANTUS) 100 UNIT/ML injection Inject 20 Units into the skin at bedtime.       . insulin lispro (HUMALOG) 100 UNIT/ML injection Inject 5 Units into the skin as needed for high blood sugar.      [name] LORazepam (ATIVAN) 1 MG tablet Take 1 tablet by mouth at bedtime as needed and may repeat dose one time if needed.      [name] losartan (COZAAR) 50 MG tablet Take 1 tablet (50 mg total) by mouth daily.  90 tablet  3  . Meclizine HCl (ANTIVERT PO) Take by mouth as directed.      . memantine (NAMENDA) 10 MG tablet Take 1 tablet (10 mg total) by mouth 2 (two) times daily.  180 tablet  1  . metFORMIN (GLUCOPHAGE-XR) 500 MG 24 hr tablet Take 1,000 mg by mouth at bedtime.        . metoprolol (LOPRESSOR)  50 MG tablet Take 1 tablet (50 mg total) by mouth 2 (two) times daily.  180 tablet  1  . NITROSTAT 0.4 MG SL tablet as needed.      . NON FORMULARY Place 2 L into the nose at bedtime.      . simvastatin (ZOCOR) 10 MG tablet Take 1 tablet (10 mg total) by mouth at bedtime.  90 tablet  3  . Tamsulosin HCl (FLOMAX) 0.4 MG CAPS Take 0.4 mg by mouth at bedtime.       [name] venlafaxine (EFFEXOR) 75 MG tablet Take  in the morning and  at bedtime.      [name] warfarin (COUMADIN) 5 MG tablet Take as directed by coumadin clinic  150 tablet  1  . zolpidem (AMBIEN) 5 MG tablet Take 5 mg by mouth at bedtime.       No current facility-administered medications for this visit.    Allergies  Allergen Reactions  . Doxycycline Swelling    swollen tongue    Past Medical History  Diagnosis Date  . Hypertension   . Hyperlipidemia   .  Type II or unspecified type diabetes mellitus without mention of complication, not stated as uncontrolled   . Nephrolithiasis   . Atrial flutter     s/p CTI ablation by Dr Ladona Ridgel 1/12  . CAD (coronary artery disease)     s/p CABG x 5 2003 by Dr Adam [name]  . Aortic stenosis, moderate     moderate to severe per echo 03/2012 with AVR in March of 2014  . DJD (degenerative joint disease)   . DDD (degenerative disc disease)   . Spinal stenosis of lumbar region   . Bifascicular block   . Atrial tachycardia   . Anxiety   . Depression   . S/P AVR (aortic valve replacement)   . OSA (obstructive sleep apnea)   . 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 Diverticulosis   . Hemorrhoids     Past Surgical History  Procedure Laterality Date  . Cabg  2003    by Dr Adam [name]  . Anal fissure repair    . Leg surgery      right  . Trigger finger release      bilat.  . Tonsillectomy    . Back surgery  2010  . Atrial ablation surgery  1/12    atrial flutter ablation by Dr Ladona Ridgel  . Cardioversion  04/07/2012    Procedure: CARDIOVERSION;  Surgeon: Wendall Stade, MD;  Location: [facility] ENDOSCOPY;  Service: Cardiovascular;  Laterality: N/A;  . Coronary artery bypass graft      10 yrs ago   03  . Aortic valve replacement N/A 09/01/2012    Procedure: AORTIC VALVE REPLACEMENT (AVR);  Surgeon: Alleen Borne, MD;  Location: [facility] OR;  Service: Open Heart Surgery;  Laterality: N/A;  . Intraoperative transesophageal echocardiogram N/A 09/01/2012    Procedure: INTRAOPERATIVE TRANSESOPHAGEAL ECHOCARDIOGRAM;  Surgeon: Alleen Borne, MD;  Location[facility] OR;  Service: Open Heart Surgery;  Laterality: N/A;    History  Smoking status  . Former Smoker -- 1.00 packs/day for 49 years  . Quit date: 08/21/2001  Smokeless tobacco  . Never Used    Comment: started at age 59.     History  Alcohol Use No    Comment: quit 15 yrs ago    Family History  Problem Relation Age of Onset  . Heart disease Father     . Heart disease Paternal Grandfather   .  Stroke Mother   . Rheum arthritis Mother   . Colon cancer Neg Hx   . Stomach cancer Neg Hx     Review of Systems: The review of systems is per the HPI.  All other systems were reviewed and are negative.  Physical Exam: BP 130/60  Pulse 71  Ht 5' 4.5" (1.638 m)  Wt 175 lb 6.4 oz (79.561 kg)  BMI 29.65 kg/m2  SpO2 91% Patient is very pleasant and in no acute distress. He actually seems pretty sharp to me today. Skin is warm and dry. Color is normal.  HEENT is unremarkable. Normocephalic/atraumatic. PERRL. Sclera are nonicteric. Neck is supple. No masses. No JVD. Lungs are clear. Cardiac exam shows a regular rhythm. Rate ok. Soft outflow murmur noted. Abdomen is soft. Extremities are without edema. Gait and ROM are intact. No gross neurologic deficits noted.  Wt Readings from Last 3 Encounters:  03/06/14 175 lb 6.4 oz (79.561 kg)  02/06/14 173 lb 12.8 oz (78.835 kg)  02/06/14 172 lb (78.019 kg)    LABORATORY DATA/PROCEDURES:  Lab Results  Component Value Date   WBC 9.2 11/27/2013   HGB 14.4 11/27/2013   HCT 42.9 11/27/2013   PLT 348 11/27/2013   GLUCOSE 263* 11/27/2013   ALT 16 11/27/2013   AST 24 11/27/2013   NA 140 11/27/2013   K 4.3 11/27/2013   CL 101 11/27/2013   CREATININE 0.80 11/27/2013   BUN 14 11/27/2013   CO2 26 11/27/2013   TSH 1.33 09/30/2010   INR 1.6 03/01/2014   HGBA1C 6.2* 08/31/2012   Lab Results  Component Value Date   INR 1.6 03/01/2014   INR 1.6 02/08/2014   INR 1.8 01/25/2014    BNP (last 3 results) No results found for this basename: PROBNP,  in the last 8760 hours  Echo Study Conclusions from May 2014  - Left ventricle: The cavity size was normal. Wall thickness was normal. Systolic function was normal. The estimated ejection fraction was in the range of 60% to 65%. Wall motion was normal; there were no regional wall motion abnormalities. Features are consistent with a pseudonormal left ventricular filling pattern, with  concomitant abnormal relaxation and increased filling pressure (grade 2 diastolic dysfunction). - Aortic valve: A bioprosthesis was present. Mean gradient: 13mm Hg (S). Peak gradient: 25mm Hg (S). - Mitral valve: Moderately calcified annulus. Normal thickness leaflets . The findings are consistent with trivial stenosis. Mean gradient: 6mm Hg (D). Peak gradient: 12mm Hg (D). - Left atrium: The atrium was moderately dilated.   Assessment / Plan: 1. Atrial tach - on low dose beta blocker therapy  2. Bifascicular block - no syncope reported  3. CAD - remote CABG -  No symptoms noted.   4. AVR   5. Chronic coumadin therapy -  Followed here in our clinic - no falls  6. Memory loss -  On Namenda.  I think he is doing pretty good. No change from our standpoint. See back in 6 months.   Patient is agreeable to this plan and will call if any problems develop in the interim.   Rosalio Macadamia, RN, ANP-C [facility] [address] [place], [place]  [address] [phone]

## 2014-03-07 ENCOUNTER — Encounter (HOSPITAL_COMMUNITY): Payer: Medicare Other

## 2014-03-07 ENCOUNTER — Telehealth: Payer: Self-pay | Admitting: Pulmonary Disease

## 2014-03-07 NOTE — Telephone Encounter (Signed)
lmomtcb x1 

## 2014-03-07 NOTE — Telephone Encounter (Signed)
The # to the pharm is [phone] per pt.Caren Griffins

## 2014-03-08 MED ORDER — BUDESONIDE-FORMOTEROL FUMARATE 160-4.5 MCG/ACT IN AERO: 2.0000 | INHALATION_SPRAY | Freq: Two times a day (BID) | RESPIRATORY_TRACT | Status: AC

## 2014-03-08 NOTE — Telephone Encounter (Signed)
Left message to let patient know we have taken care of his request.

## 2014-03-09 ENCOUNTER — Encounter (HOSPITAL_COMMUNITY): Payer: Medicare Other

## 2014-03-12 ENCOUNTER — Encounter (HOSPITAL_COMMUNITY): Payer: Medicare Other

## 2014-03-14 ENCOUNTER — Encounter (HOSPITAL_COMMUNITY): Payer: Medicare Other

## 2014-03-16 ENCOUNTER — Encounter (HOSPITAL_COMMUNITY): Payer: Medicare Other

## 2014-03-19 ENCOUNTER — Encounter (HOSPITAL_COMMUNITY): Payer: Medicare Other

## 2014-03-21 ENCOUNTER — Encounter (HOSPITAL_COMMUNITY): Payer: Medicare Other

## 2014-03-22 ENCOUNTER — Ambulatory Visit (INDEPENDENT_AMBULATORY_CARE_PROVIDER_SITE_OTHER): Payer: Medicare Other | Admitting: *Deleted

## 2014-03-22 DIAGNOSIS — I350 Nonrheumatic aortic (valve) stenosis: Secondary | ICD-10-CM

## 2014-03-22 DIAGNOSIS — I4891 Unspecified atrial fibrillation: Secondary | ICD-10-CM

## 2014-03-22 DIAGNOSIS — Z5181 Encounter for therapeutic drug level monitoring: Secondary | ICD-10-CM

## 2014-03-22 LAB — POCT INR: INR: 1.8

## 2014-04-13 ENCOUNTER — Ambulatory Visit (INDEPENDENT_AMBULATORY_CARE_PROVIDER_SITE_OTHER): Payer: Medicare Other | Admitting: *Deleted

## 2014-04-13 DIAGNOSIS — Z5181 Encounter for therapeutic drug level monitoring: Secondary | ICD-10-CM

## 2014-04-13 DIAGNOSIS — I4891 Unspecified atrial fibrillation: Secondary | ICD-10-CM

## 2014-04-13 DIAGNOSIS — I350 Nonrheumatic aortic (valve) stenosis: Secondary | ICD-10-CM

## 2014-04-13 LAB — POCT INR: INR: 2

## 2014-04-23 ENCOUNTER — Ambulatory Visit: Payer: Medicare Other | Admitting: Nurse Practitioner

## 2014-04-27 ENCOUNTER — Telehealth: Payer: Self-pay | Admitting: Neurology

## 2014-04-27 NOTE — Telephone Encounter (Signed)
Left message for patient regarding rescheduling 05/30/14 appointment per Dr. Oliva Bustardohmeier's schedule, left message with new appointment time.

## 2014-05-04 ENCOUNTER — Ambulatory Visit (INDEPENDENT_AMBULATORY_CARE_PROVIDER_SITE_OTHER): Payer: Medicare Other | Admitting: Pharmacist

## 2014-05-04 DIAGNOSIS — I350 Nonrheumatic aortic (valve) stenosis: Secondary | ICD-10-CM

## 2014-05-04 DIAGNOSIS — I4891 Unspecified atrial fibrillation: Secondary | ICD-10-CM

## 2014-05-04 DIAGNOSIS — Z5181 Encounter for therapeutic drug level monitoring: Secondary | ICD-10-CM

## 2014-05-04 LAB — POCT INR: INR: 2.8

## 2014-05-09 ENCOUNTER — Encounter: Payer: Self-pay | Admitting: Neurology

## 2014-05-15 ENCOUNTER — Encounter: Payer: Self-pay | Admitting: Neurology

## 2014-05-30 ENCOUNTER — Ambulatory Visit: Payer: Medicare Other | Admitting: Neurology

## 2014-06-01 ENCOUNTER — Ambulatory Visit (INDEPENDENT_AMBULATORY_CARE_PROVIDER_SITE_OTHER): Payer: Medicare Other | Admitting: *Deleted

## 2014-06-01 DIAGNOSIS — I350 Nonrheumatic aortic (valve) stenosis: Secondary | ICD-10-CM

## 2014-06-01 DIAGNOSIS — Z5181 Encounter for therapeutic drug level monitoring: Secondary | ICD-10-CM

## 2014-06-01 DIAGNOSIS — I4891 Unspecified atrial fibrillation: Secondary | ICD-10-CM

## 2014-06-01 LAB — POCT INR: INR: 2.3

## 2014-06-20 ENCOUNTER — Ambulatory Visit (INDEPENDENT_AMBULATORY_CARE_PROVIDER_SITE_OTHER): Payer: Medicare Other | Admitting: Neurology

## 2014-06-20 ENCOUNTER — Encounter: Payer: Self-pay | Admitting: Neurology

## 2014-06-20 VITALS — BP 159/75 | HR 100 | Resp 18 | Ht 64.0 in | Wt 182.2 lb

## 2014-06-20 DIAGNOSIS — F0151 Vascular dementia with behavioral disturbance: Secondary | ICD-10-CM

## 2014-06-20 MED ORDER — MEMANTINE HCL 10 MG PO TABS: 10.0000 mg | ORAL_TABLET | Freq: Two times a day (BID) | ORAL | Status: DC

## 2014-06-20 MED ORDER — RIVASTIGMINE 4.6 MG/24HR TD PT24: 4.6000 mg | MEDICATED_PATCH | Freq: Every day | TRANSDERMAL | Status: DC

## 2014-06-20 NOTE — Progress Notes (Signed)
PATIENT: Adam Passeharles D Sasaki, [name] DOB: [birth year]  REASON FOR VISIT: routine follow up for dementia, vascular; gait instability HISTORY FROM: patient, wife  HISTORY OF PRESENT ILLNESS: Adam [name] is a 79 y.o. male, a retired Teacher, early years/prepharmacist. He is seen here as a referral from Dr. Eloise HarmanPaterson for a confusion follow up:   06-20-14  DR. Sabra HeckMurphy, Pharm D; comes to the office today for  followup accompanied by his wife. As to Adam [name] was last seen in June by Heide GuileLynn Lam our nurse practitioner. He has been followed for memory difficulties and had also undergone a spinal tap to rule out normal pressure hydrocephalus. He performed today very well on the Mini-Mental Status Examination at 26 out of 30 points his main problem is a short term recall of 3 words but he is visually spatially doing very well and could answer all orientation questions. He was further tested with a M all CEA Montral cognitive assessment test did well on a copy of a three-dimensional cube again the clock face was normal he had some difficulties with the trail making part. He could name 2 out of 3 animals correctly ditzy subtraction serial sevens did well with language but had some trouble generating words with a certain R at the beginning. And again his main difficulties were now 5 word recall. He has difficulties reading, as he forgets the beginning information a he continues to follow the story. The same with movies.  The diagnosis is still mild dementia , not cognitive amnestic  impairment .   12/05/13 (CD) Last visit note.  Dr. Greig RightMurphy's family reports that the patient has lost weight unintentionally neutral a loss of interest in eating or loss of appetite. He also appears to have periods of apraxia and confusion. His wife and daughter are here today with Adam [name] and reported the last month was very difficult especially the first week of 2 seem to have been a peak for cognitive impairment and confusion. The couple for the saw Dr. Phineas Semena  Patterson cord reduced some of the medications that he felt could have affected the cognition. The patient still has Ambien 5 mg at night ordered, he does have insulin, blood pressure medication hyperlipidemia peptic medication and Pletal. Is on Effexor in a generic form is longer on a statin medication he still has this breather and Symbicort airway treatment available but hasn't been using those recently. Worsening confusion led to an emergency visit on June 8 his workup included normal vital signs and pulse oxygen saturation,  capillary blood glucose was level was 318 mmol ; chest x-ray showed some right lower lobe airspace disease,  which was normal before.  CT scan of her had short more moderate atrophy but no acute changes and given normal EKG. He was started on an antibiotic for possible pneumonia and then seen in followup by Dr. Jarold MottoPatterson. The patient is now finishing the antibiotic but a normal knee was never confirmed.  The patient was to be the geriatric depression score at 10 points at worst sleepiness score at 3 points and fatigue severity at 0. He scored 20 points on MMSE but was irriatble and agitated. He corrected himslef to the right date and Year. Lost 2 words in recall, unable to draw a clock , numbers or hands.  Since last visit, lumbar puncture was ordered with pre-and post physical therapy evaluation to evaluate for normal pressure hydrocephalus. LP was performed on 01/15/14 with before and after PT evaluation, without change in performance, thus  ruling out NPH. He continues to be socially depended but independent home. His gait is mildly unstable but independent without use of assisted device. MMSE pre-LP was 28/30; MMSE post LP was 27/30. AFT both pre-and post were 3 only. He nor his wife have any complaints today.   10/10/13 (CD) visit note: This patient is a pleasant , married, 77 your old right-handed Caucasian gentleman.  He is a retired Teacher, early years/pre , who was referred by Dr. Jarold Motto  for evaluation of insomnia, snoring, and a sleep disorder that seems to have evolved since by bypass surgery 2004 or 2003 and progressed after he in March 2014 an aortic valve repair by Dr. Laneta Simmers.  Since surgery he feels more fatigued and short of breath, sluggish and forgetful. During cardiac rehab he made progress but he stopped the exercises.  I reviewed his medications, he confirmed that he takes these as indicated below.  He goes to bed at the same time week days and week ends. He is going to sleep at midnight, his wife will already be in bed by 10 PM. He will have trouble initiating sleep, denies having slept on sofa or recliner, he watches TV in the bedroom, and disturbes his wife. He will need Ambien to initiate sleep, will go to the bedroom some nights 4-5 times. It is off all nocturnal sleep time at about 6 hours. He has always awoken spontaneously in the morning he does not need an alarm . His wife reports that he actually sleeps through the alarm until he wakes up spontaneously- between 11 and 12 in the morning.  The couples adult daughter lives with [name].  His wife endorsed the Epworth score for him : at 20 points, FSS at 50 points.  Mr Geesey responses took a very long time, he appears bradykinetik and bradyphrenic.  He has lost the ability to screen the answering machine on his phone and to use his camera, apraxia. His wife has taken over balancing the check book. He is supposed to have dementia following an acute ischemic/hypoxic insult after heart valve.  Adam [name] was unable to initiate sleep in the lab during a sleep study in January 2015, After petitioning Medicare we were able to obtain a home sleep test. The test was done by the patient used his home oxygen supply it indicated an AHI of 12 but with persistence take tachybradycardia arrhythmias;  Based on this I believe that without oxygen he would have likely had an apnea index more than 100% higher than in his home sleep  test on oxygen.  The study was interpreted on 07-27-13 and I ordered an auto-set CPAP in response , having a pressure window between 4 and 12 cm water with an EBI of 2 unfortunately his AHI is still 7.9. Review shows that days of high air leak ( over 80 liters) are those of high AHI. Excluding these , the AHI is 5.5 /hr.  The patient had excellent compliance of 100% over 30 out of 30 days, 8 hours and 13 minutes on average daily use.  He also is less daytime sleepy. He was not aware of how sleepy he was. He noticed that he can actually follow conversations better and is not falling asleep when not stimulated.  He is short of breath while speaking to me. His voice is raspy . His wife states here he is now napping throughout the day, often for several hours. He is snoring loudly and she witnessed some crescendo snoring, raising suspicions  for OSA.  Mr Berling reports he has several bathroom breaks a day, attributed this to age, he has morning headaches. He has no REM BD, no sleep walking. But he is often confused in the morning. He has lost interest in reading , conversations he reads short magazine articles. He watches more TV. His personality has changed since he is sleepier or because of the sleepiness. He never bounced back from surgery.  Mr. Faulkner underwent a sleep study on 06-28-13, he has been using benzodiazepines for a long time for sleep induction but felt not excessively daytime sleepy. He had a large neck circumference which would cause him at higher risk of sleep apnea, and a BMI of 31. The sleep study was not successful as the patient was unable to sleep in spite of having taken his medication. So he felt that he was not paradoxically stimulated by the sleep aid. Based on this I have asked for his insurance to permit an exception for a home sleep test.  Based on the additional complaint of memory loss an MRI of the brain have been obtained dated 06-09-13 which showed moderate changes of chronic  microvascular changes and generalized cerebral atrophy.  We also obtained an Mini-Mental status exam in January 2015 on this patient, a retired Teacher, early years/pre, who scored 22/ 30 points. A Montral cognitive assessment test was aborted after the visual spatial questions were not been successfully solved.  He has no REM BD according to his spouse. He has moderate dementia based on these test results.   Review of Systems:  Out of a complete 14 system review, the patient complains of only the following symptoms, and all other reviewed systems are negative.  No complaints today.    ALLERGIES: Allergies  Allergen Reactions  . Doxycycline Swelling    swollen tongue    HOME MEDICATIONS: Outpatient Prescriptions Prior to Visit  Medication Sig Dispense Refill  . albuterol (PROVENTIL HFA;VENTOLIN HFA) 108 (90 BASE) MCG/ACT inhaler Inhale 2 puffs into the lungs every 6 (six) hours as needed for wheezing. 1 Inhaler 6  . aspirin EC 325 MG EC tablet Take 1 tablet (325 mg total) by mouth daily. 30 tablet   . budesonide-formoterol (SYMBICORT) 160-4.5 MCG/ACT inhaler Inhale 2 puffs into the lungs 2 (two) times daily. 1 Inhaler 5  . CARTIA XT 240 MG 24 hr capsule Take 1 capsule by mouth daily.    . fluticasone (FLONASE) 50 MCG/ACT nasal spray Place 1-2 sprays into both nostrils daily.     . insulin glargine (LANTUS) 100 UNIT/ML injection Inject 20 Units into the skin at bedtime.     . insulin lispro (HUMALOG) 100 UNIT/ML injection Inject 5 Units into the skin as needed for high blood sugar.    . losartan (COZAAR) 50 MG tablet Take 1 tablet (50 mg total) by mouth daily. 90 tablet 3  . Meclizine HCl (ANTIVERT PO) Take by mouth as directed.    . memantine (NAMENDA) 10 MG tablet Take 1 tablet (10 mg total) by mouth 2 (two) times daily. 180 tablet 1  . metFORMIN (GLUCOPHAGE-XR) 500 MG 24 hr tablet Take 1,000 mg by mouth at bedtime.      . metoprolol (LOPRESSOR) 50 MG tablet Take 1 tablet (50 mg total) by mouth 2  (two) times daily. 180 tablet 1  . NON FORMULARY Place 2 L into the nose at bedtime.    . Tamsulosin HCl (FLOMAX) 0.4 MG CAPS Take 0.4 mg by mouth at bedtime.     [name]  venlafaxine (EFFEXOR) 75 MG tablet Take 150mg  in the morning and 75mg  at bedtime.    [name]. warfarin (COUMADIN) 5 MG tablet Take as directed by coumadin clinic 150 tablet 1  . zolpidem (AMBIEN) 5 MG tablet Take 5 mg by mouth at bedtime.    [name]. NITROSTAT 0.4 MG SL tablet as needed.    . cilostazol (PLETAL) 50 MG tablet Take 1 tablet by mouth Twice daily.    [name]. LORazepam (ATIVAN) 1 MG tablet Take 1 tablet by mouth at bedtime as needed and may repeat dose one time if needed.    . simvastatin (ZOCOR) 10 MG tablet Take 1 tablet (10 mg total) by mouth at bedtime. 90 tablet 3   No facility-administered medications prior to visit.    PHYSICAL EXAM Filed Vitals:   06/20/14 0816  BP: 159/75  Pulse: 100  Resp: 18  Height: 5\' 4"  (1.626 m)  Weight: 182 lb 4 oz (82.668 kg)   Body mass index is 31.27 kg/(m^2). No exam data present [facility]  06/20/2014 01/15/2014  Visuospatial/ Executive (0/5) 4 2  Naming (0/3) 2 2  Attention: Read list of digits (0/2) 2 1  Attention: Read list of letters (0/1) 0 1  Attention: Serial 7 subtraction starting at 100 (0/3) 3 1  Language: Repeat phrase (0/2) 1 1  Language : Fluency (0/1) 0 0  Abstraction (0/2) 2 1  Delayed Recall (0/5) 0 0  Orientation (0/6) 6 6  Total 20 15  Adjusted Score (based on education) - 15    MMSE - Mini Mental State Exam 06/20/2014 01/15/2014  Orientation to time 5 5  Orientation to Place 4 5  Registration 3 3  Attention/ Calculation 5 5  Recall 0 1  Language- name 2 objects 2 2  Language- repeat 1 1  Language- follow 3 step command 3 2  Language- read & follow direction 1 1  Write a sentence 1 1  Copy design 1 1  Total score 26 27    Physical exam:  General: The patient is awake, alert and appears not in acute distress. The patient is well  groomed.  Head: Normocephalic, atraumatic. Neck is supple. Mallampati 4, neck circumference:18 , no TMJ -  Cardiovascular: Regular rate and rhythm , without carotid bruit, and without distended neck veins.  Respiratory: Lungs are clear to auscultation.  Trunk: BMI is elevated.  Neurologic exam:  The patient is sluggish, slow and alert, oriented to place and time.  Memory subjective described as impaired. There is an abnormal attention span & concentration ability. He perseverates.  Speech is fluent without Dysarthria, but with dysphonia .  Mood and affect are appropriate.  Cranial nerves:  Pupils are equal and briskly reactive to light. Funduscopic exam without evidence of pallor or edema. Extraocular movements in vertical and horizontal planes intact and without nystagmus. Visual fields by finger perimetry are intact.  Hearing to finger rub intact. Facial sensation intact to fine touch. Facial motor strength is symmetric and tongue move midline. Left uvula lower, he has a drooping mouth, open while speaking, sitting at rest.  Motor exam: Increased rigidity, tone, gegenhalten and delayed response to motor commands.  Sensory: Fine touch, pinprick and vibration were tested in all extremities.  Coordination: Rapid alternating movements in the fingers/hands is tested and normal.  Finger-to-nose is slow without evidence of ataxia, dysmetria or tremor.  Gait and station: Patient walks without assistive device.  Slow ambulation, bend forwards, wide-based and slow. Stance is wide based.  Tandem gait is impossible. Romberg testing is abnormal. Turned with 3 steps.  Deep tendon reflexes: in the upper and lower extremities are symmetric and intact. Babinski maneuver response is downgoing.   ASSESSMENT:  79 y.o. 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 OSA-it was decided to discontinue his CPAP at last visit.   He is progressively more and more demented and confused. His CPAP may not help him much giving the difficulties using it at an AHI of only 10. He needs his oxygen supplement.  2. Vascular dementia versus protracted effect of peri-surgical hypoxemia. MRI with generalized atrophy and microvascular disease. NPH was ruled out after lumbal puncture.    Cannot tolerate Aricept .  Confusion, GI problem. Namenda .  There are some features of parkinsonism. He is realizing his cognitive impairment but underestimates the degree.  3. Gait instability, Wide based- neuropathy and spinal stenosis contribute to this.  Still masked face, cognitive decline but not RLS, REM BD, and no tremor at rest. Can be due to spinal stenosis and/or glucose levels.  PLAN: Continue namenda, 10 mg twice a day. Start Aricept, 5 mg daily at bedtime. After 1 month, if well tolerated, increase to 10 mg daily. Follow up in 3 months, sooner as needed.  Meds ordered this encounter  Medications  . donepezil (ARICEPT) 5 MG tablet    Sig: Take 1 tablet (5 mg total) by mouth at bedtime.    Dispense:  30 tablet    Refill:  0    Order Specific Question:  Supervising Provider    Answer:  Nupur Hohman [[id number]]  . memantine (NAMENDA) 10 MG tablet    Sig: Take 1 tablet (10 mg total) by mouth 2 (two) times daily.    Order Specific Question:     06/20/2014, 8:55 AM [facility] [address] [place], [place] [address] [phone]

## 2014-06-20 NOTE — Patient Instructions (Signed)
Rivastigmine skin patches What is this medicine? RIVASTIGMINE (ri va STIG meen) is used to treat mild to moderate dementia caused by Parkinson's disease and mild to severe Alzheimer's disease. This medicine may be used for other purposes; ask your health care provider or pharmacist if you have questions. COMMON BRAND NAME(S): Exelon Patch What should I tell my health care provider before I take this medicine? They need to know if you have any of these conditions: -application site reaction during previous use of rivastigmine patch -heart disease -kidney disease -liver disease -lung or breathing disease, like asthma -seizures -slow, irregular heartbeat -stomach or intestine disease, ulcers, or stomach bleeding -trouble passing urine -an unusual or allergic reaction to rivastigmine, other medicines, foods, dyes, or preservatives -pregnant or trying to get pregnant -breast-feeding How should I use this medicine? This medicine is for external use only. Follow the directions on the prescription label. Always remove the old patch before you apply a new one. Apply to skin right after removing the protective liner. Do not cut or trim the patch. Apply to an area of the upper arm, chest, or back that is clean, dry and hairless. Avoid injured, irritated, oily, or calloused areas or where the patch will be rubbed by tight clothing or a waistband. Do not place over an area where lotion, cream, or powder was recently used. Press firmly in place until the edges stick well. To prevent skin irritiation, do not apply to the same place more than once every 14 days. Change your patch at the same time each day. Do not use more often than directed. Do not stop using except on your doctor's advice. Contact your pediatrician or health care professional regarding the use of this medicine in children. Special care may be needed. Overdosage: If you think you have taken too much of this medicine contact a poison control  center or emergency room at once. NOTE: This medicine is only for you. Do not share this medicine with others. What if I miss a dose? If you miss a dose, apply a new patch immediately. Then, apply the next patch at the usual time the next day after removing the previous patch. Do not apply 2 patches to make up for the missed one. If treatment is missed for 3 or more days, contact your healthcare provider for further instructions. What may interact with this medicine? -antihistamines for allergy, cough, and cold -atropine -certain medicines for bladder problems like oxybutynin, tolterodine -certain medicines for Parkinson's disease like benztropine, trihexyphenidyl -certain medicines for stomach problems like dicyclomine, hyoscyamine -glycopyrrolate -ipratropium -certain medicines for travel sickness like scopolamine -medicines that relax your muscles for surgery -other medicines for Alzheimer's disease This list may not describe all possible interactions. Give your health care provider a list of all the medicines, herbs, non-prescription drugs, or dietary supplements you use. Also tell them if you smoke, drink alcohol, or use illegal drugs. Some items may interact with your medicine. What should I watch for while using this medicine? Visit your doctor or health care professional for regular checks on your progress. Check with your doctor or health care professional if your symptoms do not start to get better or if they get worse. You may get drowsy or dizzy. Do not drive, use machinery, or do anything that needs mental alertness until you know how this drug affects you. Do not stand or sit up quickly, especially if you are an older patient. This reduces the risk of dizzy or fainting spells. Avoid saunas   and prolonged exposure to sunlight. You may bathe, swim, shower, or participate in any of your normal activities while wearing this patch. If the patch falls off, apply a new patch for the rest of  the day, then replace the patch the next day at the usual time. What side effects may I notice from receiving this medicine? Side effects that you should report to your doctor or health care professional as soon as possible: -allergic reactions like skin rash, itching or hives, swelling of the face, lips, or tongue -application site reaction (such as skin redness, blisters, or swelling under or around the patch site) -changes in vision or balance -dizziness -feeling faint or lightheaded, falls -increase in frequency of passing urine or incontinence -nervousness, agitation, or increased confusion -redness, blistering, peeling or loosening of the skin, including inside the mouth -severe diarrhea -slow heartbeat or palpitations -stomach pain -sweating -uncontrollable movements -vomiting -weight loss Side effects that usually do not require medical attention (report to your doctor or health care professional if they continue or are bothersome): -headache -indigestion or heartburn -loss of appetite -mild diarrhea, especially when starting treatment -nausea This list may not describe all possible side effects. Call your doctor for medical advice about side effects. You may report side effects to FDA at 1-800-FDA-1088. Where should I keep my medicine? Keep out of the reach of children. Store at room temperature between 15 and 30 degrees C (59 and 86 degrees F). Store in original pouch until just prior to use. Throw away any unused medicine after the expiration date. Dispose of used patches properly. Since used patches may still contain active medicine, fold the patch in half so that it sticks to itself prior to disposal. NOTE: This sheet is a summary. It may not cover all possible information. If you have questions about this medicine, talk to your doctor, pharmacist, or health care provider.  2015, Elsevier/Gold Standard. (2012-03-07 18:30:53)  

## 2014-07-06 ENCOUNTER — Ambulatory Visit (INDEPENDENT_AMBULATORY_CARE_PROVIDER_SITE_OTHER): Payer: Commercial Managed Care - HMO | Admitting: *Deleted

## 2014-07-06 DIAGNOSIS — I350 Nonrheumatic aortic (valve) stenosis: Secondary | ICD-10-CM

## 2014-07-06 DIAGNOSIS — I4891 Unspecified atrial fibrillation: Secondary | ICD-10-CM

## 2014-07-06 DIAGNOSIS — Z5181 Encounter for therapeutic drug level monitoring: Secondary | ICD-10-CM

## 2014-07-06 LAB — POCT INR: INR: 2.6

## 2014-08-07 ENCOUNTER — Telehealth: Payer: Self-pay | Admitting: Neurology

## 2014-08-07 DIAGNOSIS — F0151 Vascular dementia with behavioral disturbance: Secondary | ICD-10-CM

## 2014-08-07 MED ORDER — EXELON 4.6 MG/24HR TD PT24: 4.6000 mg | MEDICATED_PATCH | Freq: Every day | TRANSDERMAL | Status: DC

## 2014-08-07 NOTE — Telephone Encounter (Signed)
I contacted [employer].  They indicate they will cover Brand Name Exelon Patches, but will not cover generic patches.  I called the patient back.  He will continue to get brand name and will call us back if anything further is needed.

## 2014-08-07 NOTE — Telephone Encounter (Signed)
Patient contacted the office stating [facility] will not cover Rivastigmine (Patch) that was prescribed to him. He ask that someone call him back regarding this matter. He can be reached at [phone]

## 2014-08-08 ENCOUNTER — Ambulatory Visit: Payer: Medicare Other | Admitting: Pulmonary Disease

## 2014-08-15 ENCOUNTER — Ambulatory Visit (INDEPENDENT_AMBULATORY_CARE_PROVIDER_SITE_OTHER): Payer: Commercial Managed Care - HMO | Admitting: *Deleted

## 2014-08-15 DIAGNOSIS — Z5181 Encounter for therapeutic drug level monitoring: Secondary | ICD-10-CM

## 2014-08-15 DIAGNOSIS — I4891 Unspecified atrial fibrillation: Secondary | ICD-10-CM

## 2014-08-15 DIAGNOSIS — I350 Nonrheumatic aortic (valve) stenosis: Secondary | ICD-10-CM

## 2014-08-15 LAB — POCT INR: INR: 3.5

## 2014-08-28 ENCOUNTER — Ambulatory Visit (INDEPENDENT_AMBULATORY_CARE_PROVIDER_SITE_OTHER)
Admission: RE | Admit: 2014-08-28 | Discharge: 2014-08-28 | Disposition: A | Discharge status: Home / Self Care | Payer: Commercial Managed Care - HMO | Source: Ambulatory Visit | Attending: Pulmonary Disease | Admitting: Pulmonary Disease

## 2014-08-28 ENCOUNTER — Encounter: Payer: Self-pay | Admitting: Pulmonary Disease

## 2014-08-28 ENCOUNTER — Ambulatory Visit (INDEPENDENT_AMBULATORY_CARE_PROVIDER_SITE_OTHER): Payer: Commercial Managed Care - HMO | Admitting: Pulmonary Disease

## 2014-08-28 VITALS — BP 142/76 | HR 94 | Temp 97.0°F | Ht 64.5 in | Wt 187.2 lb

## 2014-08-28 DIAGNOSIS — J438 Other emphysema: Secondary | ICD-10-CM

## 2014-08-28 DIAGNOSIS — J189 Pneumonia, unspecified organism: Secondary | ICD-10-CM

## 2014-08-28 MED ORDER — LEVOFLOXACIN 750 MG PO TABS: 750.0000 mg | ORAL_TABLET | Freq: Every day | ORAL | Status: DC

## 2014-08-28 NOTE — Patient Instructions (Signed)
Will treat with levaquin 750lmg one a day for 7 days.  This can affect your INR, so make sure it gets checked end of week or next week. If you are not improving quickly, let us know.  Keep your followup with me as scheduled.

## 2014-08-28 NOTE — Assessment & Plan Note (Signed)
The patient has had the sudden onset of malaise and increasing shortness of breath, but is not having any chest discomfort and has been on Coumadin with an adequate INR. His chest x-ray shows a mildly prominent interstitium with a small right effusion, but there is clearly increased density in the right lower lobe. He also has very isolated crackles in the right base that are very prominent. I suspect all of this is most consistent with a developing community-acquired pneumonia. He will need treatment with a broad-spectrum antibiotic, and he is to call if he is not improving.

## 2014-08-28 NOTE — Progress Notes (Signed)
   Subjective:    Patient ID: Adam Passeharles D Koegel, [name], male    DOB: [birth year], 80 y.o.   MRN: [id number]  HPI The patient comes in today for an acute sick visit. He has known COPD, and was doing fairly well in his usual state of health until last night. He began to develop malaise, followed by significant shortness of breath. He was awake for most of the night, and continues to feel dyspneic the on his normal baseline. He denies any chest congestion, cough, fever, chills, or sweats. He has no pleuritic chest pain, and is maintained on Coumadin with adequate INRs. He denies any chest pressure or angina-like discomfort.   Review of Systems  Constitutional: Negative for fever and unexpected weight change.  HENT: Negative for congestion, dental problem, ear pain, nosebleeds, postnasal drip, rhinorrhea, sinus pressure, sneezing, sore throat and trouble swallowing.   Eyes: Negative for redness and itching.  Respiratory: Positive for chest tightness and shortness of breath. Negative for cough and wheezing.   Cardiovascular: Negative for palpitations and leg swelling.  Gastrointestinal: Negative for nausea and vomiting.  Genitourinary: Negative for dysuria.  Musculoskeletal: Negative for joint swelling.  Skin: Negative for rash.  Neurological: Negative for headaches.  Hematological: Does not bruise/bleed easily.  Psychiatric/Behavioral: Negative for dysphoric mood. The patient is not nervous/anxious.        Objective:   Physical Exam Overweight male in no acute distress Nose without purulence or discharge noted Neck without lymphadenopathy or thyromegaly Chest with loud crackles in the right base, no wheezing Cardiac exam with regular rate and rhythm Lower extremities with no significant edema, no cyanosis Alert and oriented, moves all 4 extremities.       Assessment & Plan:

## 2014-08-29 ENCOUNTER — Telehealth: Payer: Self-pay | Admitting: Pulmonary Disease

## 2014-08-29 ENCOUNTER — Telehealth: Payer: Self-pay | Admitting: *Deleted

## 2014-08-29 MED ORDER — PREDNISONE 10 MG PO TABS: ORAL_TABLET | ORAL | Status: DC

## 2014-08-29 NOTE — Telephone Encounter (Signed)
Pt aware of recs. RX sent in. Nothing further needed 

## 2014-08-29 NOTE — Telephone Encounter (Signed)
Spoke with pt. States that he is not feeling any better since starting Levaquin yesterday. Still reporting SOB and chest tightness. Was told to call if he didn't feel better.  KC - please advise. Thanks.

## 2014-08-29 NOTE — Telephone Encounter (Signed)
Ok to call in prednisone over 8 days in the event this is a copd flare, but if his chest tightness is prolonged or worsens, he may need to go to ER to make sure not a superimposed cardiac issue.

## 2014-08-29 NOTE — Telephone Encounter (Signed)
Patient called to state that he went to the Pulmonologist on 08/28/14 and he was started on levaquin 750mg  daily.  Advised to reschedule appointment and we made an appointment for 08/30/14.

## 2014-08-30 ENCOUNTER — Ambulatory Visit (INDEPENDENT_AMBULATORY_CARE_PROVIDER_SITE_OTHER): Payer: Commercial Managed Care - HMO | Admitting: *Deleted

## 2014-08-30 DIAGNOSIS — I350 Nonrheumatic aortic (valve) stenosis: Secondary | ICD-10-CM

## 2014-08-30 DIAGNOSIS — Z5181 Encounter for therapeutic drug level monitoring: Secondary | ICD-10-CM | POA: Diagnosis not present

## 2014-08-30 DIAGNOSIS — I4891 Unspecified atrial fibrillation: Secondary | ICD-10-CM

## 2014-08-30 LAB — POCT INR: INR: 2.5

## 2014-08-31 ENCOUNTER — Telehealth: Payer: Self-pay | Admitting: Pulmonary Disease

## 2014-08-31 NOTE — Telephone Encounter (Signed)
[phone] returning call

## 2014-08-31 NOTE — Telephone Encounter (Signed)
lmomtcb x1 

## 2014-08-31 NOTE — Telephone Encounter (Signed)
Pt cb, [phone]

## 2014-08-31 NOTE — Telephone Encounter (Signed)
Called pt. He had already cancelled appt but [facility] He is keeping that appt.

## 2014-08-31 NOTE — Telephone Encounter (Signed)
Called # provided below and was advised had the wrong #. Called home # and LMTCB

## 2014-09-03 ENCOUNTER — Ambulatory Visit (INDEPENDENT_AMBULATORY_CARE_PROVIDER_SITE_OTHER): Payer: Commercial Managed Care - HMO | Admitting: *Deleted

## 2014-09-03 DIAGNOSIS — Z5181 Encounter for therapeutic drug level monitoring: Secondary | ICD-10-CM

## 2014-09-03 DIAGNOSIS — I350 Nonrheumatic aortic (valve) stenosis: Secondary | ICD-10-CM

## 2014-09-03 DIAGNOSIS — I4891 Unspecified atrial fibrillation: Secondary | ICD-10-CM

## 2014-09-03 LAB — POCT INR: INR: 3.2

## 2014-09-03 MED ORDER — WARFARIN SODIUM 5 MG PO TABS: ORAL_TABLET | ORAL | Status: DC

## 2014-09-11 ENCOUNTER — Ambulatory Visit: Payer: Self-pay | Admitting: Nurse Practitioner

## 2014-09-17 ENCOUNTER — Ambulatory Visit (INDEPENDENT_AMBULATORY_CARE_PROVIDER_SITE_OTHER): Payer: Commercial Managed Care - HMO | Admitting: *Deleted

## 2014-09-17 DIAGNOSIS — I350 Nonrheumatic aortic (valve) stenosis: Secondary | ICD-10-CM | POA: Diagnosis not present

## 2014-09-17 DIAGNOSIS — I4891 Unspecified atrial fibrillation: Secondary | ICD-10-CM | POA: Diagnosis not present

## 2014-09-17 DIAGNOSIS — Z5181 Encounter for therapeutic drug level monitoring: Secondary | ICD-10-CM

## 2014-09-17 LAB — POCT INR: INR: 4

## 2014-09-19 ENCOUNTER — Ambulatory Visit: Payer: Medicare Other | Admitting: Nurse Practitioner

## 2014-10-02 ENCOUNTER — Encounter (INDEPENDENT_AMBULATORY_CARE_PROVIDER_SITE_OTHER): Payer: Self-pay

## 2014-10-02 ENCOUNTER — Encounter: Payer: Self-pay | Admitting: Pulmonary Disease

## 2014-10-02 ENCOUNTER — Ambulatory Visit (INDEPENDENT_AMBULATORY_CARE_PROVIDER_SITE_OTHER): Payer: Commercial Managed Care - HMO | Admitting: Pulmonary Disease

## 2014-10-02 VITALS — BP 110/56 | HR 115 | Temp 97.0°F | Ht 64.5 in | Wt 182.8 lb

## 2014-10-02 DIAGNOSIS — J438 Other emphysema: Secondary | ICD-10-CM | POA: Diagnosis not present

## 2014-10-02 DIAGNOSIS — J189 Pneumonia, unspecified organism: Secondary | ICD-10-CM

## 2014-10-02 NOTE — Assessment & Plan Note (Signed)
The patient feels that he is breathing at an adequate level, but did need a short course of prednisone with his antibiotic treatment for pneumonia. He has no wheezing on exam today. I've asked him to continue on his bronchodilator regimen, and to stay as active as possible.

## 2014-10-02 NOTE — Assessment & Plan Note (Signed)
The patient is much improved, and feels that he has nearly gotten back to his usual baseline.  He has no further chest congestion or purulence, and is just starting to get his strength back.

## 2014-10-02 NOTE — Progress Notes (Signed)
   Subjective:    Patient ID: Adam Passeharles D Bare, [name], male    DOB: [birth year], 80 y.o.   MRN: [id number]  HPI The patient comes in today for follow-up of his known COPD, and also community-acquired pneumonia diagnosed at the last visit. He required antibiotics, and then a course of prednisone for increased shortness of breath. He feels that he is nearly back to his usual baseline from a breathing standpoint, but is taking a little longer to get his strength back. He is staying on his bronchodilator regimen, and has no significant cough or mucus at this time.   Review of Systems  Constitutional: Negative for fever and unexpected weight change.  HENT: Positive for congestion and postnasal drip. Negative for dental problem, ear pain, nosebleeds, rhinorrhea, sinus pressure, sneezing, sore throat and trouble swallowing.   Eyes: Negative for redness and itching.  Respiratory: Positive for cough, shortness of breath and wheezing. Negative for chest tightness.   Cardiovascular: Negative for palpitations and leg swelling.  Gastrointestinal: Negative for nausea and vomiting.  Genitourinary: Negative for dysuria.  Musculoskeletal: Negative for joint swelling.  Skin: Negative for rash.  Neurological: Negative for headaches.  Hematological: Does not bruise/bleed easily.  Psychiatric/Behavioral: Negative for dysphoric mood. The patient is not nervous/anxious.        Objective:   Physical Exam Overweight male in no acute distress Nose without purulence or discharge noted Neck without lymphadenopathy or thyromegaly Chest with faint basilar crackles, but much improved from the last visit. No wheezing noted Cardiac exam with regular rate and rhythm Lower extremities with minimal edema, no cyanosis Alert and oriented, moves all 4 extremities.       Assessment & Plan:

## 2014-10-02 NOTE — Patient Instructions (Signed)
Continue on your breathing medications. Stay as active as possible. followup with me again in 6mos

## 2014-10-03 ENCOUNTER — Ambulatory Visit (INDEPENDENT_AMBULATORY_CARE_PROVIDER_SITE_OTHER): Payer: Commercial Managed Care - HMO | Admitting: *Deleted

## 2014-10-03 DIAGNOSIS — I350 Nonrheumatic aortic (valve) stenosis: Secondary | ICD-10-CM

## 2014-10-03 DIAGNOSIS — Z5181 Encounter for therapeutic drug level monitoring: Secondary | ICD-10-CM | POA: Diagnosis not present

## 2014-10-03 DIAGNOSIS — I4891 Unspecified atrial fibrillation: Secondary | ICD-10-CM

## 2014-10-03 LAB — POCT INR: INR: 2.9

## 2014-10-08 ENCOUNTER — Telehealth: Payer: Self-pay | Admitting: Pulmonary Disease

## 2014-10-08 NOTE — Telephone Encounter (Signed)
Pt has [employer] LMTCB x 1

## 2014-10-09 NOTE — Telephone Encounter (Signed)
lmtcb X2 for pt.  

## 2014-10-10 NOTE — Telephone Encounter (Signed)
Spoke with patient, states that when he came to his appt 08/28/14 that his insurance was checked for coverage of the appt.  Pt states that the coverage for this visit is now denied.  Please advise Adam [name] what can be done for this - pt has [employer]. Thanks.

## 2014-10-11 ENCOUNTER — Encounter: Payer: Self-pay | Admitting: Nurse Practitioner

## 2014-10-11 ENCOUNTER — Ambulatory Visit (INDEPENDENT_AMBULATORY_CARE_PROVIDER_SITE_OTHER): Payer: Commercial Managed Care - HMO | Admitting: Nurse Practitioner

## 2014-10-11 VITALS — BP 166/69 | HR 71 | Ht 64.5 in | Wt 187.6 lb

## 2014-10-11 DIAGNOSIS — F0151 Vascular dementia with behavioral disturbance: Secondary | ICD-10-CM

## 2014-10-11 DIAGNOSIS — R413 Other amnesia: Secondary | ICD-10-CM | POA: Diagnosis not present

## 2014-10-11 MED ORDER — MEMANTINE HCL 10 MG PO TABS: 10.0000 mg | ORAL_TABLET | Freq: Two times a day (BID) | ORAL | Status: DC

## 2014-10-11 NOTE — Progress Notes (Addendum)
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 He was last seen in this office 06/20/2014. He has a history of mild dementia. He also has a history of obstructive sleep apnea however he does not use his CPAP. CT of the head June 2015 with Moderate atrophy and chronic microvascular ischemic change,stable from the prior brain MRI. He is currently on Namenda 10 mg twice daily for his memory and Aricept was added at his last visit however he stopped the medication about 3 weeks ago after being continuously nauseated and vomiting. He has been on the Exelon patch in the past and had side effects to that. MMSE today is 27 out of 30. 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 As to [place] was last seen in June by [name] our nurse practitioner. He has been followed for memory difficulties and had also undergone a spinal tap to rule out normal pressure hydrocephalus. He performed today very well on the Mini-Mental Status Examination at 26 out of 30 points his main problem is a short term recall of 3 words but he is visually spatially doing very well and could answer all orientation questions. He was further tested with a M all CEA Montral cognitive assessment test did well on a copy of a three-dimensional cube again the clock face was normal he had some difficulties with the trail making part. He could name 2 out of 3 animals correctly ditzy subtraction serial sevens did well with language but had some trouble generating words with a certain R at the beginning. And again his main difficulties were now 5 word recall. He has difficulties reading, as he forgets the beginning information a he  continues to follow the story. The same with movies.  The diagnosis is still mild dementia , not cognitive amnestic impairment .   REVIEW OF SYSTEMS: Full 14 system review of systems performed and notable only for those listed, all others are neg:  Constitutional: Fatigue Cardiovascular: neg Ear/Nose/Throat: neg  Skin: neg Eyes: neg Respiratory: neg Gastroitestinal: Urinary frequency  Hematology/Lymphatic: neg  Endocrine: neg Musculoskeletal:neg Allergy/Immunology: neg Neurological: neg Psychiatric: neg Sleep : neg   ALLERGIES: Allergies  Allergen Reactions  . Doxycycline Swelling    swollen tongue    HOME MEDICATIONS: Outpatient Prescriptions Prior to Visit  Medication Sig Dispense Refill  . albuterol (PROVENTIL HFA;VENTOLIN HFA) 108 (90 BASE) MCG/ACT inhaler Inhale 2 puffs into the lungs every 6 (six) hours as needed for wheezing. 1 Inhaler 6  . aspirin EC 325 MG EC tablet Take 1 tablet (325 mg total) by mouth daily. 30 tablet   . budesonide-formoterol (SYMBICORT) 160-4.5 MCG/ACT inhaler Inhale 2 puffs into the lungs 2 (two) times daily. (Patient taking differently: Inhale 2 puffs into the lungs daily. ) 1 Inhaler 5  . cilostazol (PLETAL) 50 MG tablet Take 50 mg by mouth 2 (two) times daily.    . fluticasone (FLONASE) 50 MCG/ACT nasal spray Place 1-2 sprays into both nostrils daily.     . insulin glargine (LANTUS) 100 UNIT/ML injection Inject 20 Units into the skin at bedtime.     . insulin lispro (HUMALOG) 100 UNIT/ML injection Inject 5 Units into the skin as  needed for high blood sugar.    . losartan-hydrochlorothiazide (HYZAAR) 100-12.5 MG per tablet Once daily    . Meclizine HCl (ANTIVERT PO) Take by mouth as directed.    . memantine (NAMENDA) 10 MG tablet Take 1 tablet (10 mg total) by mouth 2 (two) times daily. 180 tablet 1  . metFORMIN (GLUCOPHAGE-XR) 500 MG 24 hr tablet Take 1,000 mg by mouth at bedtime.      . metoprolol (LOPRESSOR) 50 MG tablet Take 1 tablet  (50 mg total) by mouth 2 (two) times daily. 180 tablet 1  . NITROSTAT 0.4 MG SL tablet as needed.    . NON FORMULARY Place 2 L into the nose at bedtime.    . Tamsulosin HCl (FLOMAX) 0.4 MG CAPS Take 0.4 mg by mouth at bedtime.     [name]. venlafaxine (EFFEXOR) 75 MG tablet Take 150mg  in the morning and 75mg  at bedtime.    [name]. warfarin (COUMADIN) 5 MG tablet Take as directed by coumadin clinic 150 tablet 1  . zolpidem (AMBIEN) 5 MG tablet Take 5 mg by mouth at bedtime.    . EXELON 4.6 MG/24HR Place 1 patch (4.6 mg total) onto the skin daily. (Patient not taking: Reported on 10/11/2014) 30 patch 6   No facility-administered medications prior to visit.    PAST MEDICAL HISTORY: Past Medical History  Diagnosis Date  . Hypertension   . Hyperlipidemia   . Type II or unspecified type diabetes mellitus without mention of complication, not stated as uncontrolled   . Nephrolithiasis   . Atrial flutter     s/p CTI ablation by Dr Ladona Ridgelaylor 1/12  . CAD (coronary artery disease)     s/p CABG x 5 2003 by Dr Laneta SimmersBartle  . Aortic stenosis, moderate     moderate to severe per echo 03/2012 with AVR in March of 2014  . DJD (degenerative joint disease)   . DDD (degenerative disc disease)   . Spinal stenosis of lumbar region   . Bifascicular block   . Atrial tachycardia   . Anxiety   . Depression   . S/P AVR (aortic valve replacement)   . OSA (obstructive sleep apnea)   . 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 Diverticulosis   . Hemorrhoids     PAST SURGICAL HISTORY: Past Surgical History  Procedure Laterality Date  . Cabg  2003    by Dr Laneta SimmersBartle  . Anal fissure repair    . Leg surgery      right  . Trigger finger release      bilat.  . Tonsillectomy    . Back surgery  2010  . Atrial ablation surgery  1/12    atrial flutter ablation by Dr Ladona Ridgelaylor  . Cardioversion  04/07/2012    Procedure: CARDIOVERSION;  Surgeon: Wendall StadePeter C Nishan, MD;  [facility];  Service: Cardiovascular;   Laterality: N/A;  . Coronary artery bypass graft      10 yrs ago   03  . Aortic valve replacement N/A 09/01/2012    Procedure: AORTIC VALVE REPLACEMENT (AVR);  Surgeon: Alleen BorneBryan K Bartle, MD;  [facility];  Service: Open Heart Surgery;  Laterality: N/A;  . Intraoperative transesophageal echocardiogram N/A 09/01/2012    Procedure: INTRAOPERATIVE TRANSESOPHAGEAL ECHOCARDIOGRAM;  Surgeon: Alleen BorneBryan K Bartle, MD;  [facility];  Service: Open Heart Surgery;  Laterality: N/A;    FAMILY HISTORY: Family History  Problem Relation Age of Onset  . Heart disease Father   . Heart disease Paternal  Grandfather   . Stroke Mother   . Rheum arthritis Mother   . Colon cancer Neg Hx   . Stomach cancer Neg Hx     SOCIAL HISTORY: History   Social History  . Marital Status: Married    Spouse Name: Lynnette  . Number of Children: 5  . Years of Education: college   Occupational History  . pharmacist     retired   Social History Main Topics  . Smoking status: Former Smoker -- 1.00 packs/day for 49 years    Quit date: 08/21/2001  . Smokeless tobacco: Never Used     Comment: started at age 35.   [name] Alcohol Use: No     Comment: quit 15 yrs ago  . Drug Use: No  . Sexual Activity: No   Other Topics Concern  . 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   Patient has two children and his wife has three children.   Patient has a college education.   Patient is right- handed.   Patient does not drink any caffeine.    He is a retired Teacher, early years/pre.     PHYSICAL EXAM  Filed Vitals:   10/11/14 1121  BP: 166/69  Pulse: 71  Height: 5' 4.5" (1.638 m)  Weight: 187 lb 9.6 oz (85.095 kg)   Body mass index is 31.72 kg/(m^2). General: The patient is awake, alert and appears not in acute distress. The patient is well groomed.  Head: Normocephalic, atraumatic.  Neck is supple.  Cardiovascular: Regular rate and rhythm ,  Respiratory: Lungs  are clear to auscultation.  Trunk: BMI is elevated.  Neurologic exam:    Memory subjective described as impaired. There is an abnormal attention span & concentration ability. He perseverates. The patient is sluggish,  alert, oriented to place and time. MMSE 27/30. He missed all 3 recall questions .AFT 10.Clock drawing 1/4. Speech is fluent without Dysarthria, but with dysphonia . Mood and affect are appropriate.  Cranial nerves: Pupils are equal and briskly reactive to light.  Extraocular movements in vertical and horizontal planes intact and without nystagmus. Visual fields by finger perimetry are intact.  Hearing to finger rub intact. Facial sensation intact to fine touch. Facial motor strength is symmetric and tongue move midline. Left uvula lower, he has a drooping mouth, open while speaking, sitting at rest.  Motor exam: Increased rigidity, tone,and delayed response to motor commands. No focal weakness Sensory: Fine touch, pinprick and vibration were tested in all extremities.  Coordination: Rapid alternating movements in the fingers/hands is tested and normal. Finger-to-nose is slow without evidence of ataxia, dysmetria or tremor.  Gait and station: Patient walks without assistive device. Slow ambulation, bend forwards, wide-based and slow.  Tandem gait is mildly unsteady Turned with 2 steps.  Deep tendon reflexes: in the upper and lower extremities are symmetric and intact. Babinski maneuver response is downgoing.   DIAGNOSTIC DATA (LABS, IMAGING, TESTING) -  ASSESSMENT AND PLAN  80 y.o. year old male  has a past medical history of vascular dementia. CT of the head June 2015 with Moderate atrophy and chronic microvascular ischemic change,stable from the prior brain MRI. NPH has been ruled out after lumbar puncture. Has had side effects to Aricept and Exelon. He also has a gait instability due to previous back surgery.Gait instability, Wide based-masked face, cognitive  decline but not RLS, REM BD, and no tremor at rest. Could also be  due to  glucose levels. History of obstructive sleep apnea however no longer uses CPAP.  The patient is a current patient of Dr. Vickey Huger who is out of the office today . This note is sent to the work in doctor.      Continue Namenda twice daily for memory F/U in 6 months Nilda Riggs, [facility], [facility], APRN  [facility] [address] [place], [place] [address] [phone]  Agree with documented history, physical, neuro exam,assessment and plan as stated above.   Naomie Dean, MD [facility]

## 2014-10-11 NOTE — Telephone Encounter (Signed)
Adam LanesKrista - please advise what can be done for this patient.

## 2014-10-11 NOTE — Patient Instructions (Signed)
Continue Namenda twice daily for memory Has failed Aricept and Exelon  F/U in 6 months

## 2014-10-11 NOTE — Telephone Encounter (Signed)
This is a [place] go to [place] needed a referral per the denial and that is done up front

## 2014-10-15 NOTE — Telephone Encounter (Signed)
Called and talked to patient about [employer] Referral. Referral was placed starting 2/16-8/14 for 6 visits.  Nothing further needed at this time.

## 2014-11-09 ENCOUNTER — Ambulatory Visit (INDEPENDENT_AMBULATORY_CARE_PROVIDER_SITE_OTHER): Payer: Commercial Managed Care - HMO | Admitting: *Deleted

## 2014-11-09 DIAGNOSIS — I4891 Unspecified atrial fibrillation: Secondary | ICD-10-CM

## 2014-11-09 DIAGNOSIS — Z5181 Encounter for therapeutic drug level monitoring: Secondary | ICD-10-CM

## 2014-11-09 DIAGNOSIS — I350 Nonrheumatic aortic (valve) stenosis: Secondary | ICD-10-CM | POA: Diagnosis not present

## 2014-11-09 LAB — POCT INR: INR: 3

## 2014-12-07 ENCOUNTER — Ambulatory Visit (INDEPENDENT_AMBULATORY_CARE_PROVIDER_SITE_OTHER): Payer: Commercial Managed Care - HMO | Admitting: Pharmacist

## 2014-12-07 DIAGNOSIS — I4891 Unspecified atrial fibrillation: Secondary | ICD-10-CM | POA: Diagnosis not present

## 2014-12-07 DIAGNOSIS — I350 Nonrheumatic aortic (valve) stenosis: Secondary | ICD-10-CM

## 2014-12-07 DIAGNOSIS — Z5181 Encounter for therapeutic drug level monitoring: Secondary | ICD-10-CM

## 2014-12-07 LAB — POCT INR: INR: 5.2

## 2014-12-21 ENCOUNTER — Ambulatory Visit (INDEPENDENT_AMBULATORY_CARE_PROVIDER_SITE_OTHER): Payer: Commercial Managed Care - HMO | Admitting: *Deleted

## 2014-12-21 DIAGNOSIS — I4891 Unspecified atrial fibrillation: Secondary | ICD-10-CM

## 2014-12-21 DIAGNOSIS — I350 Nonrheumatic aortic (valve) stenosis: Secondary | ICD-10-CM | POA: Diagnosis not present

## 2014-12-21 DIAGNOSIS — Z5181 Encounter for therapeutic drug level monitoring: Secondary | ICD-10-CM

## 2014-12-21 LAB — POCT INR: INR: 3.8

## 2015-01-04 ENCOUNTER — Ambulatory Visit (INDEPENDENT_AMBULATORY_CARE_PROVIDER_SITE_OTHER): Payer: Commercial Managed Care - HMO | Admitting: Pharmacist

## 2015-01-04 DIAGNOSIS — I350 Nonrheumatic aortic (valve) stenosis: Secondary | ICD-10-CM

## 2015-01-04 DIAGNOSIS — Z5181 Encounter for therapeutic drug level monitoring: Secondary | ICD-10-CM | POA: Diagnosis not present

## 2015-01-04 DIAGNOSIS — I4891 Unspecified atrial fibrillation: Secondary | ICD-10-CM | POA: Diagnosis not present

## 2015-01-04 LAB — POCT INR: INR: 4.3

## 2015-01-17 ENCOUNTER — Ambulatory Visit (INDEPENDENT_AMBULATORY_CARE_PROVIDER_SITE_OTHER): Payer: Commercial Managed Care - HMO

## 2015-01-17 DIAGNOSIS — Z5181 Encounter for therapeutic drug level monitoring: Secondary | ICD-10-CM

## 2015-01-17 DIAGNOSIS — I4891 Unspecified atrial fibrillation: Secondary | ICD-10-CM | POA: Diagnosis not present

## 2015-01-17 DIAGNOSIS — I350 Nonrheumatic aortic (valve) stenosis: Secondary | ICD-10-CM | POA: Diagnosis not present

## 2015-01-17 LAB — POCT INR: INR: 3.1

## 2015-02-19 ENCOUNTER — Ambulatory Visit (INDEPENDENT_AMBULATORY_CARE_PROVIDER_SITE_OTHER): Payer: Commercial Managed Care - HMO | Admitting: Pharmacist

## 2015-02-19 DIAGNOSIS — I350 Nonrheumatic aortic (valve) stenosis: Secondary | ICD-10-CM | POA: Diagnosis not present

## 2015-02-19 DIAGNOSIS — I4891 Unspecified atrial fibrillation: Secondary | ICD-10-CM | POA: Diagnosis not present

## 2015-02-19 DIAGNOSIS — Z5181 Encounter for therapeutic drug level monitoring: Secondary | ICD-10-CM | POA: Diagnosis not present

## 2015-02-19 LAB — POCT INR: INR: 2.9

## 2015-03-19 ENCOUNTER — Ambulatory Visit (INDEPENDENT_AMBULATORY_CARE_PROVIDER_SITE_OTHER): Payer: Commercial Managed Care - HMO | Admitting: Pharmacist Clinician (PhC)/ Clinical Pharmacy Specialist

## 2015-03-19 DIAGNOSIS — Z5181 Encounter for therapeutic drug level monitoring: Secondary | ICD-10-CM | POA: Diagnosis not present

## 2015-03-19 DIAGNOSIS — I4891 Unspecified atrial fibrillation: Secondary | ICD-10-CM | POA: Diagnosis not present

## 2015-03-19 DIAGNOSIS — I350 Nonrheumatic aortic (valve) stenosis: Secondary | ICD-10-CM | POA: Diagnosis not present

## 2015-03-19 LAB — POCT INR: INR: 3

## 2015-04-11 ENCOUNTER — Ambulatory Visit (INDEPENDENT_AMBULATORY_CARE_PROVIDER_SITE_OTHER): Payer: Commercial Managed Care - HMO | Admitting: Neurology

## 2015-04-11 ENCOUNTER — Encounter: Payer: Self-pay | Admitting: Neurology

## 2015-04-11 VITALS — BP 132/62 | HR 80 | Resp 20 | Ht 65.0 in | Wt 178.0 lb

## 2015-04-11 DIAGNOSIS — F09 Unspecified mental disorder due to known physiological condition: Secondary | ICD-10-CM | POA: Diagnosis not present

## 2015-04-11 DIAGNOSIS — F0151 Vascular dementia with behavioral disturbance: Secondary | ICD-10-CM | POA: Diagnosis not present

## 2015-04-11 MED ORDER — MEMANTINE HCL 10 MG PO TABS: 10.0000 mg | ORAL_TABLET | Freq: Two times a day (BID) | ORAL | Status: AC

## 2015-04-11 NOTE — Progress Notes (Signed)
[facility]  PATIENT: Adam [name] DOB: 15-Oct-1934   REASON FOR VISIT: follow up for memory loss, vascular dementia, progression HISTORY FROM: Patient    HISTORY OF PRESENT ILLNESS:   04-11-15, Adam. 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 He was last seen in this office by Adam [name] in March 2016. [name] He has a history of mild , vascular dementia. last visits MMSE 27/30. He missed all 3 recall questions .AFT 10.Clock drawing 1/4. Speech is fluent without Dysarthria, but with dysphonia . Mood and affect are appropriate.   He also has a history of obstructive sleep apnea however he does not longer use his CPAP.  CT of the head June 2015 with Moderate atrophy and chronic microvascular ischemic change,stable from the prior brain MRI.  He is currently on Namenda 10 mg twice daily for his memory and Aricept was added 2015  - however he stopped the medication about 3 weeks later after being continuously nauseated and vomiting.  He has been on the Exelon patch in the past and had side effects to that.  Last MMSE today is 27 out of 30.  He has no hobbies, he does not exercise He returns for reevaluation, lives with his wife, who is today not with him. Fatigue severity score was endorsed at 33 points an Epworth sleepiness score at 5 points, a mini mental status examination was performed at 22 points, and a Montral cognitive assessment wa aborted after the patient was unable to perform a trail making test, he drew a clock face but he did not place the fingers of the clock correctly. He draw a cube, name 3 animals but did not perform well on attention language and obstruction. He could not recall any of the 5 recall words but was fully oriented to date time and place.   Adam. Eulah [name] reports that he has not felt palpitations or irregular heartbeats but he has in the past often not known when he had atrial fibrillation. He remains on antiplatelets, he  has also been prescribed as needed Ambien at night, meclizine for dizziness which he currently doesn't need.CD  HISTORY: Adam. Eulah [name], Adam [name] D; comes to the office today for followup accompanied by his wife. [name] last seen in June by Adam [name] our nurse practitioner. He has been followed for memory difficulties and had also undergone a spinal tap to rule out normal pressure hydrocephalus. He performed today very well on the Mini-Mental Status Examination at 26 out of 30 points his main problem is a short term recall of 3 words but he is visually spatially doing very well and could answer all orientation questions. He was further tested with a M all CEA Montral cognitive assessment test did well on a copy of a three-dimensional cube again the clock face was normal he had some difficulties with the trail making part. He could name 2 out of 3 animals correctly ditzy subtraction serial sevens did well with language but had some trouble generating words with a certain R at the beginning. And again his main difficulties were now 5 word recall. He has difficulties reading, as he forgets the beginning information a he continues to follow the story. The same with movies.  The diagnosis is still mild dementia , not cognitive amnestic impairment .   REVIEW OF SYSTEMS: Full 14 system review of systems performed and notable only for those listed, all others are neg:  Constitutional: Fatigue Gastroitestinal: Urinary frequency  Sleep :  one bathroom trip nightly. Does not recall dreams.    ALLERGIES: Allergies  Allergen Reactions  . Doxycycline Swelling    swollen tongue    HOME MEDICATIONS: Outpatient Prescriptions Prior to Visit  Medication Sig Dispense Refill  . albuterol (PROVENTIL HFA;VENTOLIN HFA) 108 (90 BASE) MCG/ACT inhaler Inhale 2 puffs into the lungs every 6 (six) hours as needed for wheezing. 1 Inhaler 6  . aspirin EC 325 MG EC tablet Take 1 tablet (325 mg total) by mouth daily. 30  tablet   . budesonide-formoterol (SYMBICORT) 160-4.5 MCG/ACT inhaler Inhale 2 puffs into the lungs 2 (two) times daily. (Patient taking differently: Inhale 2 puffs into the lungs daily. ) 1 Inhaler 5  . cilostazol (PLETAL) 50 MG tablet Take 50 mg by mouth 2 (two) times daily.    . fluticasone (FLONASE) 50 MCG/ACT nasal spray Place 1-2 sprays into both nostrils daily.     . insulin glargine (LANTUS) 100 UNIT/ML injection Inject 20 Units into the skin at bedtime.     . insulin lispro (HUMALOG) 100 UNIT/ML injection Inject 5 Units into the skin as needed for high blood sugar.    . losartan-hydrochlorothiazide (HYZAAR) 100-12.5 MG per tablet Once daily    . Meclizine HCl (ANTIVERT PO) Take by mouth as directed.    . memantine (NAMENDA) 10 MG tablet Take 1 tablet (10 mg total) by mouth 2 (two) times daily. 180 tablet 2  . metFORMIN (GLUCOPHAGE-XR) 500 MG 24 hr tablet Take 1,000 mg by mouth at bedtime.      . metoprolol (LOPRESSOR) 50 MG tablet Take 1 tablet (50 mg total) by mouth 2 (two) times daily. 180 tablet 1  . NITROSTAT 0.4 MG SL tablet as needed.    . NON FORMULARY Place 2 L into the nose at bedtime.    . Tamsulosin HCl (FLOMAX) 0.4 MG CAPS Take 0.4 mg by mouth at bedtime.     [name]. venlafaxine (EFFEXOR) 75 MG tablet Take 150mg  in the morning and 75mg  at bedtime.    [name]. warfarin (COUMADIN) 5 MG tablet Take as directed by coumadin clinic 150 tablet 1  . zolpidem (AMBIEN) 5 MG tablet Take 5 mg by mouth at bedtime.     No facility-administered medications prior to visit.    PAST MEDICAL HISTORY: Past Medical History  Diagnosis Date  . Hypertension   . Hyperlipidemia   . Type II or unspecified type diabetes mellitus without mention of complication, not stated as uncontrolled   . Nephrolithiasis   . Atrial flutter [facility](HCC)     s/p CTI ablation by Adam [name] 1/12  . CAD (coronary artery disease)     s/p CABG x 5 2003 by Adam [name]  . Aortic stenosis, moderate     moderate to severe per echo 03/2012  with AVR in March of 2014  . DJD (degenerative joint disease)   . DDD (degenerative disc disease)   . Spinal stenosis of lumbar region   . Bifascicular block   . Atrial tachycardia (HCC)   . Anxiety   . Depression   . S/P AVR (aortic valve replacement)   . OSA (obstructive sleep apnea)   . 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 Diverticulosis   . Hemorrhoids     PAST SURGICAL HISTORY: Past Surgical History  Procedure Laterality Date  . Cabg  2003    by Adam [name]  . Anal fissure repair    . Leg surgery      right  .  Trigger finger release      bilat.  . Tonsillectomy    . Back surgery  2010  . Atrial ablation surgery  1/12    atrial flutter ablation by Adam Ladona Ridgel  . Cardioversion  04/07/2012    Procedure: CARDIOVERSION;  Surgeon: Wendall Stade, MD;  Location: [facility] ENDOSCOPY;  Service: Cardiovascular;  Laterality: N/A;  . Coronary artery bypass graft      10 yrs ago   03  . Aortic valve replacement N/A 09/01/2012    Procedure: AORTIC VALVE REPLACEMENT (AVR);  Surgeon: Alleen Borne, MD;  Location: [facility] OR;  Service: Open Heart Surgery;  Laterality: N/A;  . Intraoperative transesophageal echocardiogram N/A 09/01/2012    Procedure: INTRAOPERATIVE TRANSESOPHAGEAL ECHOCARDIOGRAM;  Surgeon: Alleen Borne, MD;  Location: [facility] OR;  Service: Open Heart Surgery;  Laterality: N/A;    FAMILY HISTORY: Family History  Problem Relation Age of Onset  . Heart disease Father   . Heart disease Paternal Grandfather   . Stroke Mother   . Rheum arthritis Mother   . Colon cancer Neg Hx   . Stomach cancer Neg Hx     SOCIAL HISTORY: Social History   Social History  . Marital Status: Married    Spouse Name: Lynnette  . Number of Children: 5  . Years of Education: college   Occupational History  . pharmacist     retired   Social History Main Topics  . Smoking status: Former Smoker -- 1.00 packs/day for 49 years    Quit date: 08/21/2001  . Smokeless tobacco:  Never Used     Comment: started at age 68.   [name] Alcohol Use: No     Comment: quit 15 yrs ago  . Drug Use: No  . Sexual Activity: No   Other Topics Concern  . 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   Patient has two children and his wife has three children.   Patient has a college education.   Patient is right- handed.   Patient does not drink any caffeine.    He is a retired Teacher, early years/pre.     PHYSICAL EXAM  Filed Vitals:   04/11/15 1049  BP: 132/62  Pulse: 80  Resp: 20  Height:  (1.651 m)  Weight: 178 lb (80.74 kg)   Body mass index is 29.62 kg/(m^2).   There is mild retrognathia, no TMJ pain, neck circumference was 16 inches, no bruxism reported General: The patient is awake, alert and appears not in acute distress. The patient is well groomed.  Head: Normocephalic, atraumatic.  Neck is supple.  Cardiovascular: Regular rate and rhythm ,  Respiratory: Lungs are clear to auscultation.  Trunk: BMI is elevated. There is no evidence of any ankle edema that there are significant skin discolorations and petechiae from antiplatelet treatment I suppose. The back of either hand has been bruised over and over.  Neurologic exam:    Memory subjective described as impaired. Confirmed by [facility] cognitive assessment test failure and decline in MMSE .There is an abnormal attention span & concentration ability. oriented to place and time. Cranial nerves: Pupils are equal and briskly reactive to light.  Extraocular movements in vertical and horizontal planes intact and without nystagmus. Visual fields by finger perimetry are intact.  Hearing to finger rub intact. Facial sensation intact to fine touch. Facial motor strength is symmetric and tongue move midline.   Motor exam:  Increased rigidity, tone,and delayed response to motor commands. No focal weakness.  Sensory: Fine touch, pinprick and vibration were intact   Coordination: Rapid alternating movements in the fingers/hands were quick and exact, the patient provided a good grip strength Finger-to-nose is slow without evidence of ataxia, dysmetria or tremor.  Gait and station: Patient walks without assistive device. Slow ambulation, bend forwards, wide-based and slow.   Tandem gait is  unsteady Turned with 3 steps.  Deep tendon reflexes: in the upper and lower extremities are symmetric and intact. Babinski maneuver response is downgoing.   DIAGNOSTIC DATA (LABS, IMAGING, TESTING) No new tests to review  ASSESSMENT AND PLAN  80 y.o. year old male  has a past medical history of vascular dementia.  CT of the head June 2015 with Moderate atrophy and chronic microvascular ischemic change,stable from the prior brain MRI.  NPH has been ruled out after lumbar puncture.  Has had side effects to Aricept and Exelon. Currently treated on Namenda only his memory loss has mildly progressed since last time, but he was fully oriented to date and place and person. His difficulties were with visual spatial Paske's on a Montral cognitive assessment score. The MMSE was 22 points and the last visit 27. He does not report fatigue or excessive daytime sleepiness he does not appear depressed.   He also has a gait instability due to previous back surgery.Gait instability,  Wide based-masked face, cognitive decline but not RLS, REM BD, and no tremor at rest. Could also be due to uncontrolled  glucose levels.   The patient just underwent cataract surgery in the right eye 6 weeks ago and is expected to have left eye surgery in the next 2 weeks or so.  This is a scheduled revisit with 25 minutes duration of which more than 50% of face-to-face time is dedicated to information of natural progression of the disease, social and living arrangements and coordination of care.  PLan: Continue Namenda twice daily for memory, refilled . I am concerned about his ability to drive, he  is not! Unfortunately his wife is not here today, she is a driver and should be the only driver in the household.   F/U in 6-8 months with MMSE, 30 Minutes. Please remind Adam Helaine Chess' wife to join to the next visit.   [facility] [address], [address] [place], [place] [address] [phone]   Cc ; Adam Eloise Harman, [facility]

## 2015-04-19 ENCOUNTER — Ambulatory Visit (INDEPENDENT_AMBULATORY_CARE_PROVIDER_SITE_OTHER): Payer: Commercial Managed Care - HMO | Admitting: *Deleted

## 2015-04-19 DIAGNOSIS — I4891 Unspecified atrial fibrillation: Secondary | ICD-10-CM

## 2015-04-19 DIAGNOSIS — Z5181 Encounter for therapeutic drug level monitoring: Secondary | ICD-10-CM | POA: Diagnosis not present

## 2015-04-19 DIAGNOSIS — I350 Nonrheumatic aortic (valve) stenosis: Secondary | ICD-10-CM | POA: Diagnosis not present

## 2015-04-19 LAB — POCT INR: INR: 3.5

## 2015-05-15 ENCOUNTER — Ambulatory Visit (INDEPENDENT_AMBULATORY_CARE_PROVIDER_SITE_OTHER): Payer: Commercial Managed Care - HMO | Admitting: *Deleted

## 2015-05-15 DIAGNOSIS — I4891 Unspecified atrial fibrillation: Secondary | ICD-10-CM | POA: Diagnosis not present

## 2015-05-15 DIAGNOSIS — I350 Nonrheumatic aortic (valve) stenosis: Secondary | ICD-10-CM | POA: Diagnosis not present

## 2015-05-15 DIAGNOSIS — Z5181 Encounter for therapeutic drug level monitoring: Secondary | ICD-10-CM | POA: Diagnosis not present

## 2015-05-15 LAB — POCT INR: INR: 3.2

## 2015-06-05 ENCOUNTER — Ambulatory Visit (INDEPENDENT_AMBULATORY_CARE_PROVIDER_SITE_OTHER): Payer: Commercial Managed Care - HMO | Admitting: *Deleted

## 2015-06-05 DIAGNOSIS — I4891 Unspecified atrial fibrillation: Secondary | ICD-10-CM | POA: Diagnosis not present

## 2015-06-05 DIAGNOSIS — I350 Nonrheumatic aortic (valve) stenosis: Secondary | ICD-10-CM | POA: Diagnosis not present

## 2015-06-05 DIAGNOSIS — Z5181 Encounter for therapeutic drug level monitoring: Secondary | ICD-10-CM

## 2015-06-05 LAB — POCT INR: INR: 3.8

## 2015-06-23 ENCOUNTER — Emergency Department (HOSPITAL_BASED_OUTPATIENT_CLINIC_OR_DEPARTMENT_OTHER)
Admission: EM | Admit: 2015-06-23 | Discharge: 2015-06-23 | Disposition: A | Discharge status: Home / Self Care | Payer: Medicare Other | Attending: Emergency Medicine | Admitting: Emergency Medicine

## 2015-06-23 ENCOUNTER — Encounter (HOSPITAL_BASED_OUTPATIENT_CLINIC_OR_DEPARTMENT_OTHER): Payer: Self-pay | Admitting: *Deleted

## 2015-06-23 DIAGNOSIS — Y9289 Other specified places as the place of occurrence of the external cause: Secondary | ICD-10-CM | POA: Insufficient documentation

## 2015-06-23 DIAGNOSIS — E119 Type 2 diabetes mellitus without complications: Secondary | ICD-10-CM | POA: Insufficient documentation

## 2015-06-23 DIAGNOSIS — Z23 Encounter for immunization: Secondary | ICD-10-CM | POA: Diagnosis not present

## 2015-06-23 DIAGNOSIS — Z7982 Long term (current) use of aspirin: Secondary | ICD-10-CM | POA: Insufficient documentation

## 2015-06-23 DIAGNOSIS — Z951 Presence of aortocoronary bypass graft: Secondary | ICD-10-CM | POA: Insufficient documentation

## 2015-06-23 DIAGNOSIS — Z954 Presence of other heart-valve replacement: Secondary | ICD-10-CM | POA: Insufficient documentation

## 2015-06-23 DIAGNOSIS — G931 Anoxic brain damage, not elsewhere classified: Secondary | ICD-10-CM | POA: Diagnosis not present

## 2015-06-23 DIAGNOSIS — Z794 Long term (current) use of insulin: Secondary | ICD-10-CM | POA: Diagnosis not present

## 2015-06-23 DIAGNOSIS — Z87442 Personal history of urinary calculi: Secondary | ICD-10-CM | POA: Diagnosis not present

## 2015-06-23 DIAGNOSIS — Z8719 Personal history of other diseases of the digestive system: Secondary | ICD-10-CM | POA: Insufficient documentation

## 2015-06-23 DIAGNOSIS — F419 Anxiety disorder, unspecified: Secondary | ICD-10-CM | POA: Diagnosis not present

## 2015-06-23 DIAGNOSIS — F0280 Dementia in other diseases classified elsewhere without behavioral disturbance: Secondary | ICD-10-CM | POA: Insufficient documentation

## 2015-06-23 DIAGNOSIS — I10 Essential (primary) hypertension: Secondary | ICD-10-CM | POA: Insufficient documentation

## 2015-06-23 DIAGNOSIS — Y9389 Activity, other specified: Secondary | ICD-10-CM | POA: Insufficient documentation

## 2015-06-23 DIAGNOSIS — F329 Major depressive disorder, single episode, unspecified: Secondary | ICD-10-CM | POA: Diagnosis not present

## 2015-06-23 DIAGNOSIS — W5501XA Bitten by cat, initial encounter: Secondary | ICD-10-CM | POA: Diagnosis not present

## 2015-06-23 DIAGNOSIS — Z7901 Long term (current) use of anticoagulants: Secondary | ICD-10-CM | POA: Insufficient documentation

## 2015-06-23 DIAGNOSIS — Y998 Other external cause status: Secondary | ICD-10-CM | POA: Insufficient documentation

## 2015-06-23 DIAGNOSIS — Z7951 Long term (current) use of inhaled steroids: Secondary | ICD-10-CM | POA: Insufficient documentation

## 2015-06-23 DIAGNOSIS — Z8739 Personal history of other diseases of the musculoskeletal system and connective tissue: Secondary | ICD-10-CM | POA: Insufficient documentation

## 2015-06-23 DIAGNOSIS — I4892 Unspecified atrial flutter: Secondary | ICD-10-CM | POA: Diagnosis not present

## 2015-06-23 DIAGNOSIS — S61411A Laceration without foreign body of right hand, initial encounter: Secondary | ICD-10-CM | POA: Insufficient documentation

## 2015-06-23 DIAGNOSIS — Z87891 Personal history of nicotine dependence: Secondary | ICD-10-CM | POA: Insufficient documentation

## 2015-06-23 DIAGNOSIS — Z79899 Other long term (current) drug therapy: Secondary | ICD-10-CM | POA: Diagnosis not present

## 2015-06-23 DIAGNOSIS — S61451A Open bite of right hand, initial encounter: Secondary | ICD-10-CM

## 2015-06-23 DIAGNOSIS — I2510 Atherosclerotic heart disease of native coronary artery without angina pectoris: Secondary | ICD-10-CM | POA: Diagnosis not present

## 2015-06-23 MED ORDER — AMOXICILLIN-POT CLAVULANATE 875-125 MG PO TABS: 1.0000 | ORAL_TABLET | Freq: Two times a day (BID) | ORAL | Status: DC

## 2015-06-23 MED ORDER — TETANUS-DIPHTH-ACELL PERTUSSIS 5-2.5-18.5 LF-MCG/0.5 IM SUSP
0.5000 mL | Freq: Once | INTRAMUSCULAR | Status: AC
  Administered 2015-06-23: 0.5 mL via INTRAMUSCULAR
  Filled 2015-06-23: qty 0.5

## 2015-06-23 MED ORDER — AMOXICILLIN-POT CLAVULANATE 875-125 MG PO TABS
1.0000 | ORAL_TABLET | Freq: Once | ORAL | Status: AC
  Administered 2015-06-23: 1 via ORAL
  Filled 2015-06-23: qty 1

## 2015-06-23 NOTE — ED Provider Notes (Signed)
CSN: [id number]     Arrival date & time 06/23/15  7829 History   First MD Initiated Contact with Patient 06/23/15 [phone]     Chief Complaint  Patient presents with  . Animal Bite     (Consider location/radiation/quality/duration/timing/severity/associated sxs/prior Treatment) HPI  81 year old male presents with a cat bite to the right hand. Occurred about 3 hours ago. He states he thinks It because he was waking him up. Patient's cat is up-to-date on shots. Patient describes moderate pain, mostly due to swelling. Patient is on Coumadin and had initial trouble controlling the bleeding but now bleeding is controlled. Unsure of when his last tetanus shot was. No other injuries.  Past Medical History  Diagnosis Date  . Hypertension   . Hyperlipidemia   . Type II or unspecified type diabetes mellitus without mention of complication, not stated as uncontrolled   . Nephrolithiasis   . Atrial flutter [facility])     s/p CTI ablation by Dr Ladona Ridgel 1/12  . CAD (coronary artery disease)     s/p CABG x 5 2003 by Dr Laneta Simmers  . Aortic stenosis, moderate     moderate to severe per echo 03/2012 with AVR in March of 2014  . DJD (degenerative joint disease)   . DDD (degenerative disc disease)   . Spinal stenosis of lumbar region   . Bifascicular block   . Atrial tachycardia (HCC)   . Anxiety   . Depression   . S/P AVR (aortic valve replacement)   . OSA (obstructive sleep apnea)   . 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 Diverticulosis   . Hemorrhoids    Past Surgical History  Procedure Laterality Date  . Cabg  2003    by Dr Laneta Simmers  . Anal fissure repair    . Leg surgery      right  . Trigger finger release      bilat.  . Tonsillectomy    . Back surgery  2010  . Atrial ablation surgery  1/12    atrial flutter ablation by Dr Ladona Ridgel  . Cardioversion  04/07/2012    Procedure: CARDIOVERSION;  Surgeon: Wendall Stade, MD;  Location: [facility] ENDOSCOPY;  Service: Cardiovascular;   Laterality: N/A;  . Coronary artery bypass graft      10 yrs ago   03  . Aortic valve replacement N/A 09/01/2012    Procedure: AORTIC VALVE REPLACEMENT (AVR);  Surgeon: Alleen Borne, MD;  Location: [facility] OR;  Service: Open Heart Surgery;  Laterality: N/A;  . Intraoperative transesophageal echocardiogram N/A 09/01/2012    Procedure: INTRAOPERATIVE TRANSESOPHAGEAL ECHOCARDIOGRAM;  Surgeon: Alleen Borne, MD;  Location: [facility] OR;  Service: Open Heart Surgery;  Laterality: N/A;   Family History  Problem Relation Age of Onset  . Heart disease Father   . Heart disease Paternal Grandfather   . Stroke Mother   . Rheum arthritis Mother   . Colon cancer Neg Hx   . Stomach cancer Neg Hx    Social History  Substance Use Topics  . Smoking status: Former Smoker -- 1.00 packs/day for 49 years    Quit date: 08/21/2001  . Smokeless tobacco: Never Used     Comment: started at age 6.   [name] Alcohol Use: No     Comment: quit 15 yrs ago    Review of Systems  Musculoskeletal: Positive for joint swelling.  Skin: Positive for wound.  Neurological: Negative for weakness and numbness.  All other systems reviewed and are negative.  Allergies  Doxycycline  Home Medications   Prior to Admission medications   Medication Sig Start Date End Date Taking? Authorizing Provider  albuterol (PROVENTIL HFA;VENTOLIN HFA) 108 (90 BASE) MCG/ACT inhaler Inhale 2 puffs into the lungs every 6 (six) hours as needed for wheezing. 08/09/13   Barbaraann ShareKeith M Clance, MD  aspirin EC 325 MG EC tablet Take 1 tablet (325 mg total) by mouth daily. 09/06/12   Donielle Margaretann LovelessM Zimmerman, PA-C  budesonide-formoterol (SYMBICORT) 160-4.5 MCG/ACT inhaler Inhale 2 puffs into the lungs 2 (two) times daily. Patient taking differently: Inhale 2 puffs into the lungs daily.  03/08/14   Barbaraann ShareKeith M Clance, MD  cilostazol (PLETAL) 50 MG tablet Take 50 mg by mouth 2 (two) times daily.    Historical Provider, MD  fluticasone (FLONASE) 50 MCG/ACT nasal spray Place  1-2 sprays into both nostrils daily.  05/01/13   Historical Provider, MD  insulin glargine (LANTUS) 100 UNIT/ML injection Inject 20 Units into the skin at bedtime.     Historical Provider, MD  insulin lispro (HUMALOG) 100 UNIT/ML injection Inject 5 Units into the skin as needed for high blood sugar.    Historical Provider, MD  losartan-hydrochlorothiazide Mauri Reading(HYZAAR) 100-12.5 MG per tablet Once daily 07/25/14   Historical Provider, MD  Meclizine HCl (ANTIVERT PO) Take by mouth as directed.    Historical Provider, MD  memantine (NAMENDA) 10 MG tablet Take 1 tablet (10 mg total) by mouth 2 (two) times daily. 04/11/15   Porfirio Mylararmen Dohmeier, MD  metFORMIN (GLUCOPHAGE-XR) 500 MG 24 hr tablet Take 1,000 mg by mouth at bedtime.      Historical Provider, MD  metoprolol (LOPRESSOR) 50 MG tablet Take 1 tablet (50 mg total) by mouth 2 (two) times daily. 02/27/14   Hillis RangeJames Allred, MD  NITROSTAT 0.4 MG SL tablet as needed. 12/04/13   Historical Provider, MD  NON FORMULARY Place 2 L into the nose at bedtime.    Historical Provider, MD  Tamsulosin HCl (FLOMAX) 0.4 MG CAPS Take 0.4 mg by mouth at bedtime.  03/23/11   Historical Provider, MD  venlafaxine (EFFEXOR) 75 MG tablet Take 150mg  in the morning and 75mg  at bedtime.    Historical Provider, MD  warfarin (COUMADIN) 5 MG tablet Take as directed by coumadin clinic Patient taking differently: Take as directed by coumadin clinic Currently takes 5mg  three times a week and 7.5mg  the other days 09/03/14   Hillis RangeJames Allred, MD  zolpidem (AMBIEN) 5 MG tablet Take 5 mg by mouth at bedtime. 11/30/12   Historical Provider, MD   BP 146/68 mmHg  Pulse 114  Temp(Src) 97.7 F (36.5 C) (Oral)  Resp 24  Ht 5\' 5"  (1.651 m)  Wt 180 lb (81.647 kg)  BMI 29.95 kg/m2  SpO2 91% Physical Exam  Constitutional: He is oriented to person, place, and time. He appears well-developed and well-nourished.  HENT:  Head: Normocephalic and atraumatic.  Right Ear: External ear normal.  Left Ear: External  ear normal.  Nose: Nose normal.  Eyes: Right eye exhibits no discharge. Left eye exhibits no discharge.  Neck: Neck supple.  Cardiovascular: Intact distal pulses.   Pulses:      Radial pulses are 2+ on the right side, and 2+ on the left side.  Pulmonary/Chest: Effort normal and breath sounds normal.  Abdominal: Soft. There is no tenderness.  Musculoskeletal: He exhibits no edema.       Right hand: He exhibits laceration and swelling. He exhibits normal range of motion and no tenderness.  Hands: Neurological: He is alert and oriented to person, place, and time.  Skin: Skin is warm and dry.  Nursing note and vitals reviewed.   ED Course  Procedures (including critical care time) Labs Review Labs Reviewed - No data to display  Imaging Review No results found. I have personally reviewed and evaluated these images and lab results as part of my medical decision-making.   EKG Interpretation None      MDM   Final diagnoses:  Cat bite of right hand, initial encounter    Patient has multiple small linear lacerations, likely from where the cat pulled while biting. The cat's shots are up-to-date and can be observed in the house. Lacerations are small and given this was a cat bite do not feel these need to be repaired. Has some pain from swelling that is more likely due to being on warfarin. No bony tenderness and patient declines x-ray. Patient's wounds were irrigated in the emergency department, tetanus was updated, and he will be placed on Augmentin for prophylaxis for the cat bite. Discussed strict return precautions and have recommended he follow-up with his PCP in 48 hours for a wound check.    Pricilla Loveless, MD 06/23/15 [phone]

## 2015-06-23 NOTE — ED Notes (Signed)
Cat bite to R hand around 6am. It was his cat that is up to date on its shots

## 2015-06-26 ENCOUNTER — Ambulatory Visit (INDEPENDENT_AMBULATORY_CARE_PROVIDER_SITE_OTHER): Payer: Commercial Managed Care - HMO | Admitting: Cardiology

## 2015-06-26 DIAGNOSIS — I350 Nonrheumatic aortic (valve) stenosis: Secondary | ICD-10-CM

## 2015-06-26 DIAGNOSIS — I4891 Unspecified atrial fibrillation: Secondary | ICD-10-CM

## 2015-06-26 DIAGNOSIS — Z5181 Encounter for therapeutic drug level monitoring: Secondary | ICD-10-CM

## 2015-06-26 LAB — POCT INR: INR: 1.9

## 2015-07-30 ENCOUNTER — Ambulatory Visit (INDEPENDENT_AMBULATORY_CARE_PROVIDER_SITE_OTHER): Payer: Medicare Other

## 2015-07-30 DIAGNOSIS — I350 Nonrheumatic aortic (valve) stenosis: Secondary | ICD-10-CM

## 2015-07-30 DIAGNOSIS — I4891 Unspecified atrial fibrillation: Secondary | ICD-10-CM

## 2015-07-30 DIAGNOSIS — Z5181 Encounter for therapeutic drug level monitoring: Secondary | ICD-10-CM

## 2015-07-30 LAB — POCT INR: INR: 2.3

## 2015-07-30 MED ORDER — WARFARIN SODIUM 5 MG PO TABS: ORAL_TABLET | ORAL | Status: DC

## 2015-08-05 ENCOUNTER — Encounter (HOSPITAL_BASED_OUTPATIENT_CLINIC_OR_DEPARTMENT_OTHER): Payer: Self-pay | Admitting: *Deleted

## 2015-08-05 ENCOUNTER — Emergency Department (HOSPITAL_BASED_OUTPATIENT_CLINIC_OR_DEPARTMENT_OTHER)
Admission: EM | Admit: 2015-08-05 | Discharge: 2015-08-05 | Disposition: A | Discharge status: Home / Self Care | Payer: Medicare Other | Attending: Emergency Medicine | Admitting: Emergency Medicine

## 2015-08-05 ENCOUNTER — Emergency Department (HOSPITAL_BASED_OUTPATIENT_CLINIC_OR_DEPARTMENT_OTHER): Payer: Medicare Other

## 2015-08-05 DIAGNOSIS — I4892 Unspecified atrial flutter: Secondary | ICD-10-CM | POA: Insufficient documentation

## 2015-08-05 DIAGNOSIS — Z7951 Long term (current) use of inhaled steroids: Secondary | ICD-10-CM | POA: Diagnosis not present

## 2015-08-05 DIAGNOSIS — F329 Major depressive disorder, single episode, unspecified: Secondary | ICD-10-CM | POA: Insufficient documentation

## 2015-08-05 DIAGNOSIS — Y9389 Activity, other specified: Secondary | ICD-10-CM | POA: Diagnosis not present

## 2015-08-05 DIAGNOSIS — Z8669 Personal history of other diseases of the nervous system and sense organs: Secondary | ICD-10-CM | POA: Insufficient documentation

## 2015-08-05 DIAGNOSIS — Z8739 Personal history of other diseases of the musculoskeletal system and connective tissue: Secondary | ICD-10-CM | POA: Insufficient documentation

## 2015-08-05 DIAGNOSIS — Z954 Presence of other heart-valve replacement: Secondary | ICD-10-CM | POA: Diagnosis not present

## 2015-08-05 DIAGNOSIS — I2510 Atherosclerotic heart disease of native coronary artery without angina pectoris: Secondary | ICD-10-CM | POA: Insufficient documentation

## 2015-08-05 DIAGNOSIS — Z7901 Long term (current) use of anticoagulants: Secondary | ICD-10-CM | POA: Insufficient documentation

## 2015-08-05 DIAGNOSIS — Z951 Presence of aortocoronary bypass graft: Secondary | ICD-10-CM | POA: Insufficient documentation

## 2015-08-05 DIAGNOSIS — Z87891 Personal history of nicotine dependence: Secondary | ICD-10-CM | POA: Diagnosis not present

## 2015-08-05 DIAGNOSIS — Z87442 Personal history of urinary calculi: Secondary | ICD-10-CM | POA: Diagnosis not present

## 2015-08-05 DIAGNOSIS — Z794 Long term (current) use of insulin: Secondary | ICD-10-CM | POA: Diagnosis not present

## 2015-08-05 DIAGNOSIS — Z7982 Long term (current) use of aspirin: Secondary | ICD-10-CM | POA: Diagnosis not present

## 2015-08-05 DIAGNOSIS — I10 Essential (primary) hypertension: Secondary | ICD-10-CM | POA: Diagnosis not present

## 2015-08-05 DIAGNOSIS — Z8719 Personal history of other diseases of the digestive system: Secondary | ICD-10-CM | POA: Insufficient documentation

## 2015-08-05 DIAGNOSIS — R55 Syncope and collapse: Secondary | ICD-10-CM

## 2015-08-05 DIAGNOSIS — F419 Anxiety disorder, unspecified: Secondary | ICD-10-CM | POA: Diagnosis not present

## 2015-08-05 DIAGNOSIS — F0390 Unspecified dementia without behavioral disturbance: Secondary | ICD-10-CM | POA: Insufficient documentation

## 2015-08-05 DIAGNOSIS — S0990XA Unspecified injury of head, initial encounter: Secondary | ICD-10-CM | POA: Insufficient documentation

## 2015-08-05 DIAGNOSIS — E119 Type 2 diabetes mellitus without complications: Secondary | ICD-10-CM | POA: Diagnosis not present

## 2015-08-05 DIAGNOSIS — Z79899 Other long term (current) drug therapy: Secondary | ICD-10-CM | POA: Insufficient documentation

## 2015-08-05 DIAGNOSIS — S7001XA Contusion of right hip, initial encounter: Secondary | ICD-10-CM | POA: Insufficient documentation

## 2015-08-05 DIAGNOSIS — Y92511 Restaurant or cafe as the place of occurrence of the external cause: Secondary | ICD-10-CM | POA: Insufficient documentation

## 2015-08-05 DIAGNOSIS — W07XXXA Fall from chair, initial encounter: Secondary | ICD-10-CM | POA: Diagnosis not present

## 2015-08-05 DIAGNOSIS — Y998 Other external cause status: Secondary | ICD-10-CM | POA: Diagnosis not present

## 2015-08-05 LAB — COMPREHENSIVE METABOLIC PANEL
ALK PHOS: 77 U/L (ref 38–126)
ALT: 16 U/L — ABNORMAL LOW (ref 17–63)
ANION GAP: 12 (ref 5–15)
AST: 23 U/L (ref 15–41)
Albumin: 3.6 g/dL (ref 3.5–5.0)
BILIRUBIN TOTAL: 0.5 mg/dL (ref 0.3–1.2)
BUN: 20 mg/dL (ref 6–20)
CO2: 28 mmol/L (ref 22–32)
Calcium: 8.9 mg/dL (ref 8.9–10.3)
Chloride: 100 mmol/L — ABNORMAL LOW (ref 101–111)
Creatinine, Ser: 0.87 mg/dL (ref 0.61–1.24)
GFR calc non Af Amer: >60 mL/min (ref >60)
Glucose, Bld: 274 mg/dL — ABNORMAL HIGH (ref 65–99)
Potassium: 3.9 mmol/L (ref 3.5–5.1)
SODIUM: 140 mmol/L (ref 135–145)
TOTAL PROTEIN: 7.4 g/dL (ref 6.5–8.1)

## 2015-08-05 LAB — CBC WITH DIFFERENTIAL/PLATELET
Basophils Absolute: 0.1 10*3/uL (ref 0.0–0.1)
Basophils Relative: 1 %
Eosinophils Absolute: 0.3 10*3/uL (ref 0.0–0.7)
Eosinophils Relative: 4 %
HEMATOCRIT: 42.6 % (ref 39.0–52.0)
Hemoglobin: 13.9 g/dL (ref 13.0–17.0)
LYMPHS ABS: 1.2 10*3/uL (ref 0.7–4.0)
LYMPHS PCT: 18 %
MCH: 28.1 pg (ref 26.0–34.0)
MCHC: 32.6 g/dL (ref 30.0–36.0)
MCV: 86.1 fL (ref 78.0–100.0)
MONO ABS: 0.4 10*3/uL (ref 0.1–1.0)
Monocytes Relative: 6 %
NEUTROS ABS: 4.5 10*3/uL (ref 1.7–7.7)
Neutrophils Relative %: 71 %
Platelets: 308 10*3/uL (ref 150–400)
RBC: 4.95 MIL/uL (ref 4.22–5.81)
RDW: 14.1 % (ref 11.5–15.5)
WBC: 6.5 10*3/uL (ref 4.0–10.5)

## 2015-08-05 LAB — PROTIME-INR
INR: 2.04 — ABNORMAL HIGH (ref 0.00–1.49)
Prothrombin Time: 22.9 seconds — ABNORMAL HIGH (ref 11.6–15.2)

## 2015-08-05 NOTE — ED Provider Notes (Signed)
CSN: [id number]     Arrival date & time 08/05/15  1508 History  By signing my name below, I, Linus Galas, attest that this documentation has been prepared under the direction and in the presence of Adam Lyons, MD. Electronically Signed: Linus Galas, ED Scribe. 08/05/2015. 4:18 PM.   Chief Complaint  Patient presents with  . Loss of Consciousness   The history is provided by the patient and the spouse. No language interpreter was used.   HPI Comments: Adam Passe, Adam [name] here via EMS is a 81 y.o. male with a PMHx of HTN, HLD, DM, A-Fib, CAD, aortic stenosis, and a PSHx of aortic valve replacement and bypass who presents to the Emergency Department for an evaluation s/p syncopal episode, PTA. Since then, pt reports right leg pain. Pt states that he was by himself at a restaurant when he had a syncopal episode that he does not remember having. He state he currently "feels fine" while in the ED and felt "fine" when he woke up this morning. Pt denies being on any new medication or any dose changes. Pt denies any bowel or bladder incontinence, fever, changes in appetite or any other sx at this time. Pt is on Coumadin. Pt denies have a defibrillator.   Past Medical History  Diagnosis Date  . Hypertension   . Hyperlipidemia   . Type II or unspecified type diabetes mellitus without mention of complication, not stated as uncontrolled   . Nephrolithiasis   . Atrial flutter [facility])     s/p CTI ablation by Dr Ladona Ridgel 1/12  . CAD (coronary artery disease)     s/p CABG x 5 2003 by Dr Laneta Simmers  . Aortic stenosis, moderate     moderate to severe per echo 03/2012 with AVR in March of 2014  . DJD (degenerative joint disease)   . DDD (degenerative disc disease)   . Spinal stenosis of lumbar region   . Bifascicular block   . Atrial tachycardia (HCC)   . Anxiety   . Depression   . S/P AVR (aortic valve replacement)   . OSA (obstructive sleep apnea)   . Dementia following hypoxic-ischemic injury (HCC)  05/29/2013  . Diverticulosis   . Hemorrhoids    Past Surgical History  Procedure Laterality Date  . Cabg  2003    by Dr Laneta Simmers  . Anal fissure repair    . Leg surgery      right  . Trigger finger release      bilat.  . Tonsillectomy    . Back surgery  2010  . Atrial ablation surgery  1/12    atrial flutter ablation by Dr Ladona Ridgel  . Cardioversion  04/07/2012    Procedure: CARDIOVERSION;  Surgeon: Wendall Stade, MD;  Location: [facility] ENDOSCOPY;  Service: Cardiovascular;  Laterality: N/A;  . Coronary artery bypass graft      10 yrs ago   03  . Aortic valve replacement N/A 09/01/2012    Procedure: AORTIC VALVE REPLACEMENT (AVR);  Surgeon: Alleen Borne, MD;  Location: [facility] OR;  Service: Open Heart Surgery;  Laterality: N/A;  . Intraoperative transesophageal echocardiogram N/A 09/01/2012    Procedure: INTRAOPERATIVE TRANSESOPHAGEAL ECHOCARDIOGRAM;  Surgeon: Alleen Borne, MD;  Location: [facility] OR;  Service: Open Heart Surgery;  Laterality: N/A;   Family History  Problem Relation Age of Onset  . Heart disease Father   . Heart disease Paternal Grandfather   . Stroke Mother   . Rheum arthritis Mother   .  Colon cancer Neg Hx   . Stomach cancer Neg Hx    Social History  Substance Use Topics  . Smoking status: Former Smoker -- 1.00 packs/day for 49 years    Quit date: 08/21/2001  . Smokeless tobacco: Never Used     Comment: started at age 15.   [name] Alcohol Use: No     Comment: quit 15 yrs ago    Review of Systems  Constitutional: Negative for fever and appetite change.  Musculoskeletal:       +right leg pain  Neurological: Positive for syncope.  All other systems reviewed and are negative.  Allergies  Doxycycline  Home Medications   Prior to Admission medications   Medication Sig Start Date End Date Taking? Authorizing Provider  albuterol (PROVENTIL HFA;VENTOLIN HFA) 108 (90 BASE) MCG/ACT inhaler Inhale 2 puffs into the lungs every 6 (six) hours as needed for wheezing. 08/09/13    Barbaraann Share, MD  amoxicillin-clavulanate (AUGMENTIN) 875-125 MG tablet Take 1 tablet by mouth 2 (two) times daily. One po bid x 7 days 06/23/15   Pricilla Loveless, MD  aspirin EC 325 MG EC tablet Take 1 tablet (325 mg total) by mouth daily. 09/06/12   Donielle Margaretann Loveless, PA-C  budesonide-formoterol (SYMBICORT) 160-4.5 MCG/ACT inhaler Inhale 2 puffs into the lungs 2 (two) times daily. Patient taking differently: Inhale 2 puffs into the lungs daily.  03/08/14   Barbaraann Share, MD  cilostazol (PLETAL) 50 MG tablet Take 50 mg by mouth 2 (two) times daily.    Historical Provider, MD  fluticasone (FLONASE) 50 MCG/ACT nasal spray Place 1-2 sprays into both nostrils daily.  05/01/13   Historical Provider, MD  insulin glargine (LANTUS) 100 UNIT/ML injection Inject 20 Units into the skin at bedtime.     Historical Provider, MD  insulin lispro (HUMALOG) 100 UNIT/ML injection Inject 5 Units into the skin as needed for high blood sugar.    Historical Provider, MD  losartan-hydrochlorothiazide Mauri Reading) 100-12.5 MG per tablet Once daily 07/25/14   Historical Provider, MD  Meclizine HCl (ANTIVERT PO) Take by mouth as directed.    Historical Provider, MD  memantine (NAMENDA) 10 MG tablet Take 1 tablet (10 mg total) by mouth 2 (two) times daily. 04/11/15   Porfirio Mylar Dohmeier, MD  metFORMIN (GLUCOPHAGE-XR) 500 MG 24 hr tablet Take 1,000 mg by mouth at bedtime.      Historical Provider, MD  metoprolol (LOPRESSOR) 50 MG tablet Take 1 tablet (50 mg total) by mouth 2 (two) times daily. 02/27/14   [name], MD  NITROSTAT 0.4 MG SL tablet as needed. 12/04/13   Historical Provider, MD  NON FORMULARY Place 2 L into the nose at bedtime.    Historical Provider, MD  Tamsulosin HCl (FLOMAX) 0.4 MG CAPS Take 0.4 mg by mouth at bedtime.  03/23/11   Historical Provider, MD  venlafaxine (EFFEXOR) 75 MG tablet Take  in the morning and  at bedtime.    Historical Provider, MD  warfarin (COUMADIN) 5 MG tablet Take as directed by  coumadin clinic 07/30/15   [name], MD  zolpidem (AMBIEN) 5 MG tablet Take 5 mg by mouth at bedtime. 11/30/12   Historical Provider, MD   BP 147/66 mmHg  Pulse 88  Temp(Src) 97.6 F (36.4 C) (Oral)  Resp 24  Ht  (1.651 m)  Wt 180 lb (81.647 kg)  BMI 29.95 kg/m2  SpO2 98%   Physical Exam  Constitutional: He is oriented to person, place, and time. He  appears well-developed and well-nourished. No distress.  HENT:  Head: Normocephalic and atraumatic.  Mouth/Throat: Oropharynx is clear and moist. No oropharyngeal exudate.  Eyes: Conjunctivae and EOM are normal. Pupils are equal, round, and reactive to light.  Neck: Normal range of motion. Neck supple.  No meningismus.  Cardiovascular: Normal rate, regular rhythm, normal heart sounds and intact distal pulses.   No murmur heard. Pulmonary/Chest: Effort normal and breath sounds normal. No respiratory distress.  Abdominal: Soft. There is no tenderness. There is no rebound and no guarding.  Musculoskeletal: Normal range of motion. He exhibits no edema or tenderness.  Mild tenderness to the lateral right hip. No shortening or rotation of the right leg.   Neurological: He is alert and oriented to person, place, and time. No cranial nerve deficit. He exhibits normal muscle tone. Coordination normal.  No ataxia on finger to nose bilaterally. No pronator drift. 5/5 strength throughout. CN 2-12 intact.Equal grip strength. Sensation intact.   Skin: Skin is warm.  Psychiatric: He has a normal mood and affect. His behavior is normal.  Nursing note and vitals reviewed.   ED Course  Procedures   DIAGNOSTIC STUDIES: Oxygen Saturation is 96% on room air, normal by my interpretation.    COORDINATION OF CARE: 4:01 PM Will order blood work, EKG, CT head and right hip xray. Discussed treatment plan with pt at bedside and pt agreed to plan.  Labs Review Labs Reviewed  COMPREHENSIVE METABOLIC PANEL - Abnormal; Notable for the following:     Chloride 100 (*)    Glucose, Bld 274 (*)    ALT 16 (*)    All other components within normal limits  PROTIME-INR - Abnormal; Notable for the following:    Prothrombin Time 22.9 (*)    INR 2.04 (*)    All other components within normal limits  CBC WITH DIFFERENTIAL/PLATELET    Imaging Review Ct Head Wo Contrast  08/05/2015  CLINICAL DATA:  Syncope today, memory loss EXAM: CT HEAD WITHOUT CONTRAST TECHNIQUE: Contiguous axial images were obtained from the base of the skull through the vertex without intravenous contrast. COMPARISON:  11/27/2013 FINDINGS: Severe diffuse atrophy. Low attenuation in the periventricular white matter. No evidence of vascular territory infarct. No mass or hemorrhage. No extra-axial fluid. Calvarium intact. No significant inflammatory change in the visualized portions of the paranasal sinuses. IMPRESSION: Severe involutional change with no acute findings Electronically Signed   By: Esperanza Heir M.D.   On: 08/05/2015 16:47   Dg Hip Unilat With Pelvis 2-3 Views Right  08/05/2015  CLINICAL DATA:  Passed out and fell today injuring RIGHT hip, lateral pain, increased pain when raising RIGHT leg, initial encounter EXAM: DG HIP (WITH OR WITHOUT PELVIS) 2-3V RIGHT COMPARISON:  None FINDINGS: Mild diffuse osseous demineralization. Hip and SI joints symmetric and preserved. No acute fracture, dislocation or bone destruction. Scattered atherosclerotic calcifications and few pelvic phleboliths noted. IMPRESSION: No acute osseous abnormalities. Electronically Signed   By: Ulyses Southward M.D.   On: 08/05/2015 16:46   I have personally reviewed and evaluated these images and lab results as part of my medical decision-making.  ED ECG REPORT   Date: 08/05/2015  Rate: 90  Rhythm: normal sinus rhythm  QRS Axis: left  Intervals: normal  ST/T Wave abnormalities: nonspecific T wave changes  Conduction Disutrbances:right bundle branch block  Narrative Interpretation:   Old EKG  Reviewed: none available  I have personally reviewed the EKG tracing and agree with the computerized printout as noted.  MDM   Final diagnoses:  None    Patient is a 81 year old male with extensive past medical history including coronary artery disease with CABG, heart valve replacement, diabetes, and hypertension. He presents today by EMS for evaluation of syncope. He states that he went to lunch at a [employer] and experienced some sort of syncopal or unresponsive episode. He was sitting at the table when he lost consciousness and fell to the floor. The patient has no recollection of what happened and states that he felt fine before and after this episode. He was initially refusing transport, however EMS was adamant that he come to be evaluated.  Patient arrives here with no complaints and states that he feels fine. His workup reveals a normal head CT, bundle branch block on his EKG which appears old, and laboratory studies and blood counts which are unremarkable. I am uncertain as to what caused this syncopal episode, whether some sort of cardiac arrhythmia, seizure, or vasovagal episode. What I do know is that this patient is adamant that he not be admitted to the hospital. I explained to him that his extensive history puts him in a high risk category for cardiac arrhythmia or possible other life-threatening etiology. He remains adamant for discharge. I had this conversation and the presence of his wife who is also comfortable with this disposition. He will be discharged to home and has informed me he will follow up with both his cardiologist, Dr. Johney Frame, and his PCP, [name].  I personally performed the services described in this documentation, which was scribed in my presence. The recorded information has been reviewed and is accurate.       Adam Lyons, MD 08/05/15 [phone]

## 2015-08-05 NOTE — Discharge Instructions (Signed)
Return to the Emergency Department if symptoms significantly worsen or change.   Syncope Syncope is a medical term for fainting or passing out. This means you lose consciousness and drop to the ground. People are generally unconscious for less than 5 minutes. You may have some muscle twitches for up to 15 seconds before waking up and returning to normal. Syncope occurs more often in older adults, but it can happen to anyone. While most causes of syncope are not dangerous, syncope can be a sign of a serious medical problem. It is important to seek medical care.  CAUSES  Syncope is caused by a sudden drop in blood flow to the brain. The specific cause is often not determined. Factors that can bring on syncope include:  Taking medicines that lower blood pressure.  Sudden changes in posture, such as standing up quickly.  Taking more medicine than prescribed.  Standing in one place for too long.  Seizure disorders.  Dehydration and excessive exposure to heat.  Low blood sugar (hypoglycemia).  Straining to have a bowel movement.  Heart disease, irregular heartbeat, or other circulatory problems.  Fear, emotional distress, seeing blood, or severe pain. SYMPTOMS  Right before fainting, you may:  Feel dizzy or light-headed.  Feel nauseous.  See all white or all black in your field of vision.  Have cold, clammy skin. DIAGNOSIS  Your health care provider will ask about your symptoms, perform a physical exam, and perform an electrocardiogram (ECG) to record the electrical activity of your heart. Your health care provider may also perform other heart or blood tests to determine the cause of your syncope which may include:  Transthoracic echocardiogram (TTE). During echocardiography, sound waves are used to evaluate how blood flows through your heart.  Transesophageal echocardiogram (TEE).  Cardiac monitoring. This allows your health care provider to monitor your heart rate and rhythm  in real time.  Holter monitor. This is a portable device that records your heartbeat and can help diagnose heart arrhythmias. It allows your health care provider to track your heart activity for several days, if needed.  Stress tests by exercise or by giving medicine that makes the heart beat faster. TREATMENT  In most cases, no treatment is needed. Depending on the cause of your syncope, your health care provider may recommend changing or stopping some of your medicines. HOME CARE INSTRUCTIONS  Have someone stay with you until you feel stable.  Do not drive, use machinery, or play sports until your health care provider says it is okay.  Keep all follow-up appointments as directed by your health care provider.  Lie down right away if you start feeling like you might faint. Breathe deeply and steadily. Wait until all the symptoms have passed.  Drink enough fluids to keep your urine clear or pale yellow.  If you are taking blood pressure or heart medicine, get up slowly and take several minutes to sit and then stand. This can reduce dizziness. SEEK IMMEDIATE MEDICAL CARE IF:   You have a severe headache.  You have unusual pain in the chest, abdomen, or back.  You are bleeding from your mouth or rectum, or you have black or tarry stool.  You have an irregular or very fast heartbeat.  You have pain with breathing.  You have repeated fainting or seizure-like jerking during an episode.  You faint when sitting or lying down.  You have confusion.  You have trouble walking.  You have severe weakness.  You have vision problems. If  you fainted, call your local emergency services (911 in [place] Do not drive yourself to the hospital.    This information is not intended to replace advice given to you by your health care provider. Make sure you discuss any questions you have with your health care provider.   Document Released: 06/08/2005 Document Revised: 10/23/2014 Document Reviewed:  08/07/2011 Elsevier Interactive Patient Education [employer]

## 2015-08-05 NOTE — ED Notes (Signed)
EMS transport from [facility]- Per ems report: pt sitting and eating lunch, had syncopal episode and fell out of chair- pt unable to recall events

## 2015-08-05 NOTE — ED Notes (Signed)
Patient ambulatory to the restroom with minimal assistance steady gait. Urinated and back to the room without any distress or unsteady gait. The patient back to bed and placed on pulse ox and room air sats of 84 % - patient wears Home o2 at night only. Patient placed on 2 liters o2 - and o2 gradually increased up to 95 %. The patient denies any dizziness or lightheadedness.

## 2015-08-12 ENCOUNTER — Encounter: Payer: Self-pay | Admitting: Nurse Practitioner

## 2015-08-12 ENCOUNTER — Ambulatory Visit (INDEPENDENT_AMBULATORY_CARE_PROVIDER_SITE_OTHER): Payer: Medicare Other | Admitting: Nurse Practitioner

## 2015-08-12 VITALS — BP 146/62 | HR 104 | Ht 65.0 in | Wt 182.8 lb

## 2015-08-12 DIAGNOSIS — F0150 Vascular dementia without behavioral disturbance: Secondary | ICD-10-CM

## 2015-08-12 DIAGNOSIS — R413 Other amnesia: Secondary | ICD-10-CM | POA: Diagnosis not present

## 2015-08-12 DIAGNOSIS — G4710 Hypersomnia, unspecified: Secondary | ICD-10-CM

## 2015-08-12 DIAGNOSIS — G4730 Sleep apnea, unspecified: Secondary | ICD-10-CM | POA: Diagnosis not present

## 2015-08-12 NOTE — Patient Instructions (Signed)
Continue Namenda 10 mg twice daily Try to exercise memory by doing strategy games Follow-up in 6 months

## 2015-08-12 NOTE — Progress Notes (Signed)
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 He has a history of vascular dementia. He is currently on Namenda 10 twice daily having failed Aricept and Exelon in the past. Wife reports he is not motivated to do much, he used to read a lot but has no interest in that. He missed 2 of 3 recall questions and scored 27 out of 30 on Mini-Mental status exam. He had a recent fall and is due to have an outpatient echocardiogram and carotid Doppler. He has a history of obstructive sleep apnea but no longer uses CPAP. 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 He was last seen in this office by [name] in March 2016. [name] He has a history of mild , vascular dementia. last visits MMSE 27/30. He missed all 3 recall questions .AFT 10.Clock drawing 1/4. Speech is fluent without Dysarthria, but with dysphonia . Mood and affect are appropriate.   He also has a history of obstructive sleep apnea however he does not longer use his CPAP. CT of the head June 2015 with Moderate atrophy and chronic microvascular ischemic change,stable from the prior brain MRI.  He is currently on Namenda 10 mg twice daily for his memory and Aricept was added 2015 - however he stopped the medication about 3 weeks later after being continuously nauseated and vomiting.  He has been on the Exelon patch in the past and had side effects to that.  Last MMSE today is 27 out of 30.  He has no hobbies, he does not exercise He returns for reevaluation, lives with his wife, who is today not with him. Fatigue severity score was endorsed at 33 points an Epworth sleepiness score at 5 points, a mini mental status  examination was performed at 22 points, and a Montral cognitive assessment wa aborted after the patient was unable to perform a trail making test, he drew a clock face but he did not place the fingers of the clock correctly. He draw a cube, name 3 animals but did not perform well on attention language and obstruction. He could not recall any of the 5 recall words but was fully oriented to date time and place.   Dr. Eulah [name] reports that he has not felt palpitations or irregular heartbeats but he has in the past often not known when he had atrial fibrillation. 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 As to Kathan was last seen in June by [name] our nurse practitioner. He has been followed for memory difficulties and had also undergone a spinal tap to rule out normal pressure hydrocephalus. He performed today very well on the Mini-Mental Status Examination at 26 out of 30 points his main problem is a short term recall of 3 words but he is visually spatially doing very well and could answer all orientation questions. He was further tested with a M all CEA Montral cognitive assessment test did well on a copy of a three-dimensional cube again the clock face was normal he had some difficulties with the trail making part. He  could name 2 out of 3 animals correctly ditzy subtraction serial sevens did well with language but had some trouble generating words with a certain R at the beginning. And again his main difficulties were now 5 word recall. He has difficulties reading, as he forgets the beginning information a he continues to follow the story. The same with movies.  The diagnosis is still mild dementia , not cognitive amnestic impairment .    REVIEW OF SYSTEMS: Full 14 system review of systems performed and  notable only for those listed, all others are neg:  Constitutional: neg  Cardiovascular: neg Ear/Nose/Throat: neg  Skin: neg Eyes: neg Respiratory: neg Gastroitestinal: Urinary frequency  Hematology/Lymphatic: Easy bruising  Endocrine: neg Musculoskeletal:neg Allergy/Immunology: neg Neurological: Memory loss Psychiatric: Decreased concentration Sleep : neg   ALLERGIES: Allergies  Allergen Reactions  . Doxycycline Swelling    swollen tongue, anaphylaxis     HOME MEDICATIONS: Outpatient Prescriptions Prior to Visit  Medication Sig Dispense Refill  . albuterol (PROVENTIL HFA;VENTOLIN HFA) 108 (90 BASE) MCG/ACT inhaler Inhale 2 puffs into the lungs every 6 (six) hours as needed for wheezing. 1 Inhaler 6  . aspirin EC 325 MG EC tablet Take 1 tablet (325 mg total) by mouth daily. 30 tablet   . budesonide-formoterol (SYMBICORT) 160-4.5 MCG/ACT inhaler Inhale 2 puffs into the lungs 2 (two) times daily. (Patient taking differently: Inhale 2 puffs into the lungs daily. ) 1 Inhaler 5  . cilostazol (PLETAL) 50 MG tablet Take 50 mg by mouth 2 (two) times daily.    . fluticasone (FLONASE) 50 MCG/ACT nasal spray Place 1-2 sprays into both nostrils daily.     . insulin glargine (LANTUS) 100 UNIT/ML injection Inject 20 Units into the skin at bedtime.     . insulin lispro (HUMALOG) 100 UNIT/ML injection Inject 5 Units into the skin as needed for high blood sugar.    . losartan-hydrochlorothiazide (HYZAAR) 100-12.5 MG per tablet Once daily    . memantine (NAMENDA) 10 MG tablet Take 1 tablet (10 mg total) by mouth 2 (two) times daily. 180 tablet 1  . metFORMIN (GLUCOPHAGE-XR) 500 MG 24 hr tablet Take 1,000 mg by mouth at bedtime.      . metoprolol (LOPRESSOR) 50 MG tablet Take 1 tablet (50 mg total) by mouth 2 (two) times daily. 180 tablet 1  . NITROSTAT 0.4 MG SL tablet as needed.    . NON FORMULARY Place 2 L into the nose at bedtime.    . Tamsulosin HCl (FLOMAX) 0.4 MG CAPS Take 0.4 mg by  mouth at bedtime.     [name] venlafaxine (EFFEXOR) 75 MG tablet Take  in the morning and  at bedtime.    [name] warfarin (COUMADIN) 5 MG tablet Take as directed by coumadin clinic 150 tablet 1  . zolpidem (AMBIEN) 5 MG tablet Take 5 mg by mouth at bedtime.    . Meclizine HCl (ANTIVERT PO) Take by mouth as directed.    [name] amoxicillin-clavulanate (AUGMENTIN) 875-125 MG tablet Take 1 tablet by mouth 2 (two) times daily. One po bid x 7 days 14 tablet 0   No facility-administered medications prior to visit.    PAST MEDICAL HISTORY: Past Medical History  Diagnosis Date  . Hypertension   . Hyperlipidemia   . Type II or unspecified type diabetes mellitus without mention of complication, not stated as uncontrolled   . Nephrolithiasis   . Atrial flutter [facility])     s/p CTI ablation by Dr Ladona Ridgel 1/12  . CAD (  coronary artery disease)     s/p CABG x 5 2003 by Dr Laneta Simmers  . Aortic stenosis, moderate     moderate to severe per echo 03/2012 with AVR in March of 2014  . DJD (degenerative joint disease)   . DDD (degenerative disc disease)   . Spinal stenosis of lumbar region   . Bifascicular block   . Atrial tachycardia (HCC)   . Anxiety   . Depression   . S/P AVR (aortic valve replacement)   . OSA (obstructive sleep apnea)   . 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 Diverticulosis   . Hemorrhoids     PAST SURGICAL HISTORY: Past Surgical History  Procedure Laterality Date  . Cabg  2003    by Dr Laneta Simmers  . Anal fissure repair    . Leg surgery      right  . Trigger finger release      bilat.  . Tonsillectomy    . Back surgery  2010  . Atrial ablation surgery  1/12    atrial flutter ablation by Dr Ladona Ridgel  . Cardioversion  04/07/2012    Procedure: CARDIOVERSION;  Surgeon: Wendall Stade, MD;  Location: [facility] ENDOSCOPY;  Service: Cardiovascular;  Laterality: N/A;  . Coronary artery bypass graft      10 yrs ago   03  . Aortic valve replacement N/A 09/01/2012    Procedure:  AORTIC VALVE REPLACEMENT (AVR);  Surgeon: Alleen Borne, MD;  Location: [facility] OR;  Service: Open Heart Surgery;  Laterality: N/A;  . Intraoperative transesophageal echocardiogram N/A 09/01/2012    Procedure: INTRAOPERATIVE TRANSESOPHAGEAL ECHOCARDIOGRAM;  Surgeon: Alleen Borne, MD;  Location: [facility] OR;  Service: Open Heart Surgery;  Laterality: N/A;    FAMILY HISTORY: Family History  Problem Relation Age of Onset  . Heart disease Father   . Heart disease Paternal Grandfather   . Stroke Mother   . Rheum arthritis Mother   . Colon cancer Neg Hx   . Stomach cancer Neg Hx     SOCIAL HISTORY: Social History   Social History  . Marital Status: Married    Spouse Name: Lynnette  . Number of Children: 5  . Years of Education: college   Occupational History  . pharmacist     retired   Social History Main Topics  . Smoking status: Former Smoker -- 1.00 packs/day for 49 years    Quit date: 08/21/2001  . Smokeless tobacco: Never Used     Comment: started at age 40.   [name] Alcohol Use: No     Comment: quit 15 yrs ago  . Drug Use: No  . Sexual Activity: No   Other Topics Concern  . 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   Patient has two children and his wife has three children.   Patient has a college education.   Patient is right- handed.   Patient does not drink any caffeine.    He is a retired Teacher, early years/pre.     PHYSICAL EXAM  Filed Vitals:   08/12/15 1008  Height:  (1.651 m)  Weight: 182 lb 12.8 oz (82.918 kg)   Body mass index is 30.42 kg/(m^2). General: The patient is awake, alert and appears not in acute distress. The patient is well groomed.  Head: Normocephalic, atraumatic.  Neck is supple.  Cardiovascular: Regular rate and rhythm ,  Respiratory: Lungs are clear to auscultation.  Skin The back of either hand has been bruised   Neurologic exam:   Memory MMSE 27/30 missing 2 of 3 recall ,  and the physician's name. AFT 8. Clock drawing 3/4.  Cranial nerves: Pupils are equal and briskly reactive to light. Extraocular movements in vertical and horizontal planes intact and without nystagmus. Visual fields by finger perimetry are intact.  Hearing to finger rub intact. Facial sensation intact to fine touch. Facial motor strength is symmetric and tongue move midline.  Motor exam: Increased rigidity, tone,and delayed response to motor commands. No focal weakness. Sensory: Fine touch, pinprick and vibration were intact  Coordination: Rapid alternating movements in the fingers/hands were  exact, the patient provided a good grip strengthFinger-to-nose performed without evidence of  ataxia, dysmetria or tremor.  Gait and station: Patient walks without assistive device. Slow ambulation, bend forwards, wide-based and slow. Tandem gait is unsteady Turned with 3 steps.  Deep tendon reflexes: in the upper and lower extremities are symmetric and intact. Babinski maneuver response is downgoing.   DIAGNOSTIC DATA (LABS, IMAGING, TESTING) - I reviewed patient records, labs, notes, testing and imaging myself where available.  Lab Results  Component Value Date   WBC 6.5 08/05/2015   HGB 13.9 08/05/2015   HCT 42.6 08/05/2015   MCV 86.1 08/05/2015   PLT 308 08/05/2015      Component Value Date/Time   NA 140 08/05/2015 1605   K 3.9 08/05/2015 1605   CL 100* 08/05/2015 1605   CO2 28 08/05/2015 1605   GLUCOSE 274* 08/05/2015 1605   BUN 20 08/05/2015 1605   CREATININE 0.87 08/05/2015 1605   CALCIUM 8.9 08/05/2015 1605   PROT 7.4 08/05/2015 1605   ALBUMIN 3.6 08/05/2015 1605   AST 23 08/05/2015 1605   ALT 16* 08/05/2015 1605   ALKPHOS 77 08/05/2015 1605   BILITOT 0.5 08/05/2015 1605   GFRNONAA >60 08/05/2015 1605   GFRAA >60 08/05/2015 1605   ASSESSMENT AND PLAN 81 y.o. year old male has a past medical history of vascular dementia. CT of the head June 2015 with Moderate  atrophy and chronic microvascular ischemic change,stable from the prior brain MRI. NPH has been ruled out after lumbar puncture.Has had side effects to Aricept and Exelon. Currently treated on Namenda only his memory loss MMSE 27/30.   Continue Namenda twice daily for memory. Due to his recent fall is not driving, cardiac workup in progress. ER records reviewed Follow-up in 6 months.  Nilda Riggs, [facility], [facility], APRN  [facility] [address] [place], [place] [address] [phone]

## 2015-08-13 NOTE — Progress Notes (Signed)
I agree with the assessment and plan as directed by NP .The patient is known to me .   Zia Kanner, MD  

## 2015-08-14 ENCOUNTER — Ambulatory Visit (INDEPENDENT_AMBULATORY_CARE_PROVIDER_SITE_OTHER): Payer: Medicare Other | Admitting: Internal Medicine

## 2015-08-14 VITALS — BP 142/56 | HR 80 | Ht 65.0 in | Wt 183.8 lb

## 2015-08-14 DIAGNOSIS — I10 Essential (primary) hypertension: Secondary | ICD-10-CM

## 2015-08-14 DIAGNOSIS — I25810 Atherosclerosis of coronary artery bypass graft(s) without angina pectoris: Secondary | ICD-10-CM

## 2015-08-14 DIAGNOSIS — I4891 Unspecified atrial fibrillation: Secondary | ICD-10-CM

## 2015-08-14 DIAGNOSIS — I452 Bifascicular block: Secondary | ICD-10-CM

## 2015-08-14 DIAGNOSIS — R55 Syncope and collapse: Secondary | ICD-10-CM

## 2015-08-14 DIAGNOSIS — I459 Conduction disorder, unspecified: Secondary | ICD-10-CM

## 2015-08-14 MED ORDER — LOSARTAN POTASSIUM 100 MG PO TABS: 100.0000 mg | ORAL_TABLET | Freq: Every day | ORAL | Status: AC

## 2015-08-14 NOTE — Patient Instructions (Signed)
Medication Instructions:  Your physician has recommended you make the following change in your medication:  1) Stop Losartan/HCTZ 2) Start Losartan   3) Decrease Metoprolol to  twice daily for 1 week then stop   Labwork: None ordered   Testing/Procedures: Your physician has recommended that you wear an event monitor. Event monitors are medical devices that record the heart's electrical activity. Doctors most often [place] these monitors to diagnose arrhythmias. Arrhythmias are problems with the speed or rhythm of the heartbeat. The monitor is a small, portable device. You can wear one while you do your normal daily activities. This is usually used to diagnose what is causing palpitations/syncope (passing out).--[facility]  Your physician has requested that you have an echocardiogram. Echocardiography is a painless test that uses sound waves to create images of your heart. It provides your doctor with information about the size and shape of your heart and how well your heart's chambers and valves are working. This procedure takes approximately one hour. There are no restrictions for this procedure.      Follow-Up: Your physician recommends that you schedule a follow-up appointment in: 6 weeks with Dr Johney Frame   Any Other Special Instructions Will Be Listed Below (If Applicable).  NO DRIVING for 6 months      If you need a refill on your cardiac medications before your next appointment, please call your pharmacy.

## 2015-08-14 NOTE — Progress Notes (Signed)
PCP:  Garlan Fillers, MD  The patient presents today for routine electrophysiology followup after a recent episode of syncope.  He reports that on 08/05/15, he was seated and having lunch when he had abrupt loss of consciousness with collapse.  The episode occurred without warning and lasted a brief period of time.  He did not have incontinence or any features of seizure.  He was evaluated at [facility] ED and discharged to follow-up with me.  He has done well since.    Today, he denies symptoms of palpitations, chest pain, orthopnea, PND, lower extremity edema, dizziness, or neurologic sequela.  The patient feels that he is tolerating medications without difficulties and is otherwise without complaint today.   Past Medical History  Diagnosis Date  . Hypertension   . Hyperlipidemia   . Type II or unspecified type diabetes mellitus without mention of complication, not stated as uncontrolled   . Nephrolithiasis   . Atrial flutter [facility])     s/p CTI ablation by Dr Ladona Ridgel 1/12  . CAD (coronary artery disease)     s/p CABG x 5 2003 by Dr Laneta Simmers  . Aortic stenosis, moderate     moderate to severe per echo 03/2012 with AVR in March of 2014  . DJD (degenerative joint disease)   . DDD (degenerative disc disease)   . Spinal stenosis of lumbar region   . Bifascicular block   . Atrial tachycardia (HCC)   . Anxiety   . Depression   . S/P AVR (aortic valve replacement)   . OSA (obstructive sleep apnea)   . 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 Diverticulosis   . Hemorrhoids    Past Surgical History  Procedure Laterality Date  . Cabg  2003    by Dr Laneta Simmers  . Anal fissure repair    . Leg surgery      right  . Trigger finger release      bilat.  . Tonsillectomy    . Back surgery  2010  . Atrial ablation surgery  1/12    atrial flutter ablation by Dr Ladona Ridgel  . Cardioversion  04/07/2012    Procedure: CARDIOVERSION;  Surgeon: Wendall Stade, MD;  Location: [facility]  ENDOSCOPY;  Service: Cardiovascular;  Laterality: N/A;  . Coronary artery bypass graft      10 yrs ago   03  . Aortic valve replacement N/A 09/01/2012    Procedure: AORTIC VALVE REPLACEMENT (AVR);  Surgeon: Alleen Borne, MD;  Location: [facility] OR;  Service: Open Heart Surgery;  Laterality: N/A;  . Intraoperative transesophageal echocardiogram N/A 09/01/2012    Procedure: INTRAOPERATIVE TRANSESOPHAGEAL ECHOCARDIOGRAM;  Surgeon: Alleen Borne, MD;  Location: [facility] OR;  Service: Open Heart Surgery;  Laterality: N/A;    Current Outpatient Prescriptions  Medication Sig Dispense Refill  . albuterol (PROVENTIL HFA;VENTOLIN HFA) 108 (90 BASE) MCG/ACT inhaler Inhale 2 puffs into the lungs every 6 (six) hours as needed for wheezing. 1 Inhaler 6  . aspirin EC 325 MG EC tablet Take 1 tablet (325 mg total) by mouth daily. 30 tablet   . budesonide-formoterol (SYMBICORT) 160-4.5 MCG/ACT inhaler Inhale 2 puffs into the lungs 2 (two) times daily. (Patient taking differently: Inhale 2 puffs into the lungs daily. ) 1 Inhaler 5  . cilostazol (PLETAL) 50 MG tablet Take 50 mg by mouth 2 (two) times daily.    . fluticasone (FLONASE) 50 MCG/ACT nasal spray Place 1-2 sprays into both nostrils daily.     . insulin  glargine (LANTUS) 100 UNIT/ML injection Inject 20 Units into the skin at bedtime.     . insulin lispro (HUMALOG) 100 UNIT/ML injection Inject 5 Units into the skin as needed for high blood sugar.    . memantine (NAMENDA) 10 MG tablet Take 1 tablet (10 mg total) by mouth 2 (two) times daily. 180 tablet 1  . metFORMIN (GLUCOPHAGE-XR) 500 MG 24 hr tablet Take 1,000 mg by mouth at bedtime.      [name] NITROSTAT 0.4 MG SL tablet as needed.    . NON FORMULARY Place 2 L into the nose at bedtime.    . Tamsulosin HCl (FLOMAX) 0.4 MG CAPS Take 0.4 mg by mouth at bedtime.     [name] venlafaxine (EFFEXOR) 75 MG tablet Take 150mg  in the morning and 75mg  at bedtime.    [name] warfarin (COUMADIN) 5 MG tablet Take as directed by coumadin clinic  150 tablet 1  . zolpidem (AMBIEN) 5 MG tablet Take 5 mg by mouth at bedtime.    [name] losartan (COZAAR) 100 MG tablet Take 1 tablet (100 mg total) by mouth daily. 90 tablet 3   No current facility-administered medications for this visit.    Allergies  Allergen Reactions  . Doxycycline Swelling    swollen tongue, anaphylaxis     Social History   Social History  . Marital Status: Married    Spouse Name: Lynnette  . Number of Children: 5  . Years of Education: college   Occupational History  . pharmacist     retired   Social History Main Topics  . Smoking status: Former Smoker -- 1.00 packs/day for 49 years    Quit date: 08/21/2001  . Smokeless tobacco: Never Used     Comment: started at age 77.   [name] Alcohol Use: No     Comment: quit 15 yrs ago  . Drug Use: No  . Sexual Activity: No   Other Topics Concern  . 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   Patient has two children and his wife has three children.   Patient has a college education.   Patient is right- handed.   Patient does not drink any caffeine.    He is a retired Teacher, early years/pre.    Family History  Problem Relation Age of Onset  . Heart disease Father   . Heart disease Paternal Grandfather   . Stroke Mother   . Rheum arthritis Mother   . Colon cancer Neg Hx   . Stomach cancer Neg Hx     Physical Exam: Filed Vitals:   08/14/15 0950  BP: 142/56  Pulse: 80  Height: 5\' 5"  (1.651 m)  Weight: 183 lb 12.8 oz (83.371 kg)    GEN- The patient is well appearing, alert and oriented x 3 today.   Head- normocephalic, atraumatic Eyes-  Sclera clear, conjunctiva pink Ears- hearing intact Oropharynx- clear Neck- supple, no JVP Lymph- no cervical lymphadenopathy Lungs- Clear to ausculation bilaterally, normal work of breathing Heart- Regular rate and rhythm, crisp A2 GI- soft, NT, ND, + BS Extremities- no clubbing, cyanosis, or edema  ekg  08/05/15 reveals sinus rhythm RBBB, LAHB  Recent ER visit reviewed, labs reviewed, echo reviewed  Assessment and Plan:  1. Syncope Worrisome for bradycardic arrhythmia (AV block) in setting of bifascicular block. Will stop metoprolol at this time and follow closely 30 day lifewatch monitor Echo to evaluate for structural changes Stop  hctz which could have also caused syncope by dehydration.  Though this is less likely, labs from ED are suggest prerenal azotemia.  2. Atrial tachycardia Asymptomatic Hopefully will not return off of metoprolol  3. S/p AVR Repeat echo given syncope  4. Atria arrhythmias On coumadin and ASA  5. CAD Stable No change required today  Return to see me in 6 weeks No driving x 6 months given syncope (pt aware) He should contact me immediately if any additional symptoms arise in the interim.  This is a complex medical situation.  Pt is at high risk for recurrent syncope/ injury/ hospitalization.  A high level of decision making was required.  [name] MD, [facility] 08/14/2015

## 2015-08-15 ENCOUNTER — Ambulatory Visit (INDEPENDENT_AMBULATORY_CARE_PROVIDER_SITE_OTHER): Payer: Medicare Other

## 2015-08-15 ENCOUNTER — Encounter: Payer: Self-pay | Admitting: *Deleted

## 2015-08-15 DIAGNOSIS — I4891 Unspecified atrial fibrillation: Secondary | ICD-10-CM | POA: Diagnosis not present

## 2015-08-15 DIAGNOSIS — R55 Syncope and collapse: Secondary | ICD-10-CM

## 2015-08-15 NOTE — Progress Notes (Signed)
Patient ID: Adam Passe, [name], male   DOB: 1935/03/29, 81 y.o.   MRN: [id number] Lifewatch MCT-ACT 30 day cardiac event monitor was applied to patient. Our shipment of Lifewatch MCT1-Patch had not yet been received by the time of Dr. Helaine Chess appointment.  The differences between monitor types was explained to patient and he agreed to proceed with the MCT-ACT.

## 2015-08-16 ENCOUNTER — Other Ambulatory Visit (HOSPITAL_COMMUNITY): Payer: Self-pay | Admitting: Internal Medicine

## 2015-08-16 ENCOUNTER — Encounter: Payer: Self-pay | Admitting: Internal Medicine

## 2015-08-16 ENCOUNTER — Other Ambulatory Visit: Payer: Self-pay

## 2015-08-16 ENCOUNTER — Ambulatory Visit (HOSPITAL_BASED_OUTPATIENT_CLINIC_OR_DEPARTMENT_OTHER): Payer: Medicare Other

## 2015-08-16 ENCOUNTER — Telehealth: Payer: Self-pay | Admitting: Internal Medicine

## 2015-08-16 ENCOUNTER — Ambulatory Visit (HOSPITAL_COMMUNITY)
Admission: RE | Admit: 2015-08-16 | Discharge: 2015-08-16 | Disposition: A | Discharge status: Home / Self Care | Payer: Medicare Other | Source: Ambulatory Visit | Attending: Vascular Surgery | Admitting: Vascular Surgery

## 2015-08-16 DIAGNOSIS — E785 Hyperlipidemia, unspecified: Secondary | ICD-10-CM | POA: Insufficient documentation

## 2015-08-16 DIAGNOSIS — I071 Rheumatic tricuspid insufficiency: Secondary | ICD-10-CM | POA: Insufficient documentation

## 2015-08-16 DIAGNOSIS — Z87891 Personal history of nicotine dependence: Secondary | ICD-10-CM | POA: Insufficient documentation

## 2015-08-16 DIAGNOSIS — I059 Rheumatic mitral valve disease, unspecified: Secondary | ICD-10-CM | POA: Diagnosis not present

## 2015-08-16 DIAGNOSIS — Z953 Presence of xenogenic heart valve: Secondary | ICD-10-CM | POA: Diagnosis not present

## 2015-08-16 DIAGNOSIS — I119 Hypertensive heart disease without heart failure: Secondary | ICD-10-CM | POA: Insufficient documentation

## 2015-08-16 DIAGNOSIS — R55 Syncope and collapse: Secondary | ICD-10-CM | POA: Diagnosis not present

## 2015-08-16 DIAGNOSIS — I340 Nonrheumatic mitral (valve) insufficiency: Secondary | ICD-10-CM | POA: Diagnosis not present

## 2015-08-16 DIAGNOSIS — E119 Type 2 diabetes mellitus without complications: Secondary | ICD-10-CM | POA: Diagnosis not present

## 2015-08-16 NOTE — Telephone Encounter (Signed)
Call from [employer] pt experienced 3.5 second pause. Pt underlying rhythm is NSR with HR of 70. They tried to contact pt with no response.   Nurse called pt home no answer and no VM LM on pt personal VM and on wife cell phone to call out office as we want to check on him. OK per DPR to speak with pt wife.

## 2015-08-16 NOTE — Telephone Encounter (Signed)
New Message:  Adam [name] called in to report and Abnormal EKG. Please f/u with her

## 2015-08-16 NOTE — Telephone Encounter (Signed)
[name] at 08/16/2015 9:47 AM     Status: Signed       Expand All Collapse All   New Message:  Adam [name] called in to report and Abnormal EKG. Please f/u with her       See Adam [name]'s note bellow

## 2015-08-16 NOTE — Telephone Encounter (Signed)
This encounter was created in error - please disregard.

## 2015-08-20 NOTE — Telephone Encounter (Signed)
Spoke with patient and let him know he would need to have a PPM implant with Dr Johney Frame tomorrow.  Patient is aware to be at the hospital at 8am.  NPO after midnight. I have tried to call his wife but have been unable to reach her.  I will take number home and call from home.  Another strip was brought to me at 5:50pm.  More of the same with a 5.8 sec pause.  Discussed with Dr Johney Frame, he is okay with the patient staying at home tonight but wants him to lay around and not do too much.  Come to hospital in the morning at 8am, but if he has any dizziness or feeling like he is going to pass out come to the ER at [facility].  Patient verbalized understanding and knows I will call his wife later from home as she is still not home.

## 2015-08-20 NOTE — Telephone Encounter (Signed)
Dr Johney Frame aware of pts event monitor recordings and has recommendations for pacemaker placement.  Will route this message to covering nurse for further follow-up.

## 2015-08-20 NOTE — Telephone Encounter (Signed)
[name] at [facility] called to inform Dr Johney Frame and nurse that the pt had a recorded event today showing 10 seconds of ventricular standstill and then went into complete heart block.   Per [employer] they made contact with the pt and he stated that he was eating lunch and just sitting there, and became light-headed.  Santiago Bur to fax this report now to [phone] and [phone] attention Dr Johney Frame and nurse.  [name] verbalized understanding and agrees with this plan.

## 2015-08-20 NOTE — Telephone Encounter (Signed)
Spoke with the pt in regards to [employer] reporting his recorded event today showing 10 secs of ventricular standstill and complete heartblock.  Per the pt he states this happened around noon, and he was sitting at the table reading the newspaper and eating lunch, and he began to feel very light-headed and faint.  Pt states within minutes, [employer] called to ask him his current symptoms for abnormal recorded event.  Notified Dr Jenel Lucks nurse that [employer] will be faxing [place] a report in a few minutes and I will be bringing this to them for further review and recommendation.  Advised the pt I will show this monitor to Dr Johney Frame for further review and recommendation, and someone from our office will follow-up with him thereafter.  Pt verbalized understanding and agrees with this plan.

## 2015-08-21 ENCOUNTER — Encounter (HOSPITAL_COMMUNITY)
Admission: RE | Disposition: A | Discharge status: Home / Self Care | Payer: Self-pay | Source: Ambulatory Visit | Attending: Internal Medicine

## 2015-08-21 ENCOUNTER — Ambulatory Visit (HOSPITAL_COMMUNITY)
Admission: RE | Admit: 2015-08-21 | Discharge: 2015-08-22 | Disposition: A | Discharge status: Home / Self Care | Payer: Medicare Other | Source: Ambulatory Visit | Attending: Internal Medicine | Admitting: Internal Medicine

## 2015-08-21 ENCOUNTER — Encounter (HOSPITAL_COMMUNITY): Payer: Self-pay | Admitting: *Deleted

## 2015-08-21 DIAGNOSIS — E119 Type 2 diabetes mellitus without complications: Secondary | ICD-10-CM | POA: Diagnosis not present

## 2015-08-21 DIAGNOSIS — G4733 Obstructive sleep apnea (adult) (pediatric): Secondary | ICD-10-CM | POA: Diagnosis not present

## 2015-08-21 DIAGNOSIS — I10 Essential (primary) hypertension: Secondary | ICD-10-CM | POA: Insufficient documentation

## 2015-08-21 DIAGNOSIS — R55 Syncope and collapse: Secondary | ICD-10-CM | POA: Diagnosis not present

## 2015-08-21 DIAGNOSIS — Z7982 Long term (current) use of aspirin: Secondary | ICD-10-CM | POA: Diagnosis not present

## 2015-08-21 DIAGNOSIS — E785 Hyperlipidemia, unspecified: Secondary | ICD-10-CM | POA: Insufficient documentation

## 2015-08-21 DIAGNOSIS — Z87891 Personal history of nicotine dependence: Secondary | ICD-10-CM | POA: Diagnosis not present

## 2015-08-21 DIAGNOSIS — M1990 Unspecified osteoarthritis, unspecified site: Secondary | ICD-10-CM | POA: Insufficient documentation

## 2015-08-21 DIAGNOSIS — Z951 Presence of aortocoronary bypass graft: Secondary | ICD-10-CM | POA: Diagnosis not present

## 2015-08-21 DIAGNOSIS — F329 Major depressive disorder, single episode, unspecified: Secondary | ICD-10-CM | POA: Insufficient documentation

## 2015-08-21 DIAGNOSIS — Z952 Presence of prosthetic heart valve: Secondary | ICD-10-CM | POA: Diagnosis not present

## 2015-08-21 DIAGNOSIS — Z794 Long term (current) use of insulin: Secondary | ICD-10-CM | POA: Diagnosis not present

## 2015-08-21 DIAGNOSIS — I441 Atrioventricular block, second degree: Secondary | ICD-10-CM

## 2015-08-21 DIAGNOSIS — Z7984 Long term (current) use of oral hypoglycemic drugs: Secondary | ICD-10-CM | POA: Insufficient documentation

## 2015-08-21 DIAGNOSIS — I2510 Atherosclerotic heart disease of native coronary artery without angina pectoris: Secondary | ICD-10-CM | POA: Diagnosis not present

## 2015-08-21 DIAGNOSIS — I442 Atrioventricular block, complete: Secondary | ICD-10-CM

## 2015-08-21 DIAGNOSIS — Z87442 Personal history of urinary calculi: Secondary | ICD-10-CM | POA: Diagnosis not present

## 2015-08-21 DIAGNOSIS — F419 Anxiety disorder, unspecified: Secondary | ICD-10-CM | POA: Insufficient documentation

## 2015-08-21 DIAGNOSIS — Z7901 Long term (current) use of anticoagulants: Secondary | ICD-10-CM | POA: Diagnosis not present

## 2015-08-21 DIAGNOSIS — Z959 Presence of cardiac and vascular implant and graft, unspecified: Secondary | ICD-10-CM

## 2015-08-21 DIAGNOSIS — Z8249 Family history of ischemic heart disease and other diseases of the circulatory system: Secondary | ICD-10-CM | POA: Insufficient documentation

## 2015-08-21 DIAGNOSIS — I480 Paroxysmal atrial fibrillation: Secondary | ICD-10-CM | POA: Diagnosis not present

## 2015-08-21 DIAGNOSIS — F0390 Unspecified dementia without behavioral disturbance: Secondary | ICD-10-CM | POA: Diagnosis not present

## 2015-08-21 HISTORY — DX: Atrioventricular block, complete: I44.2

## 2015-08-21 HISTORY — PX: EP IMPLANTABLE DEVICE: SHX172B

## 2015-08-21 LAB — GLUCOSE, CAPILLARY
GLUCOSE-CAPILLARY: 143 mg/dL — AB (ref 65–99)
Glucose-Capillary: 136 mg/dL — ABNORMAL HIGH (ref 65–99)
Glucose-Capillary: 191 mg/dL — ABNORMAL HIGH (ref 65–99)

## 2015-08-21 LAB — SURGICAL PCR SCREEN
MRSA, PCR: NEGATIVE
Staphylococcus aureus: POSITIVE — AB

## 2015-08-21 LAB — PROTIME-INR
INR: 1.69 — AB (ref 0.00–1.49)
PROTHROMBIN TIME: 19.9 s — AB (ref 11.6–15.2)

## 2015-08-21 SURGERY — PACEMAKER IMPLANT

## 2015-08-21 MED ORDER — HYDROCODONE-ACETAMINOPHEN 5-325 MG PO TABS
1.0000 | ORAL_TABLET | ORAL | Status: DC | PRN
Start: 2015-08-21 — End: 2015-08-22

## 2015-08-21 MED ORDER — LOSARTAN POTASSIUM 50 MG PO TABS
100.0000 mg | ORAL_TABLET | Freq: Every day | ORAL | Status: DC
  Administered 2015-08-21: 100 mg via ORAL
  Filled 2015-08-21: qty 2

## 2015-08-21 MED ORDER — MUPIROCIN 2 % EX OINT: 1.0000 "application " | TOPICAL_OINTMENT | Freq: Once | CUTANEOUS | Status: DC

## 2015-08-21 MED ORDER — FENTANYL CITRATE (PF) 100 MCG/2ML IJ SOLN
INTRAMUSCULAR | Status: DC | PRN
  Administered 2015-08-21: 12.5 ug via INTRAVENOUS

## 2015-08-21 MED ORDER — SODIUM CHLORIDE 0.9 % IV SOLN: 250.0000 mL | INTRAVENOUS | Status: DC | PRN

## 2015-08-21 MED ORDER — ACETAMINOPHEN 325 MG PO TABS: 325.0000 mg | ORAL_TABLET | ORAL | Status: DC | PRN

## 2015-08-21 MED ORDER — SODIUM CHLORIDE 0.9 % IV SOLN
INTRAVENOUS | Status: DC
  Administered 2015-08-21: 09:00:00 via INTRAVENOUS

## 2015-08-21 MED ORDER — VENLAFAXINE HCL 75 MG PO TABS
75.0000 mg | ORAL_TABLET | Freq: Every day | ORAL | Status: DC
  Filled 2015-08-21: qty 1

## 2015-08-21 MED ORDER — INSULIN ASPART 100 UNIT/ML ~~LOC~~ SOLN
0.0000 [IU] | Freq: Three times a day (TID) | SUBCUTANEOUS | Status: DC
  Administered 2015-08-22: 1 [IU] via SUBCUTANEOUS

## 2015-08-21 MED ORDER — IOHEXOL 350 MG/ML SOLN
INTRAVENOUS | Status: DC | PRN
  Administered 2015-08-21: 15 mL via INTRAVENOUS

## 2015-08-21 MED ORDER — LORAZEPAM 1 MG PO TABS: 1.0000 mg | ORAL_TABLET | Freq: Three times a day (TID) | ORAL | Status: DC | PRN

## 2015-08-21 MED ORDER — VENLAFAXINE HCL 75 MG PO TABS
75.0000 mg | ORAL_TABLET | Freq: Every day | ORAL | Status: DC
  Administered 2015-08-21: 75 mg via ORAL
  Filled 2015-08-21: qty 1

## 2015-08-21 MED ORDER — ZOLPIDEM TARTRATE 5 MG PO TABS
5.0000 mg | ORAL_TABLET | Freq: Every day | ORAL | Status: DC
  Administered 2015-08-21: 5 mg via ORAL
  Filled 2015-08-21: qty 1

## 2015-08-21 MED ORDER — SODIUM CHLORIDE 0.9% FLUSH
3.0000 mL | Freq: Two times a day (BID) | INTRAVENOUS | Status: DC
Start: 2015-08-21 — End: 2015-08-22
  Administered 2015-08-21 (×2): 3 mL via INTRAVENOUS

## 2015-08-21 MED ORDER — INSULIN GLARGINE 100 UNIT/ML ~~LOC~~ SOLN
20.0000 [IU] | Freq: Every day | SUBCUTANEOUS | Status: DC
  Administered 2015-08-21: 20 [IU] via SUBCUTANEOUS
  Filled 2015-08-21 (×3): qty 0.2

## 2015-08-21 MED ORDER — HEPARIN (PORCINE) IN NACL 2-0.9 UNIT/ML-% IJ SOLN
INTRAMUSCULAR | Status: DC | PRN
  Administered 2015-08-21: 12:00:00

## 2015-08-21 MED ORDER — LOSARTAN POTASSIUM 50 MG PO TABS: 100.0000 mg | ORAL_TABLET | Freq: Every day | ORAL | Status: DC

## 2015-08-21 MED ORDER — MELATONIN 3 MG PO TABS
3.0000 mg | ORAL_TABLET | Freq: Every evening | ORAL | Status: DC | PRN
  Filled 2015-08-21: qty 1

## 2015-08-21 MED ORDER — TAMSULOSIN HCL 0.4 MG PO CAPS
0.4000 mg | ORAL_CAPSULE | Freq: Every day | ORAL | Status: DC
  Administered 2015-08-21: 0.4 mg via ORAL
  Filled 2015-08-21: qty 1

## 2015-08-21 MED ORDER — ONDANSETRON HCL 4 MG/2ML IJ SOLN: 4.0000 mg | Freq: Four times a day (QID) | INTRAMUSCULAR | Status: DC | PRN

## 2015-08-21 MED ORDER — CHLORHEXIDINE GLUCONATE 4 % EX LIQD: 60.0000 mL | Freq: Once | CUTANEOUS | Status: DC

## 2015-08-21 MED ORDER — CEFAZOLIN SODIUM-DEXTROSE 2-3 GM-% IV SOLR
INTRAVENOUS | Status: AC
  Filled 2015-08-21: qty 50

## 2015-08-21 MED ORDER — CILOSTAZOL 50 MG PO TABS
50.0000 mg | ORAL_TABLET | Freq: Two times a day (BID) | ORAL | Status: DC
  Filled 2015-08-21 (×2): qty 1

## 2015-08-21 MED ORDER — CEFAZOLIN SODIUM-DEXTROSE 2-3 GM-% IV SOLR
2.0000 g | INTRAVENOUS | Status: AC
  Administered 2015-08-21: 2 g via INTRAVENOUS

## 2015-08-21 MED ORDER — MIDAZOLAM HCL 5 MG/5ML IJ SOLN
INTRAMUSCULAR | Status: AC
  Filled 2015-08-21: qty 5

## 2015-08-21 MED ORDER — MOMETASONE FURO-FORMOTEROL FUM 200-5 MCG/ACT IN AERO
2.0000 | INHALATION_SPRAY | Freq: Two times a day (BID) | RESPIRATORY_TRACT | Status: DC
  Administered 2015-08-21: 2 via RESPIRATORY_TRACT
  Filled 2015-08-21 (×2): qty 8.8

## 2015-08-21 MED ORDER — MIDAZOLAM HCL 5 MG/5ML IJ SOLN
INTRAMUSCULAR | Status: DC | PRN
  Administered 2015-08-21 (×2): 1 mg via INTRAVENOUS

## 2015-08-21 MED ORDER — MEMANTINE HCL 10 MG PO TABS
10.0000 mg | ORAL_TABLET | Freq: Two times a day (BID) | ORAL | Status: DC
  Administered 2015-08-21: 10 mg via ORAL
  Filled 2015-08-21 (×2): qty 1

## 2015-08-21 MED ORDER — HEPARIN (PORCINE) IN NACL 2-0.9 UNIT/ML-% IJ SOLN
INTRAMUSCULAR | Status: AC
Start: 2015-08-21 — End: 2015-08-21
  Filled 2015-08-21: qty 500

## 2015-08-21 MED ORDER — CEFAZOLIN SODIUM 1-5 GM-% IV SOLN
1.0000 g | Freq: Four times a day (QID) | INTRAVENOUS | Status: AC
  Administered 2015-08-21 – 2015-08-22 (×3): 1 g via INTRAVENOUS
  Filled 2015-08-21 (×3): qty 50

## 2015-08-21 MED ORDER — LIDOCAINE HCL (PF) 1 % IJ SOLN
INTRAMUSCULAR | Status: AC
  Filled 2015-08-21: qty 60

## 2015-08-21 MED ORDER — WARFARIN - PHYSICIAN DOSING INPATIENT: Freq: Every day | Status: DC

## 2015-08-21 MED ORDER — SODIUM CHLORIDE 0.9% FLUSH: 3.0000 mL | INTRAVENOUS | Status: DC | PRN

## 2015-08-21 MED ORDER — MUPIROCIN 2 % EX OINT
1.0000 "application " | TOPICAL_OINTMENT | Freq: Two times a day (BID) | CUTANEOUS | Status: DC
  Administered 2015-08-21: 1 via NASAL

## 2015-08-21 MED ORDER — WARFARIN SODIUM 7.5 MG PO TABS
7.5000 mg | ORAL_TABLET | Freq: Once | ORAL | Status: AC
  Administered 2015-08-21: 7.5 mg via ORAL
  Filled 2015-08-21: qty 1

## 2015-08-21 MED ORDER — MUPIROCIN 2 % EX OINT
TOPICAL_OINTMENT | CUTANEOUS | Status: AC
  Administered 2015-08-21: 09:00:00
  Filled 2015-08-21: qty 22

## 2015-08-21 MED ORDER — SODIUM CHLORIDE 0.9 % IR SOLN
Status: AC
  Filled 2015-08-21: qty 2

## 2015-08-21 MED ORDER — VENLAFAXINE HCL 75 MG PO TABS
150.0000 mg | ORAL_TABLET | Freq: Every day | ORAL | Status: DC
  Filled 2015-08-21: qty 2

## 2015-08-21 MED ORDER — FENTANYL CITRATE (PF) 100 MCG/2ML IJ SOLN
INTRAMUSCULAR | Status: AC
  Filled 2015-08-21: qty 2

## 2015-08-21 MED ORDER — CHLORHEXIDINE GLUCONATE CLOTH 2 % EX PADS
6.0000 | MEDICATED_PAD | Freq: Every day | CUTANEOUS | Status: DC
  Administered 2015-08-22: 6 via TOPICAL

## 2015-08-21 MED ORDER — SODIUM CHLORIDE 0.9 % IR SOLN
80.0000 mg | Status: AC
  Administered 2015-08-21: 80 mg

## 2015-08-21 MED ORDER — VENLAFAXINE HCL 75 MG PO TABS: 75.0000 mg | ORAL_TABLET | Freq: Two times a day (BID) | ORAL | Status: DC

## 2015-08-21 MED ORDER — LIDOCAINE HCL (PF) 1 % IJ SOLN
INTRAMUSCULAR | Status: DC | PRN
  Administered 2015-08-21: 60 mL

## 2015-08-21 SURGICAL SUPPLY — 7 items
CABLE SURGICAL S-101-97-12 (CABLE) ×3 IMPLANT
LEAD CAPSURE NOVUS 5076-58CM (Lead) ×2 IMPLANT
LEAD TENDRIL SDX 2088TC-46CM (Lead) ×2 IMPLANT
PAD DEFIB LIFELINK (PAD) ×2 IMPLANT
PPM ASSURITY DR PM2240 (Pacemaker) ×3 IMPLANT
SHEATH CLASSIC 7F (SHEATH) ×4 IMPLANT
TRAY PACEMAKER INSERTION (PACKS) ×2 IMPLANT

## 2015-08-21 NOTE — H&P (View-Only) (Signed)
PCP:  Garlan Fillers, MD  The patient presents today for routine electrophysiology followup after a recent episode of syncope.  He reports that on 08/05/15, he was seated and having lunch when he had abrupt loss of consciousness with collapse.  The episode occurred without warning and lasted a brief period of time.  He did not have incontinence or any features of seizure.  He was evaluated at [facility] ED and discharged to follow-up with me.  He has done well since.    Today, he denies symptoms of palpitations, chest pain, orthopnea, PND, lower extremity edema, dizziness, or neurologic sequela.  The patient feels that he is tolerating medications without difficulties and is otherwise without complaint today.   Past Medical History  Diagnosis Date  . Hypertension   . Hyperlipidemia   . Type II or unspecified type diabetes mellitus without mention of complication, not stated as uncontrolled   . Nephrolithiasis   . Atrial flutter [facility])     s/p CTI ablation by Dr Ladona Ridgel 1/12  . CAD (coronary artery disease)     s/p CABG x 5 2003 by Dr Laneta Simmers  . Aortic stenosis, moderate     moderate to severe per echo 03/2012 with AVR in March of 2014  . DJD (degenerative joint disease)   . DDD (degenerative disc disease)   . Spinal stenosis of lumbar region   . Bifascicular block   . Atrial tachycardia (HCC)   . Anxiety   . Depression   . S/P AVR (aortic valve replacement)   . OSA (obstructive sleep apnea)   . 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 Diverticulosis   . Hemorrhoids    Past Surgical History  Procedure Laterality Date  . Cabg  2003    by Dr Laneta Simmers  . Anal fissure repair    . Leg surgery      right  . Trigger finger release      bilat.  . Tonsillectomy    . Back surgery  2010  . Atrial ablation surgery  1/12    atrial flutter ablation by Dr Ladona Ridgel  . Cardioversion  04/07/2012    Procedure: CARDIOVERSION;  Surgeon: Wendall Stade, MD;  Location: [facility]  ENDOSCOPY;  Service: Cardiovascular;  Laterality: N/A;  . Coronary artery bypass graft      10 yrs ago   03  . Aortic valve replacement N/A 09/01/2012    Procedure: AORTIC VALVE REPLACEMENT (AVR);  Surgeon: Alleen Borne, MD;  Location: [facility] OR;  Service: Open Heart Surgery;  Laterality: N/A;  . Intraoperative transesophageal echocardiogram N/A 09/01/2012    Procedure: INTRAOPERATIVE TRANSESOPHAGEAL ECHOCARDIOGRAM;  Surgeon: Alleen Borne, MD;  Location: [facility] OR;  Service: Open Heart Surgery;  Laterality: N/A;    Current Outpatient Prescriptions  Medication Sig Dispense Refill  . albuterol (PROVENTIL HFA;VENTOLIN HFA) 108 (90 BASE) MCG/ACT inhaler Inhale 2 puffs into the lungs every 6 (six) hours as needed for wheezing. 1 Inhaler 6  . aspirin EC 325 MG EC tablet Take 1 tablet (325 mg total) by mouth daily. 30 tablet   . budesonide-formoterol (SYMBICORT) 160-4.5 MCG/ACT inhaler Inhale 2 puffs into the lungs 2 (two) times daily. (Patient taking differently: Inhale 2 puffs into the lungs daily. ) 1 Inhaler 5  . cilostazol (PLETAL) 50 MG tablet Take 50 mg by mouth 2 (two) times daily.    . fluticasone (FLONASE) 50 MCG/ACT nasal spray Place 1-2 sprays into both nostrils daily.     . insulin  glargine (LANTUS) 100 UNIT/ML injection Inject 20 Units into the skin at bedtime.     . insulin lispro (HUMALOG) 100 UNIT/ML injection Inject 5 Units into the skin as needed for high blood sugar.    . memantine (NAMENDA) 10 MG tablet Take 1 tablet (10 mg total) by mouth 2 (two) times daily. 180 tablet 1  . metFORMIN (GLUCOPHAGE-XR) 500 MG 24 hr tablet Take 1,000 mg by mouth at bedtime.      [name] NITROSTAT 0.4 MG SL tablet as needed.    . NON FORMULARY Place 2 L into the nose at bedtime.    . Tamsulosin HCl (FLOMAX) 0.4 MG CAPS Take 0.4 mg by mouth at bedtime.     [name] venlafaxine (EFFEXOR) 75 MG tablet Take 150mg  in the morning and 75mg  at bedtime.    [name] warfarin (COUMADIN) 5 MG tablet Take as directed by coumadin clinic  150 tablet 1  . zolpidem (AMBIEN) 5 MG tablet Take 5 mg by mouth at bedtime.    [name] losartan (COZAAR) 100 MG tablet Take 1 tablet (100 mg total) by mouth daily. 90 tablet 3   No current facility-administered medications for this visit.    Allergies  Allergen Reactions  . Doxycycline Swelling    swollen tongue, anaphylaxis     Social History   Social History  . Marital Status: Married    Spouse Name: Lynnette  . Number of Children: 5  . Years of Education: college   Occupational History  . pharmacist     retired   Social History Main Topics  . Smoking status: Former Smoker -- 1.00 packs/day for 49 years    Quit date: 08/21/2001  . Smokeless tobacco: Never Used     Comment: started at age 77.   [name] Alcohol Use: No     Comment: quit 15 yrs ago  . Drug Use: No  . Sexual Activity: No   Other Topics Concern  . 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   Patient has two children and his wife has three children.   Patient has a college education.   Patient is right- handed.   Patient does not drink any caffeine.    He is a retired Teacher, early years/pre.    Family History  Problem Relation Age of Onset  . Heart disease Father   . Heart disease Paternal Grandfather   . Stroke Mother   . Rheum arthritis Mother   . Colon cancer Neg Hx   . Stomach cancer Neg Hx     Physical Exam: Filed Vitals:   08/14/15 0950  BP: 142/56  Pulse: 80  Height: 5\' 5"  (1.651 m)  Weight: 183 lb 12.8 oz (83.371 kg)    GEN- The patient is well appearing, alert and oriented x 3 today.   Head- normocephalic, atraumatic Eyes-  Sclera clear, conjunctiva pink Ears- hearing intact Oropharynx- clear Neck- supple, no JVP Lymph- no cervical lymphadenopathy Lungs- Clear to ausculation bilaterally, normal work of breathing Heart- Regular rate and rhythm, crisp A2 GI- soft, NT, ND, + BS Extremities- no clubbing, cyanosis, or edema  ekg  08/05/15 reveals sinus rhythm RBBB, LAHB  Recent ER visit reviewed, labs reviewed, echo reviewed  Assessment and Plan:  1. Syncope Worrisome for bradycardic arrhythmia (AV block) in setting of bifascicular block. Will stop metoprolol at this time and follow closely 30 day [employer] monitor Echo to evaluate for structural changes Stop  hctz which could have also caused syncope by dehydration.  Though this is less likely, labs from ED are suggest prerenal azotemia.  2. Atrial tachycardia Asymptomatic Hopefully will not return off of metoprolol  3. S/p AVR Repeat echo given syncope  4. Atria arrhythmias On coumadin and ASA  5. CAD Stable No change required today  Return to see me in 6 weeks No driving x 6 months given syncope (pt aware) He should contact me immediately if any additional symptoms arise in the interim.  This is a complex medical situation.  Pt is at high risk for recurrent syncope/ injury/ hospitalization.  A high level of decision making was required.  [name] MD, [facility] 08/14/2015

## 2015-08-21 NOTE — Discharge Summary (Signed)
ELECTROPHYSIOLOGY PROCEDURE DISCHARGE SUMMARY    Patient ID: Adam HENNESSEE, [name],  MRN: [id number], DOB/AGE: March 28, 1935 81 y.o.  Admit date: 08/21/2015 Discharge date: 08/22/15  Primary Care Physician: Garlan Fillers, MD Primary Cardiologist/Electrophysiologist: Dr. Johney Frame  Primary Discharge Diagnosis:  Symptomatic bradycardia status post pacemaker implantation this admission  Secondary Discharge Diagnosis:  1. HTN 2. HLD 3. DM 4. CAD 5. AVR     Bioprosthetic 6. PAFib     On coumadin monitored and managed with coumadin clinic   Allergies  Allergen Reactions  . Doxycycline Swelling    swollen tongue      Procedures This Admission:  1.  Implantation of a STJ dual chamber PPM on 08/21/15 by Dr Johney Frame.  The patient received a [address] Assurity DR model [phone] (serial number [id number]) PPM, with [facility] model [phone] (serial number [id number]) right atrial lead and a Medtronic model [id number] (serial number [id number]) right ventricular lead. There were no immediate post procedure complications. 2.  CXR on 08/22/15 demonstrated no pneumothorax status post device implantation.   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  Past medical history includes PAFib/flutter, VHD/AVR, CAD, HTN.  The patient has had symptomatic bradycardia without reversible causes identified.  Risks, benefits, and alternatives to PPM implantation were reviewed with the patient who wished to proceed.   Hospital Course:  The patient was admitted and underwent implantation of a PPM with details as outlined above.  He was monitored on telemetry overnight which demonstrated SR/V pacing.  Left chest was without hematoma or ecchymosis.  The device was interrogated and found to be  functioning normally.  CXR was obtained and demonstrated no pneumothorax status post device implantation.  Wound care, arm mobility, and restrictions were reviewed with the patient.  The patient was examined by Dr. Johney Frame, and considered stable for discharge to home. The patient has been instructed no driving until cleared by Dr. Johney Frame given his syncopal event (at least 6 weeks).  His next INR is 08/27/15 at 2:00PM.  He will take  today and tomorrow resume his home schedule for his coumadin, the patient and wife are instructed and understand. Continue the nasal mupirocin ointment BID for total of 5 days (4 additional days) with + nasal swab here.  The patient/wife are instructed.   Physical Exam: Filed Vitals:   08/21/15 2306 08/22/15 0205 08/22/15 0444 08/22/15 0752  BP:   165/68 161/70  Pulse:   92 93  Temp:   98.6 F (37 C) 97.7 F (36.5 C)  TempSrc:   Oral Oral  Resp:   19   Height:      Weight:  179 lb 10.8 oz (81.5 kg)    SpO2: 97%  97% 91%    GEN- The patient is well appearing, alert and oriented x 3 today.   HEENT: normocephalic, atraumatic; sclera clear, conjunctiva pink; hearing intact; oropharynx clear; neck supple, no JVP Lungs- Clear to ausculation bilaterally, normal work of breathing.  No wheezes, rales, rhonchi Heart- Regular rate and rhythm, no murmurs, rubs or gallops, PMI not laterally displaced GI- soft, non-tender, non-distended, bowel sounds present, no hepatosplenomegaly Extremities- no clubbing, cyanosis, or edema MS- no significant deformity or atrophy Skin- warm and dry, no rash or lesion, left chest without hematoma/ecchymosis Psych- euthymic mood,  full affect Neuro- no gross deficits   Labs:   Lab Results  Component Value Date   WBC 6.5 08/05/2015   HGB 13.9 08/05/2015   HCT 42.6 08/05/2015   MCV 86.1 08/05/2015   PLT 308 08/05/2015     Recent Labs Lab 08/22/15 0345  NA 142  K 3.6  CL 109  CO2 25  BUN 13  CREATININE 0.85  CALCIUM 8.4*    GLUCOSE 157*    Discharge Medications:    Medication List    TAKE these medications        albuterol 108 (90 Base) MCG/ACT inhaler  Commonly known as:  PROVENTIL HFA;VENTOLIN HFA  Inhale 2 puffs into the lungs every 6 (six) hours as needed for wheezing.     aspirin 325 MG EC tablet  Take 1 tablet (325 mg total) by mouth daily.     budesonide-formoterol 160-4.5 MCG/ACT inhaler  Commonly known as:  SYMBICORT  Inhale 2 puffs into the lungs 2 (two) times daily.  Notes to Patient:  Continue as instructed by the prescribing physician     cilostazol 50 MG tablet  Commonly known as:  PLETAL  Take 50 mg by mouth 2 (two) times daily.     fluticasone 50 MCG/ACT nasal spray  Commonly known as:  FLONASE  Place 1-2 sprays into both nostrils daily.     insulin glargine 100 UNIT/ML injection  Commonly known as:  LANTUS  Inject 20 Units into the skin at bedtime.     insulin lispro 100 UNIT/ML injection  Commonly known as:  HUMALOG  Inject 5 Units into the skin as needed for high blood sugar.     LORazepam 1 MG tablet  Commonly known as:  ATIVAN  Take 1 mg by mouth 3 (three) times daily as needed for anxiety.     losartan 100 MG tablet  Commonly known as:  COZAAR  Take 1 tablet (100 mg total) by mouth daily.     Melatonin 3 MG Caps  Take 3 mg by mouth daily.     memantine 10 MG tablet  Commonly known as:  NAMENDA  Take 1 tablet (10 mg total) by mouth 2 (two) times daily.     multivitamin with minerals tablet  Take 1 tablet by mouth daily.     mupirocin ointment 2 %  Commonly known as:  BACTROBAN  Place 1 application into the nose 2 (two) times daily.     NITROSTAT 0.4 MG SL tablet  Generic drug:  nitroGLYCERIN  Place 0.4 mg under the tongue every 5 (five) minutes as needed for chest pain (up to 3 times).     NON FORMULARY  Place 2 L into the nose at bedtime.     tamsulosin 0.4 MG Caps capsule  Commonly known as:  FLOMAX  Take 0.4 mg by mouth at bedtime.      venlafaxine 75 MG tablet  Commonly known as:  EFFEXOR  Take 75-150 mg by mouth 2 (two) times daily with a meal. Take  in the morning and  at bedtime.     warfarin 5 MG tablet  Commonly known as:  COUMADIN  Take as directed by coumadin clinic  Notes to Patient:  Take  today then resume your home regime tomorrow 08/23/15     zolpidem 5 MG tablet  Commonly known as:  AMBIEN  Take 5 mg by mouth at bedtime.        Disposition:  Home Discharge Instructions  Diet - low sodium heart healthy    Complete by:  As directed      Increase activity slowly    Complete by:  As directed           Follow-up Information    Follow up with [facility] On 09/04/2015.   Specialty:  Cardiology   Why:  4:30PM wound check   Contact information:   [address] [place] [address] [phone]      Follow up with [name], MD On 12/02/2015.   Specialty:  Cardiology   Why:  12;15PM   Contact information:   [address] [place] [address] [phone]       Follow up with [facility] On 08/27/2015.   Specialty:  Cardiology   Why:  2:00PM coumadin clinic   Contact information:   [address] [place] [address] [phone]      Duration of Discharge Encounter: Greater than 30 minutes including physician time.  Signed, Francis Dowse, PA-C 08/22/2015 7:55 AM    I have seen, examined the patient, and reviewed the above assessment and plan. Device interrogation reviewed and normal.  Changes to above are made where necessary.    Co Sign: [name], MD 08/22/2015 9:49 AM

## 2015-08-21 NOTE — Progress Notes (Signed)
Patient came to the floor around 1400 from cath lab, alert oriented, denies pain. V/s is stable, pacemaker site dressing is clean and dry no bleeding.L arm in sling, wife at bedside. Will continue to monitor patient.

## 2015-08-21 NOTE — Progress Notes (Signed)
Orthopedic Tech Progress Note Patient Details:  Adam KERLIN, [name] January 12, 1935 [id number] Patient already has arm sling. Patient ID: Adam Passe, [name], male   DOB: 1935-05-07, 81 y.o.   MRN: [id number]   Adam [name] 08/21/2015, 4:22 PM

## 2015-08-21 NOTE — Interval H&P Note (Signed)
History and Physical Interval Note:  08/21/2015 8:46 AM  Pt with syncope and bifascicular block.  Now with recurrent presyncope and transient complete heart block observed on telemetry monitoring.  No reversible causes are found.  I would therefore recommend pacemaker implantation at this time.  Risks, benefits, alternatives to pacemaker implantation were discussed in detail with the patient today. The patient understands that the risks include but are not limited to bleeding, infection, pneumothorax, perforation, tamponade, vascular damage, renal failure, MI, stroke, death,  and lead dislodgement and wishes to proceed.  INR pending.    Adam Passe, [name]  has presented today for surgery, with the diagnosis of hb  The various methods of treatment have been discussed with the patient and family. After consideration of risks, benefits and other options for treatment, the patient has consented to  Procedure(s): Pacemaker Implant (N/A) as a surgical intervention .  The patient's history has been reviewed, patient examined, no change in status, stable for surgery.  I have reviewed the patient's chart and labs.  Questions were answered to the patient's satisfaction.     Adam [name]

## 2015-08-21 NOTE — Discharge Instructions (Signed)
° ° °  Supplemental Discharge Instructions for  Pacemaker/Defibrillator Patients  Activity No heavy lifting or vigorous activity with your left/right arm for 6 to 8 weeks.  Do not raise your left/right arm above your head for one week.  Gradually raise your affected arm as drawn below.             08/25/15                       08/26/15                       08/28/15                    08/29/15 __  NO DRIVING for  Until cleared by Dr. Johney Frame  WOUND CARE - Keep the wound area clean and dry.  Do not get this area wet until your wound check visit. No showers for until cleared to at wound check visit - The tape/steri-strips on your wound will fall off; do not pull them off.  No bandage is needed on the site.  DO  NOT apply any creams, oils, or ointments to the wound area. - If you notice any drainage or discharge from the wound, any swelling or bruising at the site, or you develop a fever > 101? F after you are discharged home, call the office at once.  Special Instructions - You are still able to use cellular telephones; use the ear opposite the side where you have your pacemaker/defibrillator.  Avoid carrying your cellular phone near your device. - When traveling through airports, show security personnel your identification card to avoid being screened in the metal detectors.  Ask the security personnel to use the hand wand. - Avoid arc welding equipment, MRI testing (magnetic resonance imaging), TENS units (transcutaneous nerve stimulators).  Call the office for questions about other devices. - Avoid electrical appliances that are in poor condition or are not properly grounded. - Microwave ovens are safe to be near or to operate.  Additional information for defibrillator patients should your device go off: - If your device goes off ONCE and you feel fine afterward, notify the device clinic nurses. - If your device goes off ONCE and you do not feel well afterward, call 911. - If your device goes off  TWICE, call 911. - If your device goes off THREE times in one day, call 911.  DO NOT DRIVE YOURSELF OR A FAMILY MEMBER WITH A DEFIBRILLATOR TO THE HOSPITAL--CALL 911.

## 2015-08-22 ENCOUNTER — Encounter (HOSPITAL_COMMUNITY): Payer: Self-pay | Admitting: Internal Medicine

## 2015-08-22 ENCOUNTER — Telehealth: Payer: Self-pay | Admitting: Internal Medicine

## 2015-08-22 ENCOUNTER — Ambulatory Visit (HOSPITAL_COMMUNITY): Payer: Medicare Other

## 2015-08-22 DIAGNOSIS — I10 Essential (primary) hypertension: Secondary | ICD-10-CM | POA: Diagnosis not present

## 2015-08-22 DIAGNOSIS — I442 Atrioventricular block, complete: Secondary | ICD-10-CM | POA: Diagnosis not present

## 2015-08-22 DIAGNOSIS — I480 Paroxysmal atrial fibrillation: Secondary | ICD-10-CM | POA: Diagnosis not present

## 2015-08-22 DIAGNOSIS — R55 Syncope and collapse: Secondary | ICD-10-CM | POA: Diagnosis not present

## 2015-08-22 LAB — BASIC METABOLIC PANEL
ANION GAP: 8 (ref 5–15)
BUN: 13 mg/dL (ref 6–20)
CALCIUM: 8.4 mg/dL — AB (ref 8.9–10.3)
CO2: 25 mmol/L (ref 22–32)
CREATININE: 0.85 mg/dL (ref 0.61–1.24)
Chloride: 109 mmol/L (ref 101–111)
Glucose, Bld: 157 mg/dL — ABNORMAL HIGH (ref 65–99)
Potassium: 3.6 mmol/L (ref 3.5–5.1)
Sodium: 142 mmol/L (ref 135–145)

## 2015-08-22 LAB — PROTIME-INR
INR: 2.3 — AB (ref 0.00–1.49)
Prothrombin Time: 25.1 seconds — ABNORMAL HIGH (ref 11.6–15.2)

## 2015-08-22 LAB — GLUCOSE, CAPILLARY: GLUCOSE-CAPILLARY: 142 mg/dL — AB (ref 65–99)

## 2015-08-22 MED ORDER — MUPIROCIN 2 % EX OINT: 1.0000 "application " | TOPICAL_OINTMENT | Freq: Two times a day (BID) | CUTANEOUS | Status: AC

## 2015-08-22 MED FILL — Sodium Chloride Irrigation Soln 0.9%: Qty: 500 | Status: AC

## 2015-08-22 MED FILL — Gentamicin Sulfate Inj 40 MG/ML: INTRAMUSCULAR | Qty: 2 | Status: AC

## 2015-08-22 NOTE — Discharge Summary (Signed)
Pt got discharged to home, discharge instructions provided and patient showed understanding to it, IV taken out,Telemonitor DC,pt left unit in wheelchair with all of the belongings accompanied with a family member (wife) 

## 2015-08-22 NOTE — Telephone Encounter (Signed)
Pt just received pacemaker for at home transmissions.He have some questions,he said all of it was not there.

## 2015-08-22 NOTE — Telephone Encounter (Signed)
Spoke w/ pt wife and informed her that the pt home monitor was ordered and should be shipped to their home. Pt wife verbalized understanding.

## 2015-08-27 ENCOUNTER — Telehealth: Payer: Self-pay | Admitting: Internal Medicine

## 2015-08-27 NOTE — Telephone Encounter (Signed)
New MEssage  Pt wife calling to speak w/ RN- pt had recent implant- and stated that pt c/o of bruising around site and around nearest nipple- began yesterday 08/26/15. Please call back and discuss.

## 2015-08-27 NOTE — Telephone Encounter (Signed)
Informed EC that ecchymosis around device area is not uncommon after a procedure.   EC denied any swelling in device area or any bleeding.  Plan to follow up with wound check as scheduled.   Encouraged patient/EC to call if they have anymore questions.   EC/patient voiced understanding.

## 2015-08-28 ENCOUNTER — Ambulatory Visit (INDEPENDENT_AMBULATORY_CARE_PROVIDER_SITE_OTHER): Payer: Medicare Other | Admitting: *Deleted

## 2015-08-28 DIAGNOSIS — I4891 Unspecified atrial fibrillation: Secondary | ICD-10-CM

## 2015-08-28 DIAGNOSIS — Z5181 Encounter for therapeutic drug level monitoring: Secondary | ICD-10-CM

## 2015-08-28 DIAGNOSIS — I350 Nonrheumatic aortic (valve) stenosis: Secondary | ICD-10-CM

## 2015-08-28 LAB — POCT INR: INR: 2.3

## 2015-09-04 ENCOUNTER — Encounter: Payer: Self-pay | Admitting: Internal Medicine

## 2015-09-04 ENCOUNTER — Ambulatory Visit (INDEPENDENT_AMBULATORY_CARE_PROVIDER_SITE_OTHER): Payer: Medicare Other | Admitting: *Deleted

## 2015-09-04 DIAGNOSIS — I452 Bifascicular block: Secondary | ICD-10-CM | POA: Diagnosis not present

## 2015-09-04 DIAGNOSIS — I480 Paroxysmal atrial fibrillation: Secondary | ICD-10-CM | POA: Diagnosis not present

## 2015-09-04 LAB — CUP PACEART INCLINIC DEVICE CHECK
Date Time Interrogation Session: 20170315165334
Implantable Lead Implant Date: 20170301
Lead Channel Impedance Value: 475 Ohm
Lead Channel Pacing Threshold Pulse Width: 0.5 ms
Lead Channel Pacing Threshold Pulse Width: 0.5 ms
Lead Channel Sensing Intrinsic Amplitude: 5.2 mV
Lead Channel Setting Pacing Amplitude: 3.5 V
Lead Channel Setting Pacing Amplitude: 3.5 V
Lead Channel Setting Sensing Sensitivity: 4 mV
MDC IDC LEAD IMPLANT DT: 20170301
MDC IDC LEAD LOCATION: 753859
MDC IDC LEAD LOCATION: 753860
MDC IDC MSMT BATTERY REMAINING LONGEVITY: 61.2
MDC IDC MSMT BATTERY VOLTAGE: 3.08 V
MDC IDC MSMT LEADCHNL RA IMPEDANCE VALUE: 412.5 Ohm
MDC IDC MSMT LEADCHNL RA PACING THRESHOLD AMPLITUDE: 0.75 V
MDC IDC MSMT LEADCHNL RA PACING THRESHOLD AMPLITUDE: 0.75 V
MDC IDC MSMT LEADCHNL RA PACING THRESHOLD PULSEWIDTH: 0.5 ms
MDC IDC MSMT LEADCHNL RA SENSING INTR AMPL: 3.1 mV
MDC IDC MSMT LEADCHNL RV PACING THRESHOLD AMPLITUDE: 0.5 V
MDC IDC MSMT LEADCHNL RV PACING THRESHOLD AMPLITUDE: 0.5 V
MDC IDC MSMT LEADCHNL RV PACING THRESHOLD PULSEWIDTH: 0.5 ms
MDC IDC SET LEADCHNL RV PACING PULSEWIDTH: 0.5 ms
MDC IDC STAT BRADY RA PERCENT PACED: 0.27 %
MDC IDC STAT BRADY RV PERCENT PACED: 99.61 %
Pulse Gen Serial Number: 7885823

## 2015-09-04 NOTE — Progress Notes (Signed)
Wound check appointment. Steri-strips removed. Wound without redness or edema. Incision edges approximated, wound well healed. Normal device function. Thresholds, sensing, and impedances consistent with implant measurements. Device programmed at 3.5V for extra safety margin until 3 month visit. Histogram distribution appropriate for patient and level of activity. < 1% AF burden, longest + warfarin. No high ventricular rates noted. Patient educated about wound care, arm mobility, lifting restrictions. ROV with JA.

## 2015-09-05 ENCOUNTER — Telehealth: Payer: Self-pay | Admitting: *Deleted

## 2015-09-05 MED ORDER — METOPROLOL TARTRATE 25 MG PO TABS: 25.0000 mg | ORAL_TABLET | Freq: Two times a day (BID) | ORAL | Status: DC

## 2015-09-05 NOTE — Telephone Encounter (Signed)
Spoke to Dr.Allred about patient's AT that he presented to the office with yesterday. Dr.Allred recommended that patient begin Lopressor 25 BID.  Patient aware of Dr.Allred's recommendations. Patient voiced understanding.  Rx sent in to pharmacy.

## 2015-09-25 ENCOUNTER — Ambulatory Visit (INDEPENDENT_AMBULATORY_CARE_PROVIDER_SITE_OTHER): Payer: Medicare Other | Admitting: *Deleted

## 2015-09-25 ENCOUNTER — Ambulatory Visit: Payer: Medicare Other | Admitting: Internal Medicine

## 2015-09-25 DIAGNOSIS — I480 Paroxysmal atrial fibrillation: Secondary | ICD-10-CM

## 2015-09-25 DIAGNOSIS — I350 Nonrheumatic aortic (valve) stenosis: Secondary | ICD-10-CM | POA: Diagnosis not present

## 2015-09-25 DIAGNOSIS — Z5181 Encounter for therapeutic drug level monitoring: Secondary | ICD-10-CM

## 2015-09-25 LAB — POCT INR: INR: 1.4

## 2015-10-02 ENCOUNTER — Ambulatory Visit (INDEPENDENT_AMBULATORY_CARE_PROVIDER_SITE_OTHER): Payer: Medicare Other | Admitting: *Deleted

## 2015-10-02 DIAGNOSIS — I480 Paroxysmal atrial fibrillation: Secondary | ICD-10-CM

## 2015-10-02 DIAGNOSIS — I350 Nonrheumatic aortic (valve) stenosis: Secondary | ICD-10-CM | POA: Diagnosis not present

## 2015-10-02 DIAGNOSIS — Z5181 Encounter for therapeutic drug level monitoring: Secondary | ICD-10-CM

## 2015-10-02 LAB — POCT INR: INR: 1.8

## 2015-10-16 ENCOUNTER — Ambulatory Visit (INDEPENDENT_AMBULATORY_CARE_PROVIDER_SITE_OTHER): Payer: Medicare Other | Admitting: Surgery

## 2015-10-16 DIAGNOSIS — I350 Nonrheumatic aortic (valve) stenosis: Secondary | ICD-10-CM | POA: Diagnosis not present

## 2015-10-16 DIAGNOSIS — Z5181 Encounter for therapeutic drug level monitoring: Secondary | ICD-10-CM

## 2015-10-16 DIAGNOSIS — I480 Paroxysmal atrial fibrillation: Secondary | ICD-10-CM | POA: Diagnosis not present

## 2015-10-16 LAB — POCT INR: INR: 1.8

## 2015-10-28 ENCOUNTER — Inpatient Hospital Stay (HOSPITAL_COMMUNITY)
Admission: EM | Admit: 2015-10-28 | Discharge: 2015-11-02 | DRG: 689 | Disposition: A | Discharge status: Skilled Nursing Facility | Payer: Medicare Other | Attending: Internal Medicine | Admitting: Internal Medicine

## 2015-10-28 ENCOUNTER — Observation Stay (HOSPITAL_COMMUNITY): Payer: Medicare Other

## 2015-10-28 ENCOUNTER — Encounter (HOSPITAL_COMMUNITY): Payer: Self-pay | Admitting: *Deleted

## 2015-10-28 ENCOUNTER — Emergency Department (HOSPITAL_COMMUNITY): Payer: Medicare Other

## 2015-10-28 DIAGNOSIS — Z87891 Personal history of nicotine dependence: Secondary | ICD-10-CM

## 2015-10-28 DIAGNOSIS — F0280 Dementia in other diseases classified elsewhere without behavioral disturbance: Secondary | ICD-10-CM | POA: Diagnosis present

## 2015-10-28 DIAGNOSIS — Z950 Presence of cardiac pacemaker: Secondary | ICD-10-CM

## 2015-10-28 DIAGNOSIS — F419 Anxiety disorder, unspecified: Secondary | ICD-10-CM | POA: Diagnosis present

## 2015-10-28 DIAGNOSIS — R791 Abnormal coagulation profile: Secondary | ICD-10-CM | POA: Diagnosis not present

## 2015-10-28 DIAGNOSIS — Z952 Presence of prosthetic heart valve: Secondary | ICD-10-CM

## 2015-10-28 DIAGNOSIS — Z66 Do not resuscitate: Secondary | ICD-10-CM | POA: Diagnosis present

## 2015-10-28 DIAGNOSIS — R4182 Altered mental status, unspecified: Secondary | ICD-10-CM

## 2015-10-28 DIAGNOSIS — R52 Pain, unspecified: Secondary | ICD-10-CM

## 2015-10-28 DIAGNOSIS — Z7901 Long term (current) use of anticoagulants: Secondary | ICD-10-CM

## 2015-10-28 DIAGNOSIS — Z794 Long term (current) use of insulin: Secondary | ICD-10-CM

## 2015-10-28 DIAGNOSIS — Z9981 Dependence on supplemental oxygen: Secondary | ICD-10-CM

## 2015-10-28 DIAGNOSIS — F0150 Vascular dementia without behavioral disturbance: Secondary | ICD-10-CM | POA: Diagnosis present

## 2015-10-28 DIAGNOSIS — Z79899 Other long term (current) drug therapy: Secondary | ICD-10-CM

## 2015-10-28 DIAGNOSIS — G4733 Obstructive sleep apnea (adult) (pediatric): Secondary | ICD-10-CM | POA: Diagnosis present

## 2015-10-28 DIAGNOSIS — Z881 Allergy status to other antibiotic agents status: Secondary | ICD-10-CM

## 2015-10-28 DIAGNOSIS — I480 Paroxysmal atrial fibrillation: Secondary | ICD-10-CM | POA: Diagnosis not present

## 2015-10-28 DIAGNOSIS — I25810 Atherosclerosis of coronary artery bypass graft(s) without angina pectoris: Secondary | ICD-10-CM | POA: Diagnosis not present

## 2015-10-28 DIAGNOSIS — I2510 Atherosclerotic heart disease of native coronary artery without angina pectoris: Secondary | ICD-10-CM | POA: Diagnosis present

## 2015-10-28 DIAGNOSIS — R410 Disorientation, unspecified: Secondary | ICD-10-CM | POA: Diagnosis not present

## 2015-10-28 DIAGNOSIS — E785 Hyperlipidemia, unspecified: Secondary | ICD-10-CM | POA: Diagnosis present

## 2015-10-28 DIAGNOSIS — J449 Chronic obstructive pulmonary disease, unspecified: Secondary | ICD-10-CM | POA: Diagnosis present

## 2015-10-28 DIAGNOSIS — N390 Urinary tract infection, site not specified: Secondary | ICD-10-CM | POA: Diagnosis not present

## 2015-10-28 DIAGNOSIS — Z951 Presence of aortocoronary bypass graft: Secondary | ICD-10-CM

## 2015-10-28 DIAGNOSIS — I10 Essential (primary) hypertension: Secondary | ICD-10-CM | POA: Diagnosis present

## 2015-10-28 DIAGNOSIS — F329 Major depressive disorder, single episode, unspecified: Secondary | ICD-10-CM | POA: Diagnosis present

## 2015-10-28 DIAGNOSIS — G9340 Encephalopathy, unspecified: Secondary | ICD-10-CM | POA: Diagnosis not present

## 2015-10-28 DIAGNOSIS — E119 Type 2 diabetes mellitus without complications: Secondary | ICD-10-CM | POA: Diagnosis present

## 2015-10-28 DIAGNOSIS — G9341 Metabolic encephalopathy: Secondary | ICD-10-CM | POA: Diagnosis present

## 2015-10-28 DIAGNOSIS — G931 Anoxic brain damage, not elsewhere classified: Secondary | ICD-10-CM

## 2015-10-28 DIAGNOSIS — J438 Other emphysema: Secondary | ICD-10-CM

## 2015-10-28 DIAGNOSIS — Z8249 Family history of ischemic heart disease and other diseases of the circulatory system: Secondary | ICD-10-CM

## 2015-10-28 DIAGNOSIS — W19XXXA Unspecified fall, initial encounter: Secondary | ICD-10-CM | POA: Diagnosis present

## 2015-10-28 LAB — GLUCOSE, CAPILLARY: Glucose-Capillary: 185 mg/dL — ABNORMAL HIGH (ref 65–99)

## 2015-10-28 LAB — COMPREHENSIVE METABOLIC PANEL
ALK PHOS: 74 U/L (ref 38–126)
ALT: 27 U/L (ref 17–63)
ANION GAP: 11 (ref 5–15)
AST: 25 U/L (ref 15–41)
Albumin: 3.4 g/dL — ABNORMAL LOW (ref 3.5–5.0)
BUN: 20 mg/dL (ref 6–20)
CALCIUM: 8.9 mg/dL (ref 8.9–10.3)
CO2: 26 mmol/L (ref 22–32)
Chloride: 103 mmol/L (ref 101–111)
Creatinine, Ser: 0.79 mg/dL (ref 0.61–1.24)
GFR calc Af Amer: >60 mL/min (ref >60)
GLUCOSE: 157 mg/dL — AB (ref 65–99)
Potassium: 3.9 mmol/L (ref 3.5–5.1)
Sodium: 140 mmol/L (ref 135–145)
TOTAL PROTEIN: 7.2 g/dL (ref 6.5–8.1)
Total Bilirubin: 0.8 mg/dL (ref 0.3–1.2)

## 2015-10-28 LAB — CBC
HCT: 43.6 % (ref 39.0–52.0)
HEMOGLOBIN: 14.2 g/dL (ref 13.0–17.0)
MCH: 28.4 pg (ref 26.0–34.0)
MCHC: 32.6 g/dL (ref 30.0–36.0)
MCV: 87.2 fL (ref 78.0–100.0)
Platelets: 310 10*3/uL (ref 150–400)
RBC: 5 MIL/uL (ref 4.22–5.81)
RDW: 13.7 % (ref 11.5–15.5)
WBC: 9.9 10*3/uL (ref 4.0–10.5)

## 2015-10-28 LAB — URINALYSIS, ROUTINE W REFLEX MICROSCOPIC
GLUCOSE, UA: NEGATIVE mg/dL
HGB URINE DIPSTICK: NEGATIVE
KETONES UR: 15 mg/dL — AB
Nitrite: NEGATIVE
PH: 6.5 (ref 5.0–8.0)
Protein, ur: NEGATIVE mg/dL
Specific Gravity, Urine: 1.024 (ref 1.005–1.030)

## 2015-10-28 LAB — CBG MONITORING, ED: GLUCOSE-CAPILLARY: 157 mg/dL — AB (ref 65–99)

## 2015-10-28 LAB — URINE MICROSCOPIC-ADD ON: RBC / HPF: NONE SEEN RBC/hpf (ref 0–5)

## 2015-10-28 LAB — PROTIME-INR: INR: >10 (ref 0.00–1.49)

## 2015-10-28 MED ORDER — VENLAFAXINE HCL 75 MG PO TABS
75.0000 mg | ORAL_TABLET | Freq: Two times a day (BID) | ORAL | Status: DC
  Filled 2015-10-28 (×2): qty 2

## 2015-10-28 MED ORDER — LOSARTAN POTASSIUM 50 MG PO TABS
100.0000 mg | ORAL_TABLET | Freq: Every day | ORAL | Status: DC
  Administered 2015-10-29 – 2015-11-02 (×5): 100 mg via ORAL
  Filled 2015-10-28 (×5): qty 2

## 2015-10-28 MED ORDER — IBUPROFEN 200 MG PO TABS
200.0000 mg | ORAL_TABLET | Freq: Every day | ORAL | Status: DC | PRN
  Administered 2015-10-28 – 2015-10-30 (×3): 200 mg via ORAL
  Filled 2015-10-28 (×3): qty 1

## 2015-10-28 MED ORDER — WARFARIN - PHARMACIST DOSING INPATIENT: Freq: Every day | Status: DC

## 2015-10-28 MED ORDER — ADULT MULTIVITAMIN W/MINERALS CH
1.0000 | ORAL_TABLET | Freq: Every day | ORAL | Status: DC
  Administered 2015-10-29 – 2015-11-02 (×5): 1 via ORAL
  Filled 2015-10-28 (×5): qty 1

## 2015-10-28 MED ORDER — MEMANTINE HCL 5 MG PO TABS
10.0000 mg | ORAL_TABLET | Freq: Two times a day (BID) | ORAL | Status: DC
  Administered 2015-10-28 – 2015-11-02 (×10): 10 mg via ORAL
  Filled 2015-10-28 (×10): qty 2

## 2015-10-28 MED ORDER — ALBUTEROL SULFATE (2.5 MG/3ML) 0.083% IN NEBU
3.0000 mL | INHALATION_SOLUTION | Freq: Four times a day (QID) | RESPIRATORY_TRACT | Status: DC | PRN
Start: 2015-10-28 — End: 2015-11-02

## 2015-10-28 MED ORDER — SENNOSIDES-DOCUSATE SODIUM 8.6-50 MG PO TABS
2.0000 | ORAL_TABLET | Freq: Two times a day (BID) | ORAL | Status: DC
  Administered 2015-10-28 – 2015-11-02 (×8): 2 via ORAL
  Filled 2015-10-28 (×9): qty 2

## 2015-10-28 MED ORDER — INSULIN ASPART 100 UNIT/ML ~~LOC~~ SOLN
0.0000 [IU] | Freq: Three times a day (TID) | SUBCUTANEOUS | Status: DC
  Administered 2015-10-29 – 2015-10-30 (×3): 1 [IU] via SUBCUTANEOUS
  Administered 2015-10-30 – 2015-10-31 (×2): 2 [IU] via SUBCUTANEOUS
  Administered 2015-10-31 – 2015-11-01 (×2): 1 [IU] via SUBCUTANEOUS
  Administered 2015-11-01 (×2): 2 [IU] via SUBCUTANEOUS
  Administered 2015-11-02: 1 [IU] via SUBCUTANEOUS

## 2015-10-28 MED ORDER — MOMETASONE FURO-FORMOTEROL FUM 200-5 MCG/ACT IN AERO
2.0000 | INHALATION_SPRAY | Freq: Two times a day (BID) | RESPIRATORY_TRACT | Status: DC
  Administered 2015-10-29 – 2015-11-02 (×8): 2 via RESPIRATORY_TRACT
  Filled 2015-10-28 (×2): qty 8.8

## 2015-10-28 MED ORDER — METFORMIN HCL ER 500 MG PO TB24: 1000.0000 mg | ORAL_TABLET | Freq: Every day | ORAL | Status: DC

## 2015-10-28 MED ORDER — METOPROLOL TARTRATE 25 MG PO TABS
25.0000 mg | ORAL_TABLET | Freq: Two times a day (BID) | ORAL | Status: DC
  Administered 2015-10-28 – 2015-11-02 (×10): 25 mg via ORAL
  Filled 2015-10-28 (×10): qty 1

## 2015-10-28 MED ORDER — VITAMIN K1 10 MG/ML IJ SOLN
5.0000 mg | Freq: Once | INTRAVENOUS | Status: AC
  Administered 2015-10-28: 5 mg via INTRAVENOUS
  Filled 2015-10-28: qty 0.5

## 2015-10-28 MED ORDER — MELATONIN 3 MG PO TABS
3.0000 mg | ORAL_TABLET | Freq: Every day | ORAL | Status: DC
  Administered 2015-10-28 – 2015-11-01 (×5): 3 mg via ORAL
  Filled 2015-10-28 (×6): qty 1

## 2015-10-28 MED ORDER — CILOSTAZOL 50 MG PO TABS
50.0000 mg | ORAL_TABLET | Freq: Two times a day (BID) | ORAL | Status: DC
  Administered 2015-10-28 – 2015-11-02 (×10): 50 mg via ORAL
  Filled 2015-10-28 (×10): qty 1

## 2015-10-28 MED ORDER — INSULIN GLARGINE 100 UNIT/ML ~~LOC~~ SOLN
20.0000 [IU] | Freq: Every day | SUBCUTANEOUS | Status: DC
  Administered 2015-10-28 – 2015-11-01 (×5): 20 [IU] via SUBCUTANEOUS
  Filled 2015-10-28 (×6): qty 0.2

## 2015-10-28 MED ORDER — DEXTROSE 5 % IV SOLN
1.0000 g | INTRAVENOUS | Status: DC
  Administered 2015-10-28 – 2015-10-30 (×3): 1 g via INTRAVENOUS
  Filled 2015-10-28 (×4): qty 10

## 2015-10-28 MED ORDER — TAMSULOSIN HCL 0.4 MG PO CAPS
0.4000 mg | ORAL_CAPSULE | Freq: Every day | ORAL | Status: DC
  Administered 2015-10-28 – 2015-11-01 (×5): 0.4 mg via ORAL
  Filled 2015-10-28 (×5): qty 1

## 2015-10-28 NOTE — Consult Note (Signed)
Requesting Physician: Dr. Gardiner Rhyme    Chief Complaint: AMS  History obtained from:  Patient  And wife  HPI:                                                                                                                                         ANUEL SITTER, [name] is an 81 y.o. male on Coumadin with a Pacemaker, and known vascular dementia who has failed Exelon patch and Aricept. patient was noted to have a significant mental decline 6 days ago with fairly acute onset. Wife noted he was not talking as well, generalized weakness, incontinence, Dark urine, fall after urinating on the floor and poor oral intake. Patient is not a good historian but states he has urinary urgency and feeling he is not completely voiding all the urine. Thus far no UA or CT has been obtained. Neurology asked to evaluate.   Date last known well: 10/23/2015 Time last known well: Unable to determine tPA Given: No: out of window   Past Medical History  Diagnosis Date  . Hypertension   . Hyperlipidemia   . Type II or unspecified type diabetes mellitus without mention of complication, not stated as uncontrolled   . Nephrolithiasis   . Atrial flutter [facility])     s/p CTI ablation by Dr Ladona Ridgel 1/12  . CAD (coronary artery disease)     s/p CABG x 5 2003 by Dr Laneta Simmers  . Aortic stenosis, moderate     moderate to severe per echo 03/2012 with AVR in March of 2014  . DJD (degenerative joint disease)   . DDD (degenerative disc disease)   . Spinal stenosis of lumbar region   . Bifascicular block   . Atrial tachycardia (HCC)   . Anxiety   . Depression   . S/P AVR (aortic valve replacement)   . OSA (obstructive sleep apnea)   . 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 Diverticulosis   . Hemorrhoids   . Complete heart block (HCC) 08/21/15    [facility] PPM, Dr. Johney Frame    Past Surgical History  Procedure Laterality Date  . Cabg  2003    by Dr Laneta Simmers  . Anal fissure repair    . Leg surgery      right  .  Trigger finger release      bilat.  . Tonsillectomy    . Back surgery  2010  . Atrial ablation surgery  1/12    atrial flutter ablation by Dr Ladona Ridgel  . Cardioversion  04/07/2012    Procedure: CARDIOVERSION;  Surgeon: Wendall Stade, MD;  Location: [facility] ENDOSCOPY;  Service: Cardiovascular;  Laterality: N/A;  . Coronary artery bypass graft      10 yrs ago   03  . Aortic valve replacement N/A 09/01/2012    Procedure: AORTIC VALVE REPLACEMENT (AVR);  Surgeon: Alleen Borne, MD;  Location: [facility] OR;  Service: Open Heart Surgery;  Laterality: N/A;  . Intraoperative transesophageal echocardiogram N/A 09/01/2012    Procedure: INTRAOPERATIVE TRANSESOPHAGEAL ECHOCARDIOGRAM;  Surgeon: Alleen Borne, MD;  Location: [facility] OR;  Service: Open Heart Surgery;  Laterality: N/A;  . Ep implantable device N/A 08/21/2015    STJ PPM, Dr. Johney Frame    Family History  Problem Relation Age of Onset  . Heart disease Father   . Heart disease Paternal Grandfather   . Stroke Mother   . Rheum arthritis Mother   . Colon cancer Neg Hx   . Stomach cancer Neg Hx    Social History:  reports that he quit smoking about 14 years ago. He has never used smokeless tobacco. He reports that he does not drink alcohol or use illicit drugs.  Allergies:  Allergies  Allergen Reactions  . Doxycycline Swelling    swollen tongue     Medications:                                                                                                                           No current facility-administered medications for this encounter.   Current Outpatient Prescriptions  Medication Sig Dispense Refill  . albuterol (PROVENTIL HFA;VENTOLIN HFA) 108 (90 BASE) MCG/ACT inhaler Inhale 2 puffs into the lungs every 6 (six) hours as needed for wheezing. 1 Inhaler 6  . aspirin EC 325 MG EC tablet Take 1 tablet (325 mg total) by mouth daily. 30 tablet   . budesonide-formoterol (SYMBICORT) 160-4.5 MCG/ACT inhaler Inhale 2 puffs into the lungs 2 (two)  times daily. (Patient taking differently: Inhale 2 puffs into the lungs daily. ) 1 Inhaler 5  . cilostazol (PLETAL) 50 MG tablet Take 50 mg by mouth 2 (two) times daily.    . fluticasone (FLONASE) 50 MCG/ACT nasal spray Place 1-2 sprays into both nostrils daily.     . insulin glargine (LANTUS) 100 UNIT/ML injection Inject 20 Units into the skin at bedtime.     . insulin lispro (HUMALOG) 100 UNIT/ML injection Inject 5 Units into the skin as needed for high blood sugar.    [name] LORazepam (ATIVAN) 1 MG tablet Take 1 mg by mouth 3 (three) times daily as needed for anxiety. Reported on 09/04/2015  1  . losartan (COZAAR) 100 MG tablet Take 1 tablet (100 mg total) by mouth daily. 90 tablet 3  . Melatonin 3 MG CAPS Take 3 mg by mouth daily. Reported on 09/04/2015    . memantine (NAMENDA) 10 MG tablet Take 1 tablet (10 mg total) by mouth 2 (two) times daily. 180 tablet 1  . METFORMIN HCL PO Take 2 tablets by mouth at bedtime.    . metoprolol tartrate (LOPRESSOR) 25 MG tablet Take 1 tablet (25 mg total) by mouth 2 (two) times daily. 60 tablet 3  . Multiple Vitamins-Minerals (MULTIVITAMIN WITH MINERALS) tablet Take 1 tablet by mouth daily.    [name] NITROSTAT 0.4 MG SL tablet Place 0.4 mg under the tongue  every 5 (five) minutes as needed for chest pain (up to 3 times).     . NON FORMULARY Place 2 L into the nose at bedtime.    . Tamsulosin HCl (FLOMAX) 0.4 MG CAPS Take 0.4 mg by mouth at bedtime.     [name]. venlafaxine (EFFEXOR) 75 MG tablet Take 75-150 mg by mouth 2 (two) times daily with a meal. Take 150mg  in the morning and 75mg  at bedtime.    [name]. warfarin (COUMADIN) 5 MG tablet Take as directed by coumadin clinic (Patient taking differently: Take 5-7.5 mg by mouth daily at 6 PM. 7.5 mg Mon, Wed, and Sunday 5 mg all other days) 150 tablet 1  . zolpidem (AMBIEN) 5 MG tablet Take 5 mg by mouth at bedtime. Reported on 09/04/2015       ROS:                                                                                                                                        History obtained from the patient  General ROS: negative for - chills, fatigue, fever, night sweats, weight gain or weight loss Psychological ROS: negative for - behavioral disorder, hallucinations, memory difficulties, mood swings or suicidal ideation Ophthalmic ROS: negative for - blurry vision, double vision, eye pain or loss of vision ENT ROS: negative for - epistaxis, nasal discharge, oral lesions, sore throat, tinnitus or vertigo Allergy and Immunology ROS: negative for - hives or itchy/watery eyes Hematological and Lymphatic ROS: negative for - bleeding problems, bruising or swollen lymph nodes Endocrine ROS: negative for - galactorrhea, hair pattern changes, polydipsia/polyuria or temperature intolerance Respiratory ROS: negative for - cough, hemoptysis, shortness of breath or wheezing Cardiovascular ROS: negative for - chest pain, dyspnea on exertion, edema or irregular heartbeat Gastrointestinal ROS: negative for - abdominal pain, diarrhea, hematemesis, nausea/vomiting or stool incontinence Genito-Urinary ROS: negative for - dysuria, hematuria, incontinence or urinary frequency/urgency Musculoskeletal ROS: negative for - joint swelling or muscular weakness Neurological ROS: as noted in HPI Dermatological ROS: negative for rash and skin lesion changes  Neurologic Examination:                                                                                                      Blood pressure 125/76, pulse 83, temperature 98.2 F (36.8 C), temperature source Oral, resp. rate 18, SpO2 94 %.  HEENT-  Normocephalic, no lesions, without obvious abnormality.  Normal external eye and conjunctiva.  Normal TM's bilaterally.  Normal auditory  canals and external ears. Normal external nose, mucus membranes and septum.  Normal pharynx. Cardiovascular- irregularly irregular rhythm, pulses palpable throughout   Lungs- chest clear, no wheezing, rales,  normal symmetric air entry Abdomen- normal findings: bowel sounds normal Extremities- no edema Lymph-no adenopathy palpable Musculoskeletal-no joint tenderness, deformity or swelling Skin-warm and dry, no hyperpigmentation, vitiligo, or suspicious lesions  Neurological Examination Mental Status: Alert, oriented hospital only.  Speech fluent without evidence of aphasia.  Able to follow simple commands commands with delayed response and at time needed multiple  Verbal and visual cues. Cranial Nerves: II: Discs flat bilaterally; Visual fields grossly normal, pupils equal, round, reactive to light and accommodation III,IV, VI: ptosis not present, extra-ocular motions intact bilaterally V,VII: smile asymmetric on the left, facial light touch sensation normal bilaterally VIII: hearing normal bilaterally IX,X: uvula rises symmetrically XI: bilateral shoulder shrug XII: midline tongue extension Motor: 4/5 throughout with bradykinesia and left UE pronation.  Sensory: Pinprick and light touch intact throughout, bilaterally Deep Tendon Reflexes: 1+ and symmetric throughout UE and no LE DTR's Plantars: Mute bilaterally Cerebellar: normal finger-to-nose unable to do H-S due to unable to understand what was asked Gait: not tested       Lab Results: Basic Metabolic Panel:  Recent Labs Lab 10/28/15 1311  NA 140  K 3.9  CL 103  CO2 26  GLUCOSE 157*  BUN 20  CREATININE 0.79  CALCIUM 8.9    Liver Function Tests:  Recent Labs Lab 10/28/15 1311  AST 25  ALT 27  ALKPHOS 74  BILITOT 0.8  PROT 7.2  ALBUMIN 3.4*   No results for input(s): LIPASE, AMYLASE in the last 168 hours. No results for input(s): AMMONIA in the last 168 hours.  CBC:  Recent Labs Lab 10/28/15 1311  WBC 9.9  HGB 14.2  HCT 43.6  MCV 87.2  PLT 310    Cardiac Enzymes: No results for input(s): CKTOTAL, CKMB, CKMBINDEX, TROPONINI in the last 168 hours.  Lipid Panel: No results for input(s): CHOL,  TRIG, HDL, CHOLHDL, VLDL, LDLCALC in the last 168 hours.  CBG:  Recent Labs Lab 10/28/15 1307  GLUCAP 157*    Microbiology: Results for orders placed or performed during the hospital encounter of 08/21/15  Surgical pcr screen     Status: Abnormal   Collection Time: 08/21/15  9:49 AM  Result Value Ref Range Status   MRSA, PCR NEGATIVE NEGATIVE Final   Staphylococcus aureus POSITIVE (A) NEGATIVE Final    Comment:        The Xpert SA Assay (FDA approved for NASAL specimens in patients over 84 years of age), is one component of a comprehensive surveillance program.  Test performance has been validated by [facility] for patients greater than or equal to 43 year old. It is not intended to diagnose infection nor to guide or monitor treatment.     Coagulation Studies:  Recent Labs  10/28/15 1311  LABPROT >90.0*  INR >10.00*    Imaging: No results found.     Assessment and plan discussed with with attending physician and they are in agreement.    Felicie Morn PA-C [facility] [phone]  10/28/2015, 2:46 PM   Assessment: 81 y.o. male with acute onset of AMS in setting of generalized weakness, incontinence, Dark urine, fall after urinating on the floor and poor oral intake. Exam is notable for left facial droop and left arm drift. Concern for SDH after fall versus Toxic/metabolic encephalopathy. The subacute onset makes stroke less likely  though possible. CT and UA Pending.  Stroke Risk Factors - atrial fibrillation, diabetes mellitus, hyperlipidemia and hypertension   1) CT head  2) UA, CXR, infectious screen 3) further recommendations following above.   Ritta Slot, MD [facility] [phone]  If 7pm- 7am, please page neurology on call as listed in AMION.

## 2015-10-28 NOTE — ED Notes (Signed)
Attempted report 

## 2015-10-28 NOTE — Progress Notes (Signed)
ANTICOAGULATION CONSULT NOTE - Initial Consult  Pharmacy Consult for Warfarin Indication: mechanical valve  Allergies  Allergen Reactions  . Doxycycline Swelling    swollen tongue     Patient Measurements:   Heparin Dosing Weight:   Vital Signs: Temp: 98.2 F (36.8 C) (05/08 1257) Temp Source: Oral (05/08 1257) BP: 125/76 mmHg (05/08 1257) Pulse Rate: 83 (05/08 1257)  Labs:  Recent Labs  10/28/15 1311  HGB 14.2  HCT 43.6  PLT 310  LABPROT >90.0*  INR >10.00*  CREATININE 0.79    CrCl cannot be calculated (Unknown ideal weight.).   Medical History: Past Medical History  Diagnosis Date  . Hypertension   . Hyperlipidemia   . Type II or unspecified type diabetes mellitus without mention of complication, not stated as uncontrolled   . Nephrolithiasis   . Atrial flutter [facility](HCC)     s/p CTI ablation by Dr Ladona Ridgelaylor 1/12  . CAD (coronary artery disease)     s/p CABG x 5 2003 by Dr Laneta SimmersBartle  . Aortic stenosis, moderate     moderate to severe per echo 03/2012 with AVR in March of 2014  . DJD (degenerative joint disease)   . DDD (degenerative disc disease)   . Spinal stenosis of lumbar region   . Bifascicular block   . Atrial tachycardia (HCC)   . Anxiety   . Depression   . S/P AVR (aortic valve replacement)   . OSA (obstructive sleep apnea)   . 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 Diverticulosis   . Hemorrhoids   . Complete heart block (HCC) 08/21/15    STJ PPM, Dr. Johney FrameAllred    Medications:   (Not in a hospital admission) Scheduled:  . [START ON 10/29/2015] insulin aspart  0-9 Units Subcutaneous TID WC  . senna-docusate  2 tablet Oral BID   Infusions:  . cefTRIAXone (ROCEPHIN)  IV      Assessment: 81yo male with history of Aflutter and aortic mechanical valve presents with AMS. Pharmacy is consulted to dose warfarin for mechanical valve. INR on presentation is > 10, CBC wnl, sCr 0.79. Pt was given vitamin K 5mg  IV once in the ED.  PTA  Warfarin Dose: 5mg  Mon/Fri and 7.5mg  AODs  Goal of Therapy:  INR 2-3 Monitor platelets by anticoagulation protocol: Yes   Plan:  Hold warfarin tonight Daily INR/CBC Monitor s/sx of bleeding  Arlean Hoppingorey M. Newman PiesBall, PharmD, BCPS Clinical Pharmacist Pager [phone] 10/28/2015,5:30 PM

## 2015-10-28 NOTE — ED Notes (Signed)
Pt wife states the patient has had a marked change in mental status, confusion, dark urine with odor, change in appetite, now incontinent.  Pt's wife states symptoms started last Wednesday.  Pt knows self and that he is at a hospital.  He follows simple directions, grip strengths weak bilateral.  Wife states patient fell on Saturday from incontinent urine and has been complaining of back pain since

## 2015-10-28 NOTE — ED Provider Notes (Signed)
CSN: [id number]     Arrival date & time 10/28/15  1201 History   First MD Initiated Contact with Patient 10/28/15 1335     Chief Complaint  Patient presents with  . Altered Mental Status  . Aphasia     (Consider location/radiation/quality/duration/timing/severity/associated sxs/prior Treatment) HPI Comments: Patient with diabetes, atrial flutter, aortic stenosis with valve repair, Coumadin use, dementia, heart block presents with altered mental status since Wednesday. Wife noticed significant decline since Wednesday including a change in speech, general weakness. No witnessed head injury. No fevers or chills. No respiratory symptoms. Patient's wife has had to dress him and base him which is abnormal. Patient was mowing the grass last week.  Patient is a 81 y.o. male presenting with altered mental status. The history is provided by the patient and the spouse.  Altered Mental Status   Past Medical History  Diagnosis Date  . Hypertension   . Hyperlipidemia   . Type II or unspecified type diabetes mellitus without mention of complication, not stated as uncontrolled   . Nephrolithiasis   . Atrial flutter [facility])     s/p CTI ablation by Dr Ladona Ridgel 1/12  . CAD (coronary artery disease)     s/p CABG x 5 2003 by Dr Laneta Simmers  . Aortic stenosis, moderate     moderate to severe per echo 03/2012 with AVR in March of 2014  . DJD (degenerative joint disease)   . DDD (degenerative disc disease)   . Spinal stenosis of lumbar region   . Bifascicular block   . Atrial tachycardia (HCC)   . Anxiety   . Depression   . S/P AVR (aortic valve replacement)   . OSA (obstructive sleep apnea)   . 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 Diverticulosis   . Hemorrhoids   . Complete heart block (HCC) 08/21/15    STJ PPM, Dr. Johney Frame   Past Surgical History  Procedure Laterality Date  . Cabg  2003    by Dr Laneta Simmers  . Anal fissure repair    . Leg surgery      right  . Trigger finger  release      bilat.  . Tonsillectomy    . Back surgery  2010  . Atrial ablation surgery  1/12    atrial flutter ablation by Dr Ladona Ridgel  . Cardioversion  04/07/2012    Procedure: CARDIOVERSION;  Surgeon: Wendall Stade, MD;  Location: [facility] ENDOSCOPY;  Service: Cardiovascular;  Laterality: N/A;  . Coronary artery bypass graft      10 yrs ago   03  . Aortic valve replacement N/A 09/01/2012    Procedure: AORTIC VALVE REPLACEMENT (AVR);  Surgeon: Alleen Borne, MD;  Location: [facility] OR;  Service: Open Heart Surgery;  Laterality: N/A;  . Intraoperative transesophageal echocardiogram N/A 09/01/2012    Procedure: INTRAOPERATIVE TRANSESOPHAGEAL ECHOCARDIOGRAM;  Surgeon: Alleen Borne, MD;  Location: [facility] OR;  Service: Open Heart Surgery;  Laterality: N/A;  . Ep implantable device N/A 08/21/2015    STJ PPM, Dr. Johney Frame   Family History  Problem Relation Age of Onset  . Heart disease Father   . Heart disease Paternal Grandfather   . Stroke Mother   . Rheum arthritis Mother   . Colon cancer Neg Hx   . Stomach cancer Neg Hx    Social History  Substance Use Topics  . Smoking status: Former Smoker -- 1.00 packs/day for 49 years    Quit date: 08/21/2001  . Smokeless tobacco: Never  Used     Comment: started at age 81.   [name]. Alcohol Use: No     Comment: quit 15 yrs ago    Review of Systems  Unable to perform ROS: Mental status change      Allergies  Doxycycline  Home Medications   Prior to Admission medications   Medication Sig Start Date End Date Taking? Authorizing Provider  albuterol (PROVENTIL HFA;VENTOLIN HFA) 108 (90 BASE) MCG/ACT inhaler Inhale 2 puffs into the lungs every 6 (six) hours as needed for wheezing. 08/09/13  Yes Barbaraann ShareKeith M Clance, MD  aspirin EC 325 MG EC tablet Take 1 tablet (325 mg total) by mouth daily. Patient taking differently: Take 325 mg by mouth at bedtime.  09/06/12  Yes Donielle Margaretann LovelessM Zimmerman, PA-C  budesonide-formoterol (SYMBICORT) 160-4.5 MCG/ACT inhaler Inhale 2 puffs  into the lungs 2 (two) times daily. Patient taking differently: Inhale 2 puffs into the lungs daily as needed (shortness of breath/ wheezing).  03/08/14  Yes Barbaraann ShareKeith M Clance, MD  cilostazol (PLETAL) 50 MG tablet Take 50 mg by mouth 2 (two) times daily.   Yes Historical Provider, MD  ibuprofen (ADVIL,MOTRIN) 200 MG tablet Take 200 mg by mouth daily as needed (pain).   Yes Historical Provider, MD  insulin glargine (LANTUS) 100 unit/mL SOPN Inject 20 Units into the skin at bedtime.   Yes Historical Provider, MD  losartan (COZAAR) 100 MG tablet Take 1 tablet (100 mg total) by mouth daily. 08/14/15  Yes Hillis RangeJames Allred, MD  Melatonin 3 MG CAPS Take 3 mg by mouth at bedtime. Reported on 09/04/2015   Yes Historical Provider, MD  memantine (NAMENDA) 10 MG tablet Take 1 tablet (10 mg total) by mouth 2 (two) times daily. 04/11/15  Yes Carmen Dohmeier, MD  metFORMIN (GLUCOPHAGE-XR) 500 MG 24 hr tablet Take 1,000 mg by mouth at bedtime. 10/26/15  Yes Historical Provider, MD  metoprolol tartrate (LOPRESSOR) 25 MG tablet Take 1 tablet (25 mg total) by mouth 2 (two) times daily. 09/05/15  Yes Hillis RangeJames Allred, MD  Multiple Vitamins-Minerals (MULTIVITAMIN WITH MINERALS) tablet Take 1 tablet by mouth daily.   Yes Historical Provider, MD  nitroGLYCERIN (NITROSTAT) 0.4 MG SL tablet Place 0.4 mg under the tongue every 5 (five) minutes as needed for chest pain.   Yes Historical Provider, MD  OXYGEN Inhale into the lungs at bedtime.   Yes Historical Provider, MD  Tamsulosin HCl (FLOMAX) 0.4 MG CAPS Take 0.4 mg by mouth at bedtime.  03/23/11  Yes Historical Provider, MD  venlafaxine (EFFEXOR) 75 MG tablet Take 75-150 mg by mouth 2 (two) times daily with a meal. Take 2 tablets (150 mg) by mouth daily with breakfast and 1 tablet (75 mg) daily with supper   Yes Historical Provider, MD  warfarin (COUMADIN) 5 MG tablet Take as directed by coumadin clinic Patient taking differently: Take 5-7.5 mg by mouth daily at 6 PM. Take 1 tablet (5 mg) by  mouth on Mondays and Fridays, take 1 1/2 tablets (7.5 mg) on Sunday, Tuesday, Wednesday, Thursday, Saturday 07/30/15  Yes Hillis RangeJames Allred, MD  insulin lispro (HUMALOG KWIKPEN) 100 UNIT/ML KiwkPen Inject 5 Units into the skin daily with supper.    Historical Provider, MD   BP 146/71 mmHg  Pulse 99  Temp(Src) 97.1 F (36.2 C) (Axillary)  Resp 18  Wt 160 lb 14.4 oz (72.984 kg)  SpO2 100% Physical Exam  Constitutional: He appears well-developed and well-nourished.  HENT:  Head: Normocephalic and atraumatic.  Dry mucous membranes  Eyes:  Right eye exhibits no discharge. Left eye exhibits no discharge.  Neck: Normal range of motion. Neck supple. No tracheal deviation present.  Cardiovascular: Normal rate and regular rhythm.   Pulmonary/Chest: Effort normal and breath sounds normal.  Abdominal: Soft. He exhibits no distension. There is no tenderness. There is no guarding.  Musculoskeletal: He exhibits no edema.  Neurological: He is alert. GCS eye subscore is 4. GCS verbal subscore is 4. GCS motor subscore is 6.  Patient knows name and location. Patient follows simple commands. Patient generally weak and mild slowness to respond to specific commands. Speech mild slurred. Flat affect. Patient has equal strength upper lower extremity's. Pain to palpation intact bilateral.   Skin: Skin is warm. No rash noted.  Psychiatric:  Flat affect, minimal spontaneous discussion  Nursing note and vitals reviewed.   ED Course  Procedures (including critical care time) Labs Review Labs Reviewed  COMPREHENSIVE METABOLIC PANEL - Abnormal; Notable for the following:    Glucose, Bld 157 (*)    Albumin 3.4 (*)    All other components within normal limits  PROTIME-INR - Abnormal; Notable for the following:    Prothrombin Time >90.0 (*)    INR >10.00 (*)    All other components within normal limits  URINALYSIS, ROUTINE W REFLEX MICROSCOPIC (NOT AT [facility]) - Abnormal; Notable for the following:    Color, Urine  AMBER (*)    Bilirubin Urine SMALL (*)    Ketones, ur 15 (*)    Leukocytes, UA TRACE (*)    All other components within normal limits  URINE MICROSCOPIC-ADD ON - Abnormal; Notable for the following:    Squamous Epithelial / LPF 0-5 (*)    Bacteria, UA RARE (*)    All other components within normal limits  LIPID PANEL - Abnormal; Notable for the following:    HDL 31 (*)    All other components within normal limits  COMPREHENSIVE METABOLIC PANEL - Abnormal; Notable for the following:    Potassium 3.3 (*)    Glucose, Bld 105 (*)    Calcium 8.6 (*)    Total Protein 6.3 (*)    Albumin 3.1 (*)    All other components within normal limits  PROTIME-INR - Abnormal; Notable for the following:    Prothrombin Time 17.2 (*)    All other components within normal limits  GLUCOSE, CAPILLARY - Abnormal; Notable for the following:    Glucose-Capillary 185 (*)    All other components within normal limits  GLUCOSE, CAPILLARY - Abnormal; Notable for the following:    Glucose-Capillary 108 (*)    All other components within normal limits  GLUCOSE, CAPILLARY - Abnormal; Notable for the following:    Glucose-Capillary 144 (*)    All other components within normal limits  GLUCOSE, CAPILLARY - Abnormal; Notable for the following:    Glucose-Capillary 128 (*)    All other components within normal limits  CBG MONITORING, ED - Abnormal; Notable for the following:    Glucose-Capillary 157 (*)    All other components within normal limits  URINE CULTURE  CBC  CBC  PHOSPHORUS  MAGNESIUM  AMMONIA  RPR  VITAMIN B12  TSH  HEMOGLOBIN A1C  HEPARIN LEVEL (UNFRACTIONATED)  PROTIME-INR  PROTIME-INR  HEPARIN LEVEL (UNFRACTIONATED)  CBC    Imaging Review Dg Lumbar Spine 2-3 Views  10/28/2015  CLINICAL DATA:  Low back pain after fall on Saturday. EXAM: LUMBAR SPINE - 2-3 VIEW COMPARISON:  MRI 10/18/2008 FINDINGS: Normal alignment of the lumbar  spine. Multilevel degenerative endplate changes. The vertebral  body heights are maintained without a fracture. There is disc space narrowing at L1-L2. Atherosclerotic calcifications in the abdominal aorta. Again noted is mild retrolisthesis at L2-L3. IMPRESSION: No acute abnormality in lumbar spine. Electronically Signed   By: Richarda Overlie M.D.   On: 10/28/2015 18:31   Ct Head Wo Contrast  10/28/2015  CLINICAL DATA:  Increase in weakness over the last 6 days. EXAM: CT HEAD WITHOUT CONTRAST TECHNIQUE: Contiguous axial images were obtained from the base of the skull through the vertex without intravenous contrast. COMPARISON:  08/05/2015 FINDINGS: Chronic area of low attenuation in the periventricular white matter of the left parietal lobe is identified compatible with previous infarct. Mild diffuse low attenuation throughout the subcortical white matter is also noted compatible with chronic microvascular disease. No evidence for acute brain infarct, intracranial hemorrhage or mass. Prominence of the sulci and ventricles compatible with brain atrophy. The paranasal sinuses and mastoid air cells are clear. The calvarium is intact. IMPRESSION: 1. No acute intracranial abnormalities. 2. Chronic microvascular disease and brain atrophy. Electronically Signed   By: Signa Kell M.D.   On: 10/28/2015 15:41   I have personally reviewed and evaluated these images and lab results as part of my medical decision-making.   EKG Interpretation   Date/Time:  Monday Oct 28 2015 13:05:05 EDT Ventricular Rate:  84 PR Interval:  184 QRS Duration: 150 QT Interval:  426 QTC Calculation: 503 R Axis:   -74 Text Interpretation:  Atrial-sensed ventricular-paced rhythm Abnormal ECG  No acute changes No significant change since last tracing Confirmed by  Rhunette Croft, MD, Janey Genta [phone]) on 10/28/2015 4:39:39 PM      MDM   Final diagnoses:  Supratherapeutic INR  Encephalopathy acute   Patient presents with significant cognitive/neurologic decline since Wednesday. Patient's wife provides  most the details. Discussed differential diagnosis including stroke, head bleed with patient being on Coumadin, UTI, other. Patient has general weakness and mild confusion on exam. Discussed with neurology for consult. No fever or white blood cell count elevation. CT scan pending.  Pt care signed out to Dr. Rhunette Croft.       Blane Ohara, MD 10/29/15 [phone]

## 2015-10-28 NOTE — H&P (Signed)
History and Physical    Adam Passe, [name] ZOX:[id number] DOB: Mar 28, 1935 DOA: 10/28/2015  Referring MD/NP/PA: Rhunette Croft PCP: Garlan Fillers, MD  Outpatient Specialists: Cards, Dr. Johney Frame, Neuro Dr. Vickey Huger Patient coming from: Home  Chief Complaint: Acute encephalopathy  HPI: Adam Passe, [name] is a 81 y.o. male with medical history significant of age and fibrillation, hypertension, hyperlipidemia, status post pacemaker, coronary artery disease status post CABG in 2003, mild vascular dementia, is being brought to the emergency room by his wife for progressive confusion over the last 3-4 days. He has a history of underlying mild dementia, however he is very functioning otherwise. Last Wednesday afternoon she noticed somewhat sudden that he started to be more confused, forgot about how to dress himself and she felt like his speech is more slurred. She feels like this is happening over about a day or so. He continued to remain confused and get worse over the last few days, and she decided to bring him to the emergency room today. There are no reported fever or chills. She states that over the last few days he has been more incontinent needing to wear depends as well as she noticed that his urine is darker than normal. Patient himself is quite confused in the ED, and has no complaints. There are no reported chest pain or palpitations. She states that he has had a fall last weekend and he has been complaining of back pain since. Wife reports no new medication changes, no pain medications, he does not using any sleep agents other than melatonin. He recently discontinued his benzodiazepine however it's been several months ago  ED Course: In the ED patient's vital signs are stable, his lab workup showed normal renal function, normal CBC however showed an INR greater than 10. He underwent a CT scan of the brain which was unremarkable. His urinalysis shows rare bacteria, trace leukocytes and negative  nitrates.  Review of Systems: Unable to obtain review of system due to acute encephalopathy  Past Medical History  Diagnosis Date  . Hypertension   . Hyperlipidemia   . Type II or unspecified type diabetes mellitus without mention of complication, not stated as uncontrolled   . Nephrolithiasis   . Atrial flutter [facility])     s/p CTI ablation by Dr Ladona Ridgel 1/12  . CAD (coronary artery disease)     s/p CABG x 5 2003 by Dr Laneta Simmers  . Aortic stenosis, moderate     moderate to severe per echo 03/2012 with AVR in March of 2014  . DJD (degenerative joint disease)   . DDD (degenerative disc disease)   . Spinal stenosis of lumbar region   . Bifascicular block   . Atrial tachycardia (HCC)   . Anxiety   . Depression   . S/P AVR (aortic valve replacement)   . OSA (obstructive sleep apnea)   . 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 Diverticulosis   . Hemorrhoids   . Complete heart block (HCC) 08/21/15    STJ PPM, Dr. Johney Frame    Past Surgical History  Procedure Laterality Date  . Cabg  2003    by Dr Laneta Simmers  . Anal fissure repair    . Leg surgery      right  . Trigger finger release      bilat.  . Tonsillectomy    . Back surgery  2010  . Atrial ablation surgery  1/12    atrial flutter ablation by Dr Ladona Ridgel  . Cardioversion  04/07/2012  Procedure: CARDIOVERSION;  Surgeon: Wendall Stade, MD;  Location: [facility] ENDOSCOPY;  Service: Cardiovascular;  Laterality: N/A;  . Coronary artery bypass graft      10 yrs ago   03  . Aortic valve replacement N/A 09/01/2012    Procedure: AORTIC VALVE REPLACEMENT (AVR);  Surgeon: Alleen Borne, MD;  Location: [facility] OR;  Service: Open Heart Surgery;  Laterality: N/A;  . Intraoperative transesophageal echocardiogram N/A 09/01/2012    Procedure: INTRAOPERATIVE TRANSESOPHAGEAL ECHOCARDIOGRAM;  Surgeon: Alleen Borne, MD;  Location: [facility] OR;  Service: Open Heart Surgery;  Laterality: N/A;  . Ep implantable device N/A 08/21/2015    STJ PPM, Dr.  Johney Frame     reports that he quit smoking about 14 years ago. He has never used smokeless tobacco. He reports that he does not drink alcohol or use illicit drugs.  Allergies  Allergen Reactions  . Doxycycline Swelling    swollen tongue     Family History  Problem Relation Age of Onset  . Heart disease Father   . Heart disease Paternal Grandfather   . Stroke Mother   . Rheum arthritis Mother   . Colon cancer Neg Hx   . Stomach cancer Neg Hx     Prior to Admission medications   Medication Sig Start Date End Date Taking? Authorizing Provider  albuterol (PROVENTIL HFA;VENTOLIN HFA) 108 (90 BASE) MCG/ACT inhaler Inhale 2 puffs into the lungs every 6 (six) hours as needed for wheezing. 08/09/13  Yes Barbaraann Share, MD  aspirin EC 325 MG EC tablet Take 1 tablet (325 mg total) by mouth daily. Patient taking differently: Take 325 mg by mouth at bedtime.  09/06/12  Yes Donielle Margaretann Loveless, PA-C  budesonide-formoterol (SYMBICORT) 160-4.5 MCG/ACT inhaler Inhale 2 puffs into the lungs 2 (two) times daily. Patient taking differently: Inhale 2 puffs into the lungs daily as needed (shortness of breath/ wheezing).  03/08/14  Yes Barbaraann Share, MD  cilostazol (PLETAL) 50 MG tablet Take 50 mg by mouth 2 (two) times daily.   Yes Historical Provider, MD  ibuprofen (ADVIL,MOTRIN) 200 MG tablet Take 200 mg by mouth daily as needed (pain).   Yes Historical Provider, MD  insulin glargine (LANTUS) 100 unit/mL SOPN Inject 20 Units into the skin at bedtime.   Yes Historical Provider, MD  losartan (COZAAR) 100 MG tablet Take 1 tablet (100 mg total) by mouth daily. 08/14/15  Yes [name], MD  Melatonin 3 MG CAPS Take 3 mg by mouth at bedtime. Reported on 09/04/2015   Yes Historical Provider, MD  memantine (NAMENDA) 10 MG tablet Take 1 tablet (10 mg total) by mouth 2 (two) times daily. 04/11/15  Yes Carmen Dohmeier, MD  metFORMIN (GLUCOPHAGE-XR) 500 MG 24 hr tablet Take 1,000 mg by mouth at bedtime. 10/26/15  Yes  Historical Provider, MD  metoprolol tartrate (LOPRESSOR) 25 MG tablet Take 1 tablet (25 mg total) by mouth 2 (two) times daily. 09/05/15  Yes [name], MD  Multiple Vitamins-Minerals (MULTIVITAMIN WITH MINERALS) tablet Take 1 tablet by mouth daily.   Yes Historical Provider, MD  nitroGLYCERIN (NITROSTAT) 0.4 MG SL tablet Place 0.4 mg under the tongue every 5 (five) minutes as needed for chest pain.   Yes Historical Provider, MD  OXYGEN Inhale into the lungs at bedtime.   Yes Historical Provider, MD  Tamsulosin HCl (FLOMAX) 0.4 MG CAPS Take 0.4 mg by mouth at bedtime.  03/23/11  Yes Historical Provider, MD  venlafaxine (EFFEXOR) 75 MG tablet Take 75-150 mg  by mouth 2 (two) times daily with a meal. Take 2 tablets (150 mg) by mouth daily with breakfast and 1 tablet (75 mg) daily with supper   Yes Historical Provider, MD  warfarin (COUMADIN) 5 MG tablet Take as directed by coumadin clinic Patient taking differently: Take 5-7.5 mg by mouth daily at 6 PM. Take 1 tablet (5 mg) by mouth on Mondays and Fridays, take 1 1/2 tablets (7.5 mg) on Sunday, Tuesday, Wednesday, Thursday, Saturday 07/30/15  Yes [name], MD  insulin lispro (HUMALOG KWIKPEN) 100 UNIT/ML KiwkPen Inject 5 Units into the skin daily with supper.    Historical Provider, MD    Physical Exam: Filed Vitals:   10/28/15 1257  BP: 125/76  Pulse: 83  Temp: 98.2 F (36.8 C)  TempSrc: Oral  Resp: 18  SpO2: 94%      Constitutional: NAD, calm, comfortable, confused Filed Vitals:   10/28/15 1257  BP: 125/76  Pulse: 83  Temp: 98.2 F (36.8 C)  TempSrc: Oral  Resp: 18  SpO2: 94%   Eyes: PERRL, lids and conjunctivae normal ENMT: Mucous membranes are moist. Posterior pharynx clear of any exudate or lesions. Wears dentures Neck: normal, supple, no masses Respiratory: clear to auscultation bilaterally, no wheezing, no crackles. Normal respiratory effort. No accessory muscle use.  Cardiovascular: Regular rate and rhythm, mechanical  click present . Trace extremity edema. 2+ pedal pulses.  Abdomen: no tenderness, no masses palpated. Bowel sounds positive.  Musculoskeletal: no clubbing / cyanosis. No joint deformity upper and lower extremities. Good ROM, no contractures. Normal muscle tone.  Skin: no rashes, lesions, ulcers. No induration Neurologic: Nonfocal, slight facial droop, appears to have slurred speech. Strength 5 out of 5 in all 4 extremities Psychiatric: Alert and oriented x 1 to person only.   Labs on Admission: I have personally reviewed following labs and imaging studies  CBC:  Recent Labs Lab 10/28/15 1311  WBC 9.9  HGB 14.2  HCT 43.6  MCV 87.2  PLT 310   Basic Metabolic Panel:  Recent Labs Lab 10/28/15 1311  NA 140  K 3.9  CL 103  CO2 26  GLUCOSE 157*  BUN 20  CREATININE 0.79  CALCIUM 8.9   GFR: CrCl cannot be calculated (Unknown ideal weight.). Liver Function Tests:  Recent Labs Lab 10/28/15 1311  AST 25  ALT 27  ALKPHOS 74  BILITOT 0.8  PROT 7.2  ALBUMIN 3.4*   Coagulation Profile:  Recent Labs Lab 10/28/15 1311  INR >10.00*   CBG:  Recent Labs Lab 10/28/15 1307  GLUCAP 157*   Urine analysis:    Component Value Date/Time   COLORURINE AMBER* 10/28/2015 1612   APPEARANCEUR CLEAR 10/28/2015 1612   LABSPEC 1.024 10/28/2015 1612   PHURINE 6.5 10/28/2015 1612   GLUCOSEU NEGATIVE 10/28/2015 1612   HGBUR NEGATIVE 10/28/2015 1612   BILIRUBINUR SMALL* 10/28/2015 1612   KETONESUR 15* 10/28/2015 1612   PROTEINUR NEGATIVE 10/28/2015 1612   UROBILINOGEN 1.0 11/27/2013 1310   NITRITE NEGATIVE 10/28/2015 1612   LEUKOCYTESUR TRACE* 10/28/2015 1612   Radiological Exams on Admission: Ct Head Wo Contrast  10/28/2015  CLINICAL DATA:  Increase in weakness over the last 6 days. EXAM: CT HEAD WITHOUT CONTRAST TECHNIQUE: Contiguous axial images were obtained from the base of the skull through the vertex without intravenous contrast. COMPARISON:  08/05/2015 FINDINGS: Chronic  area of low attenuation in the periventricular white matter of the left parietal lobe is identified compatible with previous infarct. Mild diffuse low attenuation throughout the  subcortical white matter is also noted compatible with chronic microvascular disease. No evidence for acute brain infarct, intracranial hemorrhage or mass. Prominence of the sulci and ventricles compatible with brain atrophy. The paranasal sinuses and mastoid air cells are clear. The calvarium is intact. IMPRESSION: 1. No acute intracranial abnormalities. 2. Chronic microvascular disease and brain atrophy. Electronically Signed   By: Signa Kellaylor  Stroud M.D.   On: 10/28/2015 15:41   EKG: Independently reviewed. Paced rhythm  Assessment/Plan Active Problems:   Diabetes mellitus (HCC)   Essential hypertension   COPD (chronic obstructive pulmonary disease) (HCC)   CAD (coronary artery disease)   Atrial fibrillation (HCC)   Dementia following hypoxic-ischemic injury [facility](HCC)   Vascular dementia   Acute encephalopathy   Supratherapeutic INR   Fall   Acute encephalopathy - Unclear etiology currently. Neurology consulted and have him evaluated patient. CT scan of the brain is unremarkable. Unfortunately will be unable to obtain MRI due to presence of pacemaker - He may have a urinary tract infection given reported incontinence as well as dark urine, urinalysis doesn't look terribly positive for infection however would favor getting cultures and giving ceftriaxone for now. He is not febrile and doesn't have a leukocytosis  Supratherapeutic INR - Patient's Coumadin dose was recently changed for INR 1.8. Patient's wife manages all his medication and he got everything administered correctly - Status post vitamin K in the emergency room - Continue to monitor INR closely, it drops below 2.5 will need heparin infusion  A. Fib / heart block / status post pacemaker - On Coumadin, now on hold - Monitor on telemetry  Aortic stenosis  status post mechanical valve replacement (2014) - On Coumadin, goal INR 2.5 - 3.5  Coronary artery disease status post CABG - No chest pain  Back pain - Patient with back pain when he moved on the stretcher in the ED, given fall last week we'll obtain plain lumbar x-ray   ? UTI - Soft call, however given urinary symptoms and altered mental status is not unreasonable to treat for now - discontinue antibiotics if cultures remain negative  Diabetes mellitus - Continue Lantus, add sliding scale insulin while hospitalized  Dementia - Managed as an outpatient  COPD - Appear stable, no wheezing, resume home medications    DVT prophylaxis: Warfarin  Code Status: DNR  Family Communication: d/w wife Lynette bedside Disposition Plan: admit to telemetry  Consults called: Neurology  Admission status: Observation    Pamella Pertostin Gherghe, MD [facility] Pager [phone]  If 7PM-7AM, please contact night-coverage [email or web address] Password [email or web address]  10/28/2015, 5:26 PM

## 2015-10-29 DIAGNOSIS — N390 Urinary tract infection, site not specified: Secondary | ICD-10-CM | POA: Diagnosis not present

## 2015-10-29 DIAGNOSIS — R52 Pain, unspecified: Secondary | ICD-10-CM | POA: Diagnosis present

## 2015-10-29 DIAGNOSIS — W19XXXA Unspecified fall, initial encounter: Secondary | ICD-10-CM | POA: Diagnosis present

## 2015-10-29 DIAGNOSIS — I482 Chronic atrial fibrillation: Secondary | ICD-10-CM | POA: Diagnosis not present

## 2015-10-29 DIAGNOSIS — G9341 Metabolic encephalopathy: Secondary | ICD-10-CM | POA: Diagnosis present

## 2015-10-29 DIAGNOSIS — R791 Abnormal coagulation profile: Secondary | ICD-10-CM | POA: Diagnosis present

## 2015-10-29 DIAGNOSIS — G4733 Obstructive sleep apnea (adult) (pediatric): Secondary | ICD-10-CM | POA: Diagnosis present

## 2015-10-29 DIAGNOSIS — J449 Chronic obstructive pulmonary disease, unspecified: Secondary | ICD-10-CM | POA: Diagnosis not present

## 2015-10-29 DIAGNOSIS — Z87891 Personal history of nicotine dependence: Secondary | ICD-10-CM | POA: Diagnosis not present

## 2015-10-29 DIAGNOSIS — Z8249 Family history of ischemic heart disease and other diseases of the circulatory system: Secondary | ICD-10-CM | POA: Diagnosis not present

## 2015-10-29 DIAGNOSIS — Z7901 Long term (current) use of anticoagulants: Secondary | ICD-10-CM | POA: Diagnosis not present

## 2015-10-29 DIAGNOSIS — Z9981 Dependence on supplemental oxygen: Secondary | ICD-10-CM | POA: Diagnosis not present

## 2015-10-29 DIAGNOSIS — Z794 Long term (current) use of insulin: Secondary | ICD-10-CM | POA: Diagnosis not present

## 2015-10-29 DIAGNOSIS — I25810 Atherosclerosis of coronary artery bypass graft(s) without angina pectoris: Secondary | ICD-10-CM | POA: Diagnosis not present

## 2015-10-29 DIAGNOSIS — Z79899 Other long term (current) drug therapy: Secondary | ICD-10-CM | POA: Diagnosis not present

## 2015-10-29 DIAGNOSIS — F419 Anxiety disorder, unspecified: Secondary | ICD-10-CM | POA: Diagnosis present

## 2015-10-29 DIAGNOSIS — F329 Major depressive disorder, single episode, unspecified: Secondary | ICD-10-CM | POA: Diagnosis present

## 2015-10-29 DIAGNOSIS — Z952 Presence of prosthetic heart valve: Secondary | ICD-10-CM | POA: Diagnosis not present

## 2015-10-29 DIAGNOSIS — J438 Other emphysema: Secondary | ICD-10-CM | POA: Diagnosis not present

## 2015-10-29 DIAGNOSIS — I480 Paroxysmal atrial fibrillation: Secondary | ICD-10-CM | POA: Diagnosis not present

## 2015-10-29 DIAGNOSIS — Z950 Presence of cardiac pacemaker: Secondary | ICD-10-CM | POA: Diagnosis not present

## 2015-10-29 DIAGNOSIS — E785 Hyperlipidemia, unspecified: Secondary | ICD-10-CM | POA: Diagnosis present

## 2015-10-29 DIAGNOSIS — E119 Type 2 diabetes mellitus without complications: Secondary | ICD-10-CM | POA: Diagnosis present

## 2015-10-29 DIAGNOSIS — Z881 Allergy status to other antibiotic agents status: Secondary | ICD-10-CM | POA: Diagnosis not present

## 2015-10-29 DIAGNOSIS — G9340 Encephalopathy, unspecified: Secondary | ICD-10-CM | POA: Diagnosis not present

## 2015-10-29 DIAGNOSIS — I10 Essential (primary) hypertension: Secondary | ICD-10-CM | POA: Diagnosis not present

## 2015-10-29 DIAGNOSIS — I2510 Atherosclerotic heart disease of native coronary artery without angina pectoris: Secondary | ICD-10-CM | POA: Diagnosis present

## 2015-10-29 DIAGNOSIS — I4891 Unspecified atrial fibrillation: Secondary | ICD-10-CM | POA: Diagnosis not present

## 2015-10-29 DIAGNOSIS — Z66 Do not resuscitate: Secondary | ICD-10-CM | POA: Diagnosis present

## 2015-10-29 DIAGNOSIS — Z951 Presence of aortocoronary bypass graft: Secondary | ICD-10-CM | POA: Diagnosis not present

## 2015-10-29 DIAGNOSIS — F0150 Vascular dementia without behavioral disturbance: Secondary | ICD-10-CM | POA: Diagnosis not present

## 2015-10-29 LAB — COMPREHENSIVE METABOLIC PANEL
ALT: 36 U/L (ref 17–63)
AST: 29 U/L (ref 15–41)
Albumin: 3.1 g/dL — ABNORMAL LOW (ref 3.5–5.0)
Alkaline Phosphatase: 69 U/L (ref 38–126)
Anion gap: 15 (ref 5–15)
BILIRUBIN TOTAL: 1.2 mg/dL (ref 0.3–1.2)
BUN: 15 mg/dL (ref 6–20)
CO2: 24 mmol/L (ref 22–32)
CREATININE: 0.63 mg/dL (ref 0.61–1.24)
Calcium: 8.6 mg/dL — ABNORMAL LOW (ref 8.9–10.3)
Chloride: 101 mmol/L (ref 101–111)
GFR calc Af Amer: >60 mL/min (ref >60)
GLUCOSE: 105 mg/dL — AB (ref 65–99)
Potassium: 3.3 mmol/L — ABNORMAL LOW (ref 3.5–5.1)
Sodium: 140 mmol/L (ref 135–145)
TOTAL PROTEIN: 6.3 g/dL — AB (ref 6.5–8.1)

## 2015-10-29 LAB — LIPID PANEL
Cholesterol: 135 mg/dL (ref 0–200)
HDL: 31 mg/dL — ABNORMAL LOW (ref >40)
LDL Cholesterol: 87 mg/dL (ref 0–99)
Total CHOL/HDL Ratio: 4.4 RATIO
Triglycerides: 87 mg/dL (ref <150)
VLDL: 17 mg/dL (ref 0–40)

## 2015-10-29 LAB — CBC
HCT: 43.2 % (ref 39.0–52.0)
Hemoglobin: 14 g/dL (ref 13.0–17.0)
MCH: 28.1 pg (ref 26.0–34.0)
MCHC: 32.4 g/dL (ref 30.0–36.0)
MCV: 86.7 fL (ref 78.0–100.0)
Platelets: 294 10*3/uL (ref 150–400)
RBC: 4.98 MIL/uL (ref 4.22–5.81)
RDW: 13.6 % (ref 11.5–15.5)
WBC: 7.6 10*3/uL (ref 4.0–10.5)

## 2015-10-29 LAB — RPR: RPR: NONREACTIVE

## 2015-10-29 LAB — GLUCOSE, CAPILLARY
GLUCOSE-CAPILLARY: 144 mg/dL — AB (ref 65–99)
Glucose-Capillary: 108 mg/dL — ABNORMAL HIGH (ref 65–99)
Glucose-Capillary: 128 mg/dL — ABNORMAL HIGH (ref 65–99)
Glucose-Capillary: 112 mg/dL — ABNORMAL HIGH (ref 65–99)

## 2015-10-29 LAB — PROTIME-INR
INR: 1.39 (ref 0.00–1.49)
INR: 1.24 (ref 0.00–1.49)
PROTHROMBIN TIME: 17.2 s — AB (ref 11.6–15.2)
Prothrombin Time: 15.7 seconds — ABNORMAL HIGH (ref 11.6–15.2)

## 2015-10-29 LAB — HEPARIN LEVEL (UNFRACTIONATED): Heparin Unfractionated: 0.19 IU/mL — ABNORMAL LOW (ref 0.30–0.70)

## 2015-10-29 LAB — PHOSPHORUS: Phosphorus: 3.6 mg/dL (ref 2.5–4.6)

## 2015-10-29 LAB — TSH: TSH: 0.352 u[IU]/mL (ref 0.350–4.500)

## 2015-10-29 LAB — VITAMIN B12: Vitamin B-12: 311 pg/mL (ref 180–914)

## 2015-10-29 LAB — MAGNESIUM: Magnesium: 1.9 mg/dL (ref 1.7–2.4)

## 2015-10-29 LAB — AMMONIA: Ammonia: 17 umol/L (ref 9–35)

## 2015-10-29 MED ORDER — ASPIRIN 325 MG PO TABS
325.0000 mg | ORAL_TABLET | Freq: Every day | ORAL | Status: DC
Start: 2015-10-29 — End: 2015-11-02
  Administered 2015-10-30 – 2015-11-01 (×3): 325 mg via ORAL
  Filled 2015-10-29 (×4): qty 1

## 2015-10-29 MED ORDER — WARFARIN SODIUM 5 MG PO TABS
5.0000 mg | ORAL_TABLET | Freq: Once | ORAL | Status: AC
  Administered 2015-10-29: 5 mg via ORAL
  Filled 2015-10-29: qty 1

## 2015-10-29 MED ORDER — HEPARIN BOLUS VIA INFUSION
2000.0000 [IU] | Freq: Once | INTRAVENOUS | Status: DC
  Filled 2015-10-29: qty 2000

## 2015-10-29 MED ORDER — VENLAFAXINE HCL 75 MG PO TABS
150.0000 mg | ORAL_TABLET | Freq: Every day | ORAL | Status: DC
  Administered 2015-10-30 – 2015-11-02 (×4): 150 mg via ORAL
  Filled 2015-10-29 (×5): qty 2

## 2015-10-29 MED ORDER — VENLAFAXINE HCL 75 MG PO TABS
75.0000 mg | ORAL_TABLET | Freq: Every day | ORAL | Status: DC
  Administered 2015-10-29 – 2015-11-01 (×4): 75 mg via ORAL
  Filled 2015-10-29 (×4): qty 1

## 2015-10-29 MED ORDER — HEPARIN (PORCINE) IN NACL 100-0.45 UNIT/ML-% IJ SOLN
1350.0000 [IU]/h | INTRAMUSCULAR | Status: DC
  Administered 2015-10-29: 1150 [IU]/h via INTRAVENOUS
  Administered 2015-10-31 – 2015-11-02 (×3): 1350 [IU]/h via INTRAVENOUS
  Filled 2015-10-29 (×6): qty 250

## 2015-10-29 NOTE — Evaluation (Addendum)
Physical Therapy Evaluation Patient Details Name: Adam STABENOW, [name] MRN: [id number] DOB: Jan 06, 1935 Today's Date: 10/29/2015   History of Present Illness  81 y.o. male admitted to [facility] on 10/28/15 for increased confusion.  Pt dx with acute encephalopathy with unknown origin.  His head CT was negative for a bleed, MRI cannot be done due to pacemaker, and urine cultures are pending.  His INR on admission was >10.  Pt with significant PMhx of HTN, DM2, A-flutter, CAD, aortic stenosis, DJD, DDD, spinal stenosis of the lumbar region, atrial tacycardia, s/p AVR, dementia following hypoxic-ischemic injury, complete heart block,  CABG, R leg surgery, back surgery, and pacemaker.    Clinical Impression  Pt is confused and unsteady on his feet.  Per wife, he is requiring more physical assist and is more confused, still compared to baseline.  He would be appropriate for SNF level rehab at discharge if he qualifies.  Otherwise, max HH services.    Follow Up Recommendations SNF    Equipment Recommendations  Rolling walker with 5" wheels    Recommendations for Other Services   NA    Precautions / Restrictions Precautions Precautions: Fall Precaution Comments: pt is a high fall risk due to instability and cognitive deficits      Mobility  Bed Mobility Overal bed mobility: Needs Assistance Bed Mobility: Supine to Sit;Sit to Supine     Supine to sit: Min guard;Min assist Sit to supine: Min assist   General bed mobility comments: Min guard assist to help pt find railing and pull up to sitting.  He was able to manage his own legs to get EOB.  Min assist to help lift both legs to get back supine in the bed.   Transfers Overall transfer level: Needs assistance Equipment used: Rolling walker (2 wheeled) Transfers: Sit to/from Stand Sit to Stand: Min guard;Min assist         General transfer comment: Min guard assist from higher surfaces, min assist to support trunk from lower toilet.  Verbal  cues for safety as pt tried to sit prematurely when getting back into bed.   Ambulation/Gait Ambulation/Gait assistance: Min assist Ambulation Distance (Feet): 45 Feet Assistive device: Rolling walker (2 wheeled) Gait Pattern/deviations: Step-through pattern;Shuffle;Staggering left;Staggering right Gait velocity: decreased Gait velocity interpretation: <1.8 ft/sec, indicative of risk for recurrent falls General Gait Details: Pt with staggering gait patter with slow, shuffling steps.  We attempted gait without an assistive device and pt was significantly more unstable (heavy min assist).  He needed min assist to help steer RW and for balance at his trunk.       Modified Rankin (Stroke Patients Only) Modified Rankin (Stroke Patients Only) Pre-Morbid Rankin Score: Moderate disability Modified Rankin: Moderately severe disability     Balance Overall balance assessment: Needs assistance Sitting-balance support: Feet supported;No upper extremity supported Sitting balance-Leahy Scale: Fair     Standing balance support: Bilateral upper extremity supported;No upper extremity supported;Single extremity supported Standing balance-Leahy Scale: Poor Standing balance comment: needs min assist or external support in standing.                              Pertinent Vitals/Pain Pain Assessment: No/denies pain    Home Living Family/patient expects to be discharged to:: Private residence Living Arrangements: Spouse/significant other Available Help at Discharge: Family Type of Home: House Home Access: Stairs to enter Entrance Stairs-Rails: None Entrance Stairs-Number of Steps: 1 Home Layout: One level Home  Equipment: Gilmer Morane - single point Additional Comments: pt is a poor historian.     Prior Function Level of Independence: Needs assistance   Gait / Transfers Assistance Needed: per wife, independent a week ago, and then needed a cane for gait the past few days.  ADL's /  Homemaking Assistance Needed: Per wife he was completely independent with bathing, dressing, toileting.  Now he is incontinent and having to wear depends the past few days and she is having to assist him in bathing and dressing himself.   Comments: Pt is a poor historian.  Reports he did not use an assistive device at home.         Extremity/Trunk Assessment   Upper Extremity Assessment: Defer to OT evaluation           Lower Extremity Assessment: Generalized weakness      Cervical / Trunk Assessment: Other exceptions  Communication   Communication: Other (comment) (wife reports speech is better, but not at baseline)  Cognition Arousal/Alertness: Awake/alert Behavior During Therapy: WFL for tasks assessed/performed Overall Cognitive Status: Impaired/Different from baseline Area of Impairment: Orientation;Attention;Safety/judgement;Following commands;Memory;Awareness;Problem solving Orientation Level: Disoriented to;Place;Time;Situation Current Attention Level: Sustained Memory: Decreased short-term memory Following Commands: Follows one step commands with increased time Safety/Judgement: Decreased awareness of safety;Decreased awareness of deficits Awareness: Intellectual Problem Solving: Slow processing;Difficulty sequencing;Requires verbal cues;Requires tactile cues General Comments: Pt seemed confused, 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  He seemed even more confused in the hallway than in his room.               Assessment/Plan    PT Assessment Patient needs continued PT services  PT Diagnosis Difficulty walking;Abnormality of gait;Generalized weakness;Altered mental status   PT Problem List Decreased strength;Decreased activity tolerance;Decreased mobility;Decreased balance;Decreased cognition;Decreased knowledge of use of DME;Decreased safety awareness;Decreased knowledge of precautions  PT Treatment Interventions DME  instruction;Gait training;Stair training;Functional mobility training;Therapeutic activities;Therapeutic exercise;Balance training;Neuromuscular re-education;Cognitive remediation;Patient/family education   PT Goals (Current goals can be found in the Care Plan section) Acute Rehab PT Goals Patient Stated Goal: pt unable to state PT Goal Formulation: Patient unable to participate in goal setting Time For Goal Achievement: 11/12/15 Potential to Achieve Goals: Good    Frequency Min 3X/week   Barriers to discharge Decreased caregiver support I am not sure how much physical assist his wife can provide.       End of Session Equipment Utilized During Treatment: Gait belt Activity Tolerance: Patient limited by fatigue Patient left: in bed;with call bell/phone within reach;with bed alarm set;Other (comment) (with IV team RN in room. ) Nurse Communication: Mobility status;Other (comment) (to RN tech)    Functional Assessment Tool Used: assist level Functional Limitation: Mobility: Walking and moving around Mobility: Walking and Moving Around Current Status [phone]([id number]): At least 20 percent but less than 40 percent impaired, limited or restricted Mobility: Walking and Moving Around Goal Status [phone]): At least 1 percent but less than 20 percent impaired, limited or restricted    Time: 1022-1047 PT Time Calculation (min) (ACUTE ONLY): 25 min   Charges:   PT Evaluation $PT Eval Moderate Complexity: 1 Procedure PT Treatments $Gait Training: 8-22 mins   PT G Codes:   PT G-Codes **NOT FOR INPATIENT CLASS** Functional Assessment Tool Used: assist level Functional Limitation: Mobility: Walking and moving around Mobility: Walking and Moving Around Current Status ([id number]): At least 20 percent but less than 40 percent impaired, limited or restricted Mobility:  Walking and Moving Around Goal Status [phone]): At least 1 percent but less than 20 percent impaired, limited or restricted    Stuart Mirabile B.  Herschel Fleagle, PT, DPT [id number]   10/29/2015, 11:49 AM

## 2015-10-29 NOTE — Clinical Social Work Placement (Signed)
   CLINICAL SOCIAL WORK PLACEMENT  NOTE  Date:  10/29/2015  Patient Details  Name: Adam PasseCharles D Sciuto, [name] MRN: [id number] Date of Birth: 10-07-1934  Clinical Social Work is seeking post-discharge placement for this patient at the Skilled  Nursing Facility level of care (*CSW will initial, date and re-position this form in  chart as items are completed):  Yes   Patient/family provided with [place] Clinical Social Work Department's list of facilities offering this level of care within the geographic area requested by the patient (or if unable, by the patient's family).  Yes   Patient/family informed of their freedom to choose among providers that offer the needed level of care, that participate in Medicare, Medicaid or managed care program needed by the patient, have an available bed and are willing to accept the patient.  Yes   Patient/family informed of [facility] interest [facility] and [facility], as well as of the fact that they are under no obligation to receive care at these facilities.  PASRR submitted to EDS on 10/29/15     PASRR number received on 10/29/15     Existing PASRR number confirmed on       FL2 transmitted to all facilities in geographic area requested by pt/family on 10/29/15     FL2 transmitted to all facilities within larger geographic area on       Patient informed that his/her managed care company has contracts with or will negotiate with certain facilities, including the following:            Patient/family informed of bed offers received.  Patient chooses bed at       Physician recommends and patient chooses bed at      Patient to be transferred to   on  .  Patient to be transferred to facility by       Patient family notified on   of transfer.  Name of family member notified:        PHYSICIAN Please prepare priority discharge summary, including medications, Please prepare prescriptions, Please sign FL2, Please sign DNR      Additional Comment:    _______________________________________________ Venita Lickampbell, Hosie Sharman B, LCSW 10/29/2015, 3:17 PM

## 2015-10-29 NOTE — Progress Notes (Signed)
PROGRESS NOTE  Adam Passeharles D Asleson, [name] FAO:[id number] DOB: 1934-08-01 DOA: 10/28/2015 PCP: Garlan FillersPATERSON,DANIEL G, MD Outpatient Specialists:      Brief Narrative: 81 y.o. male with medical history significant of age and fibrillation, hypertension, hyperlipidemia, status post pacemaker, coronary artery disease status post CABG in 2003, mild vascular dementia, is being brought to the emergency room by his wife for progressive confusion over the last 3-4 days  Assessment & Plan: Active Problems:   Diabetes mellitus (HCC)   Essential hypertension   COPD (chronic obstructive pulmonary disease) (HCC)   CAD (coronary artery disease)   Atrial fibrillation (HCC)   Dementia following hypoxic-ischemic injury [facility](HCC)   Vascular dementia   Acute encephalopathy   Supratherapeutic INR   Fall   Acute encephalopathy - Unclear etiology currently. Neurology consulted and have him evaluated patient. CT scan of the brain is unremarkable. Unfortunately will be unable to obtain MRI due to presence of pacemaker - He may have a urinary tract infection given reported incontinence as well as dark urine, urinalysis doesn't look terribly positive for infection however would favor getting cultures and giving ceftriaxone for now. He is not febrile and doesn't have a leukocytosis - with improvement today will favor continuing ceftriaxone pending cultures  Supratherapeutic INR - Patient's Coumadin dose was recently changed for INR 1.8. Patient's wife manages all his medication and he got everything administered correctly - Status post vitamin K in the emergency room for INR > 10 - INR down to 1.38 this morning, start heparin gtt and resume Coumadin   A. Fib / heart block / status post pacemaker - On Coumadin / heparin - Monitor on telemetry  Aortic stenosis status post mechanical valve replacement (2014) - On Coumadin, goal INR 2.5 - 3.5  Coronary artery disease status post CABG - No chest pain  Back pain -  Patient with back pain when he moved on the stretcher in the ED, given fall last week obtained plain lumbar x-ray, negative  ? UTI - Soft call, however given urinary symptoms and altered mental status is not unreasonable to treat for now - discontinue antibiotics if cultures remain negative  Diabetes mellitus - Continue Lantus, add sliding scale insulin while hospitalized  Dementia - Managed as an outpatient  COPD - Appear stable, no wheezing, resume home medications   DVT prophylaxis: heparin / Warfarin Code Status: DNR Family Communication: d/w wife bedside Disposition Plan: SNF when ready Barriers for discharge: bridge heparin / Coumadin  Consultants:   Neurology   Procedures:   None   Antimicrobials:  Ceftriaxone 5/8 >>   Subjective: - no complaints, confused. Wife at bedside states that his speech seems better this morning  Objective: Filed Vitals:   10/28/15 2355 10/29/15 0500 10/29/15 0731 10/29/15 1143  BP: 123/56 159/58 143/64 116/79  Pulse: 88 70 87 91  Temp: 99.2 F (37.3 C) 97.8 F (36.6 C) 98.4 F (36.9 C) 97.9 F (36.6 C)  TempSrc: Oral Oral Oral Oral  Resp:   18 18  Weight:  72.984 kg (160 lb 14.4 oz)    SpO2: 93% 96% 93% 94%    Intake/Output Summary (Last 24 hours) at 10/29/15 1226 Last data filed at 10/29/15 0500  Gross per 24 hour  Intake    240 ml  Output    575 ml  Net   -335 ml   Filed Weights   10/29/15 0500  Weight: 72.984 kg (160 lb 14.4 oz)    Examination: Constitutional: NAD Filed Vitals:   10/28/15  2355 10/29/15 0500 10/29/15 0731 10/29/15 1143  BP: 123/56 159/58 143/64 116/79  Pulse: 88 70 87 91  Temp: 99.2 F (37.3 C) 97.8 F (36.6 C) 98.4 F (36.9 C) 97.9 F (36.6 C)  TempSrc: Oral Oral Oral Oral  Resp:   18 18  Weight:  72.984 kg (160 lb 14.4 oz)    SpO2: 93% 96% 93% 94%   Eyes: PERRL, lids and conjunctivae normal ENMT: Mucous membranes are moist. No oropharyngeal exudates Neck: normal, supple, no masses,  no thyromegaly Respiratory: clear to auscultation bilaterally, no wheezing, no crackles. Normal respiratory effort. No accessory muscle use.  Cardiovascular: Regular rate and rhythm, 3/6 SEM, mechanical click. No extremity edema. 2+ pedal pulses. No carotid bruits.  Abdomen: no tenderness. Bowel sounds positive.  Neurologic: CN 2-12 grossly intact. Strength 5/5 in all 4.    Data Reviewed: I have personally reviewed following labs and imaging studies  CBC:  Recent Labs Lab 10/28/15 1311 10/29/15 0517  WBC 9.9 7.6  HGB 14.2 14.0  HCT 43.6 43.2  MCV 87.2 86.7  PLT 310 294   Basic Metabolic Panel:  Recent Labs Lab 10/28/15 1311 10/29/15 0517  NA 140 140  K 3.9 3.3*  CL 103 101  CO2 26 24  GLUCOSE 157* 105*  BUN 20 15  CREATININE 0.79 0.63  CALCIUM 8.9 8.6*  MG  --  1.9  PHOS  --  3.6   GFR: Estimated Creatinine Clearance: 66.2 mL/min (by C-G formula based on Cr of 0.63). Liver Function Tests:  Recent Labs Lab 10/28/15 1311 10/29/15 0517  AST 25 29  ALT 27 36  ALKPHOS 74 69  BILITOT 0.8 1.2  PROT 7.2 6.3*  ALBUMIN 3.4* 3.1*   No results for input(s): LIPASE, AMYLASE in the last 168 hours.  Recent Labs Lab 10/29/15 0857  AMMONIA 17   Coagulation Profile:  Recent Labs Lab 10/28/15 1311 10/29/15 0517  INR >10.00* 1.39   Cardiac Enzymes: No results for input(s): CKTOTAL, CKMB, CKMBINDEX, TROPONINI in the last 168 hours. BNP (last 3 results) No results for input(s): PROBNP in the last 8760 hours. HbA1C: No results for input(s): HGBA1C in the last 72 hours. CBG:  Recent Labs Lab 10/28/15 1307 10/28/15 2137 10/29/15 0728 10/29/15 1139  GLUCAP 157* 185* 108* 144*   Lipid Profile:  Recent Labs  10/29/15 0517  CHOL 135  HDL 31*  LDLCALC 87  TRIG 87  CHOLHDL 4.4   Thyroid Function Tests:  Recent Labs  10/29/15 0857  TSH 0.352   Anemia Panel:  Recent Labs  10/29/15 0857  VITAMINB12 311   Urine analysis:    Component Value  Date/Time   COLORURINE AMBER* 10/28/2015 1612   APPEARANCEUR CLEAR 10/28/2015 1612   LABSPEC 1.024 10/28/2015 1612   PHURINE 6.5 10/28/2015 1612   GLUCOSEU NEGATIVE 10/28/2015 1612   HGBUR NEGATIVE 10/28/2015 1612   BILIRUBINUR SMALL* 10/28/2015 1612   KETONESUR 15* 10/28/2015 1612   PROTEINUR NEGATIVE 10/28/2015 1612   UROBILINOGEN 1.0 11/27/2013 1310   NITRITE NEGATIVE 10/28/2015 1612   LEUKOCYTESUR TRACE* 10/28/2015 1612   Sepsis Labs: Invalid input(s): PROCALCITONIN, LACTICIDVEN  No results found for this or any previous visit (from the past 240 hour(s)).    Radiology Studies: Dg Lumbar Spine 2-3 Views  10/28/2015  CLINICAL DATA:  Low back pain after fall on Saturday. EXAM: LUMBAR SPINE - 2-3 VIEW COMPARISON:  MRI 10/18/2008 FINDINGS: Normal alignment of the lumbar spine. Multilevel degenerative endplate changes. The vertebral body heights  are maintained without a fracture. There is disc space narrowing at L1-L2. Atherosclerotic calcifications in the abdominal aorta. Again noted is mild retrolisthesis at L2-L3. IMPRESSION: No acute abnormality in lumbar spine. Electronically Signed   By: Richarda Overlie M.D.   On: 10/28/2015 18:31   Ct Head Wo Contrast  10/28/2015  CLINICAL DATA:  Increase in weakness over the last 6 days. EXAM: CT HEAD WITHOUT CONTRAST TECHNIQUE: Contiguous axial images were obtained from the base of the skull through the vertex without intravenous contrast. COMPARISON:  08/05/2015 FINDINGS: Chronic area of low attenuation in the periventricular white matter of the left parietal lobe is identified compatible with previous infarct. Mild diffuse low attenuation throughout the subcortical white matter is also noted compatible with chronic microvascular disease. No evidence for acute brain infarct, intracranial hemorrhage or mass. Prominence of the sulci and ventricles compatible with brain atrophy. The paranasal sinuses and mastoid air cells are clear. The calvarium is intact.  IMPRESSION: 1. No acute intracranial abnormalities. 2. Chronic microvascular disease and brain atrophy. Electronically Signed   By: Signa Kell M.D.   On: 10/28/2015 15:41     Scheduled Meds: . aspirin  325 mg Oral QHS  . cefTRIAXone (ROCEPHIN)  IV  1 g Intravenous Q24H  . cilostazol  50 mg Oral BID  . insulin aspart  0-9 Units Subcutaneous TID WC  . insulin glargine  20 Units Subcutaneous QHS  . losartan  100 mg Oral Daily  . Melatonin  3 mg Oral QHS  . memantine  10 mg Oral BID  . metoprolol tartrate  25 mg Oral BID  . mometasone-formoterol  2 puff Inhalation BID  . multivitamin with minerals  1 tablet Oral Daily  . senna-docusate  2 tablet Oral BID  . tamsulosin  0.4 mg Oral QHS  . venlafaxine  75-150 mg Oral BID WC  . warfarin  5 mg Oral ONCE-1800  . Warfarin - Pharmacist Dosing Inpatient   Does not apply q1800   Continuous Infusions: . heparin 1,150 Units/hr (10/29/15 0926)    Pamella Pert, MD, [name] [facility] Pager [phone]  If 7PM-7AM, please contact night-coverage [email or web address] Password [email or web address] 10/29/2015, 12:26 PM

## 2015-10-29 NOTE — Evaluation (Signed)
Occupational Therapy Evaluation Patient Details Name: Adam PasseCharles D Archey, [name] MRN: [id number] DOB: [birth year] Today's Date: 10/29/2015    History of Present Illness 81 y.o. [facility] for increased confusion.  Pt dx with acute encephalopathy with unknown origin.  His head CT was negative for a bleed, MRI cannot be done due to pacemaker, and urine cultures are pending.  His INR on admission was >10.  Pt with significant PMhx of HTN, DM2, A-flutter, CAD, aortic stenosis, DJD, DDD, spinal stenosis of the lumbar region, atrial tacycardia, s/p AVR, dementia following hypoxic-ischemic injury, complete heart block,  CABG, R leg surgery, back surgery, and pacemaker.     Clinical Impression   PT admitted with see above. Pt currently with functional limitiations due to the deficits listed below (see OT problem list). PTA at home with wife at MOD I Pt will benefit from skilled OT to increase their independence and safety with adls and balance to allow discharge SNF.     Follow Up Recommendations  SNF;Supervision/Assistance - 24 hour    Equipment Recommendations  Other (comment) (defer to SNF)    Recommendations for Other Services       Precautions / Restrictions Precautions Precautions: Fall Precaution Comments: pt is a high fall risk due to instability and cognitive deficits      Mobility Bed Mobility Overal bed mobility: Needs Assistance Bed Mobility: Sit to Supine       Sit to supine: Mod assist   General bed mobility comments: (A) for bil LE   Transfers Overall transfer level: Needs assistance Equipment used: 1 person hand held assist Transfers: Sit to/from Stand Sit to Stand: Mod assist         General transfer comment: heavy use of UE support on environmental surfaces    Balance Overall balance assessment: Needs assistance Sitting-balance support: Feet supported;No upper extremity supported Sitting balance-Leahy Scale: Fair     Standing balance  support: Bilateral upper extremity supported;During functional activity Standing balance-Leahy Scale: Poor                              ADL Overall ADL's : Needs assistance/impaired Eating/Feeding: Set up   Grooming: Wash/dry hands;Minimal assistance                   Toilet Transfer: Minimal assistance   Toileting- Clothing Manipulation and Hygiene: Moderate assistance       Functional mobility during ADLs: Minimal assistance General ADL Comments: pt reaching for all environmental supports with transfers to bathroom. pt reports bathroom is a Audiological scientist"closet"     Vision     Perception     Praxis      Pertinent Vitals/Pain Pain Assessment: No/denies pain     Hand Dominance Right   Extremity/Trunk Assessment Upper Extremity Assessment Upper Extremity Assessment: Overall WFL for tasks assessed;LUE deficits/detail LUE Deficits / Details: IV bleeding out. RN called to room to address.    Lower Extremity Assessment Lower Extremity Assessment: Defer to PT evaluation   Cervical / Trunk Assessment Cervical / Trunk Assessment: Other exceptions Cervical / Trunk Exceptions: h/o DDD, lumbar stenosis and back surgery.   Communication Communication Communication: Other (comment) (wife reports speech is better, but not at baseline)   Cognition Arousal/Alertness: Awake/alert Behavior During Therapy: WFL for tasks assessed/performed Overall Cognitive Status: Impaired/Different from baseline Area of Impairment: Orientation;Attention;Safety/judgement;Following commands;Memory;Awareness;Problem solving Orientation Level: Disoriented to;Time;Situation Current Attention Level: Sustained Memory: Decreased short-term memory  Following Commands: Follows one step commands with increased time Safety/Judgement: Decreased awareness of safety;Decreased awareness of deficits Awareness: Intellectual Problem Solving: Slow processing;Difficulty sequencing;Requires verbal  cues;Requires tactile cues General Comments: pt states "i am confused" I just figured out that [facility] am [facility] in    General Comments       Exercises       Shoulder Instructions      Home Living Family/patient expects to be discharged to:: Private residence Living Arrangements: Spouse/significant other Available Help at Discharge: Family Type of Home: House Home Access: Stairs to enter Secretary/administrator of Steps: 1 Entrance Stairs-Rails: None Home Layout: One level     Bathroom Shower/Tub: Producer, television/film/video: Standard     Home Equipment: Cane - single point   Additional Comments: pt is a poor historian.       Prior Functioning/Environment Level of Independence: Needs assistance  Gait / Transfers Assistance Needed: per wife, independent a week ago, and then needed a cane for gait the past few days. ADL's / Homemaking Assistance Needed: Per wife he was completely independent with bathing, dressing, toileting.  Now he is incontinent and having to wear depends the past few days and she is having to assist him in bathing and dressing himself.         OT Diagnosis: Generalized weakness;Acute pain;Cognitive deficits   OT Problem List: Decreased strength;Decreased activity tolerance;Impaired balance (sitting and/or standing);Decreased safety awareness;Decreased knowledge of use of DME or AE;Decreased knowledge of precautions   OT Treatment/Interventions: Self-care/ADL training;DME and/or AE instruction;Therapeutic activities;Patient/family education;Balance training;Therapeutic exercise    OT Goals(Current goals can be found in the care plan section) Acute Rehab OT Goals Patient Stated Goal: unable to report OT Goal Formulation: With patient/family Time For Goal Achievement: 11/12/15 Potential to Achieve Goals: Good  OT Frequency: Min 2X/week   Barriers to D/C:            Co-evaluation              End of  Session Equipment Utilized During Treatment: Gait belt Nurse Communication: Mobility status;Precautions  Activity Tolerance: Patient tolerated treatment well Patient left: in bed;with call bell/phone within reach   Time: 1148-1208 OT Time Calculation (min): 20 min Charges:  OT General Charges $OT Visit: 1 Procedure OT Evaluation $OT Eval Moderate Complexity: 1 Procedure G-Codes:    Harolyn Rutherford 11/07/15, 3:22 PM  Mateo Flow   OTR/L Pager: [phone] Office: [phone] .

## 2015-10-29 NOTE — Progress Notes (Signed)
ANTICOAGULATION CONSULT NOTE - Follow-up Consult  Pharmacy Consult for Warfarin and Heparin Indication: mechanical valve  Allergies  Allergen Reactions  . Doxycycline Swelling    swollen tongue     Patient Measurements: Weight: 160 lb 14.4 oz (72.984 kg) Heparin Dosing Weight: 73 kg  Vital Signs: Temp: 97.1 F (36.2 C) (05/09 1624) Temp Source: Axillary (05/09 1624) BP: 146/71 mmHg (05/09 1624) Pulse Rate: 99 (05/09 1624)  Labs:  Recent Labs  10/28/15 1311 10/29/15 0517 10/29/15 1745  HGB 14.2 14.0  --   HCT 43.6 43.2  --   PLT 310 294  --   LABPROT >90.0* 17.2* 15.7*  INR >10.00* 1.39 1.24  HEPARINUNFRC  --   --  0.19*  CREATININE 0.79 0.63  --     Estimated Creatinine Clearance: 66.2 mL/min (by C-G formula based on Cr of 0.63).   Assessment: 81yo male with history of Aflutter and aortic mechanical valve presents with AMS. Pharmacy is consulted to dose warfarin for mechanical valve. INR on presentation was > 10, reversed with vit K 5mg  in ED 5/8 ~1720. INR down to 1.39 today. Pharmacy now consulted for heparin bridge. CBC stable.   Initial HL is subtherapeutic at 0.19 on heparin 1150 units/hr. Nurse reports no issues with infusion or bleeding.  PTA Warfarin Dose: 5mg  Mon/Fri and 7.5mg  AODs  Goal of Therapy:  INR 2-3 Monitor platelets by anticoagulation protocol: Yes   Plan:  Increase heparin to 1350 units/hr F/u 8 hr heparin level Daily INR/CBC  Arlean Hoppingorey M. Newman PiesBall, PharmD, BCPS Clinical Pharmacist Pager [phone] 10/29/2015,7:38 PM

## 2015-10-29 NOTE — Progress Notes (Signed)
ANTICOAGULATION CONSULT NOTE - Follow-up Consult  Pharmacy Consult for Warfarin and Heparin Indication: mechanical valve  Allergies  Allergen Reactions  . Doxycycline Swelling    swollen tongue     Patient Measurements: Weight: 160 lb 14.4 oz (72.984 kg) Heparin Dosing Weight: 73 kg  Vital Signs: Temp: 97.8 F (36.6 C) (05/09 0500) Temp Source: Oral (05/09 0500) BP: 159/58 mmHg (05/09 0500) Pulse Rate: 70 (05/09 0500)  Labs:  Recent Labs  10/28/15 1311 10/29/15 0517  HGB 14.2 14.0  HCT 43.6 43.2  PLT 310 294  LABPROT >90.0* 17.2*  INR >10.00* 1.39  CREATININE 0.79 0.63    Estimated Creatinine Clearance: 66.2 mL/min (by C-G formula based on Cr of 0.63).   Assessment: 81yo male with history of Aflutter and aortic mechanical valve presents with AMS. Pharmacy is consulted to dose warfarin for mechanical valve. INR on presentation was > 10, reversed with vit K 5mg  in ED 5/8 ~1720. INR down to 1.39 today. Pharmacy now consulted for heparin bridge. CBC stable.   PTA Warfarin Dose: 5mg  Mon/Fri and 7.5mg  AODs  Goal of Therapy:  INR 2-3 Monitor platelets by anticoagulation protocol: Yes   Plan:  Coumadin 5mg  po tonight Start heparin at 1150 units/hr. No bolus. F/u 8 hr heparin level Daily INR/CBC  Christoper Fabianaron Ladaysha Soutar, PharmD, BCPS Clinical pharmacist, pager [phone] 10/29/2015,7:26 AM

## 2015-10-29 NOTE — Progress Notes (Signed)
Subjective: More awake today and able to converse but remains confused --baseline dementia  Exam: Filed Vitals:   10/29/15 0500 10/29/15 0731  BP: 159/58 143/64  Pulse: 70 87  Temp: 97.8 F (36.6 C) 98.4 F (36.9 C)  Resp:  18    HEENT-  Normocephalic, no lesions, without obvious abnormality.  Normal external eye and conjunctiva.  Normal TM's bilaterally.  Normal auditory canals and external ears. Normal external nose, mucus membranes and septum.  Normal pharynx. Cardiovascular- S1, S2 normal, pulses palpable throughout   Lungs- chest clear, no wheezing, rales, normal symmetric air entry Abdomen- normal findings: bowel sounds normal    Gen: In bed, NAD MS: alert but does not know place, date or year. Follows commands CN: PERRLA, EOMI, TML, face symmetrical Motor: MAEW Sensory: intact throughout DTR: 1+ and symmetric throughout UE and no LE DTR's  Pertinent Labs/Diagnostics: Ammonia 17 B12 311 Cr 0.63 UA shows no leukocytes or nitrites CT head shows no acute or chronic bleed.   Felicie MornDavid Kanetra Ho PA-C [facility] [phone]  Impression: 81 YO male with known baseline dementia presenting to hospital with worsening mentation. Today he is more awake and takes part in exam, however he remains confused. Question what his baseline cognition is. Likely worsening mentation partially due to dehydration.    Recommendations: 1) Continue conservative management. Unlikely to be stroke. Neurology S/O    10/29/2015, 10:30 AM

## 2015-10-29 NOTE — NC FL2 (Signed)
[place] MEDICAID FL2 LEVEL OF CARE SCREENING TOOL     IDENTIFICATION  Patient Name: Adam Passe, [name] Birthdate: 05/09/1935 Sex: male Admission Date (Current Location): 10/28/2015  [facility] and [place] Number:  Producer, television/film/video and Address:  [facility] [place] [address] [place], [place] [address]      Provider Number: [id number]  Attending Physician Name and Address:  Leatha Gilding, MD  Relative Name and Phone Number:       Current Level of Care: Hospital Recommended Level of Care: Skilled Nursing Facility Prior Approval Number:    Date Approved/Denied:   PASRR Number: [id number]  Discharge Plan: SNF    Current Diagnoses: Patient Active Problem List   Diagnosis Date Noted  . Altered mental status 10/28/2015  . Acute encephalopathy 10/28/2015  . Supratherapeutic INR 10/28/2015  . Fall 10/28/2015  . Mobitz type II atrioventricular block 08/21/2015  . Complete heart block (HCC) 08/21/2015  . Syncope 08/14/2015  . Vascular dementia 08/12/2015  . Memory loss 10/11/2014  . CAP (community acquired pneumonia) 08/28/2014  . OSA on CPAP 10/10/2013  . Hypersomnia with sleep apnea 10/10/2013  . Bifascicular block 08/03/2013  . Encounter for therapeutic drug monitoring 07/20/2013  . Insomnia, persistent 05/29/2013  . Unspecified sleep apnea 05/29/2013  . 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 OSA (obstructive sleep apnea)   . Heart valve replaced by other means 09/08/2012  . Long term (current) use of anticoagulants 09/08/2012  . S/P CABG x 5 08/19/2012  . Atrial tachycardia (HCC) 04/25/2012  . Fall from furniture 03/30/2011  . CAD (coronary artery disease) 09/30/2010  . Atrial flutter (HCC) 09/30/2010  . Aortic stenosis 09/30/2010  . Obese 09/30/2010  . Atrial fibrillation (HCC) 09/30/2010  . DYSPNEA 12/25/2007  . Diabetes mellitus (HCC) 11/16/2007  . HYPERLIPIDEMIA 11/16/2007  . Essential hypertension  11/16/2007  . COPD (chronic obstructive pulmonary disease) (HCC) 11/16/2007    Orientation RESPIRATION BLADDER Height & Weight     Self, Place  Normal Incontinent Weight: 72.984 kg (160 lb 14.4 oz) Height:     BEHAVIORAL SYMPTOMS/MOOD NEUROLOGICAL BOWEL NUTRITION STATUS   (NONE)  (NONE) Continent Diet (Heart Healthy)  AMBULATORY STATUS COMMUNICATION OF NEEDS Skin   Limited Assist Verbally Normal                       Personal Care Assistance Level of Assistance  Bathing, Feeding, Dressing Bathing Assistance: Limited assistance Feeding assistance: Independent Dressing Assistance: Limited assistance     Functional Limitations Info  Sight, Hearing, Speech Sight Info: Adequate Hearing Info: Adequate Speech Info: Adequate    SPECIAL CARE FACTORS FREQUENCY  PT (By licensed PT), OT (By licensed OT)     PT Frequency: 5/week OT Frequency: 5/week            Contractures Contractures Info: Not present    Additional Factors Info  Allergies, Psychotropic, Code Status, Insulin Sliding Scale Code Status Info: DNR Allergies Info: Doxycycline Psychotropic Info: Effexor, Namenda Insulin Sliding Scale Info: 3/day       Current Medications (10/29/2015):  This is the current hospital active medication list Current Facility-Administered Medications  Medication Dose Route Frequency Provider Last Rate Last Dose  . albuterol (PROVENTIL) (2.5 MG/3ML) 0.083% nebulizer solution 3 mL  3 mL Inhalation Q6H PRN Costin Otelia Sergeant, MD      . aspirin tablet 325 mg  325 mg Oral QHS Costin Otelia Sergeant, MD   325 mg  at 10/29/15 1303  . cefTRIAXone (ROCEPHIN) 1 g in dextrose 5 % 50 mL IVPB  1 g Intravenous Q24H Leatha Gildingostin M Gherghe, MD 100 mL/hr at 10/28/15 1729 1 g at 10/28/15 1729  . cilostazol (PLETAL) tablet 50 mg  50 mg Oral BID Leatha Gildingostin M Gherghe, MD   50 mg at 10/29/15 0936  . heparin ADULT infusion 100 units/mL (25000 units/250 mL)  1,150 Units/hr Intravenous Continuous Titus MouldCaron G Amend, RPH 11.5  mL/hr at 10/29/15 0926 1,150 Units/hr at 10/29/15 0926  . ibuprofen (ADVIL,MOTRIN) tablet 200 mg  200 mg Oral Daily PRN Leatha Gildingostin M Gherghe, MD   200 mg at 10/29/15 0936  . insulin aspart (novoLOG) injection 0-9 Units  0-9 Units Subcutaneous TID WC Costin Otelia SergeantM Gherghe, MD   1 Units at 10/29/15 1329  . insulin glargine (LANTUS) injection 20 Units  20 Units Subcutaneous QHS Leatha Gildingostin M Gherghe, MD   20 Units at 10/28/15 2138  . losartan (COZAAR) tablet 100 mg  100 mg Oral Daily Leatha Gildingostin M Gherghe, MD   100 mg at 10/29/15 0935  . Melatonin TABS 3 mg  3 mg Oral QHS Leatha Gildingostin M Gherghe, MD   3 mg at 10/28/15 2134  . memantine (NAMENDA) tablet 10 mg  10 mg Oral BID Leatha Gildingostin M Gherghe, MD   10 mg at 10/29/15 1330  . metoprolol tartrate (LOPRESSOR) tablet 25 mg  25 mg Oral BID Leatha Gildingostin M Gherghe, MD   25 mg at 10/29/15 0936  . mometasone-formoterol (DULERA) 200-5 MCG/ACT inhaler 2 puff  2 puff Inhalation BID Leatha Gildingostin M Gherghe, MD   2 puff at 10/28/15 2000  . multivitamin with minerals tablet 1 tablet  1 tablet Oral Daily Costin Otelia SergeantM Gherghe, MD   1 tablet at 10/29/15 0935  . senna-docusate (Senokot-S) tablet 2 tablet  2 tablet Oral BID Leatha Gildingostin M Gherghe, MD   2 tablet at 10/29/15 0936  . tamsulosin (FLOMAX) capsule 0.4 mg  0.4 mg Oral QHS Leatha Gildingostin M Gherghe, MD   0.4 mg at 10/28/15 2132  . [START ON 10/30/2015] venlafaxine (EFFEXOR) tablet 150 mg  150 mg Oral Q breakfast Costin Otelia SergeantM Gherghe, MD      . venlafaxine [facility](EFFEXOR) tablet 75 mg  75 mg Oral Q supper Scarlett Prestoheresa D Egan, [facility] Center[facility]PH      . warfarin (COUMADIN) tablet 5 mg  5 mg Oral ONCE-1800 Titus Mouldaron G Amend, RPH      . Warfarin - Pharmacist Dosing Inpatient   Does not apply q1800 Marquita Palmsorey M Ball, [facility] Hospital[facility]PH         Discharge Medications: Please see discharge summary for a list of discharge medications.  Relevant Imaging Results:  Relevant Lab Results:   Additional Information SSN: [id number]  Venita Lickampbell, Makyia Erxleben B, LCSW

## 2015-10-29 NOTE — Clinical Social Work Note (Signed)
Clinical Social Work Assessment  Patient Details  Name: Adam FILLER, [name] MRN: [id number] Date of Birth: 10-Sep-1934  Date of referral:  10/29/15               Reason for consult:  Facility Placement, Discharge Planning                Permission sought to share information with:  Facility Sport and exercise psychologist, Family Supports Permission granted to share information::  Yes, Verbal Permission Granted  Name::     Oncologist::  SNFs  Relationship::     Contact Information:     Housing/Transportation Living arrangements for the past 2 months:  [place] of Information:  Patient, Spouse Patient Interpreter Needed:  None Criminal Activity/Legal Involvement Pertinent to Current Situation/Hospitalization:  No - Comment as needed Significant Relationships:  Spouse Lives with:  Spouse Do you feel safe going back to the place where you live?  Yes Need for family participation in patient care:  Yes (Comment)  Care giving concerns:  The patient's wife expresses concern about not being able to take the patient home at time of discharge.   Social Worker assessment / plan:  CSW met with patient and wife at bedside. Both are agreeable to SNF placement for short term rehab if approved by insurance. The patient and wife prefer [facility] or [facility]. CSW explained SNF search/placement process and answered the patient's and wife's questions. SNF list provided. CSW will followup with available bed offers once received.  Employment status:  Retired Nurse, adult PT Recommendations:  [place] / Referral to community resources:  [place]  Patient/Family's Response to care:  The patient and wife appear happy with the care the patient is receiving.   Patient/Family's Understanding of and Emotional Response to Diagnosis, Current Treatment, and Prognosis:  The patient and wife appear to have good  understanding of reason for admission and the patient's post DC needs.   Emotional Assessment Appearance:  Appears stated age Attitude/Demeanor/Rapport:  Other (Patient is appropriate and welcoming of CSW. Wife does most of the talking .) Affect (typically observed):  Accepting, Appropriate, Calm, Pleasant Orientation:  Oriented to Self, Oriented to Place Alcohol / Substance use:  Not Applicable Psych involvement (Current and /or in the community):  No (Comment)  Discharge Needs  Concerns to be addressed:  Discharge Planning Concerns Readmission within the last 30 days:  No Current discharge risk:  Chronically ill, Physical Impairment, Cognitively Impaired Barriers to Discharge:  Continued Medical Work up   Fredderick Phenix B, LCSW 10/29/2015, 2:52 PM

## 2015-10-29 NOTE — Care Management Obs Status (Signed)
MEDICARE OBSERVATION STATUS NOTIFICATION   Patient Details  Name: Adam PasseCharles D Fortenberry, [name] MRN: [id number] Date of Birth: 1934/10/23   Medicare Observation Status Notification Given:  Yes (acute encephalopathy)    Gala LewandowskyGraves-Bigelow, Tanazia Achee Kaye, RN 10/29/2015, 12:44 PM

## 2015-10-30 DIAGNOSIS — I482 Chronic atrial fibrillation: Secondary | ICD-10-CM

## 2015-10-30 DIAGNOSIS — J449 Chronic obstructive pulmonary disease, unspecified: Secondary | ICD-10-CM

## 2015-10-30 LAB — PROTIME-INR
INR: 1.19 (ref 0.00–1.49)
PROTHROMBIN TIME: 15.2 s (ref 11.6–15.2)

## 2015-10-30 LAB — CBC
HCT: 44.9 % (ref 39.0–52.0)
Hemoglobin: 14.8 g/dL (ref 13.0–17.0)
MCH: 28.6 pg (ref 26.0–34.0)
MCHC: 33 g/dL (ref 30.0–36.0)
MCV: 86.8 fL (ref 78.0–100.0)
Platelets: 306 10*3/uL (ref 150–400)
RBC: 5.17 MIL/uL (ref 4.22–5.81)
RDW: 13.6 % (ref 11.5–15.5)
WBC: 8 10*3/uL (ref 4.0–10.5)

## 2015-10-30 LAB — GLUCOSE, CAPILLARY
GLUCOSE-CAPILLARY: 116 mg/dL — AB (ref 65–99)
GLUCOSE-CAPILLARY: 172 mg/dL — AB (ref 65–99)
GLUCOSE-CAPILLARY: 131 mg/dL — AB (ref 65–99)
Glucose-Capillary: 121 mg/dL — ABNORMAL HIGH (ref 65–99)

## 2015-10-30 LAB — HEMOGLOBIN A1C
HEMOGLOBIN A1C: 6.4 % — AB (ref 4.8–5.6)
Mean Plasma Glucose: 137 mg/dL

## 2015-10-30 LAB — BASIC METABOLIC PANEL
ANION GAP: 12 (ref 5–15)
BUN: 8 mg/dL (ref 6–20)
CALCIUM: 9.1 mg/dL (ref 8.9–10.3)
CO2: 27 mmol/L (ref 22–32)
CREATININE: 0.75 mg/dL (ref 0.61–1.24)
Chloride: 103 mmol/L (ref 101–111)
Glucose, Bld: 144 mg/dL — ABNORMAL HIGH (ref 65–99)
Potassium: 3.5 mmol/L (ref 3.5–5.1)
SODIUM: 142 mmol/L (ref 135–145)

## 2015-10-30 LAB — URINE CULTURE: Culture: 7000 — AB

## 2015-10-30 LAB — HEPARIN LEVEL (UNFRACTIONATED)
HEPARIN UNFRACTIONATED: 0.57 [IU]/mL (ref 0.30–0.70)
HEPARIN UNFRACTIONATED: 0.65 [IU]/mL (ref 0.30–0.70)

## 2015-10-30 MED ORDER — WARFARIN SODIUM 7.5 MG PO TABS
7.5000 mg | ORAL_TABLET | Freq: Once | ORAL | Status: AC
  Administered 2015-10-30: 7.5 mg via ORAL
  Filled 2015-10-30: qty 1

## 2015-10-30 MED ORDER — LORAZEPAM 2 MG/ML IJ SOLN: 0.5000 mg | Freq: Two times a day (BID) | INTRAMUSCULAR | Status: DC | PRN

## 2015-10-30 NOTE — Evaluation (Signed)
Speech Language Pathology Evaluation Patient Details Name: Adam MAGNAN, [name] MRN: [id number] DOB: 1935/04/01 Today's Date: 10/30/2015 Time: 1308-6578 SLP Time Calculation (min) (ACUTE ONLY): 26 min  Problem List:  Patient Active Problem List   Diagnosis Date Noted  . Altered mental status 10/28/2015  . Acute encephalopathy 10/28/2015  . Supratherapeutic INR 10/28/2015  . Fall 10/28/2015  . Mobitz type II atrioventricular block 08/21/2015  . Complete heart block (HCC) 08/21/2015  . Syncope 08/14/2015  . Vascular dementia 08/12/2015  . Memory loss 10/11/2014  . CAP (community acquired pneumonia) 08/28/2014  . OSA on CPAP 10/10/2013  . Hypersomnia with sleep apnea 10/10/2013  . Bifascicular block 08/03/2013  . Encounter for therapeutic drug monitoring 07/20/2013  . Insomnia, persistent 05/29/2013  . Unspecified sleep apnea 05/29/2013  . 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 OSA (obstructive sleep apnea)   . Heart valve replaced by other means 09/08/2012  . Long term (current) use of anticoagulants 09/08/2012  . S/P CABG x 5 08/19/2012  . Atrial tachycardia (HCC) 04/25/2012  . Fall from furniture 03/30/2011  . CAD (coronary artery disease) 09/30/2010  . Atrial flutter (HCC) 09/30/2010  . Aortic stenosis 09/30/2010  . Obese 09/30/2010  . Atrial fibrillation (HCC) 09/30/2010  . DYSPNEA 12/25/2007  . Diabetes mellitus (HCC) 11/16/2007  . HYPERLIPIDEMIA 11/16/2007  . Essential hypertension 11/16/2007  . COPD (chronic obstructive pulmonary disease) (HCC) 11/16/2007   Past Medical History:  Past Medical History  Diagnosis Date  . Hypertension   . Hyperlipidemia   . Type II or unspecified type diabetes mellitus without mention of complication, not stated as uncontrolled   . Nephrolithiasis   . Atrial flutter [facility])     s/p CTI ablation by Dr Ladona Ridgel 1/12  . CAD (coronary artery disease)     s/p CABG x 5 2003 by Dr Laneta Simmers  . Aortic stenosis,  moderate     moderate to severe per echo 03/2012 with AVR in March of 2014  . DJD (degenerative joint disease)   . DDD (degenerative disc disease)   . Spinal stenosis of lumbar region   . Bifascicular block   . Atrial tachycardia (HCC)   . Anxiety   . Depression   . S/P AVR (aortic valve replacement)   . OSA (obstructive sleep apnea)   . 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 Diverticulosis   . Hemorrhoids   . Complete heart block (HCC) 08/21/15    STJ PPM, Dr. Johney Frame   Past Surgical History:  Past Surgical History  Procedure Laterality Date  . Cabg  2003    by Dr Laneta Simmers  . Anal fissure repair    . Leg surgery      right  . Trigger finger release      bilat.  . Tonsillectomy    . Back surgery  2010  . Atrial ablation surgery  1/12    atrial flutter ablation by Dr Ladona Ridgel  . Cardioversion  04/07/2012    Procedure: CARDIOVERSION;  Surgeon: Wendall Stade, MD;  [facility] ENDOSCOPY;  Service: Cardiovascular;  Laterality: N/A;  . Coronary artery bypass graft      10 yrs ago   03  . Aortic valve replacement N/A 09/01/2012    Procedure: AORTIC VALVE REPLACEMENT (AVR);  Surgeon: Alleen Borne, MD;  Location: [facility] OR;  Service: Open Heart Surgery;  Laterality: N/A;  . Intraoperative transesophageal echocardiogram N/A 09/01/2012    Procedure: INTRAOPERATIVE TRANSESOPHAGEAL ECHOCARDIOGRAM;  Surgeon: Alleen Borne,  MD;  Location: [facility] OR;  Service: Open Heart Surgery;  Laterality: N/A;  . Ep implantable device N/A 08/21/2015    STJ PPM, Dr. Johney FrameAllred   HPI:  81 y.o. [facility] for increased confusion. Pt dx with acute encephalopathy with unknown origin. His head CT was negative for a bleed, MRI cannot be done due to pacemaker, and urine cultures are pending. His INR on admission was >10. Pt with significant PMhx of HTN, DM2, A-flutter, CAD, aortic stenosis, DJD, DDD, spinal stenosis of the lumbar region, atrial tacycardia, s/p AVR, dementia  following hypoxic-ischemic injury, complete heart block, CABG, R leg surgery, back surgery, and pacemaker.    Assessment / Plan / Recommendation Clinical Impression  Pt presents with altered mentation from baseline, including disorientation, reduced sustained attention, and impaired storage/recall of new information. Working memory is limited as well, making it difficult to complete basic functional tasks. Mod cues from SLP provided for simple menu reading task. He appears to follow one-step commands well, but has difficulty processing and retaining information to complete multi-step instructions. Pt's wife reports improvements in mentation each day, which should be expected if this is suspected to be secondary to UTI. SLP to f/u briefly while in acute level of care to assess for improvements, although discussed with wife that it may be more appropriate to pursue SLP f/u at SNF if difficulties persist.     SLP Assessment  Patient needs continued Speech Lanaguage Pathology Services    Follow Up Recommendations  24 hour supervision/assistance;Skilled Nursing facility    Frequency and Duration    One additional visit      SLP Evaluation Prior Functioning  Cognitive/Linguistic Baseline: Baseline deficits Baseline deficit details: h/o dementia Type of Home: House  Lives With: Spouse   Cognition  Overall Cognitive Status: Impaired/Different from baseline Arousal/Alertness: Awake/alert Orientation Level: Oriented to person;Disoriented to place;Disoriented to time;Disoriented to situation Attention: Sustained Sustained Attention: Impaired Sustained Attention Impairment: Verbal basic;Functional basic Memory: Impaired Memory Impairment: Decreased recall of new information Awareness: Impaired Awareness Impairment: Intellectual impairment;Emergent impairment;Anticipatory impairment Problem Solving: Impaired Problem Solving Impairment: Verbal basic;Functional basic Safety/Judgment: Impaired     Comprehension  Auditory Comprehension Overall Auditory Comprehension: Impaired Yes/No Questions: Impaired Basic Biographical Questions: 76-100% accurate Complex Questions: 50-74% accurate Commands: Impaired One Step Basic Commands: 75-100% accurate Complex Commands: 25-49% accurate    Expression Expression Primary Mode of Expression: Verbal Verbal Expression Overall Verbal Expression: Impaired (disorganized, limited content)   Oral / Motor  Motor Speech Overall Motor Speech: Appears within functional limits for tasks assessed   GO                   Adam [name], Adam [name] [phone]  Adam Hamaiewonsky, Adam [name] 10/30/2015, 4:46 PM

## 2015-10-30 NOTE — Progress Notes (Signed)
Pt with increased agitation and confused about why he is hospital room. Multiple attempts OOB without calling for assistance. Pt very impulsive.  Bed alarm and chair alarm ineffective to keep pt from getting up oob. Reorientation offered, but pt does not comprehend. Dr. Elisabeth Pigeonevine notified. Order received for Recruitment consultantsafety sitter.

## 2015-10-30 NOTE — Progress Notes (Signed)
ANTICOAGULATION CONSULT NOTE - Follow Up Consult  Pharmacy Consult for Heparin and Coumadin Indication: aflutter and aortic valve  Allergies  Allergen Reactions  . Doxycycline Swelling    swollen tongue     Patient Measurements: Weight: 159 lb 1.6 oz (72.167 kg)  Vital Signs: Temp: 97.4 F (36.3 C) (05/10 1342) Temp Source: Oral (05/10 1342) BP: 118/45 mmHg (05/10 1342) Pulse Rate: 97 (05/10 1342)  Labs:  Recent Labs  10/28/15 1311 10/29/15 0517 10/29/15 1745 10/30/15 0533 10/30/15 0534 10/30/15 0813 10/30/15 1249  HGB 14.2 14.0  --  14.8  --   --   --   HCT 43.6 43.2  --  44.9  --   --   --   PLT 310 294  --  306  --   --   --   LABPROT >90.0* 17.2* 15.7* 15.2  --   --   --   INR >10.00* 1.39 1.24 1.19  --   --   --   HEPARINUNFRC  --   --  0.19*  --  0.57  --  0.65  CREATININE 0.79 0.63  --   --   --  0.75  --     Estimated Creatinine Clearance: 66.2 mL/min (by C-G formula based on Cr of 0.75).   Medications:  Heparin @ 1350 units/hr  Assessment: 80yom on coumadin pta for aflutter and bioprosthetic aortic valve. INR on admission > 10 and he was given vitamin k 5mg  IV on 5/8. INR down to 1.39 yesterday so heparin bridge added and coumadin resumed. Heparin level is therapeutic at 0.65. INR continues to fall to 1.19. CBC stable. No bleeding.  Home dose: 7.5mg  daily except 5mg  on Mon/Fri - last taken 5/8 (dose recently increased 4/26 at coumadin clinic for INR 1.8)  Goal of Therapy:  INR 2-3 Heparin level 0.3-0.7 units/ml Monitor platelets by anticoagulation protocol: Yes   Plan:  1) Continue heparin at 1350 units/hr 2) Coumadin 7.5mg  x 1 3) Daily heparin level, INR, CBC  Fredrik RiggerMarkle, Adam [name] 10/30/2015,2:19 PM

## 2015-10-30 NOTE — Progress Notes (Signed)
ANTICOAGULATION CONSULT NOTE - Follow Up Consult  Pharmacy Consult for heparin Indication: Aflutter and AVR   Labs:  Recent Labs  10/28/15 1311 10/29/15 0517 10/29/15 1745 10/30/15 0533 10/30/15 0534  HGB 14.2 14.0  --  14.8  --   HCT 43.6 43.2  --  44.9  --   PLT 310 294  --  306  --   LABPROT >90.0* 17.2* 15.7*  --   --   INR >10.00* 1.39 1.24  --   --   HEPARINUNFRC  --   --  0.19*  --  0.57  CREATININE 0.79 0.63  --   --   --     Assessment/Plan:  81yo male therapeutic on heparin after rate change. Will continue gtt at current rate and confirm stable with additional level.   Vernard GamblesVeronda Yeilyn Gent, PharmD, BCPS  10/30/2015,6:15 AM

## 2015-10-30 NOTE — Discharge Instructions (Signed)

## 2015-10-30 NOTE — Progress Notes (Addendum)
Patient ID: Adam Passeharles D Monroy, Adam [name], male   DOB: 11-04-1934, 81 y.o.   MRN: [id number]  PROGRESS NOTE    Adam Passeharles D Floor, Adam [name]  WUJ:[id number] DOB: 11-04-1934 DOA: 10/28/2015  PCP: Garlan FillersPATERSON,DANIEL G, MD   Brief Narrative:  81 y.o. male with medical history significant for atrial fibrillation, dyslipidemia, pacemaker, coronary artery disease status post CABG in 2003, vascular dementia who presented to [facility] with progressive confusion for past few days prior to this admission. Patient was seen by neurology during this hospital stay. CT head was unremarkable. Unable to obtain MRI because patient has pacemaker.  Assessment & Plan:  Acute metabolic encephalopathy / urinary tract infection - Likely secondary to urinary tract infection - He is treated with Rocephin - Urine culture with insignificant growth - He has been seen by neurology in consultation. He is a CT scan is unremarkable and MRI was not done because patient has pacemaker - Mental status better this morning, oriented to time, place and person  Chronic atrial fibrillation / heart block / status post pacemaker - CHADS vasc score 4  - On anticoagulation with heparin drip and Coumadin - Transition to Coumadin only once INR 2-3 - Off note, INR on the admission more than 10 so patient was given vitamin K in emergency room  Essential hypertension - Continue losartan and metoprolol  Aortic stenosis status post mechanical valve replacement (2014) - On Coumadin, goal INR 2-3  Coronary artery disease status post CABG - Stable, chest pain-free - Patient is also on aspirin in addition to his anticoagulants  Back pain -Lumbar spine x-ray with no acute fractures   Diabetes mellitusWith peripheral circulatory complications with long term insulin use - Continue current insulin regimen  - CBGs 116, 112 - A1c on this admission, 6.4 indicating good glycemic control  Vascular dementia - Continue memantine  COPD - Stable  respiratory status   DVT prophylaxis: Full dose anticoagulation with Coumadin Code Status: DNR/DNI Family Communication: Family not at the bedside this morning Disposition Plan: Home once INR at therapeutic level, INR still subtherapeutic this morning   Consultants:   Neurology  Procedures:   None   Antimicrobials:   Rocephin 10/28/2015 -->   Subjective: No overnight events  Objective: Filed Vitals:   10/30/15 0047 10/30/15 0400 10/30/15 0756 10/30/15 0817  BP: 146/49 95/78 162/62 154/72  Pulse:  98 95   Temp: 98.1 F (36.7 C) 97.8 F (36.6 C) 98.9 F (37.2 C)   TempSrc: Oral Oral Oral   Resp: 18 18 16    Weight:  72.167 kg (159 lb 1.6 oz)    SpO2: 94% 93% 93%     Intake/Output Summary (Last 24 hours) at 10/30/15 1106 Last data filed at 10/30/15 0900  Gross per 24 hour  Intake 1128.52 ml  Output    900 ml  Net 228.52 ml   Filed Weights   10/29/15 0500 10/30/15 0400  Weight: 72.984 kg (160 lb 14.4 oz) 72.167 kg (159 lb 1.6 oz)    Examination:  General exam: Appears calm and comfortable  Respiratory system: Clear to auscultation. Respiratory effort normal. Cardiovascular system: S1 & S2 heard, Rate controlled. No JVD. Gastrointestinal system: Abdomen is nondistended, soft and nontender. No organomegaly or masses felt. Normal bowel sounds heard. Central nervous system: Alert and oriented. No focal neurological deficits. Extremities: Symmetric 5 x 5 power. Skin: No rashes, lesions or ulcers Psychiatry: Judgement and insight appear normal. Mood & affect appropriate.   Data Reviewed: I have personally reviewed  following labs and imaging studies  CBC:  Recent Labs Lab 10/28/15 1311 10/29/15 0517 10/30/15 0533  WBC 9.9 7.6 8.0  HGB 14.2 14.0 14.8  HCT 43.6 43.2 44.9  MCV 87.2 86.7 86.8  PLT 310 294 306   Basic Metabolic Panel:  Recent Labs Lab 10/28/15 1311 10/29/15 0517 10/30/15 0813  NA 140 140 142  K 3.9 3.3* 3.5  CL 103 101 103  CO2  26 24 27   GLUCOSE 157* 105* 144*  BUN 20 15 8   CREATININE 0.79 0.63 0.75  CALCIUM 8.9 8.6* 9.1  MG  --  1.9  --   PHOS  --  3.6  --    GFR: Estimated Creatinine Clearance: 66.2 mL/min (by C-G formula based on Cr of 0.75). Liver Function Tests:  Recent Labs Lab 10/28/15 1311 10/29/15 0517  AST 25 29  ALT 27 36  ALKPHOS 74 69  BILITOT 0.8 1.2  PROT 7.2 6.3*  ALBUMIN 3.4* 3.1*   No results for input(s): LIPASE, AMYLASE in the last 168 hours.  Recent Labs Lab 10/29/15 0857  AMMONIA 17   Coagulation Profile:  Recent Labs Lab 10/28/15 1311 10/29/15 0517 10/29/15 1745 10/30/15 0533  INR >10.00* 1.39 1.24 1.19   Cardiac Enzymes: No results for input(s): CKTOTAL, CKMB, CKMBINDEX, TROPONINI in the last 168 hours. BNP (last 3 results) No results for input(s): PROBNP in the last 8760 hours. HbA1C:  Recent Labs  10/29/15 0517  HGBA1C 6.4*   CBG:  Recent Labs Lab 10/29/15 0728 10/29/15 1139 10/29/15 1610 10/29/15 2205 10/30/15 0752  GLUCAP 108* 144* 128* 112* 116*   Lipid Profile:  Recent Labs  10/29/15 0517  CHOL 135  HDL 31*  LDLCALC 87  TRIG 87  CHOLHDL 4.4   Thyroid Function Tests:  Recent Labs  10/29/15 0857  TSH 0.352   Anemia Panel:  Recent Labs  10/29/15 0857  VITAMINB12 311   Urine analysis:    Component Value Date/Time   COLORURINE AMBER* 10/28/2015 1612   APPEARANCEUR CLEAR 10/28/2015 1612   LABSPEC 1.024 10/28/2015 1612   PHURINE 6.5 10/28/2015 1612   GLUCOSEU NEGATIVE 10/28/2015 1612   HGBUR NEGATIVE 10/28/2015 1612   BILIRUBINUR SMALL* 10/28/2015 1612   KETONESUR 15* 10/28/2015 1612   PROTEINUR NEGATIVE 10/28/2015 1612   UROBILINOGEN 1.0 11/27/2013 1310   NITRITE NEGATIVE 10/28/2015 1612   LEUKOCYTESUR TRACE* 10/28/2015 1612   Sepsis Labs: @LABRCNTIP (procalcitonin:4,lacticidven:4)    Recent Results (from the past 240 hour(s))  Culture, Urine     Status: Abnormal   Collection Time: 10/28/15  4:12 PM  Result  Value Ref Range Status   Specimen Description URINE, RANDOM  Final   Special Requests NONE  Final   Culture 7,000 COLONIES/mL INSIGNIFICANT GROWTH (A)  Final   Report Status 10/30/2015 FINAL  Final      Radiology Studies: Dg Lumbar Spine 2-3 Views 10/28/2015   No acute abnormality in lumbar spine.   Ct Head Wo Contrast 10/28/2015 1. No acute intracranial abnormalities. 2. Chronic microvascular disease and brain atrophy.    Scheduled Meds: . aspirin  325 mg Oral QHS  . cefTRIAXone (ROCEPHIN)  IV  1 g Intravenous Q24H  . cilostazol  50 mg Oral BID  . insulin aspart  0-9 Units Subcutaneous TID WC  . insulin glargine  20 Units Subcutaneous QHS  . losartan  100 mg Oral Daily  . Melatonin  3 mg Oral QHS  . memantine  10 mg Oral BID  . metoprolol tartrate  25 mg Oral BID  . mometasone-formoterol  2 puff Inhalation BID  . multivitamin with minerals  1 tablet Oral Daily  . senna-docusate  2 tablet Oral BID  . tamsulosin  0.4 mg Oral QHS  . venlafaxine  150 mg Oral Q breakfast  . venlafaxine  75 mg Oral Q supper  . Warfarin - Pharmacist Dosing Inpatient   Does not apply q1800   Continuous Infusions: . heparin 1,350 Units/hr (10/29/15 2004)     LOS: 1 day    Time spent: 25 minutes  Greater than 50% of the time spent on counseling and coordinating the care.   Manson Passey, MD [facility] Pager [phone]  If 7PM-7AM, please contact night-coverage [email or web address] Password [email or web address] 10/30/2015, 11:06 AM

## 2015-10-31 ENCOUNTER — Inpatient Hospital Stay (HOSPITAL_COMMUNITY): Payer: Medicare Other

## 2015-10-31 DIAGNOSIS — I10 Essential (primary) hypertension: Secondary | ICD-10-CM

## 2015-10-31 DIAGNOSIS — I4891 Unspecified atrial fibrillation: Secondary | ICD-10-CM

## 2015-10-31 LAB — CBC
HCT: 41.9 % (ref 39.0–52.0)
HEMOGLOBIN: 13.8 g/dL (ref 13.0–17.0)
MCH: 28.5 pg (ref 26.0–34.0)
MCHC: 32.9 g/dL (ref 30.0–36.0)
MCV: 86.4 fL (ref 78.0–100.0)
PLATELETS: 313 10*3/uL (ref 150–400)
RBC: 4.85 MIL/uL (ref 4.22–5.81)
RDW: 13.7 % (ref 11.5–15.5)
WBC: 7.1 10*3/uL (ref 4.0–10.5)

## 2015-10-31 LAB — PROTIME-INR
INR: 1.14 (ref 0.00–1.49)
PROTHROMBIN TIME: 14.8 s (ref 11.6–15.2)

## 2015-10-31 LAB — GLUCOSE, CAPILLARY
GLUCOSE-CAPILLARY: 118 mg/dL — AB (ref 65–99)
GLUCOSE-CAPILLARY: 155 mg/dL — AB (ref 65–99)
Glucose-Capillary: 122 mg/dL — ABNORMAL HIGH (ref 65–99)
Glucose-Capillary: 144 mg/dL — ABNORMAL HIGH (ref 65–99)

## 2015-10-31 LAB — HEPARIN LEVEL (UNFRACTIONATED): Heparin Unfractionated: 0.61 IU/mL (ref 0.30–0.70)

## 2015-10-31 MED ORDER — WARFARIN SODIUM 10 MG PO TABS
10.0000 mg | ORAL_TABLET | Freq: Once | ORAL | Status: AC
  Administered 2015-10-31: 10 mg via ORAL
  Filled 2015-10-31: qty 1

## 2015-10-31 NOTE — Progress Notes (Addendum)
Patient ID: Adam Passe, [name], male   DOB: 1935/03/30, 81 y.o.   MRN: [id number]  PROGRESS NOTE    Adam Passe, [name]  WUJ:[id number] DOB: 1934-12-31 DOA: 10/28/2015  PCP: Garlan Fillers, MD   Brief Narrative:  81 y.o. male with medical history significant for atrial fibrillation, dyslipidemia, pacemaker, coronary artery disease status post CABG in 2003, vascular dementia who presented to [facility] with progressive confusion for past few days prior to this admission. Patient was seen by neurology during this hospital stay. CT head was unremarkable. Unable to obtain MRI because patient has pacemaker.  Assessment & Plan:  Acute metabolic encephalopathy / urinary tract infection - Ssecondary to urinary tract infection - Stop rocephin today  - Urine culture with insignificant growth - No acute intracranial findings on CT scan. Per family request, we will repeat CT scan today to monitor for any potential bleed. Patient is unable to do MRI because he has pacemaker.  Chronic atrial fibrillation / heart block / status post pacemaker - CHADS vasc score 4  - On anticoagulation with heparin drip and Coumadin - Off note, INR on the admission more than 10 so given vitamin K in emergency room  Essential hypertension - Continue losartan 100 mg daily and metoprolol 25 mg twice daily  Aortic stenosis status post mechanical valve replacement (2014) - On Coumadin - For bridging he is on heparin drip - INR 1.14 this morning  Coronary artery disease status post CABG - Stable, chest pain-free - Patient is also on aspirin in addition to heparin drip   Back pain -Lumbar spine x-ray with no acute fractures  - Continue supportive care, analgesia as needed  Diabetes mellitusWith peripheral circulatory complications with long term insulin use - Continue current insulin regimen which includes Lantus 20 units at bedtime along with sliding scale insulin - A1c on this admission, 6.4  indicating good glycemic control  Vascular dementia - Continue memantine - Per patient's daughter at the bedside, no significant changes in mental status since the admission  COPD - Stable respiratory status   DVT prophylaxis: Full dose anticoagulation with Coumadin Code Status: DNR/DNI Family Communication: Pt daughter at the bedside this morning Disposition Plan: Home once INR at therapeutic level, INR still subtherapeutic this morning   Consultants:   Neurology  Procedures:   None   Antimicrobials:   Rocephin 10/28/2015 --> 10/31/2015   Subjective: No overnight events. Per pt daughter at the bedside, "he is still not himself".  Objective: Filed Vitals:   10/31/15 0500 10/31/15 0821 10/31/15 0909 10/31/15 1230  BP: 109/55 122/61  126/88  Pulse:  83  78  Temp: 98.3 F (36.8 C) 98.2 F (36.8 C)  97.5 F (36.4 C)  TempSrc: Oral Oral  Axillary  Resp: 16 17    Weight: 72.485 kg (159 lb 12.8 oz)     SpO2: 93% 94% 94% 98%    Intake/Output Summary (Last 24 hours) at 10/31/15 1235 Last data filed at 10/31/15 0500  Gross per 24 hour  Intake 1478.37 ml  Output      0 ml  Net 1478.37 ml   Filed Weights   10/29/15 0500 10/30/15 0400 10/31/15 0500  Weight: 72.984 kg (160 lb 14.4 oz) 72.167 kg (159 lb 1.6 oz) 72.485 kg (159 lb 12.8 oz)    Examination:  General exam: Appears calm, no acute distress  Respiratory system: Respiratory effort normal. No wheezing  Cardiovascular system: S1 & S2 (+), RRR.  Gastrointestinal system: (+) BS,  non tender  Central nervous system: No focal neurological deficits. Extremities: no clubbing, no edema, no tenderness  Skin: warm, dry Psychiatry: normal behavior, not restless    Data Reviewed: I have personally reviewed following labs and imaging studies  CBC:  Recent Labs Lab 10/28/15 1311 10/29/15 0517 10/30/15 0533 10/31/15 0505  WBC 9.9 7.6 8.0 7.1  HGB 14.2 14.0 14.8 13.8  HCT 43.6 43.2 44.9 41.9  MCV 87.2 86.7  86.8 86.4  PLT 310 294 306 313   Basic Metabolic Panel:  Recent Labs Lab 10/28/15 1311 10/29/15 0517 10/30/15 0813  NA 140 140 142  K 3.9 3.3* 3.5  CL 103 101 103  CO2 GLUCOSE 157* 105* 144*  BUN CREATININE 0.79 0.63 0.75  CALCIUM 8.9 8.6* 9.1  MG  --  1.9  --   PHOS  --  3.6  --    GFR: Estimated Creatinine Clearance: 66.2 mL/min (by C-G formula based on Cr of 0.75). Liver Function Tests:  Recent Labs Lab 10/28/15 1311 10/29/15 0517  AST 25 29  ALT 27 36  ALKPHOS 74 69  BILITOT 0.8 1.2  PROT 7.2 6.3*  ALBUMIN 3.4* 3.1*   No results for input(s): LIPASE, AMYLASE in the last 168 hours.  Recent Labs Lab 10/29/15 0857  AMMONIA 17   Coagulation Profile:  Recent Labs Lab 10/28/15 1311 10/29/15 0517 10/29/15 1745 10/30/15 0533 10/31/15 0505  INR >10.00* 1.39 1.24 1.19 1.14   Cardiac Enzymes: No results for input(s): CKTOTAL, CKMB, CKMBINDEX, TROPONINI in the last 168 hours. BNP (last 3 results) No results for input(s): PROBNP in the last 8760 hours. HbA1C:  Recent Labs  10/29/15 0517  HGBA1C 6.4*   CBG:  Recent Labs Lab 10/30/15 1123 10/30/15 1635 10/30/15 2110 10/31/15 0722 10/31/15 1141  GLUCAP 172* 131* 121* 118* 155*   Lipid Profile:  Recent Labs  10/29/15 0517  CHOL 135  HDL 31*  LDLCALC 87  TRIG 87  CHOLHDL 4.4   Thyroid Function Tests:  Recent Labs  10/29/15 0857  TSH 0.352   Anemia Panel:  Recent Labs  10/29/15 0857  VITAMINB12 311   Urine analysis:    Component Value Date/Time   COLORURINE AMBER* 10/28/2015 1612   APPEARANCEUR CLEAR 10/28/2015 1612   LABSPEC 1.024 10/28/2015 1612   PHURINE 6.5 10/28/2015 1612   GLUCOSEU NEGATIVE 10/28/2015 1612   HGBUR NEGATIVE 10/28/2015 1612   BILIRUBINUR SMALL* 10/28/2015 1612   KETONESUR 15* 10/28/2015 1612   PROTEINUR NEGATIVE 10/28/2015 1612   UROBILINOGEN 1.0 11/27/2013 1310   NITRITE NEGATIVE 10/28/2015 1612   LEUKOCYTESUR TRACE* 10/28/2015  1612   Sepsis Labs: (procalcitonin:4,lacticidven:4)    Recent Results (from the past 240 hour(s))  Culture, Urine     Status: Abnormal   Collection Time: 10/28/15  4:12 PM  Result Value Ref Range Status   Specimen Description URINE, RANDOM  Final   Special Requests NONE  Final   Culture 7,000 COLONIES/mL INSIGNIFICANT GROWTH (A)  Final   Report Status 10/30/2015 FINAL  Final      Radiology Studies: Dg Lumbar Spine 2-3 Views 10/28/2015   No acute abnormality in lumbar spine.   Ct Head Wo Contrast 10/28/2015 1. No acute intracranial abnormalities. 2. Chronic microvascular disease and brain atrophy.    Scheduled Meds: . aspirin  325 mg Oral QHS  . cefTRIAXone (ROCEPHIN)  IV  1 g Intravenous Q24H  . cilostazol  50 mg Oral BID  .  insulin aspart  0-9 Units Subcutaneous TID WC  . insulin glargine  20 Units Subcutaneous QHS  . losartan  100 mg Oral Daily  . Melatonin  3 mg Oral QHS  . memantine  10 mg Oral BID  . metoprolol tartrate  25 mg Oral BID  . mometasone-formoterol  2 puff Inhalation BID  . multivitamin with minerals  1 tablet Oral Daily  . senna-docusate  2 tablet Oral BID  . tamsulosin  0.4 mg Oral QHS  . venlafaxine  150 mg Oral Q breakfast  . venlafaxine  75 mg Oral Q supper  . warfarin  10 mg Oral ONCE-1800  . Warfarin - Pharmacist Dosing Inpatient   Does not apply q1800   Continuous Infusions: . heparin 1,350 Units/hr (10/29/15 2004)     LOS: 2 days    Time spent: 25 minutes  Greater than 50% of the time spent on counseling and coordinating the care.   Manson PasseyEVINE, ALMA, MD [facility] Pager [phone]  If 7PM-7AM, please contact night-coverage [email or web address] Password [email or web address] 10/31/2015, 12:35 PM

## 2015-10-31 NOTE — Progress Notes (Signed)
ANTICOAGULATION CONSULT NOTE - Follow Up Consult  Pharmacy Consult for Heparin and Coumadin Indication: aflutter and aortic valve  Allergies  Allergen Reactions  . Doxycycline Swelling    swollen tongue     Patient Measurements: Weight: 159 lb 12.8 oz (72.485 kg)  Vital Signs: Temp: 98.2 F (36.8 C) (05/11 0821) Temp Source: Oral (05/11 0821) BP: 122/61 mmHg (05/11 0821) Pulse Rate: 83 (05/11 0821)  Labs:  Recent Labs  10/28/15 1311 10/29/15 0517  10/29/15 1745 10/30/15 0533 10/30/15 0534 10/30/15 0813 10/30/15 1249 10/31/15 0505  HGB 14.2 14.0  --   --  14.8  --   --   --  13.8  HCT 43.6 43.2  --   --  44.9  --   --   --  41.9  PLT 310 294  --   --  306  --   --   --  313  LABPROT >90.0* 17.2*  --  15.7* 15.2  --   --   --  14.8  INR >10.00* 1.39  --  1.24 1.19  --   --   --  1.14  HEPARINUNFRC  --   --   < > 0.19*  --  0.57  --  0.65 0.61  CREATININE 0.79 0.63  --   --   --   --  0.75  --   --   < > = values in this interval not displayed.  Estimated Creatinine Clearance: 66.2 mL/min (by C-G formula based on Cr of 0.75).   Medications:  Heparin @ 1350 units/hr  Assessment: 80yom on coumadin pta for aflutter and bioprosthetic aortic valve. INR on admission > 10 and he was given vitamin k 5mg  IV on 5/8. INR down to 1.39 on 5/9 so heparin bridge added and coumadin resumed. Heparin level is therapeutic at 0.61. INR continues to fall to 1.14. Will likely need higher doses to overcome vitamin k resistance. CBC stable. No bleeding.  Home dose: 7.5mg  daily except 5mg  on Mon/Fri - last taken 5/8 (dose recently increased 4/26 at coumadin clinic for INR 1.8)  Goal of Therapy:  INR 2-3 Heparin level 0.3-0.7 units/ml Monitor platelets by anticoagulation protocol: Yes   Plan:  1) Continue heparin at 1350 units/hr 2) Increase coumadin to 10mg  x 1 3) Daily heparin level, INR, CBC  Fredrik RiggerMarkle, Raeden Schippers Sue 10/31/2015,9:55 AM

## 2015-10-31 NOTE — Clinical Social Work Note (Signed)
Clinical Social Worker continuing to follow patient and family for support and discharge planning needs.  CSW spoke with patient daughter and wife outside of patient room to provide bed offers.  [facility], however have changed their minds and are hopeful for [facility].  CSW has [facility] a [facility] to verify potential [facility].  CSW remains available for support and will initiate insurance authorization tomorrow for possible weekend discharge.  Adam GoldsJesse Jaida Basurto, [place]CS[place] [phone]

## 2015-10-31 NOTE — Care Management Note (Signed)
Case Management Note  Patient Details  Name: Adam PasseCharles D Chopra, Adam [name] MRN: [id number] Date of Birth: [birth year]  Subjective/Objective:     Acute metabolic encephalopathy / urinary tract infection               Action/Plan: Discharge Planning:  NCM spoke to pt and dtr, [name] # [phone] at bedside. She had preferences for SNF, [facility], [facility] and [facility]. [facility]. Explained CSW following for SNF placement.    Expected Discharge Date:                  Expected Discharge Plan:  Skilled Nursing Facility  In-House Referral:  Clinical Social Work  Discharge planning Services  CM Consult  Post Acute Care Choice:  NA Choice offered to:  NA  DME Arranged:  N/A DME Agency:  NA  HH Arranged:  NA HH Agency:  NA  Status of Service:  Completed, signed off  Medicare Important Message Given:    Date Medicare IM Given:    Medicare IM give by:    Date Additional Medicare IM Given:    Additional Medicare Important Message give by:     If discussed at Long Length of Stay Meetings, dates discussed:    Additional Comments:  Elliot CousinShavis, Katheleen Stella Ellen, RN 10/31/2015, 3:30 PM

## 2015-11-01 DIAGNOSIS — N390 Urinary tract infection, site not specified: Principal | ICD-10-CM

## 2015-11-01 LAB — GLUCOSE, CAPILLARY
GLUCOSE-CAPILLARY: 168 mg/dL — AB (ref 65–99)
GLUCOSE-CAPILLARY: 155 mg/dL — AB (ref 65–99)
Glucose-Capillary: 124 mg/dL — ABNORMAL HIGH (ref 65–99)
Glucose-Capillary: 209 mg/dL — ABNORMAL HIGH (ref 65–99)

## 2015-11-01 LAB — CBC
HEMATOCRIT: 41.2 % (ref 39.0–52.0)
Hemoglobin: 13.2 g/dL (ref 13.0–17.0)
MCH: 27.2 pg (ref 26.0–34.0)
MCHC: 32 g/dL (ref 30.0–36.0)
MCV: 84.8 fL (ref 78.0–100.0)
Platelets: 297 10*3/uL (ref 150–400)
RBC: 4.86 MIL/uL (ref 4.22–5.81)
RDW: 13.5 % (ref 11.5–15.5)
WBC: 6.9 10*3/uL (ref 4.0–10.5)

## 2015-11-01 LAB — PROTIME-INR
INR: 1.38 (ref 0.00–1.49)
Prothrombin Time: 17.1 seconds — ABNORMAL HIGH (ref 11.6–15.2)

## 2015-11-01 LAB — HEPARIN LEVEL (UNFRACTIONATED): Heparin Unfractionated: 0.53 IU/mL (ref 0.30–0.70)

## 2015-11-01 MED ORDER — WARFARIN SODIUM 10 MG PO TABS
10.0000 mg | ORAL_TABLET | Freq: Once | ORAL | Status: AC
  Administered 2015-11-01: 10 mg via ORAL
  Filled 2015-11-01: qty 1

## 2015-11-01 NOTE — Care Management Important Message (Signed)
Important Message  Patient Details  Name: Adam PasseCharles D Dobberstein, [name] MRN: [id number] Date of Birth: 1934/12/03   Medicare Important Message Given:  Yes    [name], [name] 11/01/2015, 11:52 AM

## 2015-11-01 NOTE — Progress Notes (Signed)
Report received via [place] RN in patient's room using [facility] Homes[facility]AR format, reviewed labs, VS, meds, orders and patient's general condition, assumed care of patient.

## 2015-11-01 NOTE — Progress Notes (Signed)
ANTICOAGULATION CONSULT NOTE - Follow Up Consult  Pharmacy Consult for Heparin and Coumadin Indication: aflutter and aortic valve  Allergies  Allergen Reactions  . Doxycycline Swelling    swollen tongue     Patient Measurements: Weight: 159 lb 1.6 oz (72.167 kg)  Vital Signs: Temp: 97.9 F (36.6 C) (05/12 0600) Temp Source: Oral (05/12 0600) BP: 128/60 mmHg (05/12 0600) Pulse Rate: 85 (05/12 0600)  Labs:  Recent Labs  10/30/15 0533  10/30/15 0813 10/30/15 1249 10/31/15 0505 11/01/15 0533  HGB 14.8  --   --   --  13.8 13.2  HCT 44.9  --   --   --  41.9 41.2  PLT 306  --   --   --  313 297  LABPROT 15.2  --   --   --  14.8 17.1*  INR 1.19  --   --   --  1.14 1.38  HEPARINUNFRC  --   < >  --  0.65 0.61 0.53  CREATININE  --   --  0.75  --   --   --   < > = values in this interval not displayed.  Estimated Creatinine Clearance: 66.2 mL/min (by C-G formula based on Cr of 0.75).   Medications:  Heparin @ 1350 units/hr  Assessment: 80yom on coumadin pta for aflutter and bioprosthetic aortic valve. INR on admission > 10 and he was given vitamin k 5mg  IV on 5/8. INR down to 1.39 on 5/9 so heparin bridge added and coumadin resumed. Heparin level is therapeutic at 0.53. INR finally starting to trend up 1.38. CBC stable. No bleeding.  Home dose: 7.5mg  daily except 5mg  on Mon/Fri - last taken 5/8 (dose recently increased 4/26 at coumadin clinic for INR 1.8)  Goal of Therapy:  INR 2-3 Heparin level 0.3-0.7 units/ml Monitor platelets by anticoagulation protocol: Yes   Plan:  1) Continue heparin at 1350 units/hr 2) Repeat coumadin 10mg  x 1 3) Daily heparin level, INR, CBC  Adam RiggerMarkle, Adam [name] 11/01/2015,8:50 AM

## 2015-11-01 NOTE — Progress Notes (Signed)
Physical Therapy Treatment Patient Details Name: Adam MISNER, [name] MRN: [id number] DOB: [birth year] Today's Date: 11/01/2015    History of Present Illness 81 y.o. male admitted to [facility] on 10/28/15 for increased confusion.  Pt dx with acute encephalopathy with unknown origin.  His head CT was negative for a bleed, MRI cannot be done due to pacemaker, and urine cultures are pending.  His INR on admission was >10.  Pt with significant PMhx of HTN, DM2, A-flutter, CAD, aortic stenosis, DJD, DDD, spinal stenosis of the lumbar region, atrial tacycardia, s/p AVR, dementia following hypoxic-ischemic injury, complete heart block,  CABG, R leg surgery, back surgery, and pacemaker.      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  He was able to walk further into the hallway, although still very distracted by busy stimulating hallway.  He did better without RW today as this is a novel task it actually served more as an obstacle vs. An assistance.  He also had difficulty focusing enough to finish doing upper and lower extremity exercises (with TV off) and needed max verbal and visual cues to complete.  He continues to be appropriate for SNF level rehab at discharge.    Follow Up Recommendations  SNF     Equipment Recommendations  None recommended by PT    Recommendations for Other Services   NA     Precautions / Restrictions Precautions Precautions: Fall Precaution Comments: pt is a high fall risk due to instability and cognitive deficits    Mobility  Bed Mobility               General bed mobility comments: pt seated in recliner chair in room, daughter present and helping with IV pole   Transfers Overall transfer level: Needs assistance Equipment used: 1 person hand held assist Transfers: Sit to/from Stand Sit to Stand: Min guard         General transfer comment: Min guard assist for safety, reliance on legs supported against recliner chair for balance.    Ambulation/Gait Ambulation/Gait assistance: Min assist Ambulation Distance (Feet): 180 Feet Assistive device: 1 person hand held assist Gait Pattern/deviations: Step-through pattern;Shuffle Gait velocity: decreased Gait velocity interpretation: <1.8 ft/sec, indicative of risk for recurrent falls General Gait Details: Pt with short, shuffling gait (choppy almost) which increases as he becomes distracted looking at objects and reading things in the hallway.  Verbal cues for "big steps" worked for a few seconds and then would have to be repeated, also gave visual cues, "take big steps like me" however, that made him more unsteady to look down than it was helpful.           Balance Overall balance assessment: Needs assistance Sitting-balance support: Feet supported;No upper extremity supported Sitting balance-Leahy Scale: Fair     Standing balance support: Bilateral upper extremity supported;No upper extremity supported Standing balance-Leahy Scale: Poor                      Cognition Arousal/Alertness: Awake/alert Behavior During Therapy: Restless Overall Cognitive Status: Impaired/Different from baseline Area of Impairment: Orientation;Attention;Memory;Following commands;Safety/judgement;Awareness;Problem solving Orientation Level: Disoriented to;Time;Situation Current Attention Level: Sustained (brefily sustained) Memory: Decreased short-term memory Following Commands: Follows one step commands with increased time Safety/Judgement: Decreased awareness of safety;Decreased awareness of deficits Awareness: Intellectual Problem Solving: Slow processing;Difficulty sequencing;Requires verbal cues;Requires tactile cues General Comments: Pt slow to process, easily distracted in busy hallway.  Needs almost continuous cues and feedback to  sustain exercising task    Exercises General Exercises - Upper Extremity Shoulder Flexion: AROM;Both;10 reps;Seated Elbow Flexion: AROM;10  reps;Seated;Both General Exercises - Lower Extremity Long Arc Quad: AROM;Both;10 reps;Seated Hip Flexion/Marching: AROM;Both;10 reps;Seated Toe Raises: AROM;Both;20 reps;Seated Heel Raises: AROM;Both;20 reps;Seated        Pertinent Vitals/Pain Pain Assessment: No/denies pain Pain Score: 0-No pain           PT Goals (current goals can now be found in the care plan section) Acute Rehab PT Goals Patient Stated Goal: family wouldl like for him to go to rehab and get a little stronger/clearer Progress towards PT goals: Progressing toward goals    Frequency  Min 2X/week (per departmental protocols, would benefit from more )    PT Plan Frequency needs to be updated       End of Session Equipment Utilized During Treatment: Gait belt Activity Tolerance: Patient limited by fatigue Patient left: in chair;with call bell/phone within reach;with family/visitor present     Time: [id number] PT Time Calculation (min) (ACUTE ONLY): 26 min  Charges:  $Gait Training: 8-22 mins $Therapeutic Exercise: 8-22 mins                     Deasiah Hagberg B. Danetra Glock, PT, DPT [phone]   11/01/2015, 4:07 PM

## 2015-11-01 NOTE — Clinical Social Work Note (Signed)
Clinical Social Worker continuing to follow patient and family for support and discharge planning needs.  Patient family has chosen a bed at [facility] received Berkley Harvey(auth # [id number]).  [facility] with available bed on Saturday if patient medically stable for discharge - MD updated.  CSW remains available for support and to facilitate patient discharge needs once medically ready.  Macario GoldsJesse Alliene Klugh, [place]CS[place] [phone]

## 2015-11-01 NOTE — Progress Notes (Addendum)
Patient ID: Adam Passe, [name], male   DOB: Jul 13, 1934, 81 y.o.   MRN: [id number]  PROGRESS NOTE    Adam Passe, [name]  WUJ:[id number] DOB: 03-18-1935 DOA: 10/28/2015  PCP: Garlan Fillers, MD   Brief Narrative:  81 y.o. male with medical history significant for atrial fibrillation, dyslipidemia, pacemaker, coronary artery disease status post CABG in 2003, vascular dementia who presented to [facility] with progressive confusion for past few days prior to this admission. Patient was seen by neurology during this hospital stay. CT head was unremarkable. Unable to obtain MRI because patient has pacemaker.  Of note, I spent about 30 minutes talking to patient's daughter over the phone. She expressed her frustration that her father is not getting better. She asked why are we not doing anything in regards to treating the possible stroke. I explained that PT has seen [name] and recommended SNF placement. Pt daughter said" well why aren't PT folks seeing him every day"?. She also asked why no swallow eval done. I explained to her so far no reports of possible aspirations but she requested SLP evaluation and speech therapy. We also repeated CT scan per family request to monitor for brain bleed but no acute findings on CT head. Pt daughter has an expectation that her father should be at his baseline mental status before discharge. I again explained we are not sure why is his mental stats still the same but part of it is because we cant do MRI to further troubleshoot the etiology of his presentation.   Assessment & Plan:  Acute metabolic encephalopathy / urinary tract infection - Ssecondary to urinary tract infection, treated with Rocephin for 4 days, stopped 10/31/2015 - Urine culture with insignificant growth - No acute intracranial findings on CT scan. Repeat CT scan with no acute findings. Patient is unable to do MRI because he has pacemaker. - No further work up from neurology  standpoint - Obtain SLP eval  and PT re-eval per pt daughter request.  Chronic atrial fibrillation / heart block / status post pacemaker - CHADS vasc score 4  - On anticoagulation with heparin drip and Coumadin till INR 2-3 - Off note, INR on the admission more than 10 so given vitamin K in emergency room  Essential hypertension - Continue losartan 100 mg daily and metoprolol 25 mg twice daily  Aortic stenosis status post mechanical valve replacement (2014) - On Coumadin - On heparin and coumadin - INR 1.38 this am   Coronary artery disease status post CABG - Chest pain-free - Continue aspirin and heparin drip along with coumadin - Monitor for bleeding   Back pain - Lumbar spine x-ray with no acute fractures   Diabetes mellitusWith peripheral circulatory complications with long term insulin use - A1c on this admission, 6.4 indicating good glycemic control - Continue Lantus 20 units at bedtime along with sliding scale insulin  Vascular dementia - Continue memantine - Will check with neuro if anything else we can do to evaluate why mental status not better  COPD - Stable    DVT prophylaxis: Full dose anticoagulation with Coumadin Code Status: DNR/DNI Family Communication: Pt daughter at the bedside this morning Disposition Plan: to SNF once INR at therapeutic level, anticipate by Monday 5/15   Consultants:   Neurology  Procedures:   None   Antimicrobials:   Rocephin 10/28/2015 --> 10/31/2015   Subjective: No overnight events.  Objective: Filed Vitals:   10/31/15 2200 11/01/15 0600 11/01/15 0837 11/01/15 1405  BP: 137/86 128/60  136/49  Pulse: 104 85  72  Temp: 98.1 F (36.7 C) 97.9 F (36.6 C)  97.5 F (36.4 C)  TempSrc: Oral Oral  Oral  Resp:  18  16  Weight:  72.167 kg (159 lb 1.6 oz)    SpO2: 92% 94% 98% 95%    Intake/Output Summary (Last 24 hours) at 11/01/15 1608 Last data filed at 11/01/15 1405  Gross per 24 hour  Intake   1220 ml    Output      0 ml  Net   1220 ml   Filed Weights   10/30/15 0400 10/31/15 0500 11/01/15 0600  Weight: 72.167 kg (159 lb 1.6 oz) 72.485 kg (159 lb 12.8 oz) 72.167 kg (159 lb 1.6 oz)    Examination:  General exam: Appears in no acute distress  Respiratory system: Bilateral air entry, no wheezing  Cardiovascular system: S1 & S2 appreciated, rate controlled   Gastrointestinal system: (+) BS, non distended, non tender  Central nervous system: Nonfocal  Extremities: no tenderness, palpable pulses  Skin: no lesions, no ulcers  Psychiatry: no agitation, no restlessness      Data Reviewed: I have personally reviewed following labs and imaging studies  CBC:  Recent Labs Lab 10/28/15 1311 10/29/15 0517 10/30/15 0533 10/31/15 0505 11/01/15 0533  WBC 9.9 7.6 8.0 7.1 6.9  HGB 14.2 14.0 14.8 13.8 13.2  HCT 43.6 43.2 44.9 41.9 41.2  MCV 87.2 86.7 86.8 86.4 84.8  PLT 310 294 306 313 297   Basic Metabolic Panel:  Recent Labs Lab 10/28/15 1311 10/29/15 0517 10/30/15 0813  NA 140 140 142  K 3.9 3.3* 3.5  CL 103 101 103  CO2 GLUCOSE 157* 105* 144*  BUN CREATININE 0.79 0.63 0.75  CALCIUM 8.9 8.6* 9.1  MG  --  1.9  --   PHOS  --  3.6  --    GFR: Estimated Creatinine Clearance: 66.2 mL/min (by C-G formula based on Cr of 0.75). Liver Function Tests:  Recent Labs Lab 10/28/15 1311 10/29/15 0517  AST 25 29  ALT 27 36  ALKPHOS 74 69  BILITOT 0.8 1.2  PROT 7.2 6.3*  ALBUMIN 3.4* 3.1*   No results for input(s): LIPASE, AMYLASE in the last 168 hours.  Recent Labs Lab 10/29/15 0857  AMMONIA 17   Coagulation Profile:  Recent Labs Lab 10/29/15 0517 10/29/15 1745 10/30/15 0533 10/31/15 0505 11/01/15 0533  INR 1.39 1.24 1.19 1.14 1.38   Cardiac Enzymes: No results for input(s): CKTOTAL, CKMB, CKMBINDEX, TROPONINI in the last 168 hours. BNP (last 3 results) No results for input(s): PROBNP in the last 8760 hours. HbA1C: No results for  input(s): HGBA1C in the last 72 hours. CBG:  Recent Labs Lab 10/31/15 1141 10/31/15 1607 10/31/15 2159 11/01/15 0745 11/01/15 1123  GLUCAP 155* 122* 144* 124* 168*   Lipid Profile: No results for input(s): CHOL, HDL, LDLCALC, TRIG, CHOLHDL, LDLDIRECT in the last 72 hours. Thyroid Function Tests: No results for input(s): TSH, T4TOTAL, FREET4, T3FREE, THYROIDAB in the last 72 hours. Anemia Panel: No results for input(s): VITAMINB12, FOLATE, FERRITIN, TIBC, IRON, RETICCTPCT in the last 72 hours. Urine analysis:    Component Value Date/Time   COLORURINE AMBER* 10/28/2015 1612   APPEARANCEUR CLEAR 10/28/2015 1612   LABSPEC 1.024 10/28/2015 1612   PHURINE 6.5 10/28/2015 1612   GLUCOSEU NEGATIVE 10/28/2015 1612   HGBUR NEGATIVE 10/28/2015 1612   BILIRUBINUR SMALL* 10/28/2015 1612  KETONESUR 15* 10/28/2015 1612   PROTEINUR NEGATIVE 10/28/2015 1612   UROBILINOGEN 1.0 11/27/2013 1310   NITRITE NEGATIVE 10/28/2015 1612   LEUKOCYTESUR TRACE* 10/28/2015 1612   Sepsis Labs: @LABRCNTIP (procalcitonin:4,lacticidven:4)    Recent Results (from the past 240 hour(s))  Culture, Urine     Status: Abnormal   Collection Time: 10/28/15  4:12 PM  Result Value Ref Range Status   Specimen Description URINE, RANDOM  Final   Special Requests NONE  Final   Culture 7,000 COLONIES/mL INSIGNIFICANT GROWTH (A)  Final   Report Status 10/30/2015 FINAL  Final      Radiology Studies: Dg Lumbar Spine 2-3 Views 10/28/2015   No acute abnormality in lumbar spine.   Ct Head Wo Contrast 10/28/2015 1. No acute intracranial abnormalities. 2. Chronic microvascular disease and brain atrophy.    Scheduled Meds: . aspirin  325 mg Oral QHS  . cilostazol  50 mg Oral BID  . insulin aspart  0-9 Units Subcutaneous TID WC  . insulin glargine  20 Units Subcutaneous QHS  . losartan  100 mg Oral Daily  . Melatonin  3 mg Oral QHS  . memantine  10 mg Oral BID  . metoprolol tartrate  25 mg Oral BID  .  mometasone-formoterol  2 puff Inhalation BID  . multivitamin with minerals  1 tablet Oral Daily  . senna-docusate  2 tablet Oral BID  . tamsulosin  0.4 mg Oral QHS  . venlafaxine  150 mg Oral Q breakfast  . venlafaxine  75 mg Oral Q supper  . warfarin  10 mg Oral ONCE-1800  . Warfarin - Pharmacist Dosing Inpatient   Does not apply q1800   Continuous Infusions: . heparin 1,350 Units/hr (10/31/15 2015)     LOS: 3 days    Time spent: 25 minutes  Greater than 50% of the time spent on counseling and coordinating the care.   Manson PasseyEVINE, Ilayda Toda, MD [facility] Pager [phone]  If 7PM-7AM, please contact night-coverage [email or web address] Password [email or web address] 11/01/2015, 4:08 PM

## 2015-11-02 LAB — BASIC METABOLIC PANEL
ANION GAP: 8 (ref 5–15)
BUN: 12 mg/dL (ref 6–20)
CALCIUM: 8.6 mg/dL — AB (ref 8.9–10.3)
CO2: 27 mmol/L (ref 22–32)
CREATININE: 0.83 mg/dL (ref 0.61–1.24)
Chloride: 103 mmol/L (ref 101–111)
Glucose, Bld: 159 mg/dL — ABNORMAL HIGH (ref 65–99)
Potassium: 3.3 mmol/L — ABNORMAL LOW (ref 3.5–5.1)
SODIUM: 138 mmol/L (ref 135–145)

## 2015-11-02 LAB — HEPARIN LEVEL (UNFRACTIONATED): Heparin Unfractionated: 0.67 IU/mL (ref 0.30–0.70)

## 2015-11-02 LAB — CBC
HCT: 41.6 % (ref 39.0–52.0)
HCT: 42.7 % (ref 39.0–52.0)
Hemoglobin: 13.4 g/dL (ref 13.0–17.0)
Hemoglobin: 13.5 g/dL (ref 13.0–17.0)
MCH: 27.9 pg (ref 26.0–34.0)
MCH: 27.4 pg (ref 26.0–34.0)
MCHC: 32.2 g/dL (ref 30.0–36.0)
MCHC: 31.6 g/dL (ref 30.0–36.0)
MCV: 86.5 fL (ref 78.0–100.0)
MCV: 86.8 fL (ref 78.0–100.0)
PLATELETS: 292 10*3/uL (ref 150–400)
PLATELETS: 292 10*3/uL (ref 150–400)
RBC: 4.81 MIL/uL (ref 4.22–5.81)
RBC: 4.92 MIL/uL (ref 4.22–5.81)
RDW: 13.5 % (ref 11.5–15.5)
RDW: 13.8 % (ref 11.5–15.5)
WBC: 7.5 10*3/uL (ref 4.0–10.5)
WBC: 7.6 10*3/uL (ref 4.0–10.5)

## 2015-11-02 LAB — GLUCOSE, CAPILLARY: Glucose-Capillary: 138 mg/dL — ABNORMAL HIGH (ref 65–99)

## 2015-11-02 LAB — PROTIME-INR
INR: 1.85 — AB (ref 0.00–1.49)
PROTHROMBIN TIME: 21.3 s — AB (ref 11.6–15.2)

## 2015-11-02 MED ORDER — VENLAFAXINE HCL 75 MG PO TABS: 75.0000 mg | ORAL_TABLET | Freq: Every day | ORAL | Status: AC

## 2015-11-02 MED ORDER — INSULIN ASPART 100 UNIT/ML ~~LOC~~ SOLN: 0.0000 [IU] | Freq: Three times a day (TID) | SUBCUTANEOUS | Status: DC

## 2015-11-02 MED ORDER — ENOXAPARIN SODIUM 80 MG/0.8ML ~~LOC~~ SOLN: 70.0000 mg | Freq: Two times a day (BID) | SUBCUTANEOUS | Status: DC

## 2015-11-02 MED ORDER — SENNOSIDES-DOCUSATE SODIUM 8.6-50 MG PO TABS: 2.0000 | ORAL_TABLET | Freq: Two times a day (BID) | ORAL | Status: AC

## 2015-11-02 MED ORDER — WARFARIN SODIUM 7.5 MG PO TABS: 7.5000 mg | ORAL_TABLET | Freq: Every day | ORAL | Status: DC

## 2015-11-02 MED ORDER — ASPIRIN 325 MG PO TABS: 325.0000 mg | ORAL_TABLET | Freq: Every day | ORAL | Status: DC

## 2015-11-02 NOTE — Clinical Social Work Placement (Signed)
   CLINICAL SOCIAL WORK PLACEMENT  NOTE  Date:  11/02/2015  Patient Details  Name: Adam PasseCharles D Mcelwee, [name] MRN: [id number] Date of Birth: 1934/09/08  Clinical Social Work is seeking post-discharge placement for this patient at the Skilled  Nursing Facility level of care (*CSW will initial, date and re-position this form in  chart as items are completed):  Yes   Patient/family provided with [facility] Clinical Social Work Department's list of facilities offering this level of care within the geographic area requested by the patient (or if unable, by the patient's family).  Yes   Patient/family informed of their freedom to choose among providers that offer the needed level of care, that participate in Medicare, Medicaid or managed care program needed by the patient, have an available bed and are willing to accept the patient.  Yes   Patient/family informed of [facility]'s ownership interest in [facility] and [facility], as well as of the fact that they are under no obligation to receive care at these facilities.  PASRR submitted to EDS on 10/29/15     PASRR number received on 10/29/15     Existing PASRR number confirmed on       FL2 transmitted to all facilities in geographic area requested by pt/family on 10/29/15     FL2 transmitted to all facilities within larger geographic area on       Patient informed that his/her managed care company has contracts with or will negotiate with certain facilities, including the following:        Yes   Patient/family informed of bed offers received.  [facility]     Physician recommends and patient chooses bed at      [facility].  Patient to be transferred to facility by Daughter will bring by car     Patient family notified on 11/02/15 of transfer.  Name of family member notified:  Daughter     PHYSICIAN       Additional Comment:     _______________________________________________ Mearl LatinNadia S Tymira Horkey, LCSWA 11/02/2015, 10:15 AM

## 2015-11-02 NOTE — Evaluation (Signed)
Clinical/Bedside Swallow Evaluation Patient Details  Name: Adam PasseCharles D Towell, [name] MRN: [id number] Date of Birth: May 23, 1935  Today's Date: 11/02/2015 Time: SLP Start Time (ACUTE ONLY): 0945 SLP Stop Time (ACUTE ONLY): 1000 SLP Time Calculation (min) (ACUTE ONLY): 15 min  Past Medical History:  Past Medical History  Diagnosis Date  . Hypertension   . Hyperlipidemia   . Type II or unspecified type diabetes mellitus without mention of complication, not stated as uncontrolled   . Nephrolithiasis   . Atrial flutter [facility](HCC)     s/p CTI ablation by Dr Ladona Ridgelaylor 1/12  . CAD (coronary artery disease)     s/p CABG x 5 2003 by Dr Laneta SimmersBartle  . Aortic stenosis, moderate     moderate to severe per echo 03/2012 with AVR in March of 2014  . DJD (degenerative joint disease)   . DDD (degenerative disc disease)   . Spinal stenosis of lumbar region   . Bifascicular block   . Atrial tachycardia (HCC)   . Anxiety   . Depression   . S/P AVR (aortic valve replacement)   . OSA (obstructive sleep apnea)   . 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 Diverticulosis   . Hemorrhoids   . Complete heart block (HCC) 08/21/15    [facility]J PPM, Dr. Johney FrameAllred   Past Surgical History:  Past Surgical History  Procedure Laterality Date  . Cabg  2003    by Dr Laneta SimmersBartle  . Anal fissure repair    . Leg surgery      right  . Trigger finger release      bilat.  . Tonsillectomy    . Back surgery  2010  . Atrial ablation surgery  1/12    atrial flutter ablation by Dr Ladona Ridgelaylor  . Cardioversion  04/07/2012    Procedure: CARDIOVERSION;  Surgeon: Wendall StadePeter C Nishan, MD;  [facility];  Service: Cardiovascular;  Laterality: N/A;  . Coronary artery bypass graft      10 yrs ago   03  . Aortic valve replacement N/A 09/01/2012    Procedure: AORTIC VALVE REPLACEMENT (AVR);  Surgeon: Alleen BorneBryan K Bartle, MD;  [facility];  Service: Open Heart Surgery;  Laterality: N/A;  . Intraoperative transesophageal echocardiogram  N/A 09/01/2012    Procedure: INTRAOPERATIVE TRANSESOPHAGEAL ECHOCARDIOGRAM;  Surgeon: Alleen BorneBryan K Bartle, MD;  [facility];  Service: Open Heart Surgery;  Laterality: N/A;  . Ep implantable device N/A 08/21/2015    STJ PPM, Dr. Johney FrameAllred   HPI:  81 [facility].o. [facility] for increased confusion. Pt dx with acute encephalopathy with unknown origin. His head CT was negative for a bleed, MRI cannot be done due to pacemaker, and urine cultures are pending. His INR on admission was >10. Pt with significant PMhx of HTN, DM2, A-flutter, CAD, aortic stenosis, DJD, DDD, spinal stenosis of the lumbar region, atrial tacycardia, s/p AVR, dementia following hypoxic-ischemic injury, complete heart block, CABG, R leg surgery, back surgery, and pacemaker.    Assessment / Plan / Recommendation Clinical Impression  SLP swallow evaluation requested by family.  Pt presents with normal oropharyngeal swallow function with adequate mastication, brisk swallow response, no overt s/s of aspiration; passed three oz water test without deficit.  No focal CN deficits. Spoke with pt's daughter about fluctuating nature of swallow in the context of encephalopathy and dementia.  She verbalizes understanding.  No SLP f/u warranted.     Aspiration Risk  No limitations    Diet Recommendation  regular, thin liquids.  Medication Administration: Whole meds with liquid    Other  Recommendations     Follow up Recommendations       Frequency and Duration            Prognosis        Swallow Study   General Date of Onset: 10/28/15 HPI: 81 y.o. male admitted to [facility] on 10/28/15 for increased confusion. Pt dx with acute encephalopathy with unknown origin. His head CT was negative for a bleed, MRI cannot be done due to pacemaker, and urine cultures are pending. His INR on admission was >10. Pt with significant PMhx of HTN, DM2, A-flutter, CAD, aortic stenosis, DJD, DDD, spinal stenosis of the lumbar region, atrial  tacycardia, s/p AVR, dementia following hypoxic-ischemic injury, complete heart block, CABG, R leg surgery, back surgery, and pacemaker.  Type of Study: Bedside Swallow Evaluation Previous Swallow Assessment: no Diet Prior to this Study: Regular;Thin liquids Temperature Spikes Noted: No Respiratory Status: Room air History of Recent Intubation: No Behavior/Cognition: Alert;Cooperative;Confused Oral Cavity Assessment: Within Functional Limits Oral Care Completed by SLP: No Oral Cavity - Dentition: Dentures, top;Dentures, bottom Vision: Functional for self-feeding Self-Feeding Abilities: Able to feed self Patient Positioning: Upright in chair Baseline Vocal Quality: Normal Volitional Cough: Strong Volitional Swallow: Able to elicit    Oral/Motor/Sensory Function Overall Oral Motor/Sensory Function: Within functional limits   Ice Chips Ice chips: Not tested   Thin Liquid Thin Liquid: Within functional limits Presentation: Straw    Nectar Thick Nectar Thick Liquid: Not tested   Honey Thick Honey Thick Liquid: Not tested   Puree Puree: Within functional limits Presentation: Self Fed;Spoon   Solid   GO   Solid: Within functional limits Presentation: Self Fed        Blenda Mounts Laurice 11/02/2015,10:07 AM

## 2015-11-02 NOTE — Progress Notes (Signed)
Patient will DC to: [facility] Anticipated DC date: 11/02/15 Family notified: Daughter Transport by: car   Per MD patient ready for [facility]. RN, patient, patient's family, and facility notified of DC. RN given number for report.   CSW signing off.  Cristobal GoldmannNadia Ashely Goosby, [place]WA Clinical Social Worker [phone]

## 2015-11-02 NOTE — Discharge Summary (Signed)
Physician Discharge Summary  Adam Passe, [name] ZOX:[id number] DOB: June 08, 1935 DOA: 10/28/2015  PCP: Garlan Fillers, MD  Admit date: 10/28/2015 Discharge date: 11/02/2015  Recommendations for Outpatient Follow-up:  Take Lovenox 70 mg SubQ twice a day along with coumadin 7.5 mg daily until INR 2-3. Once INR 2-3 then stop Lovenox and continue coumadin only.  Continue physical therapy daily Obtain another evaluation of swallowing and speech in SNF in about 1 -2 weeks after discharge.  Discharge Diagnoses:  Active Problems:   Diabetes mellitus (HCC)   Essential hypertension   COPD (chronic obstructive pulmonary disease) (HCC)   CAD (coronary artery disease)   Atrial fibrillation (HCC)   Dementia following hypoxic-ischemic injury (HCC)   Vascular dementia   Acute encephalopathy   Supratherapeutic INR   Fall    Discharge Condition: stable   Diet recommendation: as tolerated   History of present illness:  81 y.o. male with medical history significant for atrial fibrillation, dyslipidemia, pacemaker, coronary artery disease status post CABG in 2003, vascular dementia who presented to [facility] with progressive confusion for past few days prior to this admission. Patient was seen by neurology during this hospital stay. CT head was unremarkable. Unable to obtain MRI because patient has pacemaker.   Hospital Course:   Assessment & Plan:  Acute metabolic encephalopathy / urinary tract infection - Ssecondary to urinary tract infection, treated with Rocephin for 4 days, stopped 10/31/2015 since urine culture with insignificant growth - No acute intracranial findings on CT scan. Repeat CT scan with no acute findings. Patient is unable to do MRI because he has pacemaker. - No further work up from neurology standpoint - Obtain SLP eval prior to discharge plan   Chronic atrial fibrillation / heart block / status post pacemaker - CHADS vasc score 4  - On anticoagulation with  heparin drip and Coumadin. INR 1.8 this am.  - She will continue Lovenox 70 mg subcutaneously twice daily along with Coumadin 7.5 mg daily until INR at least 2 at which point continue all the Coumadin and adjust the dose to reflect INR range 2-3   Essential hypertension - Continue losartan 100 mg daily and metoprolol 25 mg twice daily  Aortic stenosis status post mechanical valve replacement (2014) - On Coumadin, goal INR 2-3  Coronary artery disease status post CABG - Chest pain-free - Continue aspirin   Back pain - Lumbar spine x-ray with no acute fractures   Diabetes mellitusWith peripheral circulatory complications with long term insulin use - A1c on this admission, 6.4 indicating good glycemic control - Continue Lantus 20 units at bedtime along with sliding scale insulin  Vascular dementia - Continue memantine - Mental status better   COPD - Stable    DVT prophylaxis: Full dose anticoagulation with Coumadin Code Status: DNR/DNI Family Communication: Pt daughter at the bedside this morning    Consultants:   Neurology  Procedures:   None  Antimicrobials:   Rocephin 10/28/2015 --> 10/31/2015   Signed:  Manson Passey, MD  [facility] 11/02/2015, 9:31 AM  Pager #: [phone]  Time spent in minutes: more than 30 minutes  Discharge Exam: Filed Vitals:   11/01/15 2203 11/02/15 0546  BP:  122/41  Pulse: 93 86  Temp:  98 F (36.7 C)  Resp: 18 16   Filed Vitals:   11/01/15 2051 11/01/15 2149 11/01/15 2203 11/02/15 0546  BP: 148/60 148/60  122/41  Pulse: 79 79 93 86  Temp: 99.1 F (37.3 C)   98 F (  36.7 C)  TempSrc:    Oral  Resp: 16  18 16   Weight:    72.2 kg (159 lb 2.8 oz)  SpO2: 94%  96% 95%    General: Pt is alert, follows commands appropriately, not in acute distress Cardiovascular: Regular rate and rhythm, S1/S2 + Respiratory: Clear to auscultation bilaterally, no wheezing, no crackles, no rhonchi Abdominal: Soft, non tender,  non distended, bowel sounds +, no guarding Extremities: no cyanosis, pulses palpable bilaterally DP and PT Neuro: Grossly nonfocal  Discharge Instructions  Discharge Instructions    Call MD for:  difficulty breathing, headache or visual disturbances    Complete by:  As directed      Call MD for:  persistant dizziness or light-headedness    Complete by:  As directed      Call MD for:  persistant nausea and vomiting    Complete by:  As directed      Call MD for:  severe uncontrolled pain    Complete by:  As directed      Diet - low sodium heart healthy    Complete by:  As directed      Discharge instructions    Complete by:  As directed   Take Lovenox 70 mg SubQ twice a day along with coumadin 7.5 mg daily until INR 2-3. Once INR 2-3 then stop Lovenox and continue coumadin only.  Continue physical therapy daily Obtain another evaluation of swallowing and speech in SNF in about 1 -2 weeks after discharge.     Increase activity slowly    Complete by:  As directed             Medication List    STOP taking these medications        aspirin 325 MG EC tablet  Replaced by:  aspirin 325 MG tablet     HUMALOG KWIKPEN 100 UNIT/ML KiwkPen  Generic drug:  insulin lispro     ibuprofen 200 MG tablet  Commonly known as:  ADVIL,MOTRIN     metFORMIN 500 MG 24 hr tablet  Commonly known as:  GLUCOPHAGE-XR      TAKE these medications        albuterol 108 (90 Base) MCG/ACT inhaler  Commonly known as:  PROVENTIL HFA;VENTOLIN HFA  Inhale 2 puffs into the lungs every 6 (six) hours as needed for wheezing.     aspirin 325 MG tablet  Take 1 tablet (325 mg total) by mouth at bedtime.     budesonide-formoterol 160-4.5 MCG/ACT inhaler  Commonly known as:  SYMBICORT  Inhale 2 puffs into the lungs 2 (two) times daily.     cilostazol 50 MG tablet  Commonly known as:  PLETAL  Take 50 mg by mouth 2 (two) times daily.     enoxaparin 80 MG/0.8ML injection  Commonly known as:  LOVENOX  Inject  0.7 mLs (70 mg total) into the skin every 12 (twelve) hours.     insulin aspart 100 UNIT/ML injection  Commonly known as:  novoLOG  Inject 0-9 Units into the skin 3 (three) times daily with meals.     insulin glargine 100 unit/mL Sopn  Commonly known as:  LANTUS  Inject 20 Units into the skin at bedtime.     losartan 100 MG tablet  Commonly known as:  COZAAR  Take 1 tablet (100 mg total) by mouth daily.     Melatonin 3 MG Caps  Take 3 mg by mouth at bedtime. Reported on 09/04/2015  memantine 10 MG tablet  Commonly known as:  NAMENDA  Take 1 tablet (10 mg total) by mouth 2 (two) times daily.     metoprolol tartrate 25 MG tablet  Commonly known as:  LOPRESSOR  Take 1 tablet (25 mg total) by mouth 2 (two) times daily.     multivitamin with minerals tablet  Take 1 tablet by mouth daily.     nitroGLYCERIN 0.4 MG SL tablet  Commonly known as:  NITROSTAT  Place 0.4 mg under the tongue every 5 (five) minutes as needed for chest pain.     OXYGEN  Inhale into the lungs at bedtime.     senna-docusate 8.6-50 MG tablet  Commonly known as:  Senokot-S  Take 2 tablets by mouth 2 (two) times daily.     tamsulosin 0.4 MG Caps capsule  Commonly known as:  FLOMAX  Take 0.4 mg by mouth at bedtime.     venlafaxine 75 MG tablet  Commonly known as:  EFFEXOR  Take 1 tablet (75 mg total) by mouth daily with supper.     warfarin 7.5 MG tablet  Commonly known as:  COUMADIN  Take 1 tablet (7.5 mg total) by mouth daily.           Follow-up Information    Follow up with [facility].   Specialty:  Skilled Nursing Facility   Contact information:   [address] [place] [id number] [phone]      Follow up with Garlan FillersPATERSON,DANIEL G, MD. Schedule an appointment as soon as possible for a visit in 2 weeks.   Specialty:  Internal Medicine   Why:  Follow up appt after recent hospitalization   Contact information:   [address] [place]  [id number] [phone]        The results of significant diagnostics from this hospitalization (including imaging, microbiology, ancillary and laboratory) are listed below for reference.    Significant Diagnostic Studies: Dg Lumbar Spine 2-3 Views  10/28/2015  CLINICAL DATA:  Low back pain after fall on Saturday. EXAM: LUMBAR SPINE - 2-3 VIEW COMPARISON:  MRI 10/18/2008 FINDINGS: Normal alignment of the lumbar spine. Multilevel degenerative endplate changes. The vertebral body heights are maintained without a fracture. There is disc space narrowing at L1-L2. Atherosclerotic calcifications in the abdominal aorta. Again noted is mild retrolisthesis at L2-L3. IMPRESSION: No acute abnormality in lumbar spine. Electronically Signed   By: Richarda OverlieAdam  Henn M.D.   On: 10/28/2015 18:31   Ct Head Wo Contrast  10/31/2015  CLINICAL DATA:  Headache, altered mental status EXAM: CT HEAD WITHOUT CONTRAST TECHNIQUE: Contiguous axial images were obtained from the base of the skull through the vertex without intravenous contrast. COMPARISON:  10/28/2015 FINDINGS: No evidence of parenchymal hemorrhage or extra-axial fluid collection. No mass lesion, mass effect, or midline shift. No CT evidence of acute infarction. Subcortical white matter and periventricular small vessel ischemic changes. Intracranial atherosclerosis. Global cortical and central atrophy.  Secondary ventriculomegaly. The visualized paranasal sinuses are essentially clear. The mastoid air cells are unopacified. No evidence of calvarial fracture. IMPRESSION: No evidence of acute intracranial abnormality. Small vessel ischemic changes. Atrophy with secondary ventriculomegaly. Electronically Signed   By: Charline BillsSriyesh  Krishnan M.D.   On: 10/31/2015 13:42   Ct Head Wo Contrast  10/28/2015  CLINICAL DATA:  Increase in weakness over the last 6 days. EXAM: CT HEAD WITHOUT CONTRAST TECHNIQUE: Contiguous axial images were obtained from the base of the skull through the vertex  without intravenous contrast. COMPARISON:  08/05/2015 FINDINGS: Chronic area of low attenuation in the periventricular white matter of the left parietal lobe is identified compatible with previous infarct. Mild diffuse low attenuation throughout the subcortical white matter is also noted compatible with chronic microvascular disease. No evidence for acute brain infarct, intracranial hemorrhage or mass. Prominence of the sulci and ventricles compatible with brain atrophy. The paranasal sinuses and mastoid air cells are clear. The calvarium is intact. IMPRESSION: 1. No acute intracranial abnormalities. 2. Chronic microvascular disease and brain atrophy. Electronically Signed   By: Signa Kell M.D.   On: 10/28/2015 15:41    Microbiology: Recent Results (from the past 240 hour(s))  Culture, Urine     Status: Abnormal   Collection Time: 10/28/15  4:12 PM  Result Value Ref Range Status   Specimen Description URINE, RANDOM  Final   Special Requests NONE  Final   Culture 7,000 COLONIES/mL INSIGNIFICANT GROWTH (A)  Final   Report Status 10/30/2015 FINAL  Final     Labs: Basic Metabolic Panel:  Recent Labs Lab 10/28/15 1311 10/29/15 0517 10/30/15 0813  NA 140 140 142  K 3.9 3.3* 3.5  CL 103 101 103  CO2 GLUCOSE 157* 105* 144*  BUN CREATININE 0.79 0.63 0.75  CALCIUM 8.9 8.6* 9.1  MG  --  1.9  --   PHOS  --  3.6  --    Liver Function Tests:  Recent Labs Lab 10/28/15 1311 10/29/15 0517  AST 25 29  ALT 27 36  ALKPHOS 74 69  BILITOT 0.8 1.2  PROT 7.2 6.3*  ALBUMIN 3.4* 3.1*   No results for input(s): LIPASE, AMYLASE in the last 168 hours.  Recent Labs Lab 10/29/15 0857  AMMONIA 17   CBC:  Recent Labs Lab 10/29/15 0517 10/30/15 0533 10/31/15 0505 11/01/15 0533 11/02/15 0626  WBC 7.6 8.0 7.1 6.9 7.5  HGB 14.0 14.8 13.8 13.2 13.4  HCT 43.2 44.9 41.9 41.2 41.6  MCV 86.7 86.8 86.4 84.8 86.5  PLT 294 306 313 297 292   Cardiac Enzymes: No results  for input(s): CKTOTAL, CKMB, CKMBINDEX, TROPONINI in the last 168 hours. BNP: BNP (last 3 results) No results for input(s): BNP in the last 8760 hours.  ProBNP (last 3 results) No results for input(s): PROBNP in the last 8760 hours.  CBG:  Recent Labs Lab 11/01/15 0745 11/01/15 1123 11/01/15 1610 11/01/15 2109 11/02/15 0743  GLUCAP 124* 168* 155* 209* 138*

## 2015-11-02 NOTE — Progress Notes (Signed)
Called report to [facility]. Patient d/c'ed with daughter to transport to facility.

## 2015-11-11 ENCOUNTER — Ambulatory Visit: Payer: Commercial Managed Care - HMO | Admitting: Nurse Practitioner

## 2015-11-15 ENCOUNTER — Ambulatory Visit: Payer: Commercial Managed Care - HMO | Admitting: Nurse Practitioner

## 2015-12-02 ENCOUNTER — Encounter: Payer: Medicare Other | Admitting: Internal Medicine

## 2015-12-29 ENCOUNTER — Emergency Department (HOSPITAL_COMMUNITY)
Admission: EM | Admit: 2015-12-29 | Discharge: 2015-12-29 | Disposition: A | Discharge status: Home / Self Care | Payer: Medicare Other | Attending: Emergency Medicine | Admitting: Emergency Medicine

## 2015-12-29 ENCOUNTER — Emergency Department (HOSPITAL_COMMUNITY): Payer: Medicare Other

## 2015-12-29 ENCOUNTER — Encounter (HOSPITAL_COMMUNITY): Payer: Self-pay | Admitting: Emergency Medicine

## 2015-12-29 DIAGNOSIS — I2510 Atherosclerotic heart disease of native coronary artery without angina pectoris: Secondary | ICD-10-CM | POA: Insufficient documentation

## 2015-12-29 DIAGNOSIS — Z794 Long term (current) use of insulin: Secondary | ICD-10-CM | POA: Diagnosis not present

## 2015-12-29 DIAGNOSIS — Z87891 Personal history of nicotine dependence: Secondary | ICD-10-CM | POA: Insufficient documentation

## 2015-12-29 DIAGNOSIS — Z950 Presence of cardiac pacemaker: Secondary | ICD-10-CM | POA: Diagnosis not present

## 2015-12-29 DIAGNOSIS — Y999 Unspecified external cause status: Secondary | ICD-10-CM | POA: Diagnosis not present

## 2015-12-29 DIAGNOSIS — Y9389 Activity, other specified: Secondary | ICD-10-CM | POA: Insufficient documentation

## 2015-12-29 DIAGNOSIS — W19XXXA Unspecified fall, initial encounter: Secondary | ICD-10-CM

## 2015-12-29 DIAGNOSIS — S2242XA Multiple fractures of ribs, left side, initial encounter for closed fracture: Secondary | ICD-10-CM | POA: Insufficient documentation

## 2015-12-29 DIAGNOSIS — S2232XA Fracture of one rib, left side, initial encounter for closed fracture: Secondary | ICD-10-CM

## 2015-12-29 DIAGNOSIS — Y929 Unspecified place or not applicable: Secondary | ICD-10-CM | POA: Diagnosis not present

## 2015-12-29 DIAGNOSIS — S299XXA Unspecified injury of thorax, initial encounter: Secondary | ICD-10-CM | POA: Diagnosis present

## 2015-12-29 DIAGNOSIS — Z7901 Long term (current) use of anticoagulants: Secondary | ICD-10-CM | POA: Insufficient documentation

## 2015-12-29 DIAGNOSIS — Z7982 Long term (current) use of aspirin: Secondary | ICD-10-CM | POA: Diagnosis not present

## 2015-12-29 DIAGNOSIS — Z951 Presence of aortocoronary bypass graft: Secondary | ICD-10-CM | POA: Diagnosis not present

## 2015-12-29 DIAGNOSIS — I10 Essential (primary) hypertension: Secondary | ICD-10-CM | POA: Diagnosis not present

## 2015-12-29 DIAGNOSIS — E119 Type 2 diabetes mellitus without complications: Secondary | ICD-10-CM | POA: Insufficient documentation

## 2015-12-29 DIAGNOSIS — R109 Unspecified abdominal pain: Secondary | ICD-10-CM | POA: Insufficient documentation

## 2015-12-29 DIAGNOSIS — W1830XA Fall on same level, unspecified, initial encounter: Secondary | ICD-10-CM | POA: Diagnosis not present

## 2015-12-29 LAB — BASIC METABOLIC PANEL
Anion gap: 9 (ref 5–15)
BUN: 11 mg/dL (ref 6–20)
CO2: 23 mmol/L (ref 22–32)
CREATININE: 0.69 mg/dL (ref 0.61–1.24)
Calcium: 8.6 mg/dL — ABNORMAL LOW (ref 8.9–10.3)
Chloride: 105 mmol/L (ref 101–111)
GFR calc non Af Amer: >60 mL/min (ref >60)
Glucose, Bld: 175 mg/dL — ABNORMAL HIGH (ref 65–99)
POTASSIUM: 3.7 mmol/L (ref 3.5–5.1)
SODIUM: 137 mmol/L (ref 135–145)

## 2015-12-29 LAB — CBC
HEMATOCRIT: 41.3 % (ref 39.0–52.0)
Hemoglobin: 13.4 g/dL (ref 13.0–17.0)
MCH: 27.8 pg (ref 26.0–34.0)
MCHC: 32.4 g/dL (ref 30.0–36.0)
MCV: 85.7 fL (ref 78.0–100.0)
PLATELETS: 274 10*3/uL (ref 150–400)
RBC: 4.82 MIL/uL (ref 4.22–5.81)
RDW: 14.2 % (ref 11.5–15.5)
WBC: 8.4 10*3/uL (ref 4.0–10.5)

## 2015-12-29 LAB — I-STAT TROPONIN, ED: Troponin i, poc: 0.04 ng/mL (ref 0.00–0.08)

## 2015-12-29 LAB — PROTIME-INR
INR: 2.34 — AB (ref 0.00–1.49)
PROTHROMBIN TIME: 25.4 s — AB (ref 11.6–15.2)

## 2015-12-29 MED ORDER — IOPAMIDOL (ISOVUE-300) INJECTION 61%
INTRAVENOUS | Status: AC
  Administered 2015-12-29: 100 mL
  Filled 2015-12-29: qty 100

## 2015-12-29 NOTE — ED Notes (Signed)
Patient transported to X-ray 

## 2015-12-29 NOTE — ED Notes (Signed)
Incentive spirometry given to pt and pt demonstrates  proper use of device.

## 2015-12-29 NOTE — ED Notes (Addendum)
[facility].  EMS reports pt given 1 NTG with no relief and 324 ASA.  Pt reports falling on L side 1 week ago.  Pain noted with movement.  Bruising noted to L laterdal ribs, L shoulder.

## 2015-12-29 NOTE — Discharge Instructions (Signed)
It is very important to use your incentive spirometer multiple times about the day. We want you to continue to take deep breaths so that you do not develop pneumonia. We also want you to get up and move around a couple times each day as well, but please make sure the staff is assisting you with this so that you do not fall. Maintain oxygen saturation > 94%. Tylenol as needed for pain.  See your primary care physician in 5-7 days to ensure everything is healing well.  If you have any difficulty breathing, fever, new or worsening symptoms, or any additional concerns, please do not hesitate to return to the Emergency Department for further evaluation.

## 2015-12-29 NOTE — ED Provider Notes (Signed)
CSN: [id number]     Arrival date & time 12/29/15  0755 History   First MD Initiated Contact with Patient 12/29/15 [phone]     Chief Complaint  Patient presents with  . Chest Pain  . Fall    (Consider location/radiation/quality/duration/timing/severity/associated sxs/prior Treatment) The history is provided by the patient, the spouse and medical records. No language interpreter was used.    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 Pain worse with movement and big breaths. Patient states he is pain-free as long as he stays completely still. Tylenol taken with some relief. Denies difficulty breathing, muscle weakness, slurred speech, diaphoresis, n/v, or any other associated complaints. Denies head injury or LOC. He was given 1 NTG with no relief in symptoms by EMS along with 324 ASA. Patient is also on coumadin.    Past Medical History  Diagnosis Date  . Hypertension   . Hyperlipidemia   . Type II or unspecified type diabetes mellitus without mention of complication, not stated as uncontrolled   . Nephrolithiasis   . Atrial flutter [facility])     s/p CTI ablation by Dr Ladona Ridgel 1/12  . CAD (coronary artery disease)     s/p CABG x 5 2003 by Dr Laneta Simmers  . Aortic stenosis, moderate     moderate to severe per echo 03/2012 with AVR in March of 2014  . DJD (degenerative joint disease)   . DDD (degenerative disc disease)   . Spinal stenosis of lumbar region   . Bifascicular block   . Atrial tachycardia (HCC)   . Anxiety   . Depression   . S/P AVR (aortic valve replacement)   . OSA (obstructive sleep apnea)   . 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 Diverticulosis   . Hemorrhoids    . Complete heart block (HCC) 08/21/15    STJ PPM, Dr. Johney Frame   Past Surgical History  Procedure Laterality Date  . Cabg  2003    by Dr Laneta Simmers  . Anal fissure repair    . Leg surgery      right  . Trigger finger release      bilat.  . Tonsillectomy    . Back surgery  2010  . Atrial ablation surgery  1/12    atrial flutter ablation by Dr Ladona Ridgel  . Cardioversion  04/07/2012    Procedure: CARDIOVERSION;  Surgeon: Wendall Stade, MD;  Location: [facility] ENDOSCOPY;  Service: Cardiovascular;  Laterality: N/A;  . Coronary artery bypass graft      10 yrs ago   03  . Aortic valve replacement N/A 09/01/2012    Procedure: AORTIC VALVE REPLACEMENT (AVR);  Surgeon: Alleen Borne, MD;  Location: [facility] OR;  Service: Open Heart Surgery;  Laterality: N/A;  . Intraoperative transesophageal echocardiogram N/A 09/01/2012    Procedure: INTRAOPERATIVE TRANSESOPHAGEAL ECHOCARDIOGRAM;  Surgeon: Alleen Borne, MD;  Location: [facility] OR;  Service: Open Heart Surgery;  Laterality: N/A;  . Ep implantable device N/A 08/21/2015    STJ PPM, Dr. Johney Frame   Family History  Problem Relation Age of Onset  . Heart disease Father   . Heart disease Paternal Grandfather   . Stroke Mother   . Rheum arthritis Mother   .  Colon cancer Neg Hx   . Stomach cancer Neg Hx    Social History  Substance Use Topics  . Smoking status: Former Smoker -- 1.00 packs/day for 49 years    Quit date: 08/21/2001  . Smokeless tobacco: Never Used     Comment: started at age 81.   [name]. Alcohol Use: No     Comment: quit 15 yrs ago    Review of Systems  Constitutional: Negative for fever and chills.  HENT: Negative for congestion.   Eyes: Negative for visual disturbance.  Respiratory: Negative for cough and shortness of breath.   Cardiovascular: Positive for chest pain (left chest wall).  Gastrointestinal: Positive for abdominal pain. Negative for nausea and vomiting.  Genitourinary: Negative for dysuria.  Musculoskeletal: Positive for myalgias and  arthralgias. Negative for neck pain.  Skin: Positive for wound (Bruising).  Neurological: Negative for dizziness, syncope and headaches.      Allergies  Doxycycline  Home Medications   Prior to Admission medications   Medication Sig Start Date End Date Taking? Authorizing Provider  albuterol (PROVENTIL HFA;VENTOLIN HFA) 108 (90 BASE) MCG/ACT inhaler Inhale 2 puffs into the lungs every 6 (six) hours as needed for wheezing. 08/09/13  Yes Barbaraann ShareKeith M Clance, MD  aspirin 81 MG tablet Take 81 mg by mouth daily.   Yes Historical Provider, MD  budesonide-formoterol (SYMBICORT) 160-4.5 MCG/ACT inhaler Inhale 2 puffs into the lungs 2 (two) times daily. 03/08/14  Yes Barbaraann ShareKeith M Clance, MD  cilostazol (PLETAL) 50 MG tablet Take 50 mg by mouth 2 (two) times daily.   Yes Historical Provider, MD  enoxaparin (LOVENOX) 80 MG/0.8ML injection Inject 0.7 mLs (70 mg total) into the skin every 12 (twelve) hours. 11/02/15  Yes Alison MurrayAlma M Devine, MD  insulin glargine (LANTUS) 100 unit/mL SOPN Inject 20 Units into the skin at bedtime.   Yes Historical Provider, MD  insulin lispro (HUMALOG KWIKPEN) 100 UNIT/ML KiwkPen Inject 0-10 Units into the skin See admin instructions. Per sliding scale   Yes Historical Provider, MD  losartan (COZAAR) 100 MG tablet Take 1 tablet (100 mg total) by mouth daily. 08/14/15  Yes Hillis RangeJames Allred, MD  Melatonin 3 MG CAPS Take 3 mg by mouth at bedtime. Reported on 09/04/2015   Yes Historical Provider, MD  memantine (NAMENDA) 10 MG tablet Take 1 tablet (10 mg total) by mouth 2 (two) times daily. 04/11/15  Yes Porfirio Mylararmen Dohmeier, MD  metoprolol tartrate (LOPRESSOR) 25 MG tablet Take 1 tablet (25 mg total) by mouth 2 (two) times daily. 09/05/15  Yes Hillis RangeJames Allred, MD  Multiple Vitamins-Minerals (MULTIVITAMIN WITH MINERALS) tablet Take 1 tablet by mouth daily.   Yes Historical Provider, MD  nitroGLYCERIN (NITROSTAT) 0.4 MG SL tablet Place 0.4 mg under the tongue every 5 (five) minutes as needed for chest pain.    Yes Historical Provider, MD  OXYGEN Inhale into the lungs at bedtime.   Yes Historical Provider, MD  senna-docusate (SENOKOT-S) 8.6-50 MG tablet Take 2 tablets by mouth 2 (two) times daily. 11/02/15  Yes Alison MurrayAlma M Devine, MD  Tamsulosin HCl (FLOMAX) 0.4 MG CAPS Take 0.4 mg by mouth at bedtime.  03/23/11  Yes Historical Provider, MD  venlafaxine (EFFEXOR) 75 MG tablet Take 1 tablet (75 mg total) by mouth daily with supper. 11/02/15  Yes Alison MurrayAlma M Devine, MD  warfarin (COUMADIN) 4 MG tablet Take 8 mg by mouth daily.   Yes Historical Provider, MD  aspirin 325 MG tablet Take 1 tablet (325 mg total) by mouth at bedtime.  Patient not taking: Reported on 12/29/2015 11/02/15   Alison Murray, MD  insulin aspart (NOVOLOG) 100 UNIT/ML injection Inject 0-9 Units into the skin 3 (three) times daily with meals. Patient not taking: Reported on 12/29/2015 11/02/15   Alison Murray, MD  warfarin (COUMADIN) 7.5 MG tablet Take 1 tablet (7.5 mg total) by mouth daily. Patient not taking: Reported on 12/29/2015 11/02/15   Alison Murray, MD   BP 135/57 mmHg  Pulse 64  Temp(Src) 98.6 F (37 C) (Oral)  Resp 17  Ht 5\' 6"  (1.676 m)  Wt 72.576 kg  BMI 25.84 kg/m2  SpO2 99% Physical Exam  Constitutional: He is oriented to person, place, and time. He appears well-developed and well-nourished. No distress.  HENT:  Head: Normocephalic and atraumatic.  Eyes: Pupils are equal, round, and reactive to light.  Neck: Normal range of motion. Neck supple.  No midline tenderness.  Cardiovascular: Normal rate, regular rhythm and intact distal pulses.   Pulmonary/Chest: Effort normal and breath sounds normal. No respiratory distress. He has no wheezes. He has no rales. He exhibits tenderness.  TTP over left lateral rib cage with mild associated bruising.   Abdominal: Soft. Bowel sounds are normal. He exhibits no distension and no mass. There is no rebound and no guarding.  TTP of left abdomen.   Musculoskeletal: He exhibits no edema.   Neurological: He is alert and oriented to person, place, and time. No cranial nerve deficit.  Nursing note and vitals reviewed.   ED Course  Procedures (including critical care time) Labs Review Labs Reviewed  BASIC METABOLIC PANEL - Abnormal; Notable for the following:    Glucose, Bld 175 (*)    Calcium 8.6 (*)    All other components within normal limits  PROTIME-INR - Abnormal; Notable for the following:    Prothrombin Time 25.4 (*)    INR 2.34 (*)    All other components within normal limits  CBC  I-STAT TROPOININ, ED    Imaging Review Dg Chest 2 View  12/29/2015  CLINICAL DATA:  Chest pain and fall on left side with cough EXAM: CHEST  2 VIEW COMPARISON:  08/22/2015 chest radiograph. FINDINGS: Stable configuration of 2 lead left subclavian pacemaker. Sternotomy wires appear aligned and intact. Cardiac valvular prosthesis is in place. CABG clips overlie the mediastinum. Stable cardiomediastinal silhouette with mild cardiomegaly. No pneumothorax. No pleural effusion. No pulmonary edema. Stable reticular opacities at both lung bases. No acute consolidative airspace disease. IMPRESSION: 1. Stable mild cardiomegaly without pulmonary edema. 2. Stable reticular opacities at both lung bases, suggesting interstitial lung disease. No acute pulmonary disease. Electronically Signed   By: Delbert Phenix M.D.   On: 12/29/2015 09:11   Dg Ribs Unilateral Left  12/29/2015  CLINICAL DATA:  Fall on left side 1 week prior with left chest pain EXAM: LEFT RIBS - 2 VIEW COMPARISON:  08/22/2015 chest radiograph. FINDINGS: The area of symptomatic concern as indicated by the patient in the lower left chest wall was denoted with a metallic skin BB by the technologist. There are nondisplaced acute left posterolateral sixth and seventh rib fractures. No additional left rib fractures or focal osseous lesions. No left pneumothorax. Reticular interstitial opacities at both lung bases. Mild cardiomegaly without pulmonary  edema. Sternotomy wires appear aligned and intact. Aortic valvular prosthesis is in place. CABG clips overlie the mediastinum. Stable configuration of 2 lead left subclavian pacemaker. IMPRESSION: 1. Nondisplaced left posterolateral sixth and seventh rib fractures, which appear acute. 2. No  left pneumothorax. 3. Reticular interstitial opacities at both lung bases, suggesting interstitial lung disease. 4. Stable mild cardiomegaly without pulmonary edema. Electronically Signed   By: Delbert Phenix M.D.   On: 12/29/2015 09:14   Ct Chest W Contrast  12/29/2015  CLINICAL DATA:  Chest pain for 1 week.  Fall 1 week ago. EXAM: CT CHEST, ABDOMEN, AND PELVIS WITH CONTRAST TECHNIQUE: Multidetector CT imaging of the chest, abdomen and pelvis was performed following the standard protocol during bolus administration of intravenous contrast. CONTRAST:  1 ISOVUE-300 IOPAMIDOL (ISOVUE-300) INJECTION 61% COMPARISON:  None. FINDINGS: CT CHEST FINDINGS Mediastinum/Nodes: No axillary or supraclavicular adenopathy. No mediastinal hilar adenopathy. No pericardial fluid. Coronary calcifications noted. CABG anatomy. Esophagus normal. Lungs/Pleura: Peripheral reticular pattern throughout the lungs. Subpleural cystic change in the lung bases. No focal airspace disease. No pleural fluid or pneumothorax. Airways normal. Musculoskeletal: No aggressive osseous lesion. CT ABDOMEN AND PELVIS FINDINGS Hepatobiliary: No focal hepatic lesion. No biliary ductal dilatation. Gallbladder is normal. Common bile duct is normal. Pancreas: Pancreas is normal. No ductal dilatation. No pancreatic inflammation. Spleen: Normal spleen Adrenals/urinary tract: Normal adrenal glands. Simple fluid attenuation cortical lesion in the LEFT kidney measures 2 cm. No obstructive uropathy. Normal bladder with prostate enlargement Stomach/Bowel: Stomach, small bowel, appendix, and cecum are normal. The colon and rectosigmoid colon are normal. Vascular/Lymphatic: Abdominal  aorta is normal caliber with atherosclerotic calcification. There is no retroperitoneal or periportal lymphadenopathy. No pelvic lymphadenopathy. Reproductive: Nodular prostate enlargement indents base the bladder. Other: Small inguinal hernias. Musculoskeletal: Nondisplaced LEFT seventh rib fracture, posterior lateral (image 28, series 2). More subtle fractures of the fifth and sixth lateral left ribs (image 25) No aggressive osseous lesion. Compression deformity at T12 involving the inferior endplate. IMPRESSION: Chest Impression: 1. Nondisplaced fracture the LEFT fifth through seventh ribs. 2. No pneumothorax. 3. Chronic interstitial lung disease. Abdomen / Pelvis Impression: 1. No acute findings in the abdomen pelvis. 2.  Atherosclerotic calcification of the abdominal aorta. 3. Prostate hypertrophy. 4. Benign-appearing lesion LEFT kidney. Electronically Signed   By: Genevive Bi M.D.   On: 12/29/2015 10:25   Ct Abdomen Pelvis W Contrast  12/29/2015  CLINICAL DATA:  Chest pain for 1 week.  Fall 1 week ago. EXAM: CT CHEST, ABDOMEN, AND PELVIS WITH CONTRAST TECHNIQUE: Multidetector CT imaging of the chest, abdomen and pelvis was performed following the standard protocol during bolus administration of intravenous contrast. CONTRAST:  1 ISOVUE-300 IOPAMIDOL (ISOVUE-300) INJECTION 61% COMPARISON:  None. FINDINGS: CT CHEST FINDINGS Mediastinum/Nodes: No axillary or supraclavicular adenopathy. No mediastinal hilar adenopathy. No pericardial fluid. Coronary calcifications noted. CABG anatomy. Esophagus normal. Lungs/Pleura: Peripheral reticular pattern throughout the lungs. Subpleural cystic change in the lung bases. No focal airspace disease. No pleural fluid or pneumothorax. Airways normal. Musculoskeletal: No aggressive osseous lesion. CT ABDOMEN AND PELVIS FINDINGS Hepatobiliary: No focal hepatic lesion. No biliary ductal dilatation. Gallbladder is normal. Common bile duct is normal. Pancreas: Pancreas is  normal. No ductal dilatation. No pancreatic inflammation. Spleen: Normal spleen Adrenals/urinary tract: Normal adrenal glands. Simple fluid attenuation cortical lesion in the LEFT kidney measures 2 cm. No obstructive uropathy. Normal bladder with prostate enlargement Stomach/Bowel: Stomach, small bowel, appendix, and cecum are normal. The colon and rectosigmoid colon are normal. Vascular/Lymphatic: Abdominal aorta is normal caliber with atherosclerotic calcification. There is no retroperitoneal or periportal lymphadenopathy. No pelvic lymphadenopathy. Reproductive: Nodular prostate enlargement indents base the bladder. Other: Small inguinal hernias. Musculoskeletal: Nondisplaced LEFT seventh rib fracture, posterior lateral (image 28, series 2). More subtle fractures  of the fifth and sixth lateral left ribs (image 25) No aggressive osseous lesion. Compression deformity at T12 involving the inferior endplate. IMPRESSION: Chest Impression: 1. Nondisplaced fracture the LEFT fifth through seventh ribs. 2. No pneumothorax. 3. Chronic interstitial lung disease. Abdomen / Pelvis Impression: 1. No acute findings in the abdomen pelvis. 2.  Atherosclerotic calcification of the abdominal aorta. 3. Prostate hypertrophy. 4. Benign-appearing lesion LEFT kidney. Electronically Signed   By: Stewart  Edmunds M.D.   On: 12/29/2015 10:25   Genevive Bially reviewed and evaluated these images and lab results as part of my medical decision-making.   EKG Interpretation None      MDM   Final diagnoses:  Fall  Rib fractures, left, closed, initial encounter   Adam Passe, [name] presents to ED for left chest wall pain and left-sided abdominal pain which began acutely after a mechanical fall yesterday. On exam, ttp of lateral left rib cage where he fell. Patient also exhibits ttp of left abdomen. Given abdominal tenderness and patient on anti-coagulation will scan abdomen. CXR shows nondisplaced 6th & 7th ribs. CT  chest/abd/pelvis ordered. Offered patient pain medication - he states he is fine at this time and does not want to take any medications. All labs reviewed.   CT with 3 rib fractures. No other acute findings.   12:36 PM - Patient reevaluated. Abdominal exam unchanged. Maintaining O2 sats well. Discussed plan to return to SNF. Incentive spirometer provided and patient encouraged to use multiple times throughout the day. Also informed patient, family and written on discharge instructions for facility of return precautions as well as the importance of moving around with guided assistance.  Patient seen by and discussed with Dr. Rosalia Hammers who agrees with treatment plan.   [facility] Zackaria Burkey, PA-C 12/29/15 1246  Margarita Grizzle, MD 01/01/16 [phone]

## 2016-01-11 ENCOUNTER — Observation Stay (HOSPITAL_COMMUNITY)
Admission: EM | Admit: 2016-01-11 | Discharge: 2016-01-14 | Disposition: A | Discharge status: Home / Self Care | Payer: Medicare Other | Attending: Internal Medicine | Admitting: Internal Medicine

## 2016-01-11 DIAGNOSIS — Z87891 Personal history of nicotine dependence: Secondary | ICD-10-CM | POA: Insufficient documentation

## 2016-01-11 DIAGNOSIS — Z7982 Long term (current) use of aspirin: Secondary | ICD-10-CM | POA: Insufficient documentation

## 2016-01-11 DIAGNOSIS — I10 Essential (primary) hypertension: Secondary | ICD-10-CM | POA: Diagnosis not present

## 2016-01-11 DIAGNOSIS — R4182 Altered mental status, unspecified: Secondary | ICD-10-CM | POA: Diagnosis present

## 2016-01-11 DIAGNOSIS — R000 Tachycardia, unspecified: Principal | ICD-10-CM | POA: Insufficient documentation

## 2016-01-11 DIAGNOSIS — F0280 Dementia in other diseases classified elsewhere without behavioral disturbance: Secondary | ICD-10-CM | POA: Diagnosis present

## 2016-01-11 DIAGNOSIS — Z7901 Long term (current) use of anticoagulants: Secondary | ICD-10-CM | POA: Insufficient documentation

## 2016-01-11 DIAGNOSIS — Z794 Long term (current) use of insulin: Secondary | ICD-10-CM | POA: Diagnosis not present

## 2016-01-11 DIAGNOSIS — Z951 Presence of aortocoronary bypass graft: Secondary | ICD-10-CM | POA: Diagnosis not present

## 2016-01-11 DIAGNOSIS — Z79899 Other long term (current) drug therapy: Secondary | ICD-10-CM | POA: Insufficient documentation

## 2016-01-11 DIAGNOSIS — I480 Paroxysmal atrial fibrillation: Secondary | ICD-10-CM | POA: Diagnosis present

## 2016-01-11 DIAGNOSIS — Z952 Presence of prosthetic heart valve: Secondary | ICD-10-CM

## 2016-01-11 DIAGNOSIS — E119 Type 2 diabetes mellitus without complications: Secondary | ICD-10-CM | POA: Diagnosis not present

## 2016-01-11 DIAGNOSIS — I2510 Atherosclerotic heart disease of native coronary artery without angina pectoris: Secondary | ICD-10-CM | POA: Insufficient documentation

## 2016-01-11 DIAGNOSIS — I442 Atrioventricular block, complete: Secondary | ICD-10-CM | POA: Diagnosis present

## 2016-01-11 DIAGNOSIS — R410 Disorientation, unspecified: Secondary | ICD-10-CM | POA: Insufficient documentation

## 2016-01-11 DIAGNOSIS — G931 Anoxic brain damage, not elsewhere classified: Secondary | ICD-10-CM

## 2016-01-11 DIAGNOSIS — J449 Chronic obstructive pulmonary disease, unspecified: Secondary | ICD-10-CM | POA: Diagnosis present

## 2016-01-11 DIAGNOSIS — I4892 Unspecified atrial flutter: Secondary | ICD-10-CM | POA: Diagnosis present

## 2016-01-11 DIAGNOSIS — R1030 Lower abdominal pain, unspecified: Secondary | ICD-10-CM | POA: Insufficient documentation

## 2016-01-11 DIAGNOSIS — E0590 Thyrotoxicosis, unspecified without thyrotoxic crisis or storm: Secondary | ICD-10-CM

## 2016-01-12 ENCOUNTER — Emergency Department (HOSPITAL_COMMUNITY): Payer: Medicare Other

## 2016-01-12 ENCOUNTER — Encounter (HOSPITAL_COMMUNITY): Payer: Self-pay | Admitting: Emergency Medicine

## 2016-01-12 DIAGNOSIS — E119 Type 2 diabetes mellitus without complications: Secondary | ICD-10-CM | POA: Diagnosis not present

## 2016-01-12 DIAGNOSIS — X58XXXD Exposure to other specified factors, subsequent encounter: Secondary | ICD-10-CM

## 2016-01-12 DIAGNOSIS — Z952 Presence of prosthetic heart valve: Secondary | ICD-10-CM

## 2016-01-12 DIAGNOSIS — Z950 Presence of cardiac pacemaker: Secondary | ICD-10-CM

## 2016-01-12 DIAGNOSIS — R410 Disorientation, unspecified: Secondary | ICD-10-CM

## 2016-01-12 DIAGNOSIS — E0590 Thyrotoxicosis, unspecified without thyrotoxic crisis or storm: Secondary | ICD-10-CM

## 2016-01-12 DIAGNOSIS — F0150 Vascular dementia without behavioral disturbance: Secondary | ICD-10-CM | POA: Diagnosis not present

## 2016-01-12 DIAGNOSIS — I2510 Atherosclerotic heart disease of native coronary artery without angina pectoris: Secondary | ICD-10-CM

## 2016-01-12 DIAGNOSIS — G4733 Obstructive sleep apnea (adult) (pediatric): Secondary | ICD-10-CM

## 2016-01-12 DIAGNOSIS — I10 Essential (primary) hypertension: Secondary | ICD-10-CM | POA: Diagnosis not present

## 2016-01-12 DIAGNOSIS — N400 Enlarged prostate without lower urinary tract symptoms: Secondary | ICD-10-CM

## 2016-01-12 DIAGNOSIS — Z9989 Dependence on other enabling machines and devices: Secondary | ICD-10-CM

## 2016-01-12 DIAGNOSIS — R000 Tachycardia, unspecified: Secondary | ICD-10-CM | POA: Diagnosis not present

## 2016-01-12 DIAGNOSIS — S2242XD Multiple fractures of ribs, left side, subsequent encounter for fracture with routine healing: Secondary | ICD-10-CM

## 2016-01-12 DIAGNOSIS — J449 Chronic obstructive pulmonary disease, unspecified: Secondary | ICD-10-CM

## 2016-01-12 DIAGNOSIS — R296 Repeated falls: Secondary | ICD-10-CM

## 2016-01-12 DIAGNOSIS — Z794 Long term (current) use of insulin: Secondary | ICD-10-CM

## 2016-01-12 DIAGNOSIS — Z7982 Long term (current) use of aspirin: Secondary | ICD-10-CM

## 2016-01-12 LAB — COMPREHENSIVE METABOLIC PANEL
ALBUMIN: 3.6 g/dL (ref 3.5–5.0)
ALK PHOS: 141 U/L — AB (ref 38–126)
ALT: 17 U/L (ref 17–63)
ANION GAP: 8 (ref 5–15)
AST: 35 U/L (ref 15–41)
BUN: 9 mg/dL (ref 6–20)
CO2: 25 mmol/L (ref 22–32)
Calcium: 9.1 mg/dL (ref 8.9–10.3)
Chloride: 106 mmol/L (ref 101–111)
Creatinine, Ser: 0.77 mg/dL (ref 0.61–1.24)
GFR calc Af Amer: >60 mL/min (ref >60)
GFR calc non Af Amer: >60 mL/min (ref >60)
GLUCOSE: 116 mg/dL — AB (ref 65–99)
POTASSIUM: 3.3 mmol/L — AB (ref 3.5–5.1)
SODIUM: 139 mmol/L (ref 135–145)
Total Bilirubin: 0.8 mg/dL (ref 0.3–1.2)
Total Protein: 7.5 g/dL (ref 6.5–8.1)

## 2016-01-12 LAB — AMMONIA: Ammonia: 28 umol/L (ref 9–35)

## 2016-01-12 LAB — I-STAT CHEM 8, ED
BUN: 9 mg/dL (ref 6–20)
CHLORIDE: 103 mmol/L (ref 101–111)
CREATININE: 0.7 mg/dL (ref 0.61–1.24)
Calcium, Ion: 1.07 mmol/L — ABNORMAL LOW (ref 1.12–1.23)
GLUCOSE: 113 mg/dL — AB (ref 65–99)
HCT: 43 % (ref 39.0–52.0)
Hemoglobin: 14.6 g/dL (ref 13.0–17.0)
POTASSIUM: 3.4 mmol/L — AB (ref 3.5–5.1)
Sodium: 144 mmol/L (ref 135–145)
TCO2: 25 mmol/L (ref 0–100)

## 2016-01-12 LAB — I-STAT TROPONIN, ED
TROPONIN I, POC: 0.06 ng/mL (ref 0.00–0.08)
Troponin i, poc: 0.03 ng/mL (ref 0.00–0.08)

## 2016-01-12 LAB — DIFFERENTIAL
BASOS ABS: 0.1 10*3/uL (ref 0.0–0.1)
BASOS PCT: 1 %
Eosinophils Absolute: 0.3 10*3/uL (ref 0.0–0.7)
Eosinophils Relative: 3 %
LYMPHS ABS: 2.5 10*3/uL (ref 0.7–4.0)
Lymphocytes Relative: 26 %
MONOS PCT: 7 %
Monocytes Absolute: 0.7 10*3/uL (ref 0.1–1.0)
NEUTROS ABS: 6.1 10*3/uL (ref 1.7–7.7)
Neutrophils Relative %: 63 %

## 2016-01-12 LAB — CBC
HEMATOCRIT: 45.1 % (ref 39.0–52.0)
HEMOGLOBIN: 14.8 g/dL (ref 13.0–17.0)
MCH: 28.2 pg (ref 26.0–34.0)
MCHC: 32.8 g/dL (ref 30.0–36.0)
MCV: 85.9 fL (ref 78.0–100.0)
Platelets: 410 10*3/uL — ABNORMAL HIGH (ref 150–400)
RBC: 5.25 MIL/uL (ref 4.22–5.81)
RDW: 14.5 % (ref 11.5–15.5)
WBC: 9.9 10*3/uL (ref 4.0–10.5)

## 2016-01-12 LAB — URINALYSIS, ROUTINE W REFLEX MICROSCOPIC
Bilirubin Urine: NEGATIVE
GLUCOSE, UA: NEGATIVE mg/dL
HGB URINE DIPSTICK: NEGATIVE
KETONES UR: NEGATIVE mg/dL
Leukocytes, UA: NEGATIVE
Nitrite: NEGATIVE
PROTEIN: NEGATIVE mg/dL
Specific Gravity, Urine: 1.015 (ref 1.005–1.030)
pH: 6 (ref 5.0–8.0)

## 2016-01-12 LAB — RAPID URINE DRUG SCREEN, HOSP PERFORMED
Amphetamines: NOT DETECTED
BARBITURATES: NOT DETECTED
Benzodiazepines: POSITIVE — AB
Cocaine: NOT DETECTED
Opiates: POSITIVE — AB
TETRAHYDROCANNABINOL: NOT DETECTED

## 2016-01-12 LAB — GLUCOSE, CAPILLARY
GLUCOSE-CAPILLARY: 85 mg/dL (ref 65–99)
GLUCOSE-CAPILLARY: 100 mg/dL — AB (ref 65–99)
GLUCOSE-CAPILLARY: 129 mg/dL — AB (ref 65–99)
Glucose-Capillary: 96 mg/dL (ref 65–99)

## 2016-01-12 LAB — MAGNESIUM: Magnesium: 1.9 mg/dL (ref 1.7–2.4)

## 2016-01-12 LAB — PROTIME-INR
INR: 3.29 — ABNORMAL HIGH (ref 0.00–1.49)
Prothrombin Time: 32.8 seconds — ABNORMAL HIGH (ref 11.6–15.2)

## 2016-01-12 LAB — T4, FREE: Free T4: 1.73 ng/dL — ABNORMAL HIGH (ref 0.61–1.12)

## 2016-01-12 LAB — MRSA PCR SCREENING: MRSA BY PCR: NEGATIVE

## 2016-01-12 LAB — TSH: TSH: 0.297 u[IU]/mL — AB (ref 0.350–4.500)

## 2016-01-12 MED ORDER — WARFARIN - PHARMACIST DOSING INPATIENT: Freq: Every day | Status: DC

## 2016-01-12 MED ORDER — METOPROLOL TARTRATE 25 MG PO TABS
25.0000 mg | ORAL_TABLET | Freq: Two times a day (BID) | ORAL | Status: DC
  Administered 2016-01-12 – 2016-01-14 (×4): 25 mg via ORAL
  Filled 2016-01-12 (×4): qty 1

## 2016-01-12 MED ORDER — SODIUM CHLORIDE 0.9% FLUSH
3.0000 mL | Freq: Two times a day (BID) | INTRAVENOUS | Status: DC
  Administered 2016-01-12 – 2016-01-14 (×4): 3 mL via INTRAVENOUS

## 2016-01-12 MED ORDER — LIDOCAINE HCL 2 % EX GEL
1.0000 "application " | Freq: Three times a day (TID) | CUTANEOUS | Status: DC
  Administered 2016-01-12 – 2016-01-14 (×5): 1 via TOPICAL
  Filled 2016-01-12 (×3): qty 5

## 2016-01-12 MED ORDER — TAMSULOSIN HCL 0.4 MG PO CAPS
0.4000 mg | ORAL_CAPSULE | Freq: Every day | ORAL | Status: DC
  Administered 2016-01-12 – 2016-01-13 (×2): 0.4 mg via ORAL
  Filled 2016-01-12 (×2): qty 1

## 2016-01-12 MED ORDER — MOMETASONE FURO-FORMOTEROL FUM 200-5 MCG/ACT IN AERO
2.0000 | INHALATION_SPRAY | Freq: Two times a day (BID) | RESPIRATORY_TRACT | Status: DC
  Administered 2016-01-12 – 2016-01-14 (×4): 2 via RESPIRATORY_TRACT
  Filled 2016-01-12: qty 8.8

## 2016-01-12 MED ORDER — SODIUM CHLORIDE 0.9 % IV SOLN
Freq: Once | INTRAVENOUS | Status: DC
Start: 2016-01-12 — End: 2016-01-12

## 2016-01-12 MED ORDER — SENNOSIDES-DOCUSATE SODIUM 8.6-50 MG PO TABS: 2.0000 | ORAL_TABLET | Freq: Two times a day (BID) | ORAL | Status: DC | PRN

## 2016-01-12 MED ORDER — HYDROMORPHONE HCL 1 MG/ML IJ SOLN: 1.0000 mg | INTRAMUSCULAR | Status: DC | PRN

## 2016-01-12 MED ORDER — IOPAMIDOL (ISOVUE-300) INJECTION 61%
100.0000 mL | Freq: Once | INTRAVENOUS | Status: AC | PRN
  Administered 2016-01-12: 100 mL via INTRAVENOUS

## 2016-01-12 MED ORDER — WARFARIN SODIUM 1 MG PO TABS
1.0000 mg | ORAL_TABLET | Freq: Once | ORAL | Status: AC
  Administered 2016-01-12: 1 mg via ORAL
  Filled 2016-01-12: qty 1

## 2016-01-12 MED ORDER — INSULIN ASPART 100 UNIT/ML ~~LOC~~ SOLN
0.0000 [IU] | Freq: Three times a day (TID) | SUBCUTANEOUS | Status: DC
  Administered 2016-01-14: 2 [IU] via SUBCUTANEOUS

## 2016-01-12 MED ORDER — CILOSTAZOL 100 MG PO TABS
50.0000 mg | ORAL_TABLET | Freq: Two times a day (BID) | ORAL | Status: DC
  Administered 2016-01-12 – 2016-01-14 (×5): 50 mg via ORAL
  Filled 2016-01-12 (×5): qty 1

## 2016-01-12 MED ORDER — POTASSIUM CHLORIDE IN NACL 40-0.9 MEQ/L-% IV SOLN
INTRAVENOUS | Status: AC
  Administered 2016-01-12: 100 mL/h via INTRAVENOUS
  Filled 2016-01-12 (×2): qty 1000

## 2016-01-12 MED ORDER — VENLAFAXINE HCL 75 MG PO TABS
75.0000 mg | ORAL_TABLET | Freq: Every day | ORAL | Status: DC
  Administered 2016-01-12 – 2016-01-13 (×2): 75 mg via ORAL
  Filled 2016-01-12 (×3): qty 1

## 2016-01-12 MED ORDER — HYDROMORPHONE HCL 1 MG/ML IJ SOLN
INTRAMUSCULAR | Status: AC
  Filled 2016-01-12: qty 1

## 2016-01-12 MED ORDER — ALBUTEROL SULFATE (2.5 MG/3ML) 0.083% IN NEBU: 2.5000 mg | INHALATION_SOLUTION | Freq: Four times a day (QID) | RESPIRATORY_TRACT | Status: DC | PRN

## 2016-01-12 MED ORDER — INSULIN ASPART 100 UNIT/ML ~~LOC~~ SOLN
0.0000 [IU] | Freq: Every day | SUBCUTANEOUS | Status: DC
  Administered 2016-01-13: 2 [IU] via SUBCUTANEOUS

## 2016-01-12 MED ORDER — MEMANTINE HCL 10 MG PO TABS
10.0000 mg | ORAL_TABLET | Freq: Two times a day (BID) | ORAL | Status: DC
  Administered 2016-01-12 – 2016-01-14 (×5): 10 mg via ORAL
  Filled 2016-01-12 (×6): qty 1

## 2016-01-12 MED ORDER — LOSARTAN POTASSIUM 50 MG PO TABS
100.0000 mg | ORAL_TABLET | Freq: Every day | ORAL | Status: DC
  Administered 2016-01-12 – 2016-01-14 (×3): 100 mg via ORAL
  Filled 2016-01-12 (×3): qty 2

## 2016-01-12 MED ORDER — ASPIRIN EC 81 MG PO TBEC
81.0000 mg | DELAYED_RELEASE_TABLET | Freq: Every day | ORAL | Status: DC
  Administered 2016-01-12 – 2016-01-14 (×3): 81 mg via ORAL
  Filled 2016-01-12 (×3): qty 1

## 2016-01-12 MED ORDER — SODIUM CHLORIDE 0.9 % IV SOLN: INTRAVENOUS | Status: DC

## 2016-01-12 MED ORDER — ACETAMINOPHEN 500 MG PO TABS
1000.0000 mg | ORAL_TABLET | Freq: Three times a day (TID) | ORAL | Status: DC
  Administered 2016-01-12 – 2016-01-14 (×5): 1000 mg via ORAL
  Filled 2016-01-12 (×6): qty 2

## 2016-01-12 MED ORDER — FENTANYL CITRATE (PF) 100 MCG/2ML IJ SOLN
50.0000 ug | Freq: Once | INTRAMUSCULAR | Status: DC
  Filled 2016-01-12: qty 2

## 2016-01-12 MED ORDER — INSULIN ASPART 100 UNIT/ML ~~LOC~~ SOLN: 0.0000 [IU] | SUBCUTANEOUS | Status: DC

## 2016-01-12 MED ORDER — INSULIN GLARGINE 100 UNIT/ML ~~LOC~~ SOLN
10.0000 [IU] | Freq: Every day | SUBCUTANEOUS | Status: DC
  Administered 2016-01-12 – 2016-01-13 (×2): 10 [IU] via SUBCUTANEOUS
  Filled 2016-01-12 (×3): qty 0.1

## 2016-01-12 NOTE — H&P (Signed)
Date: 01/12/2016               Patient Name:  Adam SAMONS, [name] MRN: [id number]  DOB: Oct 29, 1934 Age / Sex: 81 y.o., male   PCP: Jarome Matin, MD         Medical Service: Internal Medicine Teaching Service         Attending Physician: Dr. Doneen Poisson, MD    First Contact: Dr. Mikey Bussing Pager: [phone]  Second Contact: Dr. Beckie Salts Pager: [phone]       After Hours (After 5p/  First Contact Pager: [phone]  weekends / holidays): Second Contact Pager: [phone]   Chief Complaint: Mental Status Change  History of Present Illness: Adam [name] is a 81 yo male with PMHx of Dementia, CABG 2003, DM type II, HTN, Hyperlipidemia, DJD, with pacemaker that presented with altered mental status and currently resides at [facility] rehab center.  Patient was able to state his location and answer a few questions however patient was a poor historian.  History was provided by wife and daughter at bedside.  Adam [name] started becoming agitated and confused Friday night.  He also complained of pain to his abdomen and back.  He was given 1mg  of xanax at 1am Saturday morning and again at 5am because he was becoming increasingly agitated.  Following Xanax administration the patient became lethargic and could not recognize family members and this was unusual for the patient.  Wife states he was not responding to questions.  At baseline he can usually ambulate with a walker, feed and shave by himself.  He can converse but with dementia he goes in and out of reality.  Patient's wife states he has been slowly declining in the last 3 weeks while at [facility]. He was fallen more often and conversation has become more incoherent. He was placed in [facility] rehab for left sided rib fractures he received earlier in the month.  He has been receiving hydrocodone-acetaminophen scheduled twice daily since his rib fractures.       Meds: Current Facility-Administered Medications  Medication Dose Route Frequency  Provider Last Rate Last Dose  . 0.9 % NaCl with KCl 40 mEq / L  infusion   Intravenous Continuous Carly J Rivet, MD      . HYDROmorphone (DILAUDID) 1 MG/ML injection           . insulin aspart (novoLOG) injection 0-9 Units  0-9 Units Subcutaneous Q4H Carly J Rivet, MD      . insulin glargine (LANTUS) injection 10 Units  10 Units Subcutaneous QHS Carly J Rivet, MD      . sodium chloride flush (NS) 0.9 % injection 3 mL  3 mL Intravenous Q12H Carly J Rivet, MD      . warfarin (COUMADIN) tablet 1 mg  1 mg Oral ONCE-1800 Doneen Poisson, MD      . Warfarin - Pharmacist Dosing Inpatient   Does not apply [id number] Doneen Poisson, MD        Allergies: Allergies as of 01/11/2016 - Review Complete 12/29/2015  Allergen Reaction Noted  . Doxycycline Swelling    Past Medical History:  Diagnosis Date  . Anxiety   . Aortic stenosis, moderate    moderate to severe per echo 03/2012 with AVR in March of 2014  . Atrial flutter [facility])    s/p CTI ablation by Dr Ladona Ridgel 1/12  . Atrial tachycardia (HCC)   . Bifascicular block   . CAD (coronary artery disease)  s/p CABG x 5 2003 by Dr Laneta Simmers  . Complete heart block (HCC) 08/21/15   STJ PPM, Dr. Johney Frame  . DDD (degenerative disc disease)   . 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 Depression   . Diverticulosis   . DJD (degenerative joint disease)   . Hemorrhoids   . Hyperlipidemia   . Hypertension   . Nephrolithiasis   . OSA (obstructive sleep apnea)   . S/P AVR (aortic valve replacement)   . Spinal stenosis of lumbar region   . Type II or unspecified type diabetes mellitus without mention of complication, not stated as uncontrolled     Family History:  Family History  Problem Relation Age of Onset  . Heart disease Father   . Heart disease Paternal Grandfather   . Stroke Mother   . Rheum arthritis Mother   . Colon cancer Neg Hx   . Stomach cancer Neg Hx      Social History:  Social History   Social History  . Marital  status: Married    Spouse name: Lynnette  . Number of children: 5  . Years of education: college   Occupational History  . pharmacist Unemployed    retired   Social History Main Topics  . Smoking status: Former Smoker    Packs/day: 1.00    Years: 49.00    Quit date: 08/21/2001  . Smokeless tobacco: Never Used     Comment: started at age 17.   [name] Alcohol use No     Comment: quit 15 yrs ago  . Drug use: No  . Sexual activity: No   Other Topics Concern  . 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   Patient has two children and his wife has three children.   Patient has a college education.   Patient is right- handed.   Patient does not drink any caffeine.    He is a retired Teacher, early years/pre.    Review of Systems: A complete ROS was negative except as per HPI. Review of Systems  Respiratory: Positive for cough.   Cardiovascular: Positive for chest pain.  Gastrointestinal: Positive for abdominal pain. Negative for nausea and vomiting.  Genitourinary: Negative for dysuria.  Musculoskeletal: Positive for back pain.    Physical Exam: Blood pressure 138/72, pulse 89, temperature 98.4 F (36.9 C), temperature source Oral, resp. rate 18, height 5\' 6"  (1.676 m), weight 156 lb 15.5 oz (71.2 kg), SpO2 98 %. Physical Exam  Constitutional:  In mild distress.  Keeps shifting positions   HENT:  Head: Normocephalic and atraumatic.  Cardiovascular:  Murmur appreciated   Pulmonary/Chest: Effort normal and breath sounds normal. No respiratory distress.  Abdominal: Soft. He exhibits no distension. There is tenderness.  Tenderness in left upper and lower quadrant  Musculoskeletal: He exhibits no edema.  Neurological:  Patient was able to tell us he was at [facility] and lives in [place]  Skin: Skin is warm and dry.    EKG: Wide complex tachycardia  CT Abdomen Pelvis:  1. No acute findings within the abdomen or pelvis. 2.  Heterogeneous/nodular enlargement of the prostate gland, causing mass effect on the bladder base. No associated bladder distension. 3. Colonic diverticulosis without evidence of acute diverticulitis. 4. Aortic atherosclerosis. 5. Additional chronic/incidental findings detailed above.  CT Head:  No acute findings. Stable head CT. No intracranial mass, hemorrhage or edema.  Chest X ray Unchanged appearance of  the chest without evidence of acute abnormality. Cardiomegaly and chronic interstitial lung disease.  UA  Yellow, specific gravity 1.015, negative glucose, negative ketones, negative leukocytes, negative nitrites  CMP     Component Value Date/Time   NA 144 01/12/2016 0101   K 3.4 (L) 01/12/2016 0101   CL 103 01/12/2016 0101   CO2 25 01/12/2016 0044   GLUCOSE 113 (H) 01/12/2016 0101   BUN 9 01/12/2016 0101   CREATININE 0.70 01/12/2016 0101   CALCIUM 9.1 01/12/2016 0044   PROT 7.5 01/12/2016 0044   ALBUMIN 3.6 01/12/2016 0044   AST 35 01/12/2016 0044   ALT 17 01/12/2016 0044   ALKPHOS 141 (H) 01/12/2016 0044   BILITOT 0.8 01/12/2016 0044   GFRNONAA >60 01/12/2016 0044   GFRAA >60 01/12/2016 0044   CBC    Component Value Date/Time   WBC 9.9 01/12/2016 0044   RBC 5.25 01/12/2016 0044   HGB 14.6 01/12/2016 0101   HCT 43.0 01/12/2016 0101   PLT 410 (H) 01/12/2016 0044   MCV 85.9 01/12/2016 0044   MCH 28.2 01/12/2016 0044   MCHC 32.8 01/12/2016 0044   RDW 14.5 01/12/2016 0044   LYMPHSABS 2.5 01/12/2016 0044   MONOABS 0.7 01/12/2016 0044   EOSABS 0.3 01/12/2016 0044   BASOSABS 0.1 01/12/2016 0044    Assessment & Plan by Problem:    Altered mental status Patient had negative CT Head, CT abdomen pelvis and chest X ray.  UA was negative.  WBC 9.9 and patient does not have fever.  No signs of infection presented at this time.  Patient has had a decline in the last 3 weeks per wife's history.  Patient's acute confused state may have been attributed to recent  benzodiazepine use and narcotic use.  Patient was receiving Hydrocodone BID since his rib fractures on 7/9. - Urine culture - Senna-docusate - possible constipation from opioid use - Holding hydrocodone-acetaminophen  Vascular dementia Patient had dementia following a hypoxic ischemic injury - memantine    Fractured Ribs 7/9 Patient had Nondisplaced fracture the LEFT fifth through seventh ribs. - Patient receives hydrocodone-acetaminophen BID at rehab center for rib pain.  Holding this pain medication as it may have been contributing to altered mental status.   - Tylenol  TID - Lidocaine gel    DM type II  SSI   Benign Prostatic Hyperplasia CT of abdomen & pelvis showed Heterogeneous/nodular enlargement of the prostate gland, causing mass effect on the bladder base. No associated bladder distension. - resume flomax  Essential Hypertension 138/72 -Resume home medications - Losartan  daily - Metoprolol tartrate  BID   COPD Patient always keeps a cough but was gotten worse in the past 2 weeks.  Patient's Chest X-ray suggested no acute abnormality and chronic interstitial lung disease - albuterol nebulizer Q6PRN - Dulera 2 puffs BID  CAD CABG in 2003.  Patient has a mechanical aortic valve. And pacemaker - Aspirin   - Pharm consult for Warfarin   OSA -CPAP           Dispo: Admit patient to Observation with expected length of stay less than 2 midnights.  Signed: Camelia Phenes, DO 01/12/2016, 12:27 PM  Pager: [phone]

## 2016-01-12 NOTE — ED Notes (Signed)
See downtime documentation 

## 2016-01-12 NOTE — ED Provider Notes (Signed)
CSN: [id number]     Arrival date & time 01/11/16  2357 History  By signing my name below, I, Bethel Born, attest that this documentation has been prepared under the direction and in the presence of Derwood Kaplan, MD. Electronically Signed: Bethel Born, ED Scribe. 01/12/16. 7:47 AM   Chief Complaint  Patient presents with  . 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 male with PMHx of dementia, depression, and anxiety who presents to the Emergency Department complaining of altered mental status. PLEASE SEE THE SCANNED SHEET FOR FURTHER HISTORY  Past Medical History:  Diagnosis Date  . Anxiety   . Aortic stenosis, moderate    moderate to severe per echo 03/2012 with AVR in March of 2014  . Atrial flutter [facility])    s/p CTI ablation by Dr Ladona Ridgel 1/12  . Atrial tachycardia (HCC)   . Bifascicular block   . CAD (coronary artery disease)    s/p CABG x 5 2003 by Dr Laneta Simmers  . Complete heart block (HCC) 08/21/15   STJ PPM, Dr. Johney Frame  . DDD (degenerative disc disease)   . 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 Depression   . Diverticulosis   . DJD (degenerative joint disease)   . Hemorrhoids   . Hyperlipidemia   . Hypertension   . Nephrolithiasis   . OSA (obstructive sleep apnea)   . S/P AVR (aortic valve replacement)   . Spinal stenosis of lumbar region   . Type II or unspecified type diabetes mellitus without mention of complication, not stated as uncontrolled    Past Surgical History:  Procedure Laterality Date  . ANAL FISSURE REPAIR    . AORTIC VALVE REPLACEMENT N/A 09/01/2012   Procedure: AORTIC VALVE REPLACEMENT (AVR);  Surgeon: Alleen Borne, MD;  Location: [facility];  Service: Open Heart Surgery;  Laterality: N/A;  . ATRIAL ABLATION SURGERY  1/12   atrial flutter ablation by Dr Ladona Ridgel  . BACK SURGERY  2010  . CABG  2003   by Dr Laneta Simmers  . CARDIOVERSION  04/07/2012   Procedure: CARDIOVERSION;  Surgeon: Wendall Stade, MD;   Location: [facility] ENDOSCOPY;  Service: Cardiovascular;  Laterality: N/A;  . CORONARY ARTERY BYPASS GRAFT     10 yrs ago   03  . EP IMPLANTABLE DEVICE N/A 08/21/2015   STJ PPM, Dr. Johney Frame  . INTRAOPERATIVE TRANSESOPHAGEAL ECHOCARDIOGRAM N/A 09/01/2012   Procedure: INTRAOPERATIVE TRANSESOPHAGEAL ECHOCARDIOGRAM;  Surgeon: Alleen Borne, MD;  Location: [facility] OR;  Service: Open Heart Surgery;  Laterality: N/A;  . LEG SURGERY     right  . TONSILLECTOMY    . TRIGGER FINGER RELEASE     bilat.   Family History  Problem Relation Age of Onset  . Heart disease Father   . Heart disease Paternal Grandfather   . Stroke Mother   . Rheum arthritis Mother   . Colon cancer Neg Hx   . Stomach cancer Neg Hx    Social History  Substance Use Topics  . Smoking status: Former Smoker    Packs/day: 1.00    Years: 49.00    Quit date: 08/21/2001  . Smokeless tobacco: Never Used     Comment: started at age 89.   [name] Alcohol use No     Comment: quit 15 yrs ago    Review of Systems  Unable to perform ROS: Dementia      Allergies  Doxycycline  Home Medications   Prior to Admission  medications   Medication Sig Start Date End Date Taking? Authorizing Provider  albuterol (PROVENTIL HFA;VENTOLIN HFA) 108 (90 BASE) MCG/ACT inhaler Inhale 2 puffs into the lungs every 6 (six) hours as needed for wheezing. 08/09/13  Yes Barbaraann Share, MD  ALPRAZolam Prudy Feeler) 1 MG tablet Take 1 mg by mouth every 6 (six) hours as needed for anxiety.   Yes Historical Provider, MD  aspirin 81 MG tablet Take 81 mg by mouth daily.   Yes Historical Provider, MD  budesonide-formoterol (SYMBICORT) 160-4.5 MCG/ACT inhaler Inhale 2 puffs into the lungs 2 (two) times daily. 03/08/14  Yes Barbaraann Share, MD  cilostazol (PLETAL) 50 MG tablet Take 50 mg by mouth 2 (two) times daily.   Yes Historical Provider, MD  HYDROcodone-acetaminophen (NORCO/VICODIN) 5-325 MG tablet Take 1 tablet by mouth every 12 (twelve) hours.   Yes Historical Provider, MD   HYDROcodone-acetaminophen (NORCO/VICODIN) 5-325 MG tablet Take 1 tablet by mouth every 6 (six) hours as needed for moderate pain.   Yes Historical Provider, MD  insulin glargine (LANTUS) 100 unit/mL SOPN Inject 20 Units into the skin at bedtime.   Yes Historical Provider, MD  insulin lispro (HUMALOG KWIKPEN) 100 UNIT/ML KiwkPen Inject 0-12 Units into the skin 3 (three) times daily. 80-150 = 0 units 151-200 = 2 units 201-300 = 4 units 301-350 = 6 units 351-400 = 8 units >400 = 12 units and call MD   Yes Historical Provider, MD  losartan (COZAAR) 100 MG tablet Take 1 tablet (100 mg total) by mouth daily. 08/14/15  Yes [name], MD  Melatonin 3 MG CAPS Take 3 mg by mouth at bedtime. Reported on 09/04/2015   Yes Historical Provider, MD  memantine (NAMENDA) 10 MG tablet Take 1 tablet (10 mg total) by mouth 2 (two) times daily. 04/11/15  Yes Porfirio Mylar Dohmeier, MD  metoprolol tartrate (LOPRESSOR) 25 MG tablet Take 1 tablet (25 mg total) by mouth 2 (two) times daily. 09/05/15  Yes [name], MD  Multiple Vitamins-Minerals (MULTIVITAMIN WITH MINERALS) tablet Take 1 tablet by mouth daily.   Yes Historical Provider, MD  nitroGLYCERIN (NITROSTAT) 0.4 MG SL tablet Place 0.4 mg under the tongue every 5 (five) minutes as needed for chest pain.   Yes Historical Provider, MD  OXYGEN Inhale 2 L into the lungs daily as needed (SHOB/CHEST PAIN).    Yes Historical Provider, MD  senna-docusate (SENOKOT-S) 8.6-50 MG tablet Take 2 tablets by mouth 2 (two) times daily. Patient taking differently: Take 2 tablets by mouth 2 (two) times daily as needed for mild constipation.  11/02/15  Yes Alison Murray, MD  Tamsulosin HCl (FLOMAX) 0.4 MG CAPS Take 0.4 mg by mouth at bedtime.  03/23/11  Yes Historical Provider, MD  venlafaxine (EFFEXOR) 75 MG tablet Take 1 tablet (75 mg total) by mouth daily with supper. 11/02/15  Yes Alison Murray, MD  warfarin (COUMADIN) 4 MG tablet Take 7 mg by mouth daily.    Yes Historical Provider, MD    BP (!) 100/51   Pulse 110   Temp 98.3 F (36.8 C) (Oral)   Resp 18   Ht 5\' 6"  (1.676 m)   Wt 147 lb (66.7 kg)   SpO2 98%   BMI 23.73 kg/m   Physical Exam  Constitutional: He is oriented to person, place, and time. He appears well-developed and well-nourished.  HENT:  Head: Normocephalic and atraumatic.  Eyes: EOM are normal. Pupils are equal, round, and reactive to light.  Neck: Normal range of  motion. Neck supple. No JVD present.  Cardiovascular: Normal rate and regular rhythm.   Pulmonary/Chest: Effort normal and breath sounds normal. No respiratory distress. He has no wheezes.  Abdominal: Soft. Bowel sounds are normal. He exhibits no distension. There is tenderness. There is rebound. There is no guarding.  LOWER QUADRANT TENDERNESS  Musculoskeletal: He exhibits no edema.  Neurological: He is alert and oriented to person, place, and time. No cranial nerve deficit. Coordination normal.  NIHSS - 0 No objective sensory deficits, Motor strength upper and lower extremity 4+ and equal Normal cerebellar exam  Skin: Skin is warm and dry.  Vitals reviewed.   ED Course  Procedures (including critical care time) DIAGNOSTIC STUDIES:   COORDINATION OF CARE:   Labs Review Labs Reviewed  COMPREHENSIVE METABOLIC PANEL - Abnormal; Notable for the following:       Result Value   Potassium 3.3 (*)    Glucose, Bld 116 (*)    Alkaline Phosphatase 141 (*)    All other components within normal limits  CBC - Abnormal; Notable for the following:    Platelets 410 (*)    All other components within normal limits  PROTIME-INR - Abnormal; Notable for the following:    Prothrombin Time 32.8 (*)    INR 3.29 (*)    All other components within normal limits  I-STAT CHEM 8, ED - Abnormal; Notable for the following:    Potassium 3.4 (*)    Glucose, Bld 113 (*)    Calcium, Ion 1.07 (*)    All other components within normal limits  URINE CULTURE  URINE CULTURE  URINE CULTURE   DIFFERENTIAL  AMMONIA  URINALYSIS, ROUTINE W REFLEX MICROSCOPIC (NOT AT [facility])  URINALYSIS, ROUTINE W REFLEX MICROSCOPIC (NOT AT [facility])  MAGNESIUM  I-STAT TROPOININ, ED  I-STAT TROPOININ, ED  CBG MONITORING, ED    Imaging Review Dg Chest 2 View  Result Date: 01/12/2016 CLINICAL DATA:  Altered mental status. Cough and right-sided abdominal pain. EXAM: CHEST  2 VIEW COMPARISON:  12/29/2015 chest radiographs and CT FINDINGS: Sequelae of prior CABG in cardiac valve replacement are again identified. Pacemaker remains in place. Cardiac silhouette remains mildly enlarged. Lung volumes are unchanged with similar appearance of reticular opacities at both lung bases. No definite acute consolidation, pleural effusion, or pneumothorax is identified. No acute osseous abnormality is identified. IMPRESSION: Unchanged appearance of the chest without evidence of acute abnormality. Cardiomegaly and chronic interstitial lung disease. Electronically Signed   By: Sebastian Ache M.D.   On: 01/12/2016 01:48  I have personally reviewed and evaluated these images and lab results as part of my medical decision-making.   EKG Interpretation  Date/Time:  Sunday January 12 2016 00:04:12 EDT Ventricular Rate:  109 PR Interval:    QRS Duration: 163 QT Interval:  428 QTC Calculation: 577 R Axis:   -85 Text Interpretation:  Wide complex tachycardia Abnormal left axis deviation Nonspecific ST and T wave abnormality LVH with IVCD, LAD and secondary repol abnrm Prolonged QT interval , new Confirmed by Rhunette Croft, MD, Imre Vecchione [phone]) on 01/12/2016 12:15:45 AM       MDM    Final diagnoses:  Disorientation  Tachycardia   I personally performed the services described in this documentation, which was scribed in my presence. The recorded information has been reviewed and is accurate.  AMS, lower quadrant abd pain and chest pain. Pt has history significant for atrial fibrillation, dyslipidemia, pacemaker, coronary artery disease  status post CABG in 2003, vascular dementia. He  also had a recent fall and rib fractures.  Ct head is neg. Abd tenderness - CT abd is neg. UA is clean. Pt is tachycardic- paced. Trops x 2 neg. Pt has ams, no fevers, no meningismus. Likely delerium. Admit.   Derwood Kaplan, MD 01/12/16 [phone]

## 2016-01-12 NOTE — ED Notes (Signed)
Pt in EMS from [facility] rehab, LNW 48 hrs ago. Per family pt has become altered and agitated. States that he was "lethargic" earlier today. Facility gave him xanax with no relief from agitation. Pt does have hx of dementia.

## 2016-01-12 NOTE — Progress Notes (Signed)
ANTICOAGULATION CONSULT NOTE - Initial Consult  Pharmacy Consult for Coumadin Indication: atrial fibrillation / valve  Allergies  Allergen Reactions  . Doxycycline Swelling    swollen tongue     Patient Measurements: Height: 5\' 6"  (167.6 cm) Weight: 156 lb 15.5 oz (71.2 kg) IBW/kg (Calculated) : 63.8  Vital Signs: Temp: 98.4 F (36.9 C) (07/23 0908) Temp Source: Oral (07/23 0908) BP: 138/72 (07/23 0908) Pulse Rate: 89 (07/23 0908)  Labs:  Recent Labs  01/12/16 0044 01/12/16 0101  HGB 14.8 14.6  HCT 45.1 43.0  PLT 410*  --   LABPROT 32.8*  --   INR 3.29*  --   CREATININE 0.77 0.70    Estimated Creatinine Clearance: 68.7 mL/min (by C-G formula based on SCr of 0.8 mg/dL).   Medical History: Past Medical History:  Diagnosis Date  . Anxiety   . Aortic stenosis, moderate    moderate to severe per echo 03/2012 with AVR in March of 2014  . Atrial flutter [facility])    s/p CTI ablation by Dr Ladona Ridgel 1/12  . Atrial tachycardia (HCC)   . Bifascicular block   . CAD (coronary artery disease)    s/p CABG x 5 2003 by Dr Laneta Simmers  . Complete heart block (HCC) 08/21/15   STJ PPM, Dr. Johney Frame  . DDD (degenerative disc disease)   . 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 Depression   . Diverticulosis   . DJD (degenerative joint disease)   . Hemorrhoids   . Hyperlipidemia   . Hypertension   . Nephrolithiasis   . OSA (obstructive sleep apnea)   . S/P AVR (aortic valve replacement)   . Spinal stenosis of lumbar region   . Type II or unspecified type diabetes mellitus without mention of complication, not stated as uncontrolled     Assessment: 81 year old male on Coumadin PTA for Afib/AVR admitted with AMS Admit INR = 3.29 Dose PTA = 7 mg po daily (last dose 7/22)  Goal of Therapy:  INR 2-3 Monitor platelets by anticoagulation protocol: Yes   Plan:  Coumadin 1 mg po x 1 dose tonight Daily INR  Thank you Okey Regal, PharmD [phone]  Elwin Sleight 01/12/2016,11:41 AM

## 2016-01-13 DIAGNOSIS — E0590 Thyrotoxicosis, unspecified without thyrotoxic crisis or storm: Secondary | ICD-10-CM | POA: Diagnosis not present

## 2016-01-13 DIAGNOSIS — F0150 Vascular dementia without behavioral disturbance: Secondary | ICD-10-CM | POA: Diagnosis not present

## 2016-01-13 DIAGNOSIS — R000 Tachycardia, unspecified: Secondary | ICD-10-CM | POA: Diagnosis not present

## 2016-01-13 DIAGNOSIS — R410 Disorientation, unspecified: Secondary | ICD-10-CM | POA: Diagnosis not present

## 2016-01-13 DIAGNOSIS — R296 Repeated falls: Secondary | ICD-10-CM | POA: Diagnosis not present

## 2016-01-13 LAB — PROTIME-INR
INR: 5.02 — AB (ref 0.00–1.49)
Prothrombin Time: 45.1 seconds — ABNORMAL HIGH (ref 11.6–15.2)

## 2016-01-13 LAB — URINE CULTURE

## 2016-01-13 LAB — GLUCOSE, CAPILLARY
GLUCOSE-CAPILLARY: 114 mg/dL — AB (ref 65–99)
Glucose-Capillary: 138 mg/dL — ABNORMAL HIGH (ref 65–99)
Glucose-Capillary: 126 mg/dL — ABNORMAL HIGH (ref 65–99)
Glucose-Capillary: 224 mg/dL — ABNORMAL HIGH (ref 65–99)

## 2016-01-13 LAB — BASIC METABOLIC PANEL
ANION GAP: 8 (ref 5–15)
BUN: 10 mg/dL (ref 6–20)
CALCIUM: 8.6 mg/dL — AB (ref 8.9–10.3)
CO2: 23 mmol/L (ref 22–32)
Chloride: 111 mmol/L (ref 101–111)
Creatinine, Ser: 0.69 mg/dL (ref 0.61–1.24)
Glucose, Bld: 108 mg/dL — ABNORMAL HIGH (ref 65–99)
Potassium: 3.6 mmol/L (ref 3.5–5.1)
Sodium: 142 mmol/L (ref 135–145)

## 2016-01-13 MED ORDER — RAMELTEON 8 MG PO TABS
8.0000 mg | ORAL_TABLET | Freq: Every day | ORAL | Status: DC
  Administered 2016-01-13: 8 mg via ORAL
  Filled 2016-01-13 (×2): qty 1

## 2016-01-13 MED ORDER — ENSURE ENLIVE PO LIQD
237.0000 mL | Freq: Two times a day (BID) | ORAL | Status: DC
  Administered 2016-01-13: 237 mL via ORAL

## 2016-01-13 MED ORDER — HALOPERIDOL LACTATE 5 MG/ML IJ SOLN
1.0000 mg | Freq: Once | INTRAMUSCULAR | Status: AC
  Administered 2016-01-13: 1 mg via INTRAVENOUS
  Filled 2016-01-13: qty 1

## 2016-01-13 MED ORDER — FINASTERIDE 5 MG PO TABS
5.0000 mg | ORAL_TABLET | Freq: Every day | ORAL | Status: DC
  Administered 2016-01-13 – 2016-01-14 (×2): 5 mg via ORAL
  Filled 2016-01-13 (×2): qty 1

## 2016-01-13 NOTE — Progress Notes (Signed)
CRITICAL VALUE ALERT  Critical value received:  NIR = 5.02  Date of notification:  01-13-2016  Time of notification:  09:08  Critical value read back:Yes.    Nurse who received alert:  Meridee Score, RN  MD notified (1st page):  Dr. Geralyn Corwin  Time of first page:  09:36  MD notified (2nd page):  Time of second page:  Responding MD:  Dr. Geralyn Corwin  Time MD responded:  09:37
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
## 2016-01-13 NOTE — Progress Notes (Signed)
Multiple attempts to get out of bed. Patient is anxious and irritable to staff. Was sitting at the nurses station  For 30 minutes and got irritated and verbally abusive to staff. Assisted back to his room.

## 2016-01-13 NOTE — Progress Notes (Addendum)
ANTICOAGULATION CONSULT NOTE  Pharmacy Consult for warfarin Indication: atrial fibrillation / valve  Allergies  Allergen Reactions  . Doxycycline Swelling    swollen tongue     Patient Measurements: Height: 5\' 6"  (167.6 cm) Weight: 156 lb 8.4 oz (71 kg) IBW/kg (Calculated) : 63.8  Vital Signs: Temp: 99.4 F (37.4 C) (07/24 0811) Temp Source: Oral (07/24 0811) BP: 177/68 (07/24 0811) Pulse Rate: 89 (07/24 0811)  Labs:  Recent Labs  01/12/16 0044 01/12/16 0101 01/13/16 0759  HGB 14.8 14.6  --   HCT 45.1 43.0  --   PLT 410*  --   --   LABPROT 32.8*  --  45.1*  INR 3.29*  --  5.02*  CREATININE 0.77 0.70 0.69    Estimated Creatinine Clearance: 68.7 mL/min (by C-G formula based on SCr of 0.8 mg/dL).   Medical History: Past Medical History:  Diagnosis Date  . Anxiety   . Aortic stenosis, moderate    moderate to severe per echo 03/2012 with AVR in March of 2014  . Atrial flutter [facility])    s/p CTI ablation by Dr Ladona Ridgel 1/12  . Atrial tachycardia (HCC)   . Bifascicular block   . CAD (coronary artery disease)    s/p CABG x 5 2003 by Dr Laneta Simmers  . Complete heart block (HCC) 08/21/15   [facility] PPM, Dr. Johney Frame  . DDD (degenerative disc disease)   . 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 Depression   . Diverticulosis   . DJD (degenerative joint disease)   . Hemorrhoids   . Hyperlipidemia   . Hypertension   . Nephrolithiasis   . OSA (obstructive sleep apnea)   . S/P AVR (aortic valve replacement)   . Spinal stenosis of lumbar region   . Type II or unspecified type diabetes mellitus without mention of complication, not stated as uncontrolled     Assessment: 81 year old male on warfarin PTA for Afib/AVR admitted with AMS INR this am is 5, will hold dose and monitor for bleeding. No bleeding noted. CBC from yesterday. PO intake at 0% until dinner yesterday. RN endorses patient has started eating a little.  Dose PTA = 7 mg po daily (last dose  7/22, Admit INR = 3.29)  Goal of Therapy:  INR 2-3 Monitor platelets by anticoagulation protocol: Yes   Plan:  Hold warfarin tonight and follow up INR and CBC in am Monitor clinical course, s/sx of bleed, PO intake, DDI   Thank you for allowing [place] to participate in this patients care. Signe Colt, PharmD Phone [phone] 01/13/2016,10:25 AM

## 2016-01-13 NOTE — Progress Notes (Signed)
Initial Nutrition Assessment  DOCUMENTATION CODES:   Not applicable  INTERVENTION:  Provide Ensure Enlive po BID, each supplement provides 350 kcal and 20 grams of protein.  Encourage adequate PO intake.   NUTRITION DIAGNOSIS:   Inadequate oral intake related to  (agitation/confusion) as evidenced by  (varied meal completion 25-100%).  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  Pt was agitated and wanting to leave during time of visit. Nurse tech at bedside attempting to calm patient. RD unable to obtain nutrition history. No family at bedside. Per Epic weight records, pt with a 12.8% weight loss in 4 months. Current meal completion has been varied from 25-100%. RD to order Ensure to aid in caloric and protein needs.  Unable to complete Nutrition-Focused physical exam at this time. RD to perform at next visit.   Labs and medications reviewed.   Diet Order:  Diet Heart Room service appropriate? Yes; Fluid consistency: Thin  Skin:  Reviewed, no issues  Last BM:  7/24  Height:   Ht Readings from Last 1 Encounters:  01/12/16 5\' 6"  (1.676 m)    Weight:   Wt Readings from Last 1 Encounters:  01/13/16 156 lb 8.4 oz (71 kg)    Ideal Body Weight:  64.5 kg  BMI:  Body mass index is 25.26 kg/m.  Estimated Nutritional Needs:   Kcal:  1700-1900  Protein:  75-85 grams  Fluid:  1.7 - 1.9 L/day  EDUCATION NEEDS:   Education needs no appropriate at this time  Roslyn Smiling, MS, RD, LDN Pager # [phone] After hours/ weekend pager # [phone]

## 2016-01-13 NOTE — Progress Notes (Signed)
Sitter discontinued.  Restraints were also discontinued earlier in the day.  Peri Maris, MBA, BSN, RN

## 2016-01-13 NOTE — Progress Notes (Signed)
Patient up all night. Continue to be anxious and agitated. Posey belt in place. Will contiue to monitor.

## 2016-01-13 NOTE — NC FL2 (Signed)
[place] MEDICAID FL2 LEVEL OF CARE SCREENING TOOL     IDENTIFICATION  Patient Name: Adam Passe, [name] Birthdate: 05-Nov-1934 Sex: male Admission Date (Current Location): 01/11/2016  [facility] and [place] Number:  Producer, television/film/video and Address:  The [place], [address], [place], [place] [address]      Provider Number: [id number]  Attending Physician Name and Address:  Doneen Poisson, MD  Relative Name and Phone Number:  Alok Mezzanotte - wife.  Phone #[phone] (mobile) and [phone] (home)    Current Level of Care: Hospital Recommended Level of Care: Skilled Nursing Facility Prior Approval Number:    Date Approved/Denied:   PASRR Number: [id number] (Eff. 10/29/15)  Discharge Plan: SNF    Current Diagnoses: Patient Active Problem List   Diagnosis Date Noted  . Altered mental status 10/28/2015  . Acute encephalopathy 10/28/2015  . Supratherapeutic INR 10/28/2015  . Fall 10/28/2015  . Mobitz type II atrioventricular block 08/21/2015  . Complete heart block (HCC) 08/21/2015  . Syncope 08/14/2015  . Vascular dementia 08/12/2015  . Memory loss 10/11/2014  . CAP (community acquired pneumonia) 08/28/2014  . OSA on CPAP 10/10/2013  . Hypersomnia with sleep apnea 10/10/2013  . Bifascicular block 08/03/2013  . Encounter for therapeutic drug monitoring 07/20/2013  . Insomnia, persistent 05/29/2013  . Unspecified sleep apnea 05/29/2013  . 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 OSA (obstructive sleep apnea)   . H/O mechanical aortic valve replacement 09/08/2012  . Long term (current) use of anticoagulants 09/08/2012  . S/P CABG x 5 08/19/2012  . Atrial tachycardia (HCC) 04/25/2012  . Fall from furniture 03/30/2011  . CAD (coronary artery disease) 09/30/2010  . Atrial flutter (HCC) 09/30/2010  . Aortic stenosis 09/30/2010  . Obese 09/30/2010  . Atrial fibrillation (HCC) 09/30/2010  . DYSPNEA 12/25/2007  .  Diabetes mellitus (HCC) 11/16/2007  . HYPERLIPIDEMIA 11/16/2007  . Essential hypertension 11/16/2007  . COPD (chronic obstructive pulmonary disease) (HCC) 11/16/2007    Orientation RESPIRATION BLADDER Height & Weight     Self, Situation, Place  Normal Continent Weight: 156 lb 8.4 oz (71 kg) Height:  5\' 6"  (167.6 cm)  BEHAVIORAL SYMPTOMS/MOOD NEUROLOGICAL BOWEL NUTRITION STATUS      Continent Diet (Heart Healthy)  AMBULATORY STATUS COMMUNICATION OF NEEDS Skin   Limited Assist Verbally Normal                       Personal Care Assistance Level of Assistance  Bathing, Feeding, Dressing Bathing Assistance: Limited assistance Feeding assistance: Independent Dressing Assistance: Limited assistance     Functional Limitations Info  Sight, Hearing, Speech Sight Info: Adequate Hearing Info: Adequate Speech Info: Adequate    SPECIAL CARE FACTORS FREQUENCY                       Contractures Contractures Info: Not present    Additional Factors Info  Code Status, Allergies Code Status Info: DNR Allergies Info: Doxycycline           Current Medications (01/13/2016):  This is the current hospital active medication list Current Facility-Administered Medications  Medication Dose Route Frequency Provider Last Rate Last Dose  . acetaminophen (TYLENOL) tablet 1,000 mg  1,000 mg Oral TID Su Hoff, MD   1,000 mg at 01/13/16 1027  . albuterol (PROVENTIL) (2.5 MG/3ML) 0.083% nebulizer solution 2.5 mg  2.5 mg Nebulization Q6H PRN Su Hoff, MD      .  aspirin EC tablet 81 mg  81 mg Oral Daily Su Hoff, MD   81 mg at 01/13/16 1027  . cilostazol (PLETAL) tablet 50 mg  50 mg Oral BID Su Hoff, MD   50 mg at 01/13/16 1029  . feeding supplement (ENSURE ENLIVE) (ENSURE ENLIVE) liquid 237 mL  237 mL Oral BID BM Doneen Poisson, MD      . finasteride (PROSCAR) tablet 5 mg  5 mg Oral Daily Carly J Rivet, MD      . insulin aspart (novoLOG) injection 0-5 Units  0-5 Units  Subcutaneous QHS Carly J Rivet, MD      . insulin aspart (novoLOG) injection 0-9 Units  0-9 Units Subcutaneous TID WC Carly J Rivet, MD      . insulin glargine (LANTUS) injection 10 Units  10 Units Subcutaneous QHS Su Hoff, MD   10 Units at 01/12/16 2123  . lidocaine (XYLOCAINE) 2 % jelly 1 application  1 application Topical TID Su Hoff, MD   1 application at 01/13/16 1030  . losartan (COZAAR) tablet 100 mg  100 mg Oral Daily Su Hoff, MD   100 mg at 01/13/16 1027  . memantine (NAMENDA) tablet 10 mg  10 mg Oral BID Su Hoff, MD   10 mg at 01/13/16 1033  . metoprolol tartrate (LOPRESSOR) tablet 25 mg  25 mg Oral BID Su Hoff, MD   25 mg at 01/13/16 1027  . mometasone-formoterol (DULERA) 200-5 MCG/ACT inhaler 2 puff  2 puff Inhalation BID Su Hoff, MD   2 puff at 01/13/16 1029  . ramelteon (ROZEREM) tablet 8 mg  8 mg Oral QHS Carly J Rivet, MD      . senna-docusate (Senokot-S) tablet 2 tablet  2 tablet Oral BID PRN Su Hoff, MD      . sodium chloride flush (NS) 0.9 % injection 3 mL  3 mL Intravenous Q12H Carly J Rivet, MD   3 mL at 01/13/16 1036  . tamsulosin (FLOMAX) capsule 0.4 mg  0.4 mg Oral QHS Su Hoff, MD   0.4 mg at 01/12/16 2118  . venlafaxine (EFFEXOR) tablet 75 mg  75 mg Oral Q supper Su Hoff, MD   75 mg at 01/12/16 1640  . Warfarin - Pharmacist Dosing Inpatient   Does not apply q1800 Doneen Poisson, MD   Stopped at 01/13/16 1800     Discharge Medications: Please see discharge summary for a list of discharge medications.  Relevant Imaging Results:  Relevant Lab Results:   Additional Information    Okey Dupre, Lazaro Arms, LCSW

## 2016-01-13 NOTE — Progress Notes (Signed)
   Subjective: Adam [name] was evaluated this morning on rounds.  He had just had a bedside wash with assistance.  He was sitting up in bed and appears comfortable.  Patient was alert and had some incoherent sentences.  He was able to follow commands and over all was in a pleasant mood and at times joking.  He no longer endorses abdominal pain and denies SOB or chest pain.  Objective: Vital signs in last 24 hours: Vitals:   01/12/16 2100 01/12/16 2126 01/13/16 0414 01/13/16 0811  BP:  (!) 159/73 (!) 155/67 (!) 177/68  Pulse:  98 94 89  Resp:  18 18 17   Temp:  98.2 F (36.8 C) 97.7 F (36.5 C) 99.4 F (37.4 C)  TempSrc:  Oral Oral Oral  SpO2: 99% 100% 92% 98%  Weight:   156 lb 8.4 oz (71 kg)   Height:       Physical Exam Physical Exam  Constitutional: He is well-developed, well-nourished, and in no distress.  HENT:  Head: Normocephalic and atraumatic.  Eyes: Pupils are equal, round, and reactive to light.  Cardiovascular:  Murmur appreciated  Pulmonary/Chest: Effort normal. No respiratory distress.  Abdominal: Soft. He exhibits no distension. There is no tenderness.  Musculoskeletal: He exhibits no edema.  Skin: Skin is warm and dry.    Assessment/Plan: Delirium  Patient appears more alert and mood is pleasant this morning.  He is no longer complaining of abdominal pain. Per wife's history patient had a bowel movement this morning.  With significant turn around it appears that the narcotic and benzodiazepine combination along with acute pain may have precipitated a delirious state from an already underlying dementia.  UA was negative and Urine culture showed multiple species present and recollection was recommended.  With UA negative and no symptoms of dysuria or abdominal pain UTI is not a concern. - Holding hydrocodone-acetaminophen - Council family on benzodiazepine use and opioid use   Vascular dementia - memantine 10mg    Fractured Ribs Patient is no longer  complaining of back pain or abdominal pain.  - Tylenol 1000mg  TID - Lidocaine gel    DM type II  SSI   Benign Prostatic Hyperplasia With an increase in falls in the past 3 weeks.  Flomax may not be the only option for BPH as it can attribute to his falls by decreasing his BP.  Patient may benefit from another treatment that would not affect BP, such as finasteride.  However, finasteride may take several weeks to be effective.  May consider starting on finasteride now and with follow up of PCP may be able to discontinue flomax - continue flomax - consider adding finasteride  Essential Hypertension 177/68, patient has been hypertensive.  This is a chronic issue.  May need BP medication management outpatient - Losartan 100mg  daily - Metoprolol tartrate 25mg  BID   COPD - albuterol nebulizer Q6PRN - Dulera 2 puffs BID  CAD - Aspirin  81mg  - Warfarin, current INR is 5.02 Pharm is following  OSA -CPAP  Dispo: Anticipated discharge today   LOS: 0 days   Camelia Phenes, DO 01/13/2016, 11:07 AM Pager: [phone]

## 2016-01-13 NOTE — Discharge Summary (Signed)
Name: Adam IMHOF, [name] MRN: [id number] DOB: [birth year] 81 y.o. PCP: Jarome Matin, MD  Date of Admission: 01/12/2016  Date of Discharge: 01/14/2016 Attending Physician: Doneen Poisson, MD  Discharge Diagnosis: 1. Acute delirium on chronic vascular dementia 2.  Hyperthyroidism   Discharge Medications:   Medication List    STOP taking these medications   ALPRAZolam 1 MG tablet Commonly known as:  XANAX   HYDROcodone-acetaminophen 5-325 MG tablet Commonly known as:  NORCO/VICODIN   Melatonin 3 MG Caps     TAKE these medications   acetaminophen 500 MG tablet Commonly known as:  TYLENOL Take 2 tablets (1,000 mg total) by mouth 3 (three) times daily.   albuterol 108 (90 Base) MCG/ACT inhaler Commonly known as:  PROVENTIL HFA;VENTOLIN HFA Inhale 2 puffs into the lungs every 6 (six) hours as needed for wheezing.   aspirin 81 MG tablet Take 81 mg by mouth daily.   budesonide-formoterol 160-4.5 MCG/ACT inhaler Commonly known as:  SYMBICORT Inhale 2 puffs into the lungs 2 (two) times daily.   cilostazol 50 MG tablet Commonly known as:  PLETAL Take 50 mg by mouth 2 (two) times daily.   finasteride 5 MG tablet Commonly known as:  PROSCAR Take 1 tablet (5 mg total) by mouth daily.   HUMALOG KWIKPEN 100 UNIT/ML KiwkPen Generic drug:  insulin lispro Inject 0-12 Units into the skin 3 (three) times daily. 80-150 = 0 units 151-200 = 2 units 201-300 = 4 units 301-350 = 6 units 351-400 = 8 units >400 = 12 units and call MD   insulin glargine 100 unit/mL Sopn Commonly known as:  LANTUS Inject 20 Units into the skin at bedtime.   lidocaine 2 % jelly Commonly known as:  XYLOCAINE Apply 1 application topically 3 (three) times daily.   losartan 100 MG tablet Commonly known as:  COZAAR Take 1 tablet (100 mg total) by mouth daily.   memantine 10 MG tablet Commonly known as:  NAMENDA Take 1 tablet (10 mg total) by mouth 2 (two) times daily.   metoprolol tartrate 25 MG  tablet Commonly known as:  LOPRESSOR Take 1 tablet (25 mg total) by mouth 2 (two) times daily.   multivitamin with minerals tablet Take 1 tablet by mouth daily.   nitroGLYCERIN 0.4 MG SL tablet Commonly known as:  NITROSTAT Place 0.4 mg under the tongue every 5 (five) minutes as needed for chest pain.   OXYGEN Inhale 2 L into the lungs daily as needed (SHOB/CHEST PAIN).   ramelteon 8 MG tablet Commonly known as:  ROZEREM Take 1 tablet (8 mg total) by mouth at bedtime.   senna-docusate 8.6-50 MG tablet Commonly known as:  Senokot-S Take 2 tablets by mouth 2 (two) times daily. What changed:  when to take this  reasons to take this   tamsulosin 0.4 MG Caps capsule Commonly known as:  FLOMAX Take 0.4 mg by mouth at bedtime.   venlafaxine 75 MG tablet Commonly known as:  EFFEXOR Take 1 tablet (75 mg total) by mouth daily with supper.   warfarin 4 MG tablet Commonly known as:  COUMADIN Take 7 mg by mouth daily.       Disposition and follow-up:   Mr.Tyrie Jamison Neighbor, [name] was discharged from [facility] in [facility] condition.  At the hospital follow up visit please address:  1.  Radioiodine uptake study, flomax discontinuation in the future, benzodiazepine and narcotic discontinuation, BP Control   2.  Labs / imaging needed at time  of follow-up: Radioiodine uptake study   3.  Pending labs/ test needing follow-up: none  Follow-up Appointments: Follow-up Information    Garlan Fillers, MD. Schedule an appointment as soon as possible for a visit in 1 week(s).   Specialty:  Internal Medicine Contact information: [address] [place] [address] [phone]           Hospital Course by problem list:  Acute delirium on chronic vascular dementia Mr. Paola presented with altered mental status.  Per wife's history prior to admission patient became agitated and confused and was given 2mg  of Xanax at [facility] rehab center.  Subsequently  he became lethargic and could not recognize family members and this was unusual for the patient and he was brought into the ED.  He also complained of pain to his abdomen and back.  During admission patient had a negative CT Head, CT abdomen pelvis and chest X ray.  UA was negative. WBC 9.9 and patient did not have fevers.  Patient's acute confused state may have been attributed to recent benzodiazepine use and narcotic use along with acute pain in back and abdomen.  Patient's narcotics were held and pain was managed with tylenol 1000mg  TID and lidocaine gel, as he had rib fractures earlier in the month.  On night of admission patient became more confused and agitated, patient received 1mg  Haldol and required a posey belt.  The following morning 7/24 patient was pleasant and cooperative during the interview.  Patient was taken out of posey belt and had a sitter.  Patient stayed overnight and did not require any Haldol and was given Ramelteon 8mg  before bed. Wife stayed with patient overnight and reported that Mr. Munro had a good night and slept from 9pm-7am.  On morning of discharge 7/25 patient appeared pleasant and comfortable.  Wife reported that patient was at baseline.                  Recent falls With an increase in falls in the past 3 weeks.  Flomax may not be the only option for BPH as it can attribute to his falls by decreasing his BP.  Finasteride was started and may take several weeks to be effective.  Flomax can be continued and stopped in a few weeks.    Fractured Ribs  On 7/9 Patient had Nondisplaced fracture on the left fifth through seventh ribs.  Patient receives hydrocodone-acetaminophen BID at rehab center for rib pain.  During admission this medication was held and Tylenol 1000mg  TID and lidocaine gel was used for pain management.    Hyperthyroidism During admission it was noted that the patient's TSH was 0.297 and Free T4 was 1.73.  Patient will need a radioiodine uptake study.   Patient received contrast with CT abdominal scan and radioiodine study would be best outpatient.  Suspicion is low that the hyperthyroidism was contributing to the acute delirium as  patient currently appears at baseline without further medical management.    DM type II  SSI    Vascular dementia Memantine 10mg  was continued  Essential Hypertension Continued Home medication Losartan 100mg  daily and Metoprolol tartrate 25mg  BID  CAD Continued Aspirin 81mg .  Warfarin was given and followed by pharmacy.  Discharge INR is 2.77  COPD  Continued albuterol nebulizer Q6PRN and Dulera 2 puffs BID  Discharge Vitals:   BP (!) 138/59 (BP Location: Left Arm)   Pulse 77   Temp 98.3 F (36.8 C) (Oral)   Resp 18  Ht 5\' 6"  (1.676 m)   Wt 156 lb 8.4 oz (71 kg)   SpO2 95%   BMI 25.26 kg/m   Pertinent Labs, Studies, and Procedures:   CT Abdomen Pelvis:  1. No acute findings within the abdomen or pelvis. 2. Heterogeneous/nodular enlargement of the prostate gland, causing mass effect on the bladder base. No associated bladder distension. 3. Colonic diverticulosis without evidence of acute diverticulitis. 4. Aortic atherosclerosis. 5. Additional chronic/incidental findings detailed above.  CT Head:  No acute findings. Stable head CT. No intracranial mass, hemorrhage or edema.  Chest X ray Unchanged appearance of the chest without evidence of acute abnormality. Cardiomegaly and chronic interstitial lung disease.  UA  Yellow, specific gravity 1.015, negative glucose, negative ketones, negative leukocytes, negative nitrites  Discharge Instructions: Discharge Instructions    Diet - low sodium heart healthy    Complete by:  As directed   Discharge instructions    Complete by:  As directed   Please take Ramelteon 1 pill before bed time to help with sleep Please stop taking benzodizepines and Opioids Please continue taking Tylenol 1000mg  3 times daily and lidocaine gel to manage pain.   You may take as needed after 1 week   Increase activity slowly    Complete by:  As directed      Signed: Camelia Phenes, DO 01/14/2016, 2:59 PM   Pager: [phone]

## 2016-01-13 NOTE — Clinical Social Work Note (Signed)
Clinical Social Work Assessment  Patient Details  Name: Adam RAPER, [name] MRN: [id number] Date of Birth: 08/14/1934  Date of referral:  01/13/16               Reason for consult:  Facility Placement                Permission sought to share information with:  Family Supports Permission granted to share information::     Name::     Adam [name]  Agency::     Relationship::  Wife  Contact Information:  [phone] (cell) and [phone] (home)  Housing/Transportation Living arrangements for the past 2 months:  Skilled Nursing Facility [name]) Source of Information:  Facility, Spouse Patient Interpreter Needed:  None Criminal Activity/Legal Involvement Pertinent to Current Situation/Hospitalization:  No - Comment as needed Significant Relationships:  Spouse Lives with:  Facility Resident [name]) Do you feel safe going back to the place where you live?  Yes (Mrs. Suarez feels comfortable with patient returning to facility) Need for family participation in patient care:  Yes (Comment)  Care giving concerns:  None expressed by patient's wife regarding patient's care at skilled facility.   Social Worker assessment / plan:  CSW talked with patient's wife by phone regarding discharge plan as patient is from skilled facility. Mrs. Yoho indicated that patient will return to [facility]. Mrs. Chihuahua also clarified patient insurance as Medicare A&B and reported that insurance changed from [employer] back to Medicare eff. 11/21/15.   Employment status:  Retired Community education officer information:  Medicare (Mrs. Hubbert reported that effective 11/21/15, they dropped BCBS and went back to Medicare A&B) PT Recommendations:  Not assessed at this time Information / Referral to community resources:  Other (Comment Required) (None needed or requested as patient from a skilled facility )  Patient/Family's Response to care:  No concerns expressed regarding care during  hospitalization.  Patient/Family's Understanding of and Emotional Response to Diagnosis, Current Treatment, and Prognosis:  Not discussed.  Emotional Assessment Appearance:  Appears stated age Attitude/Demeanor/Rapport:  Other (Appropriate) Affect (typically observed):  Pleasant, Appropriate Orientation:  Oriented to Self, Oriented to Place, Oriented to Situation Alcohol / Substance use:  Tobacco Use (Patient reports that he quit smoking in 2003 and no alchohol consumption or illicit drug use.) Psych involvement (Current and /or in the community):  No (Comment)  Discharge Needs  Concerns to be addressed:  Discharge Planning Concerns Readmission within the last 30 days:  No Current discharge risk:  None Barriers to Discharge:  No Barriers Identified   Cristobal Goldmann, LCSW 01/13/2016, 4:54 PM

## 2016-01-13 NOTE — Progress Notes (Signed)
Patient noted to be attempting to get out of bed with waist belt on. Entered room and attempted to redirect patient. Patient agitated and stating that the phone service doesn't work. Showed patient how to use the phone. Patient stated he wanted to call his wife. Dialed number and allowed patient to speak with his wife. Patient continues to be agitated and disoriented.Will continue to monitor patient.  Aaryn Parrilla, Charlyne Quale

## 2016-01-14 DIAGNOSIS — E0590 Thyrotoxicosis, unspecified without thyrotoxic crisis or storm: Secondary | ICD-10-CM

## 2016-01-14 DIAGNOSIS — R000 Tachycardia, unspecified: Secondary | ICD-10-CM | POA: Diagnosis not present

## 2016-01-14 DIAGNOSIS — R410 Disorientation, unspecified: Secondary | ICD-10-CM | POA: Diagnosis not present

## 2016-01-14 DIAGNOSIS — F0150 Vascular dementia without behavioral disturbance: Secondary | ICD-10-CM | POA: Diagnosis not present

## 2016-01-14 LAB — CBC
HCT: 39.7 % (ref 39.0–52.0)
HEMOGLOBIN: 12.8 g/dL — AB (ref 13.0–17.0)
MCH: 27.8 pg (ref 26.0–34.0)
MCHC: 32.2 g/dL (ref 30.0–36.0)
MCV: 86.1 fL (ref 78.0–100.0)
Platelets: 358 10*3/uL (ref 150–400)
RBC: 4.61 MIL/uL (ref 4.22–5.81)
RDW: 14.4 % (ref 11.5–15.5)
WBC: 7.1 10*3/uL (ref 4.0–10.5)

## 2016-01-14 LAB — PROTIME-INR
INR: 2.77 — ABNORMAL HIGH (ref 0.00–1.49)
PROTHROMBIN TIME: 28.8 s — AB (ref 11.6–15.2)

## 2016-01-14 LAB — GLUCOSE, CAPILLARY
GLUCOSE-CAPILLARY: 115 mg/dL — AB (ref 65–99)
Glucose-Capillary: 196 mg/dL — ABNORMAL HIGH (ref 65–99)

## 2016-01-14 MED ORDER — FINASTERIDE 5 MG PO TABS: 5.0000 mg | ORAL_TABLET | Freq: Every day | ORAL | 1 refills | Status: AC

## 2016-01-14 MED ORDER — RAMELTEON 8 MG PO TABS: 8.0000 mg | ORAL_TABLET | Freq: Every day | ORAL | 1 refills | Status: DC

## 2016-01-14 MED ORDER — LIDOCAINE HCL 2 % EX GEL: 1.0000 "application " | Freq: Three times a day (TID) | CUTANEOUS | 0 refills | Status: DC

## 2016-01-14 MED ORDER — ACETAMINOPHEN 500 MG PO TABS: 1000.0000 mg | ORAL_TABLET | Freq: Three times a day (TID) | ORAL | 1 refills | Status: AC

## 2016-01-14 MED ORDER — WARFARIN SODIUM 6 MG PO TABS
7.0000 mg | ORAL_TABLET | Freq: Once | ORAL | Status: DC
  Filled 2016-01-14: qty 1

## 2016-01-14 NOTE — Progress Notes (Signed)
   Subjective:  Mr. Holte was evaluated on rounds this morning.  He was sitting in his chair reading the newspaper.  He stated he had a good night with restful sleep.  He ate his entire breakfast.  His wife had stayed with him overnight and reported him sleeping from 9pm -7am with no issues.  She stated he appeared at his baseline.  Patient denied SOB, chest pain, abdominal or back pain.    Objective:  Vital signs in last 24 hours: Vitals:   01/13/16 2116 01/14/16 0554 01/14/16 0918 01/14/16 0942  BP: (!) 142/66 (!) 144/56 (!) 138/59   Pulse: 96 78 77   Resp: 18 17 18    Temp: 98.9 F (37.2 C) 98.3 F (36.8 C) 98.3 F (36.8 C)   TempSrc:  Oral Oral   SpO2: 93% 93% 93% 95%  Weight:      Height:       Physical Exam  Constitutional: He is well-developed, well-nourished, and in no distress.  HENT:  Head: Normocephalic and atraumatic.  Eyes: Pupils are equal, round, and reactive to light.  Cardiovascular:  Murmur appreciated  Pulmonary/Chest: Effort normal and breath sounds normal.  Abdominal: Soft. He exhibits no distension. There is no tenderness.  Musculoskeletal: He exhibits no edema.  Neurological: He is alert.  Skin: Skin is warm and dry.  Psychiatric: Mood and affect normal.     Assessment/Plan: Acute delirium on chronic vascular dementia Likely due to the combination of pain, opioid and benzodiazepine use.  Patient will be discharged with tylenol 1000mg  TID and lidocaine gel.  Patient and family counseled on opioid and benzodiazepine avoidance.  Today patient is pleasant, cooperative and per wife is at baseline.  Patient was given Ramelteon 8mg  prior to bedtime yesterday as he was having restlessness the previous night.  This appears to have helped as patient did not have an issues overnight. -1000mg  TID  - lidocaine gel. - Ramelteon 8mg  prior to bed time   Benign Prostatic Hyperplasia Finasteride was started and may take several weeks to be effective.  Flomax can be  continued and stopped in a few weeks. -Finasteride 5mg  daily  Hyperthyroidism TSH is 0.297 and Free T4 is 1.73.  Patient will need a radioiodine uptake study.  Patient received contrast with CT abdominal scan and radioiodine study would be best outpatient.  Suspicion is low that the hyperthyroidism was contributing to the acute delirium as patient currently appears at baseline without further medical management.  - Radioiodine uptake study outpatient    Fractured Ribs    Continue current management  - Tylenol 1000mg  TID - Lidocaine gel   Essential Hypertension - Losartan 100mg  daily - Metoprolol tartrate 25mg  BID  DM type II  SSI   COPD - albuterol nebulizer Q6PRN - Dulera 2 puffs BID  CAD - Aspirin 81mg  - Warfarin per pharmacy  Dispo: Anticipated discharge today.  LOS: 0 days   Camelia Phenes, DO 01/14/2016, 3:03 PM Pager: [phone]

## 2016-01-14 NOTE — Clinical Social Work Note (Signed)
[name] will [name] today, transported by [facility]. Admissions director Herbert Seta contacted regarding today's discharge and discharge clinicals transmitted to facility. Nurse provided with facility phone number to call report. Patient's wife has been at the bedside today and is aware of discharge.   Genelle Bal, MSW, LCSW Licensed Clinical Social Worker Clinical Social Work Department [employer] [phone]

## 2016-01-14 NOTE — Progress Notes (Signed)
ANTICOAGULATION CONSULT NOTE  Pharmacy Consult for warfarin Indication: atrial fibrillation / valve  Allergies  Allergen Reactions  . Doxycycline Swelling    swollen tongue     Patient Measurements: Height: 5\' 6"  (167.6 cm) Weight: 156 lb 8.4 oz (71 kg) IBW/kg (Calculated) : 63.8  Vital Signs: Temp: 98.3 F (36.8 C) (07/25 0918) Temp Source: Oral (07/25 0918) BP: 138/59 (07/25 0918) Pulse Rate: 77 (07/25 0918)  Labs:  Recent Labs  01/12/16 0044 01/12/16 0101 01/13/16 0759 01/14/16 0650  HGB 14.8 14.6  --  12.8*  HCT 45.1 43.0  --  39.7  PLT 410*  --   --  358  LABPROT 32.8*  --  45.1* 28.8*  INR 3.29*  --  5.02* 2.77*  CREATININE 0.77 0.70 0.69  --     Estimated Creatinine Clearance: 68.7 mL/min (by C-G formula based on SCr of 0.8 mg/dL).   Medical History: Past Medical History:  Diagnosis Date  . Anxiety   . Aortic stenosis, moderate    moderate to severe per echo 03/2012 with AVR in March of 2014  . Atrial flutter [facility])    s/p CTI ablation by Dr Ladona Ridgel 1/12  . Atrial tachycardia (HCC)   . Bifascicular block   . CAD (coronary artery disease)    s/p CABG x 5 2003 by Dr Laneta Simmers  . Complete heart block (HCC) 08/21/15   [facility] PPM, Dr. Johney Frame  . DDD (degenerative disc disease)   . 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 Depression   . Diverticulosis   . DJD (degenerative joint disease)   . Hemorrhoids   . Hyperlipidemia   . Hypertension   . Nephrolithiasis   . OSA (obstructive sleep apnea)   . S/P AVR (aortic valve replacement)   . Spinal stenosis of lumbar region   . Type II or unspecified type diabetes mellitus without mention of complication, not stated as uncontrolled     Assessment: 81 year old male on warfarin PTA for Afib/AVR admitted with AMS INR this am is down to 2.77 from 5 yesterday, will restart tonight. No bleeding noted. Hgb down slightly to 12.8, plt wnl. PO intake much improved.   Dose PTA = 7 mg po daily (last  dose 7/22, Admit INR = 3.29)  Goal of Therapy:  INR 2-3 Monitor platelets by anticoagulation protocol: Yes   Plan:  Give warfarin 7 mg po x 1 Monitor daily INR, CBC, clinical course, s/sx of bleed, PO intake, DDI   Thank you for allowing [place] to participate in this patients care. Signe Colt, PharmD Phone [phone] 01/14/2016,12:18 PM

## 2016-01-14 NOTE — Progress Notes (Signed)
Nutrition Follow-up  DOCUMENTATION CODES:   Not applicable  INTERVENTION:  Recommend continuation of nutritional supplements post discharge if po intake is poor.   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  Meal completion has been varied from 25-100%. Pt reports having a good appetite currently and PTA with usual consumption of at least 3 meals a day. Pt currently has Ensure ordered and has been consuming them. RD to continue with orders. Recommend continuation of nutritional supplementation post discharge is po intake is poor. Plans for possible discharge today.   Pt with no observed significant fat or muscle mass loss.   Diet Order:  Diet Heart Room service appropriate? Yes; Fluid consistency: Thin  Skin:  Reviewed, no issues  Last BM:  7/24  Height:   Ht Readings from Last 1 Encounters:  01/12/16 '5\' 6"'$  (1.676 m)    Weight:   Wt Readings from Last 1 Encounters:  01/13/16 156 lb 8.4 oz (71 kg)    Ideal Body Weight:  64.5 kg  BMI:  Body mass index is 25.26 kg/m.  Estimated Nutritional Needs:   Kcal:  1700-1900  Protein:  75-85 grams  Fluid:  1.7 - 1.9 L/day  EDUCATION NEEDS:   Education needs no appropriate at this time  Corrin Parker, MS, RD, LDN Pager # [phone] After hours/ weekend pager # [phone]

## 2016-01-15 ENCOUNTER — Encounter: Payer: Medicare Other | Admitting: Internal Medicine

## 2016-01-31 LAB — PROTIME-INR: INR: 2.2 — AB (ref 0.9–1.1)

## 2016-02-05 ENCOUNTER — Inpatient Hospital Stay (HOSPITAL_COMMUNITY)
Admission: EM | Admit: 2016-02-05 | Discharge: 2016-02-10 | DRG: 086 | Disposition: A | Discharge status: Home Health | Payer: Medicare Other | Attending: Internal Medicine | Admitting: Internal Medicine

## 2016-02-05 ENCOUNTER — Emergency Department (HOSPITAL_COMMUNITY): Payer: Medicare Other

## 2016-02-05 ENCOUNTER — Encounter (HOSPITAL_COMMUNITY): Payer: Self-pay | Admitting: *Deleted

## 2016-02-05 DIAGNOSIS — S0003XA Contusion of scalp, initial encounter: Secondary | ICD-10-CM | POA: Diagnosis not present

## 2016-02-05 DIAGNOSIS — I480 Paroxysmal atrial fibrillation: Secondary | ICD-10-CM | POA: Diagnosis present

## 2016-02-05 DIAGNOSIS — Z8249 Family history of ischemic heart disease and other diseases of the circulatory system: Secondary | ICD-10-CM

## 2016-02-05 DIAGNOSIS — Z881 Allergy status to other antibiotic agents status: Secondary | ICD-10-CM

## 2016-02-05 DIAGNOSIS — Z7901 Long term (current) use of anticoagulants: Secondary | ICD-10-CM

## 2016-02-05 DIAGNOSIS — Z79899 Other long term (current) drug therapy: Secondary | ICD-10-CM

## 2016-02-05 DIAGNOSIS — F0150 Vascular dementia without behavioral disturbance: Secondary | ICD-10-CM | POA: Diagnosis present

## 2016-02-05 DIAGNOSIS — Z87891 Personal history of nicotine dependence: Secondary | ICD-10-CM

## 2016-02-05 DIAGNOSIS — Z66 Do not resuscitate: Secondary | ICD-10-CM | POA: Diagnosis present

## 2016-02-05 DIAGNOSIS — Z953 Presence of xenogenic heart valve: Secondary | ICD-10-CM

## 2016-02-05 DIAGNOSIS — F329 Major depressive disorder, single episode, unspecified: Secondary | ICD-10-CM | POA: Diagnosis present

## 2016-02-05 DIAGNOSIS — F0151 Vascular dementia with behavioral disturbance: Secondary | ICD-10-CM | POA: Diagnosis present

## 2016-02-05 DIAGNOSIS — E785 Hyperlipidemia, unspecified: Secondary | ICD-10-CM | POA: Diagnosis present

## 2016-02-05 DIAGNOSIS — I495 Sick sinus syndrome: Secondary | ICD-10-CM

## 2016-02-05 DIAGNOSIS — I629 Nontraumatic intracranial hemorrhage, unspecified: Secondary | ICD-10-CM

## 2016-02-05 DIAGNOSIS — E0590 Thyrotoxicosis, unspecified without thyrotoxic crisis or storm: Secondary | ICD-10-CM | POA: Diagnosis present

## 2016-02-05 DIAGNOSIS — E119 Type 2 diabetes mellitus without complications: Secondary | ICD-10-CM | POA: Diagnosis present

## 2016-02-05 DIAGNOSIS — S06340A Traumatic hemorrhage of right cerebrum without loss of consciousness, initial encounter: Principal | ICD-10-CM | POA: Diagnosis present

## 2016-02-05 DIAGNOSIS — Z7982 Long term (current) use of aspirin: Secondary | ICD-10-CM

## 2016-02-05 DIAGNOSIS — Z952 Presence of prosthetic heart valve: Secondary | ICD-10-CM

## 2016-02-05 DIAGNOSIS — I2510 Atherosclerotic heart disease of native coronary artery without angina pectoris: Secondary | ICD-10-CM | POA: Diagnosis present

## 2016-02-05 DIAGNOSIS — W1839XA Other fall on same level, initial encounter: Secondary | ICD-10-CM | POA: Diagnosis present

## 2016-02-05 DIAGNOSIS — Z950 Presence of cardiac pacemaker: Secondary | ICD-10-CM

## 2016-02-05 DIAGNOSIS — Z794 Long term (current) use of insulin: Secondary | ICD-10-CM

## 2016-02-05 DIAGNOSIS — Z823 Family history of stroke: Secondary | ICD-10-CM

## 2016-02-05 DIAGNOSIS — I10 Essential (primary) hypertension: Secondary | ICD-10-CM | POA: Diagnosis present

## 2016-02-05 DIAGNOSIS — F419 Anxiety disorder, unspecified: Secondary | ICD-10-CM | POA: Diagnosis present

## 2016-02-05 DIAGNOSIS — E876 Hypokalemia: Secondary | ICD-10-CM | POA: Diagnosis present

## 2016-02-05 DIAGNOSIS — M1990 Unspecified osteoarthritis, unspecified site: Secondary | ICD-10-CM | POA: Diagnosis present

## 2016-02-05 DIAGNOSIS — Z7951 Long term (current) use of inhaled steroids: Secondary | ICD-10-CM

## 2016-02-05 DIAGNOSIS — G931 Anoxic brain damage, not elsewhere classified: Secondary | ICD-10-CM | POA: Diagnosis present

## 2016-02-05 DIAGNOSIS — W19XXXA Unspecified fall, initial encounter: Secondary | ICD-10-CM

## 2016-02-05 DIAGNOSIS — G4733 Obstructive sleep apnea (adult) (pediatric): Secondary | ICD-10-CM | POA: Diagnosis present

## 2016-02-05 DIAGNOSIS — I4892 Unspecified atrial flutter: Secondary | ICD-10-CM | POA: Diagnosis present

## 2016-02-05 DIAGNOSIS — M25551 Pain in right hip: Secondary | ICD-10-CM | POA: Diagnosis present

## 2016-02-05 DIAGNOSIS — Y92129 Unspecified place in nursing home as the place of occurrence of the external cause: Secondary | ICD-10-CM

## 2016-02-05 DIAGNOSIS — R109 Unspecified abdominal pain: Secondary | ICD-10-CM

## 2016-02-05 DIAGNOSIS — K219 Gastro-esophageal reflux disease without esophagitis: Secondary | ICD-10-CM | POA: Diagnosis present

## 2016-02-05 DIAGNOSIS — I6529 Occlusion and stenosis of unspecified carotid artery: Secondary | ICD-10-CM | POA: Diagnosis present

## 2016-02-05 DIAGNOSIS — Z951 Presence of aortocoronary bypass graft: Secondary | ICD-10-CM

## 2016-02-05 LAB — CBC WITH DIFFERENTIAL/PLATELET
BASOS ABS: 0.1 10*3/uL (ref 0.0–0.1)
Basophils Relative: 1 %
EOS PCT: 3 %
Eosinophils Absolute: 0.2 10*3/uL (ref 0.0–0.7)
HCT: 41 % (ref 39.0–52.0)
Hemoglobin: 13.3 g/dL (ref 13.0–17.0)
LYMPHS ABS: 1.7 10*3/uL (ref 0.7–4.0)
LYMPHS PCT: 26 %
MCH: 28.4 pg (ref 26.0–34.0)
MCHC: 32.4 g/dL (ref 30.0–36.0)
MCV: 87.4 fL (ref 78.0–100.0)
MONO ABS: 0.5 10*3/uL (ref 0.1–1.0)
Monocytes Relative: 7 %
Neutro Abs: 4.1 10*3/uL (ref 1.7–7.7)
Neutrophils Relative %: 63 %
PLATELETS: 336 10*3/uL (ref 150–400)
RBC: 4.69 MIL/uL (ref 4.22–5.81)
RDW: 14.5 % (ref 11.5–15.5)
WBC: 6.5 10*3/uL (ref 4.0–10.5)

## 2016-02-05 NOTE — ED Provider Notes (Signed)
[facility]Y DEPT Provider Note   CSN: [id number] Arrival date & time: 02/05/16  2304     History   Chief Complaint Chief Complaint  Patient presents with  . 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  The history is provided by the patient and medical records.  Fall   LEVEL V CAVEAT:  DEMENTIA 81 y.o. M with hx of AS, atrial flutter on coumadin, dementia, depression, DJD, HTN, HLP, OSA, DM2, spinal stenosis, presenting to the ED after a fall.  Patient is currently at [facility].  Staff there reports he was holding his walker up in the air, lost hit footing and fell.  He struck the right side of his head against the wall.  He was assisted to a chair by staff.Denies loss of consciousness, states he remembers the fall.  Patient was apparently somewhat confused with EMS, however is alert and oriented to his baseline on my assessment. He denies any headache or dizziness. No neck or back pain. States he feels at his baseline. Does have some mild pain in the right hip.  No numbness or weakness of the legs. Uses walker at baseline to assist with ambulation.  VSS.  Past Medical History:  Diagnosis Date  . Anxiety   . Aortic stenosis, moderate    moderate to severe per echo 03/2012 with AVR in March of 2014  . Atrial flutter [facility])    s/p CTI ablation by Dr Ladona Ridgel 1/12  . Atrial tachycardia (HCC)   . Bifascicular block   . CAD (coronary artery disease)    s/p CABG x 5 2003 by Dr Laneta Simmers  . Complete heart block (HCC) 08/21/15   [facility] PPM, Dr. Johney Frame  . DDD (degenerative disc disease)   . 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 Depression   . Diverticulosis   . DJD (degenerative joint disease)   . Hemorrhoids   . Hyperlipidemia   . Hypertension   . Nephrolithiasis   . OSA (obstructive sleep apnea)   . S/P AVR (aortic valve replacement)   . Spinal stenosis of lumbar region   . Type II or unspecified type diabetes mellitus without mention of  complication, not stated as uncontrolled     Patient Active Problem List   Diagnosis Date Noted  . Hyperthyroidism   . Altered mental status 10/28/2015  . Acute encephalopathy 10/28/2015  . Supratherapeutic INR 10/28/2015  . Fall 10/28/2015  . Mobitz type II atrioventricular block 08/21/2015  . Complete heart block (HCC) 08/21/2015  . Syncope 08/14/2015  . Vascular dementia 08/12/2015  . Memory loss 10/11/2014  . CAP (community acquired pneumonia) 08/28/2014  . OSA on CPAP 10/10/2013  . Hypersomnia with sleep apnea 10/10/2013  . Bifascicular block 08/03/2013  . Encounter for therapeutic drug monitoring 07/20/2013  . Insomnia, persistent 05/29/2013  . Unspecified sleep apnea 05/29/2013  . 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 OSA (obstructive sleep apnea)   . H/O mechanical aortic valve replacement 09/08/2012  . Long term (current) use of anticoagulants 09/08/2012  . S/P CABG x 5 08/19/2012  . Atrial tachycardia (HCC) 04/25/2012  . Fall from furniture 03/30/2011  . CAD (coronary artery disease) 09/30/2010  . Atrial flutter (HCC) 09/30/2010  . Aortic stenosis 09/30/2010  . Obese 09/30/2010  . Atrial fibrillation (HCC) 09/30/2010  . DYSPNEA 12/25/2007  . Diabetes mellitus (HCC) 11/16/2007  . HYPERLIPIDEMIA 11/16/2007  . Essential hypertension 11/16/2007  . COPD (chronic obstructive pulmonary disease) (HCC)  11/16/2007    Past Surgical History:  Procedure Laterality Date  . ANAL FISSURE REPAIR    . AORTIC VALVE REPLACEMENT N/A 09/01/2012   Procedure: AORTIC VALVE REPLACEMENT (AVR);  Surgeon: Alleen Borne, MD;  Location: [facility] OR;  Service: Open Heart Surgery;  Laterality: N/A;  . ATRIAL ABLATION SURGERY  1/12   atrial flutter ablation by Dr Ladona Ridgel  . BACK SURGERY  2010  . CABG  2003   by Dr Laneta Simmers  . CARDIOVERSION  04/07/2012   Procedure: CARDIOVERSION;  Surgeon: Wendall Stade, MD;  Location: [facility] ENDOSCOPY;  Service: Cardiovascular;  Laterality:  N/A;  . CORONARY ARTERY BYPASS GRAFT     10 yrs ago   03  . EP IMPLANTABLE DEVICE N/A 08/21/2015   [facility] PPM, Dr. Johney Frame  . INTRAOPERATIVE TRANSESOPHAGEAL ECHOCARDIOGRAM N/A 09/01/2012   Procedure: INTRAOPERATIVE TRANSESOPHAGEAL ECHOCARDIOGRAM;  Surgeon: Alleen Borne, MD;  Location: [facility] OR;  Service: Open Heart Surgery;  Laterality: N/A;  . LEG SURGERY     right  . TONSILLECTOMY    . TRIGGER FINGER RELEASE     bilat.       Home Medications    Prior to Admission medications   Medication Sig Start Date End Date Taking? Authorizing Provider  acetaminophen (TYLENOL) 500 MG tablet Take 2 tablets (1,000 mg total) by mouth 3 (three) times daily. 01/14/16   Camelia Phenes, DO  albuterol (PROVENTIL HFA;VENTOLIN HFA) 108 (90 BASE) MCG/ACT inhaler Inhale 2 puffs into the lungs every 6 (six) hours as needed for wheezing. 08/09/13   Barbaraann Share, MD  aspirin 81 MG tablet Take 81 mg by mouth daily.    Historical Provider, MD  budesonide-formoterol (SYMBICORT) 160-4.5 MCG/ACT inhaler Inhale 2 puffs into the lungs 2 (two) times daily. 03/08/14   Barbaraann Share, MD  cilostazol (PLETAL) 50 MG tablet Take 50 mg by mouth 2 (two) times daily.    Historical Provider, MD  finasteride (PROSCAR) 5 MG tablet Take 1 tablet (5 mg total) by mouth daily. 01/14/16   Jessica Ratliff Hoffman, DO  insulin glargine (LANTUS) 100 unit/mL SOPN Inject 20 Units into the skin at bedtime.    Historical Provider, MD  insulin lispro (HUMALOG KWIKPEN) 100 UNIT/ML KiwkPen Inject 0-12 Units into the skin 3 (three) times daily. 80-150 = 0 units 151-200 = 2 units 201-300 = 4 units 301-350 = 6 units 351-400 = 8 units >400 = 12 units and call MD    Historical Provider, MD  lidocaine (XYLOCAINE) 2 % jelly Apply 1 application topically 3 (three) times daily. 01/14/16   Jessica Ratliff Hoffman, DO  losartan (COZAAR) 100 MG tablet Take 1 tablet (100 mg total) by mouth daily. 08/14/15   [name], MD  memantine (NAMENDA) 10 MG  tablet Take 1 tablet (10 mg total) by mouth 2 (two) times daily. 04/11/15   Porfirio Mylar Dohmeier, MD  metoprolol tartrate (LOPRESSOR) 25 MG tablet Take 1 tablet (25 mg total) by mouth 2 (two) times daily. 09/05/15   [name], MD  Multiple Vitamins-Minerals (MULTIVITAMIN WITH MINERALS) tablet Take 1 tablet by mouth daily.    Historical Provider, MD  nitroGLYCERIN (NITROSTAT) 0.4 MG SL tablet Place 0.4 mg under the tongue every 5 (five) minutes as needed for chest pain.    Historical Provider, MD  OXYGEN Inhale 2 L into the lungs daily as needed (SHOB/CHEST PAIN).     Historical Provider, MD  ramelteon (ROZEREM) 8 MG tablet Take 1 tablet (8 mg total) by  mouth at bedtime. 01/14/16   Jessica Ratliff Hoffman, DO  senna-docusate (SENOKOT-S) 8.6-50 MG tablet Take 2 tablets by mouth 2 (two) times daily. Patient taking differently: Take 2 tablets by mouth 2 (two) times daily as needed for mild constipation.  11/02/15   Alison Murray, MD  Tamsulosin HCl (FLOMAX) 0.4 MG CAPS Take 0.4 mg by mouth at bedtime.  03/23/11   Historical Provider, MD  venlafaxine (EFFEXOR) 75 MG tablet Take 1 tablet (75 mg total) by mouth daily with supper. 11/02/15   Alison Murray, MD  warfarin (COUMADIN) 4 MG tablet Take 7 mg by mouth daily.     Historical Provider, MD    Family History Family History  Problem Relation Age of Onset  . Heart disease Father   . Stroke Mother   . Rheum arthritis Mother   . Heart disease Paternal Grandfather   . Colon cancer Neg Hx   . Stomach cancer Neg Hx     Social History Social History  Substance Use Topics  . Smoking status: Former Smoker    Packs/day: 1.00    Years: 49.00    Quit date: 08/21/2001  . Smokeless tobacco: Never Used     Comment: started at age 8.   [name] Alcohol use No     Comment: quit 15 yrs ago     Allergies   Doxycycline   Review of Systems Review of Systems  Unable to perform ROS: Dementia     Physical Exam Updated Vital Signs BP 185/88 (BP Location: Right  Arm)   Pulse 86   Temp 98.1 F (36.7 C) (Oral)   Resp 18   SpO2 96%   Physical Exam  Constitutional: He is oriented to person, place, and time. He appears well-developed and well-nourished.  Elderly, pleasant  HENT:  Head: Normocephalic and atraumatic.  Mouth/Throat: Oropharynx is clear and moist.  Small scalp hematoma noted to right parietal region, no open wounds or lacerations; midface stable, airway clear  Eyes: Conjunctivae and EOM are normal. Pupils are equal, round, and reactive to light.  Neck:  C-collar in place  Cardiovascular: Normal rate, regular rhythm and normal heart sounds.   Pulmonary/Chest: Effort normal and breath sounds normal.  Abdominal: Soft. Bowel sounds are normal.  Musculoskeletal: Normal range of motion.  Right hip is mildly tender to palpation without noted bony deformity, no leg shortening, moving for and all toes normally, foot is warm and well-perfused, DP pulse intact  Neurological: He is alert and oriented to person, place, and time.  AAOx3, answering questions and following commands appropriately; equal grips and LE strength bilaterally, no ataxia, speech clear and goal oriented  Skin: Skin is warm and dry.  Psychiatric: He has a normal mood and affect.  Nursing note and vitals reviewed.    ED Treatments / Results  Labs (all labs ordered are listed, but only abnormal results are displayed) Labs Reviewed  PROTIME-INR - Abnormal; Notable for the following:       Result Value   Prothrombin Time 36.5 (*)    All other components within normal limits  COMPREHENSIVE METABOLIC PANEL - Abnormal; Notable for the following:    Potassium 2.6 (*)    Glucose, Bld 148 (*)    Calcium 8.7 (*)    Albumin 3.3 (*)    ALT 14 (*)    All other components within normal limits  PROTIME-INR - Abnormal; Notable for the following:    Prothrombin Time 17.0 (*)    All other  components within normal limits  CBC WITH DIFFERENTIAL/PLATELET  URINALYSIS, ROUTINE W  REFLEX MICROSCOPIC (NOT AT [facility])  MAGNESIUM  PROTIME-INR  PROTIME-INR  PROTIME-INR    EKG  EKG Interpretation None       Radiology Ct Head Wo Contrast  Result Date: 02/06/2016 CLINICAL DATA:  Status post fall, hitting head on wall. Concern for cervical spine injury. Patient on Coumadin. Initial encounter. EXAM: CT HEAD WITHOUT CONTRAST CT CERVICAL SPINE WITHOUT CONTRAST TECHNIQUE: Multidetector CT imaging of the head and cervical spine was performed following the standard protocol without intravenous contrast. Multiplanar CT image reconstructions of the cervical spine were also generated. COMPARISON:  CT of the head performed 01/12/2016 FINDINGS: CT HEAD FINDINGS There is a 1.6 cm acute intraparenchymal bleed or contusion at the superior aspect of the right medial temporal lobe. No significant mass effect or midline shift is seen at this time. Prominence of the ventricles and sulci reflects moderate cortical volume loss. Scattered periventricular and subcortical white matter change likely reflects small vessel ischemic microangiopathy. The brainstem and fourth ventricle are within normal limits. The basal ganglia are unremarkable in appearance. The cerebral hemispheres demonstrate grossly normal gray-white differentiation. There is no evidence of fracture; visualized osseous structures are unremarkable in appearance. The orbits are within normal limits. The paranasal sinuses and mastoid air cells are well-aerated. No significant soft tissue abnormalities are seen. CT CERVICAL SPINE FINDINGS There is no evidence of fracture or subluxation. Vertebral bodies demonstrate normal height and alignment. Mild multilevel disc space narrowing is noted along the cervical spine, with scattered anterior and posterior disc osteophyte complexes, and underlying facet disease. Prevertebral soft tissues are within normal limits. A vague 2.5 cm hypodensity is noted at the left thyroid lobe. Mild emphysematous change  and scattered peripheral scarring are seen at the lung apices. Dense calcification is seen at the carotid bifurcations bilaterally, with likely severe bilateral luminal narrowing. The patient is status post median sternotomy. A pacemaker is noted at the left chest wall. IMPRESSION: 1. 1.6 cm acute intraparenchymal bleed or contusion at the superior aspect of the right medial temporal lobe. 2. No evidence of fracture or subluxation along the cervical spine. 3. Moderate cortical volume loss and scattered small vessel ischemic microangiopathy. 4. Mild degenerative change along the cervical spine. 5. Mild emphysematous change and scattered peripheral scarring at the lung apices. 6. Vague 2.5 cm hypodensity at the left thyroid lobe. Consider further evaluation with thyroid ultrasound. If patient is clinically hyperthyroid, consider nuclear medicine thyroid uptake and scan. 7. Dense calcification at the carotid bifurcations bilaterally, with likely severe bilateral luminal narrowing. Carotid ultrasound is recommended for further evaluation, when and as deemed clinically appropriate. Critical Value/emergent results were called by telephone at the time of interpretation on 02/06/2016 at 12:18 am to [facility] Mairlyn Tegtmeyer PA, who verbally acknowledged these results. Electronically Signed   By: Roanna RaiderJeffery  Chang M.D.   On: 02/06/2016 00:19   Ct Cervical Spine Wo Contrast  Result Date: 02/06/2016 CLINICAL DATA:  Status post fall, hitting head on wall. Concern for cervical spine injury. Patient on Coumadin. Initial encounter. EXAM: CT HEAD WITHOUT CONTRAST CT CERVICAL SPINE WITHOUT CONTRAST TECHNIQUE: Multidetector CT imaging of the head and cervical spine was performed following the standard protocol without intravenous contrast. Multiplanar CT image reconstructions of the cervical spine were also generated. COMPARISON:  CT of the head performed 01/12/2016 FINDINGS: CT HEAD FINDINGS There is a 1.6 cm acute intraparenchymal bleed or  contusion at the superior aspect of the right medial  temporal lobe. No significant mass effect or midline shift is seen at this time. Prominence of the ventricles and sulci reflects moderate cortical volume loss. Scattered periventricular and subcortical white matter change likely reflects small vessel ischemic microangiopathy. The brainstem and fourth ventricle are within normal limits. The basal ganglia are unremarkable in appearance. The cerebral hemispheres demonstrate grossly normal gray-white differentiation. There is no evidence of fracture; visualized osseous structures are unremarkable in appearance. The orbits are within normal limits. The paranasal sinuses and mastoid air cells are well-aerated. No significant soft tissue abnormalities are seen. CT CERVICAL SPINE FINDINGS There is no evidence of fracture or subluxation. Vertebral bodies demonstrate normal height and alignment. Mild multilevel disc space narrowing is noted along the cervical spine, with scattered anterior and posterior disc osteophyte complexes, and underlying facet disease. Prevertebral soft tissues are within normal limits. A vague 2.5 cm hypodensity is noted at the left thyroid lobe. Mild emphysematous change and scattered peripheral scarring are seen at the lung apices. Dense calcification is seen at the carotid bifurcations bilaterally, with likely severe bilateral luminal narrowing. The patient is status post median sternotomy. A pacemaker is noted at the left chest wall. IMPRESSION: 1. 1.6 cm acute intraparenchymal bleed or contusion at the superior aspect of the right medial temporal lobe. 2. No evidence of fracture or subluxation along the cervical spine. 3. Moderate cortical volume loss and scattered small vessel ischemic microangiopathy. 4. Mild degenerative change along the cervical spine. 5. Mild emphysematous change and scattered peripheral scarring at the lung apices. 6. Vague 2.5 cm hypodensity at the left thyroid lobe.  Consider further evaluation with thyroid ultrasound. If patient is clinically hyperthyroid, consider nuclear medicine thyroid uptake and scan. 7. Dense calcification at the carotid bifurcations bilaterally, with likely severe bilateral luminal narrowing. Carotid ultrasound is recommended for further evaluation, when and as deemed clinically appropriate. Critical Value/emergent results were called by telephone at the time of interpretation on 02/06/2016 at 12:18 am to [facility] PA, who verbally acknowledged these results. Electronically Signed   By: Roanna Raider M.D.   On: 02/06/2016 00:19   Dg Hip Unilat With Pelvis 2-3 Views Right  Result Date: 02/05/2016 CLINICAL DATA:  Fall.  Right hip pain. EXAM: DG HIP (WITH OR WITHOUT PELVIS) 2-3V RIGHT COMPARISON:  Right hip radiographs 08/05/2015 FINDINGS: The right hip is located. No acute bone or soft tissue abnormality is present. Pelvis is intact. Vascular calcifications are noted. IMPRESSION: 1. No acute abnormality. 2. Atherosclerosis. Electronically Signed   By: Marin Roberts M.D.   On: 02/05/2016 23:48    Procedures Procedures (including critical care time)  CRITICAL CARE Performed by: Garlon Hatchet   Total critical care time: 45 minutes  Critical care time was exclusive of separately billable procedures and treating other patients.  Critical care was necessary to treat or prevent imminent or life-threatening deterioration.  Critical care was time spent personally by me on the following activities: development of treatment plan with patient and/or surrogate as well as nursing, discussions with consultants, evaluation of patient's response to treatment, examination of patient, obtaining history from patient or surrogate, ordering and performing treatments and interventions, ordering and review of laboratory studies, ordering and review of radiographic studies, pulse oximetry and re-evaluation of patient's condition.   Medications  Ordered in ED Medications  dicyclomine (BENTYL) tablet 20 mg (not administered)  temazepam (RESTORIL) capsule 15 mg (not administered)  acetaminophen (TYLENOL) tablet 1,000 mg (not administered)  finasteride (PROSCAR) tablet 5 mg (not administered)  insulin glargine (LANTUS) injection 20 Units (not administered)  senna-docusate (Senokot-S) tablet 2 tablet (not administered)  metoprolol tartrate (LOPRESSOR) tablet 25 mg (not administered)  losartan (COZAAR) tablet 100 mg (not administered)  memantine (NAMENDA) tablet 10 mg (not administered)  cilostazol (PLETAL) tablet 50 mg (not administered)  mometasone-formoterol (DULERA) 200-5 MCG/ACT inhaler 2 puff (not administered)  tamsulosin (FLOMAX) capsule 0.4 mg (not administered)  acetaminophen (TYLENOL) tablet 650 mg (not administered)    Or  acetaminophen (TYLENOL) suppository 650 mg (not administered)  pantoprazole (PROTONIX) EC tablet 40 mg (not administered)  insulin aspart (novoLOG) injection 0-15 Units (not administered)  insulin aspart (novoLOG) injection 0-5 Units (not administered)  albuterol (PROVENTIL) (2.5 MG/3ML) 0.083% nebulizer solution 2.5 mg (not administered)  venlafaxine (EFFEXOR) tablet 75 mg (not administered)  prothrombin complex conc human (KCENTRA) IVPB 1,629 Units (1,629 Units Intravenous Given 02/06/16 0054)  phytonadione (VITAMIN K) 10 mg in dextrose 5 % 50 mL IVPB (0 mg Intravenous Stopped 02/06/16 0142)  potassium chloride 10 mEq in 100 mL IVPB (0 mEq Intravenous Stopped 02/06/16 0301)  LORazepam (ATIVAN) injection 0.5 mg (0.5 mg Intravenous Given 02/06/16 0140)     Initial Impression / Assessment and Plan / ED Course  I have reviewed the triage vital signs and the nursing notes.  Pertinent labs & imaging results that were available during my care of the patient were reviewed by me and considered in my medical decision making (see chart for details).  Clinical Course   81 y.o. M here after mechanical fall at  nursing home.  Struck right side of head on wall.  No LOC.  He is awake, alert, oriented to baseline on arrival to ED.  Small scalp hematoma noted to right parietal region.  No open wounds or lacerations.  He is on coumadin.  Head and cervical spine CT will be obtained.  Also has some right hip pain. No deformities or leg shortening noted on exam.  X-ray pending.  Labs also pending.  CT does reveal 1.6 cm intraparenchymal hemorrhage.  No acute neck or hip fractures.  Rapid reversal protocol started with Vit K and Kcentra.  Discuss with neurosurgery, Dr. Bevely Palmeritty-- no operative management currently, will monitor closely.  He will see patient in consult.  Patient has active DNR, POA (Lanette) in room now, this will be upheld.  Patient's [place]  I have called and spoken with her and updated about patient's status, she understands and will be here soon.  Patient is also hypokalemic at 2.6. This will be replaced IV.  Patient admitted to hospitalist service for ongoing care.  Temp admit orders placed.  Final Clinical Impressions(s) / ED Diagnoses   Final diagnoses:  Fall  Intracranial hemorrhage (HCC)  Hypokalemia  Chronic anticoagulation    New Prescriptions New Prescriptions   No medications on file     Garlon HatchetLisa M Twanna Resh, PA-C 02/06/16 [id number]    Shon Batonourtney F Horton, MD 02/10/16 [phone]

## 2016-02-05 NOTE — ED Triage Notes (Signed)
[facility] called after pt fell. States he was not holding his walker correctly and fell, hitting his head on the wall. No LOC. Alert to self and place. At baseline. VSS by EMS. c-collar placed by EMS. Takes coumadin. C/o right hip pain en route.

## 2016-02-06 ENCOUNTER — Encounter (HOSPITAL_COMMUNITY): Payer: Self-pay | Admitting: Internal Medicine

## 2016-02-06 DIAGNOSIS — G4733 Obstructive sleep apnea (adult) (pediatric): Secondary | ICD-10-CM | POA: Diagnosis present

## 2016-02-06 DIAGNOSIS — E119 Type 2 diabetes mellitus without complications: Secondary | ICD-10-CM | POA: Diagnosis present

## 2016-02-06 DIAGNOSIS — E0590 Thyrotoxicosis, unspecified without thyrotoxic crisis or storm: Secondary | ICD-10-CM | POA: Diagnosis present

## 2016-02-06 DIAGNOSIS — I480 Paroxysmal atrial fibrillation: Secondary | ICD-10-CM | POA: Diagnosis present

## 2016-02-06 DIAGNOSIS — Z823 Family history of stroke: Secondary | ICD-10-CM | POA: Diagnosis not present

## 2016-02-06 DIAGNOSIS — Z8249 Family history of ischemic heart disease and other diseases of the circulatory system: Secondary | ICD-10-CM | POA: Diagnosis not present

## 2016-02-06 DIAGNOSIS — E876 Hypokalemia: Secondary | ICD-10-CM | POA: Diagnosis present

## 2016-02-06 DIAGNOSIS — I2510 Atherosclerotic heart disease of native coronary artery without angina pectoris: Secondary | ICD-10-CM | POA: Diagnosis present

## 2016-02-06 DIAGNOSIS — I629 Nontraumatic intracranial hemorrhage, unspecified: Secondary | ICD-10-CM

## 2016-02-06 DIAGNOSIS — Z951 Presence of aortocoronary bypass graft: Secondary | ICD-10-CM | POA: Diagnosis not present

## 2016-02-06 DIAGNOSIS — Z881 Allergy status to other antibiotic agents status: Secondary | ICD-10-CM | POA: Diagnosis not present

## 2016-02-06 DIAGNOSIS — S06340A Traumatic hemorrhage of right cerebrum without loss of consciousness, initial encounter: Secondary | ICD-10-CM | POA: Diagnosis present

## 2016-02-06 DIAGNOSIS — Z950 Presence of cardiac pacemaker: Secondary | ICD-10-CM | POA: Diagnosis not present

## 2016-02-06 DIAGNOSIS — Z66 Do not resuscitate: Secondary | ICD-10-CM | POA: Diagnosis present

## 2016-02-06 DIAGNOSIS — Z794 Long term (current) use of insulin: Secondary | ICD-10-CM | POA: Diagnosis not present

## 2016-02-06 DIAGNOSIS — G931 Anoxic brain damage, not elsewhere classified: Secondary | ICD-10-CM | POA: Diagnosis present

## 2016-02-06 DIAGNOSIS — I495 Sick sinus syndrome: Secondary | ICD-10-CM

## 2016-02-06 DIAGNOSIS — Z79899 Other long term (current) drug therapy: Secondary | ICD-10-CM | POA: Diagnosis not present

## 2016-02-06 DIAGNOSIS — Y92129 Unspecified place in nursing home as the place of occurrence of the external cause: Secondary | ICD-10-CM | POA: Diagnosis not present

## 2016-02-06 DIAGNOSIS — I4892 Unspecified atrial flutter: Secondary | ICD-10-CM | POA: Diagnosis present

## 2016-02-06 DIAGNOSIS — E785 Hyperlipidemia, unspecified: Secondary | ICD-10-CM | POA: Diagnosis present

## 2016-02-06 DIAGNOSIS — Z7982 Long term (current) use of aspirin: Secondary | ICD-10-CM | POA: Diagnosis not present

## 2016-02-06 DIAGNOSIS — W1839XA Other fall on same level, initial encounter: Secondary | ICD-10-CM | POA: Diagnosis present

## 2016-02-06 DIAGNOSIS — Z7901 Long term (current) use of anticoagulants: Secondary | ICD-10-CM | POA: Diagnosis not present

## 2016-02-06 DIAGNOSIS — Z7951 Long term (current) use of inhaled steroids: Secondary | ICD-10-CM | POA: Diagnosis not present

## 2016-02-06 DIAGNOSIS — I10 Essential (primary) hypertension: Secondary | ICD-10-CM | POA: Diagnosis not present

## 2016-02-06 DIAGNOSIS — I6529 Occlusion and stenosis of unspecified carotid artery: Secondary | ICD-10-CM | POA: Diagnosis present

## 2016-02-06 DIAGNOSIS — S0003XA Contusion of scalp, initial encounter: Secondary | ICD-10-CM | POA: Diagnosis present

## 2016-02-06 DIAGNOSIS — Z87891 Personal history of nicotine dependence: Secondary | ICD-10-CM | POA: Diagnosis not present

## 2016-02-06 DIAGNOSIS — F0151 Vascular dementia with behavioral disturbance: Secondary | ICD-10-CM | POA: Diagnosis not present

## 2016-02-06 LAB — URINALYSIS, ROUTINE W REFLEX MICROSCOPIC
BILIRUBIN URINE: NEGATIVE
Bilirubin Urine: NEGATIVE
Glucose, UA: NEGATIVE mg/dL
Glucose, UA: NEGATIVE mg/dL
Hgb urine dipstick: NEGATIVE
Hgb urine dipstick: NEGATIVE
KETONES UR: NEGATIVE mg/dL
Ketones, ur: NEGATIVE mg/dL
LEUKOCYTES UA: NEGATIVE
Leukocytes, UA: NEGATIVE
NITRITE: NEGATIVE
NITRITE: NEGATIVE
PH: 7 (ref 5.0–8.0)
PROTEIN: NEGATIVE mg/dL
Protein, ur: NEGATIVE mg/dL
SPECIFIC GRAVITY, URINE: 1.014 (ref 1.005–1.030)
Specific Gravity, Urine: 1.017 (ref 1.005–1.030)
pH: 6 (ref 5.0–8.0)

## 2016-02-06 LAB — PROTIME-INR
INR: 1.37
INR: 3.56
PROTHROMBIN TIME: 36.5 s — AB (ref 11.4–15.2)
Prothrombin Time: 17 seconds — ABNORMAL HIGH (ref 11.4–15.2)

## 2016-02-06 LAB — COMPREHENSIVE METABOLIC PANEL
ALK PHOS: 68 U/L (ref 38–126)
ALT: 14 U/L — ABNORMAL LOW (ref 17–63)
ANION GAP: 5 (ref 5–15)
AST: 19 U/L (ref 15–41)
Albumin: 3.3 g/dL — ABNORMAL LOW (ref 3.5–5.0)
BILIRUBIN TOTAL: 0.6 mg/dL (ref 0.3–1.2)
BUN: 10 mg/dL (ref 6–20)
CALCIUM: 8.7 mg/dL — AB (ref 8.9–10.3)
CO2: 31 mmol/L (ref 22–32)
Chloride: 103 mmol/L (ref 101–111)
Creatinine, Ser: 0.73 mg/dL (ref 0.61–1.24)
GFR calc non Af Amer: >60 mL/min (ref >60)
Glucose, Bld: 148 mg/dL — ABNORMAL HIGH (ref 65–99)
Potassium: 2.6 mmol/L — CL (ref 3.5–5.1)
SODIUM: 139 mmol/L (ref 135–145)
TOTAL PROTEIN: 6.5 g/dL (ref 6.5–8.1)

## 2016-02-06 LAB — GLUCOSE, CAPILLARY
GLUCOSE-CAPILLARY: 138 mg/dL — AB (ref 65–99)
Glucose-Capillary: 181 mg/dL — ABNORMAL HIGH (ref 65–99)
Glucose-Capillary: 127 mg/dL — ABNORMAL HIGH (ref 65–99)

## 2016-02-06 LAB — BASIC METABOLIC PANEL
ANION GAP: 12 (ref 5–15)
BUN: 6 mg/dL (ref 6–20)
CHLORIDE: 105 mmol/L (ref 101–111)
CO2: 24 mmol/L (ref 22–32)
CREATININE: 0.67 mg/dL (ref 0.61–1.24)
Calcium: 8.8 mg/dL — ABNORMAL LOW (ref 8.9–10.3)
GFR calc non Af Amer: >60 mL/min (ref >60)
GLUCOSE: 159 mg/dL — AB (ref 65–99)
Potassium: 2.9 mmol/L — ABNORMAL LOW (ref 3.5–5.1)
SODIUM: 141 mmol/L (ref 135–145)

## 2016-02-06 LAB — MAGNESIUM: MAGNESIUM: 1.8 mg/dL (ref 1.7–2.4)

## 2016-02-06 LAB — MRSA PCR SCREENING: MRSA BY PCR: NEGATIVE

## 2016-02-06 MED ORDER — INSULIN LISPRO 100 UNIT/ML (KWIKPEN): 0.0000 [IU] | PEN_INJECTOR | Freq: Three times a day (TID) | SUBCUTANEOUS | Status: DC

## 2016-02-06 MED ORDER — STROKE: EARLY STAGES OF RECOVERY BOOK
Freq: Once | Status: DC
  Filled 2016-02-06: qty 1

## 2016-02-06 MED ORDER — HALOPERIDOL LACTATE 5 MG/ML IJ SOLN
2.0000 mg | Freq: Once | INTRAMUSCULAR | Status: AC
  Administered 2016-02-06: 2 mg via INTRAVENOUS
  Filled 2016-02-06: qty 1

## 2016-02-06 MED ORDER — PROTHROMBIN COMPLEX CONC HUMAN 500 UNITS IV KIT
1629.0000 [IU] | PACK | INTRAVENOUS | Status: AC
  Administered 2016-02-06: 1629 [IU] via INTRAVENOUS
  Filled 2016-02-06: qty 65

## 2016-02-06 MED ORDER — INSULIN ASPART 100 UNIT/ML ~~LOC~~ SOLN
0.0000 [IU] | Freq: Three times a day (TID) | SUBCUTANEOUS | Status: DC
  Administered 2016-02-06: 2 [IU] via SUBCUTANEOUS
  Administered 2016-02-06: 3 [IU] via SUBCUTANEOUS
  Administered 2016-02-07 – 2016-02-10 (×3): 2 [IU] via SUBCUTANEOUS

## 2016-02-06 MED ORDER — TAMSULOSIN HCL 0.4 MG PO CAPS
0.4000 mg | ORAL_CAPSULE | Freq: Every day | ORAL | Status: DC
  Administered 2016-02-06 – 2016-02-09 (×4): 0.4 mg via ORAL
  Filled 2016-02-06 (×4): qty 1

## 2016-02-06 MED ORDER — CILOSTAZOL 50 MG PO TABS
50.0000 mg | ORAL_TABLET | Freq: Two times a day (BID) | ORAL | Status: DC
  Administered 2016-02-06 – 2016-02-10 (×9): 50 mg via ORAL
  Filled 2016-02-06 (×11): qty 1

## 2016-02-06 MED ORDER — METOPROLOL TARTRATE 50 MG PO TABS
50.0000 mg | ORAL_TABLET | Freq: Two times a day (BID) | ORAL | Status: DC
  Administered 2016-02-06 – 2016-02-10 (×7): 50 mg via ORAL
  Filled 2016-02-06 (×8): qty 1

## 2016-02-06 MED ORDER — TEMAZEPAM 15 MG PO CAPS
15.0000 mg | ORAL_CAPSULE | Freq: Every day | ORAL | Status: DC
  Administered 2016-02-06 – 2016-02-09 (×4): 15 mg via ORAL
  Filled 2016-02-06 (×4): qty 1

## 2016-02-06 MED ORDER — LORAZEPAM 2 MG/ML IJ SOLN
0.5000 mg | Freq: Once | INTRAMUSCULAR | Status: AC
  Administered 2016-02-06: 0.5 mg via INTRAVENOUS
  Filled 2016-02-06: qty 1

## 2016-02-06 MED ORDER — PANTOPRAZOLE SODIUM 40 MG PO TBEC
40.0000 mg | DELAYED_RELEASE_TABLET | Freq: Every day | ORAL | Status: DC
  Administered 2016-02-06 – 2016-02-10 (×5): 40 mg via ORAL
  Filled 2016-02-06 (×5): qty 1

## 2016-02-06 MED ORDER — VITAMIN K1 10 MG/ML IJ SOLN
10.0000 mg | INTRAMUSCULAR | Status: AC
  Administered 2016-02-06: 10 mg via INTRAVENOUS
  Filled 2016-02-06: qty 1

## 2016-02-06 MED ORDER — POTASSIUM CHLORIDE 10 MEQ/100ML IV SOLN
10.0000 meq | INTRAVENOUS | Status: AC
  Administered 2016-02-06 (×2): 10 meq via INTRAVENOUS
  Filled 2016-02-06 (×2): qty 100

## 2016-02-06 MED ORDER — MORPHINE SULFATE (PF) 2 MG/ML IV SOLN
2.0000 mg | INTRAVENOUS | Status: DC | PRN
  Administered 2016-02-06 – 2016-02-10 (×9): 2 mg via INTRAVENOUS
  Filled 2016-02-06 (×8): qty 1

## 2016-02-06 MED ORDER — MEMANTINE HCL 10 MG PO TABS
10.0000 mg | ORAL_TABLET | Freq: Two times a day (BID) | ORAL | Status: DC
  Administered 2016-02-06 – 2016-02-10 (×9): 10 mg via ORAL
  Filled 2016-02-06 (×11): qty 1

## 2016-02-06 MED ORDER — ALBUTEROL SULFATE (2.5 MG/3ML) 0.083% IN NEBU: 2.5000 mg | INHALATION_SOLUTION | RESPIRATORY_TRACT | Status: DC | PRN

## 2016-02-06 MED ORDER — VENLAFAXINE HCL 75 MG PO TABS
75.0000 mg | ORAL_TABLET | Freq: Every day | ORAL | Status: DC
  Administered 2016-02-06 – 2016-02-09 (×4): 75 mg via ORAL
  Filled 2016-02-06: qty 1
  Filled 2016-02-06: qty 2
  Filled 2016-02-06 (×3): qty 1

## 2016-02-06 MED ORDER — FINASTERIDE 5 MG PO TABS
5.0000 mg | ORAL_TABLET | Freq: Every day | ORAL | Status: DC
  Administered 2016-02-06 – 2016-02-10 (×5): 5 mg via ORAL
  Filled 2016-02-06 (×6): qty 1

## 2016-02-06 MED ORDER — ACETAMINOPHEN 325 MG PO TABS: 650.0000 mg | ORAL_TABLET | ORAL | Status: DC | PRN

## 2016-02-06 MED ORDER — DICYCLOMINE HCL 20 MG PO TABS
20.0000 mg | ORAL_TABLET | Freq: Four times a day (QID) | ORAL | Status: DC
  Administered 2016-02-06 – 2016-02-10 (×14): 20 mg via ORAL
  Filled 2016-02-06 (×20): qty 1

## 2016-02-06 MED ORDER — LOSARTAN POTASSIUM 50 MG PO TABS
100.0000 mg | ORAL_TABLET | Freq: Every day | ORAL | Status: DC
Start: 2016-02-06 — End: 2016-02-10
  Administered 2016-02-06 – 2016-02-10 (×5): 100 mg via ORAL
  Filled 2016-02-06 (×5): qty 2

## 2016-02-06 MED ORDER — INSULIN ASPART 100 UNIT/ML ~~LOC~~ SOLN
0.0000 [IU] | Freq: Every day | SUBCUTANEOUS | Status: DC
  Administered 2016-02-09: 2 [IU] via SUBCUTANEOUS

## 2016-02-06 MED ORDER — OXYCODONE HCL 5 MG PO TABS
5.0000 mg | ORAL_TABLET | Freq: Four times a day (QID) | ORAL | Status: DC | PRN
  Administered 2016-02-06 – 2016-02-07 (×2): 5 mg via ORAL
  Filled 2016-02-06 (×2): qty 1

## 2016-02-06 MED ORDER — METOPROLOL TARTRATE 25 MG PO TABS
25.0000 mg | ORAL_TABLET | Freq: Two times a day (BID) | ORAL | Status: DC
  Administered 2016-02-06: 25 mg via ORAL
  Filled 2016-02-06: qty 1

## 2016-02-06 MED ORDER — MOMETASONE FURO-FORMOTEROL FUM 200-5 MCG/ACT IN AERO
2.0000 | INHALATION_SPRAY | Freq: Two times a day (BID) | RESPIRATORY_TRACT | Status: DC
  Administered 2016-02-09 – 2016-02-10 (×2): 2 via RESPIRATORY_TRACT
  Filled 2016-02-06 (×2): qty 8.8

## 2016-02-06 MED ORDER — INSULIN GLARGINE 100 UNIT/ML ~~LOC~~ SOLN
20.0000 [IU] | Freq: Every day | SUBCUTANEOUS | Status: DC
  Administered 2016-02-06 – 2016-02-09 (×4): 20 [IU] via SUBCUTANEOUS
  Filled 2016-02-06 (×7): qty 0.2

## 2016-02-06 MED ORDER — SENNOSIDES-DOCUSATE SODIUM 8.6-50 MG PO TABS
2.0000 | ORAL_TABLET | Freq: Two times a day (BID) | ORAL | Status: DC | PRN
  Filled 2016-02-06: qty 2

## 2016-02-06 MED ORDER — MORPHINE SULFATE (PF) 2 MG/ML IV SOLN
2.0000 mg | INTRAVENOUS | Status: DC | PRN
  Administered 2016-02-06: 2 mg via INTRAVENOUS
  Filled 2016-02-06 (×2): qty 1

## 2016-02-06 MED ORDER — ACETAMINOPHEN 500 MG PO TABS
1000.0000 mg | ORAL_TABLET | Freq: Three times a day (TID) | ORAL | Status: DC
  Administered 2016-02-06 – 2016-02-10 (×13): 1000 mg via ORAL
  Filled 2016-02-06 (×14): qty 2

## 2016-02-06 MED ORDER — POTASSIUM CHLORIDE CRYS ER 20 MEQ PO TBCR
40.0000 meq | EXTENDED_RELEASE_TABLET | Freq: Four times a day (QID) | ORAL | Status: AC
  Administered 2016-02-06 (×2): 40 meq via ORAL
  Filled 2016-02-06 (×2): qty 2

## 2016-02-06 MED ORDER — QUETIAPINE FUMARATE 25 MG PO TABS
25.0000 mg | ORAL_TABLET | Freq: Two times a day (BID) | ORAL | Status: DC
  Administered 2016-02-06 – 2016-02-10 (×8): 25 mg via ORAL
  Filled 2016-02-06 (×8): qty 1

## 2016-02-06 MED ORDER — HALOPERIDOL LACTATE 5 MG/ML IJ SOLN: 2.0000 mg | Freq: Four times a day (QID) | INTRAMUSCULAR | Status: DC | PRN

## 2016-02-06 MED ORDER — ACETAMINOPHEN 650 MG RE SUPP: 650.0000 mg | RECTAL | Status: DC | PRN

## 2016-02-06 NOTE — ED Notes (Signed)
This RN attempted to do swallow screen on pt. Pt asleep will try when pt wakes up.

## 2016-02-06 NOTE — ED Notes (Signed)
When rounding pt.was soaking wet with urine. Cleaned pt.up . Change linen placed clean chuxs .clean blankets

## 2016-02-06 NOTE — Evaluation (Signed)
Physical Therapy Evaluation Patient Details Name: Adam PasseCharles D Delamar, [name] MRN: [id number] DOB: [birth year] Today's Date: 02/06/2016   History of Present Illness  81 y.o. male s/p fall at SNF on 8/17. CT revealed ICH of R medial temporal lobe. PMHx: pt with complex cardiac hx, CAD, CABG, s/p AVR, DDD, DM II, lumbar spinal stenosis, OSA, HTN, HLD, anxiety, Depression, Dementia.  Clinical Impression  Patient demonstrates deficits in functional mobility as indicated below. Will need continued skilled PT to address deficits and maixmize function. Will see as indicated and progress as tolerated. Spoke with daughter at length regarding PLOF and current deficits. Feel patient needs further rehabilitation to address deficits as noted and she agrees. At this time recommend SNF for rehabilitation.    Follow Up Recommendations SNF;Supervision/Assistance - 24 hour    Equipment Recommendations  None recommended by PT    Recommendations for Other Services       Precautions / Restrictions Precautions Precautions: Fall Restrictions Weight Bearing Restrictions: No      Mobility  Bed Mobility Overal bed mobility: Needs Assistance Bed Mobility: Supine to Sit;Sit to Supine     Supine to sit: Min assist Sit to supine: Min assist   General bed mobility comments: Mod assist to initiate movemet to EOB. Assist for controlled descent from sit to supine. Cueing required throughout for initiation and participation.  Transfers Overall transfer level: Needs assistance Equipment used: 2 person hand held assist Transfers: Sit to/from Stand Sit to Stand: Mod assist;+2 physical assistance         General transfer comment: Assist to boost up from EOB and for balance in standing.  Ambulation/Gait Ambulation/Gait assistance: Mod assist;+2 physical assistance Ambulation Distance (Feet): 12 Feet Assistive device: 2 person hand held assist Gait Pattern/deviations: Step-to pattern;Decreased stride  length;Shuffle;Narrow base of support Gait velocity: decreased Gait velocity interpretation: <1.8 ft/sec, indicative of risk for recurrent falls General Gait Details: max cues for activity performance, significant instability and deviations in gait. Increased assist. Use of distraction to complete task  Stairs            Wheelchair Mobility    Modified Rankin (Stroke Patients Only) Modified Rankin (Stroke Patients Only) Pre-Morbid Rankin Score: No symptoms Modified Rankin: Moderately severe disability     Balance Overall balance assessment: Needs assistance;History of Falls   Sitting balance-Leahy Scale: Fair     Standing balance support: Bilateral upper extremity supported Standing balance-Leahy Scale: Poor                               Pertinent Vitals/Pain Pain Assessment: Faces Faces Pain Scale: Hurts even more Pain Location: catheter site Pain Descriptors / Indicators: Guarding;Grimacing Pain Intervention(s): Monitored during session    Home Living Family/patient expects to be discharged to:: Skilled nursing facility                 Additional Comments: Resident at SNF.    Prior Function Level of Independence: Needs assistance   Gait / Transfers Assistance Needed: Walking with rollator (shuffled gait)  ADL's / Homemaking Assistance Needed: Setup and min assist for dressing. Supervision for bathing.        Hand Dominance   Dominant Hand: Right    Extremity/Trunk Assessment   Upper Extremity Assessment: Generalized weakness           Lower Extremity Assessment: Generalized weakness;Difficult to assess due to impaired cognition      Cervical / Trunk Assessment: Kyphotic  Communication   Communication: No difficulties  Cognition Arousal/Alertness: Awake/alert Behavior During Therapy: Flat affect Overall Cognitive Status: Impaired/Different from baseline Area of Impairment: Orientation;Attention;Memory;Following  commands;Safety/judgement;Awareness;Problem solving Orientation Level: Disoriented to;Time;Situation Current Attention Level: Focused Memory: Decreased short-term memory Following Commands: Follows one step commands inconsistently Safety/Judgement: Decreased awareness of safety;Decreased awareness of deficits Awareness: Intellectual Problem Solving: Slow processing;Decreased initiation;Difficulty sequencing;Requires verbal cues;Requires tactile cues General Comments: Disoriented to year PTA, poor insight, awareness, and judgement. Daughter reports cognition is worse now than at baseline.    General Comments      Exercises        Assessment/Plan    PT Assessment Patient needs continued PT services  PT Diagnosis Difficulty walking;Abnormality of gait;Generalized weakness;Altered mental status   PT Problem List Decreased strength;Decreased range of motion;Decreased activity tolerance;Decreased balance;Decreased mobility;Decreased coordination;Decreased cognition;Decreased safety awareness;Pain  PT Treatment Interventions DME instruction;Gait training;Functional mobility training;Therapeutic activities;Therapeutic exercise;Balance training;Cognitive remediation;Patient/family education   PT Goals (Current goals can be found in the Care Plan section) Acute Rehab PT Goals Patient Stated Goal: get back to bed PT Goal Formulation: With patient Time For Goal Achievement: 02/20/16 Potential to Achieve Goals: Good    Frequency Min 2X/week   Barriers to discharge        Co-evaluation PT/OT/SLP Co-Evaluation/Treatment: Yes Reason for Co-Treatment: Complexity of the patient's impairments (multi-system involvement);Necessary to address cognition/behavior during functional activity;For patient/therapist safety PT goals addressed during session: Mobility/safety with mobility OT goals addressed during session: Other (comment) (functional mobility)       End of Session Equipment Utilized  During Treatment: Gait belt Activity Tolerance: Treatment limited secondary to agitation Patient left: in bed;with call bell/phone within reach;with family/visitor present Nurse Communication: Mobility status         Time: 1500-1529 PT Time Calculation (min) (ACUTE ONLY): 29 min   Charges:   PT Evaluation $PT Eval Moderate Complexity: 1 Procedure     PT G CodesFabio Asa:        Yaquelin Langelier J 02/06/2016, 6:15 PM Charlotte Crumbevon Kambree Krauss, PT DPT  [phone]

## 2016-02-06 NOTE — H&P (Signed)
History and Physical    Adam ORTON, [name] WJX:[id number] DOB: 1934-11-30 DOA: 02/05/2016  PCP: Leroy Kennedy- at SNF Consultants:  Allred - cardiology;Groat - ophthalmology Patient coming from: [name]  Chief Complaint: fall  HPI: Adam Passe, [name] is a 81 y.o. male with medical history significant of aortic valve replacement (porcine) on Coumadin, dementia, back pain presenting to the ER after a fall at [facility].  Patient's wife provided the history and reports that she was called by the SNF with notification that he was being brought to the ER. She was with him earlier in the evening and he received Ativan and was lethargic tonight. He usually manages fine so this was unusual.  He was in bed asleep when wife left him. He evidently got up to go to the bathroom, picked up his walker and lost balance and hit his head on the wall. He did land in a chair but did not hit the floor.  Has occipital headache. No vision changes.  No injuries since he fell.   ED Course: Per Adam [name]: 81 y.o. M here after mechanical fall at nursing home.  Struck right side of head on wall.  No LOC.  He is awake, alert, oriented to baseline on arrival to ED.  Small scalp hematoma noted to right parietal region.  No open wounds or lacerations.  He is on coumadin.  Head and cervical spine CT will be obtained.  Also has some right hip pain. No deformities or leg shortening noted on exam.  X-ray pending.  Labs also pending.  CT does reveal 1.6 cm intraparenchymal hemorrhage.  No acute neck or hip fractures.  Rapid reversal protocol started with Vit K and Kcentra.  Discuss with neurosurgery, Dr. Bevely Palmer-- no operative management currently, will monitor closely.  He will see patient in consult.  Patient has active DNR, POA (Lanette) in room now, this will be upheld.  Patient's daughter Adam [name] is on her way here from [place], [place].  I have called and spoken with her and updated about patient's status, she understands and will be  here soon.  Patient is also hypokalemic at 2.6. This will be replaced IV.  Patient admitted to hospitalist service for ongoing care.  Temp admit orders placed.  Review of Systems: As per HPI; otherwise 10 point review of systems reviewed and negative.   Ambulatory Status:   Usually ambulates with a walker  Past Medical History:  Diagnosis Date  . Anxiety   . Aortic stenosis, moderate    moderate to severe per echo 03/2012 with AVR in March of 2014  . Atrial flutter [facility])    s/p CTI ablation by Dr Ladona Ridgel 1/12  . Atrial tachycardia (HCC)   . Bifascicular block   . CAD (coronary artery disease)    s/p CABG x 5 2003 by Dr Laneta Simmers  . Complete heart block (HCC) 08/21/15   [facility] PPM, Dr. Johney Frame  . DDD (degenerative disc disease)   . 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 Depression   . Diverticulosis   . DJD (degenerative joint disease)   . Hemorrhoids   . Hyperlipidemia   . Hypertension   . Nephrolithiasis   . OSA (obstructive sleep apnea)   . S/P AVR (aortic valve replacement)   . Spinal stenosis of lumbar region   . Type II or unspecified type diabetes mellitus without mention of complication, not stated as uncontrolled     Past Surgical History:  Procedure Laterality Date  .  ANAL FISSURE REPAIR    . AORTIC VALVE REPLACEMENT N/A 09/01/2012   Procedure: AORTIC VALVE REPLACEMENT (AVR);  Surgeon: Alleen BorneBryan K Bartle, MD;  [facility];  Service: Open Heart Surgery;  Laterality: N/A;  . ATRIAL ABLATION SURGERY  1/12   atrial flutter ablation by Dr Ladona Ridgelaylor  . BACK SURGERY  2010  . CABG  2003   by Dr Laneta SimmersBartle  . CARDIOVERSION  04/07/2012   Procedure: CARDIOVERSION;  Surgeon: Wendall StadePeter C Nishan, MD;  [facility];  Service: Cardiovascular;  Laterality: N/A;  . CORONARY ARTERY BYPASS GRAFT     10 yrs ago   03  . EP IMPLANTABLE DEVICE N/A 08/21/2015   [facility] PPM, Dr. Johney FrameAllred  . INTRAOPERATIVE TRANSESOPHAGEAL ECHOCARDIOGRAM N/A 09/01/2012   Procedure: INTRAOPERATIVE  TRANSESOPHAGEAL ECHOCARDIOGRAM;  Surgeon: Alleen BorneBryan K Bartle, MD;  [facility];  Service: Open Heart Surgery;  Laterality: N/A;  . LEG SURGERY     right  . TONSILLECTOMY    . TRIGGER FINGER RELEASE     bilat.    Social History   Social History  . Marital status: Married    Spouse name: Adam [name]  . Number of children: 5  . Years of education: college   Occupational History  . pharmacist Unemployed    retired   Social History Main Topics  . Smoking status: Former Smoker    Packs/day: 1.00    Years: 49.00    Quit date: 08/21/2001  . Smokeless tobacco: Never Used     Comment: started at age 81.   [name]. Alcohol use No     Comment: quit 15 yrs ago  . Drug use: No  . Sexual activity: No   Other Topics Concern  . Not on file   Social History Narrative   Patient is married Market researcher(Adam [name]) and lives at home with his wife.   Patient has two children and his wife has three children.   Patient has a college education.   Patient is right- handed.   Patient does not drink any caffeine.    He is a retired Teacher, early years/prepharmacist.    Allergies  Allergen Reactions  . Doxycycline Swelling    swollen tongue     Family History  Problem Relation Age of Onset  . Heart disease Father   . Stroke Mother   . Rheum arthritis Mother   . Heart disease Paternal Grandfather   . Colon cancer Neg Hx   . Stomach cancer Neg Hx     Prior to Admission medications   Medication Sig Start Date End Date Taking? Authorizing Provider  acetaminophen (TYLENOL) 500 MG tablet Take 2 tablets (1,000 mg total) by mouth 3 (three) times daily. 01/14/16   Camelia PhenesJessica Ratliff Hoffman, DO  albuterol (PROVENTIL HFA;VENTOLIN HFA) 108 (90 BASE) MCG/ACT inhaler Inhale 2 puffs into the lungs every 6 (six) hours as needed for wheezing. 08/09/13   Barbaraann ShareKeith M Clance, MD  aspirin 81 MG tablet Take 81 mg by mouth daily.    Historical Provider, MD  budesonide-formoterol (SYMBICORT) 160-4.5 MCG/ACT inhaler Inhale 2 puffs into the lungs 2 (two) times  daily. 03/08/14   Barbaraann ShareKeith M Clance, MD  cilostazol (PLETAL) 50 MG tablet Take 50 mg by mouth 2 (two) times daily.    Historical Provider, MD  finasteride (PROSCAR) 5 MG tablet Take 1 tablet (5 mg total) by mouth daily. 01/14/16   Jessica Ratliff Hoffman, DO  insulin glargine (LANTUS) 100 unit/mL SOPN Inject 20 Units into the skin at bedtime.  Historical Provider, MD  insulin lispro (HUMALOG KWIKPEN) 100 UNIT/ML KiwkPen Inject 0-12 Units into the skin 3 (three) times daily. 80-150 = 0 units 151-200 = 2 units 201-300 = 4 units 301-350 = 6 units 351-400 = 8 units >400 = 12 units and call MD    Historical Provider, MD  lidocaine (XYLOCAINE) 2 % jelly Apply 1 application topically 3 (three) times daily. 01/14/16   Jessica Ratliff Hoffman, DO  losartan (COZAAR) 100 MG tablet Take 1 tablet (100 mg total) by mouth daily. 08/14/15   [name], MD  memantine (NAMENDA) 10 MG tablet Take 1 tablet (10 mg total) by mouth 2 (two) times daily. 04/11/15   Porfirio Mylar Dohmeier, MD  metoprolol tartrate (LOPRESSOR) 25 MG tablet Take 1 tablet (25 mg total) by mouth 2 (two) times daily. 09/05/15   [name], MD  Multiple Vitamins-Minerals (MULTIVITAMIN WITH MINERALS) tablet Take 1 tablet by mouth daily.    Historical Provider, MD  nitroGLYCERIN (NITROSTAT) 0.4 MG SL tablet Place 0.4 mg under the tongue every 5 (five) minutes as needed for chest pain.    Historical Provider, MD  OXYGEN Inhale 2 L into the lungs daily as needed (SHOB/CHEST PAIN).     Historical Provider, MD  ramelteon (ROZEREM) 8 MG tablet Take 1 tablet (8 mg total) by mouth at bedtime. 01/14/16   Jessica Ratliff Hoffman, DO  senna-docusate (SENOKOT-S) 8.6-50 MG tablet Take 2 tablets by mouth 2 (two) times daily. Patient taking differently: Take 2 tablets by mouth 2 (two) times daily as needed for mild constipation.  11/02/15   Alison Murray, MD  Tamsulosin HCl (FLOMAX) 0.4 MG CAPS Take 0.4 mg by mouth at bedtime.  03/23/11   Historical Provider, MD    venlafaxine (EFFEXOR) 75 MG tablet Take 1 tablet (75 mg total) by mouth daily with supper. 11/02/15   Alison Murray, MD  warfarin (COUMADIN) 4 MG tablet Take 7 mg by mouth daily.     Historical Provider, MD    Physical Exam: Vitals:   02/06/16 0530 02/06/16 0605 02/06/16 0640 02/06/16 0700  BP: 158/89 (!) 172/106 (!) 163/88 (!) 188/86  Pulse: 108 (!) 121 (!) 122 (!) 131  Resp: 22 22 (!) 23 (!) 22  Temp:  98.3 F (36.8 C)    TempSrc:  Oral    SpO2: 93% 92% 96% 92%  Weight:         General: Appears agitated and disoriented but is redirectable; is not wearing pants or underwear and refuses to keep his sheet on Eyes:  PERRL, EOMI, normal lids, iris ENT:  grossly normal hearing, lips & tongue, mmm Neck:  no LAD, masses or thyromegaly Cardiovascular:  RRR, no m/r/g. No LE edema.  Respiratory:  CTA bilaterally, no w/r/r. Normal respiratory effort. 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) and various medications but obviously with dementia and behavioral disturbance that makes him difficult to reorient at times Neurologic: CN 2-12 grossly intact, moves all extremities in coordinated fashion, sensation intact  Labs on Admission: I have personally reviewed following labs and imaging studies  CBC:  Recent Labs Lab 02/05/16 2332  WBC 6.5  NEUTROABS 4.1  HGB 13.3  HCT 41.0  MCV 87.4  PLT 336   Basic Metabolic Panel:  Recent Labs Lab 02/05/16 2332  NA 139  K 2.6*  CL 103  CO2 31  GLUCOSE 148*  BUN 10  CREATININE 0.73  CALCIUM 8.7*  MG 1.8   GFR: Estimated Creatinine Clearance: 67.6 mL/min (by C-G formula based on SCr of 0.8 mg/dL). Liver Function Tests:  Recent Labs Lab 02/05/16 2332  AST 19  ALT 14*  ALKPHOS 68  BILITOT 0.6  PROT 6.5  ALBUMIN 3.3*   No  results for input(s): LIPASE, AMYLASE in the last 168 hours. No results for input(s): AMMONIA in the last 168 hours. Coagulation Profile:  Recent Labs Lab 02/05/16 2332 02/06/16 0218  INR 3.56 1.37   Cardiac Enzymes: No results for input(s): CKTOTAL, CKMB, CKMBINDEX, TROPONINI in the last 168 hours. BNP (last 3 results) No results for input(s): PROBNP in the last 8760 hours. HbA1C: No results for input(s): HGBA1C in the last 72 hours. CBG: No results for input(s): GLUCAP in the last 168 hours. Lipid Profile: No results for input(s): CHOL, HDL, LDLCALC, TRIG, CHOLHDL, LDLDIRECT in the last 72 hours. Thyroid Function Tests: No results for input(s): TSH, T4TOTAL, FREET4, T3FREE, THYROIDAB in the last 72 hours. Anemia Panel: No results for input(s): VITAMINB12, FOLATE, FERRITIN, TIBC, IRON, RETICCTPCT in the last 72 hours. Urine analysis:    Component Value Date/Time   COLORURINE YELLOW 02/06/2016 0051   APPEARANCEUR CLEAR 02/06/2016 0051   LABSPEC 1.017 02/06/2016 0051   PHURINE 6.0 02/06/2016 0051   GLUCOSEU NEGATIVE 02/06/2016 0051   HGBUR NEGATIVE 02/06/2016 0051   BILIRUBINUR NEGATIVE 02/06/2016 0051   KETONESUR NEGATIVE 02/06/2016 0051   PROTEINUR NEGATIVE 02/06/2016 0051   UROBILINOGEN 1.0 11/27/2013 1310   NITRITE NEGATIVE 02/06/2016 0051   LEUKOCYTESUR NEGATIVE 02/06/2016 0051    Creatinine Clearance: Estimated Creatinine Clearance: 67.6 mL/min (by C-G formula based on SCr of 0.8 mg/dL).  Sepsis Labs: @LABRCNTIP (procalcitonin:4,lacticidven:4) )No results found for this or any previous visit (from the past 240 hour(s)).   Radiological Exams on Admission: Ct Head Wo Contrast  Result Date: 02/06/2016 CLINICAL DATA:  Status post fall, hitting head on wall. Concern for cervical spine injury. Patient on Coumadin. Initial encounter. EXAM: CT HEAD WITHOUT CONTRAST CT CERVICAL SPINE WITHOUT CONTRAST TECHNIQUE: Multidetector CT imaging of the head and cervical spine was  performed following the standard protocol without intravenous contrast. Multiplanar CT image reconstructions of the cervical spine were also generated. COMPARISON:  CT of the head performed 01/12/2016 FINDINGS: CT HEAD FINDINGS There is a 1.6 cm acute intraparenchymal bleed or contusion at the superior aspect of the right medial temporal lobe. No significant mass effect or midline shift is seen at this time. Prominence of the ventricles and sulci reflects moderate cortical volume loss. Scattered periventricular and subcortical white matter change likely reflects small vessel ischemic microangiopathy. The brainstem and fourth ventricle are within normal limits. The basal ganglia are unremarkable in appearance. The cerebral hemispheres demonstrate grossly normal gray-white differentiation. There is no evidence of fracture; visualized osseous structures are unremarkable in appearance. The orbits are within normal limits. The paranasal sinuses and mastoid air cells are well-aerated. No significant soft tissue abnormalities are seen. CT CERVICAL SPINE FINDINGS There is no evidence of fracture or subluxation. Vertebral bodies demonstrate normal height and alignment. Mild multilevel disc space narrowing is noted along the cervical spine, with scattered anterior and posterior disc osteophyte complexes, and underlying facet disease. Prevertebral soft tissues are within normal limits. A vague 2.5 cm hypodensity is noted at the left thyroid lobe. Mild emphysematous change and scattered peripheral scarring are seen at the  lung apices. Dense calcification is seen at the carotid bifurcations bilaterally, with likely severe bilateral luminal narrowing. The patient is status post median sternotomy. A pacemaker is noted at the left chest wall. IMPRESSION: 1. 1.6 cm acute intraparenchymal bleed or contusion at the superior aspect of the right medial temporal lobe. 2. No evidence of fracture or subluxation along the cervical spine. 3.  Moderate cortical volume loss and scattered small vessel ischemic microangiopathy. 4. Mild degenerative change along the cervical spine. 5. Mild emphysematous change and scattered peripheral scarring at the lung apices. 6. Vague 2.5 cm hypodensity at the left thyroid lobe. Consider further evaluation with thyroid ultrasound. If patient is clinically hyperthyroid, consider nuclear medicine thyroid uptake and scan. 7. Dense calcification at the carotid bifurcations bilaterally, with likely severe bilateral luminal narrowing. Carotid ultrasound is recommended for further evaluation, when and as deemed clinically appropriate. Critical Value/emergent results were called by telephone at the time of interpretation on 02/06/2016 at 12:18 am to [facility] PA, who verbally acknowledged these results. Electronically Signed   By: Roanna RaiderJeffery  Chang M.D.   On: 02/06/2016 00:19   Ct Cervical Spine Wo Contrast  Result Date: 02/06/2016 CLINICAL DATA:  Status post fall, hitting head on wall. Concern for cervical spine injury. Patient on Coumadin. Initial encounter. EXAM: CT HEAD WITHOUT CONTRAST CT CERVICAL SPINE WITHOUT CONTRAST TECHNIQUE: Multidetector CT imaging of the head and cervical spine was performed following the standard protocol without intravenous contrast. Multiplanar CT image reconstructions of the cervical spine were also generated. COMPARISON:  CT of the head performed 01/12/2016 FINDINGS: CT HEAD FINDINGS There is a 1.6 cm acute intraparenchymal bleed or contusion at the superior aspect of the right medial temporal lobe. No significant mass effect or midline shift is seen at this time. Prominence of the ventricles and sulci reflects moderate cortical volume loss. Scattered periventricular and subcortical white matter change likely reflects small vessel ischemic microangiopathy. The brainstem and fourth ventricle are within normal limits. The basal ganglia are unremarkable in appearance. The cerebral hemispheres  demonstrate grossly normal gray-white differentiation. There is no evidence of fracture; visualized osseous structures are unremarkable in appearance. The orbits are within normal limits. The paranasal sinuses and mastoid air cells are well-aerated. No significant soft tissue abnormalities are seen. CT CERVICAL SPINE FINDINGS There is no evidence of fracture or subluxation. Vertebral bodies demonstrate normal height and alignment. Mild multilevel disc space narrowing is noted along the cervical spine, with scattered anterior and posterior disc osteophyte complexes, and underlying facet disease. Prevertebral soft tissues are within normal limits. A vague 2.5 cm hypodensity is noted at the left thyroid lobe. Mild emphysematous change and scattered peripheral scarring are seen at the lung apices. Dense calcification is seen at the carotid bifurcations bilaterally, with likely severe bilateral luminal narrowing. The patient is status post median sternotomy. A pacemaker is noted at the left chest wall. IMPRESSION: 1. 1.6 cm acute intraparenchymal bleed or contusion at the superior aspect of the right medial temporal lobe. 2. No evidence of fracture or subluxation along the cervical spine. 3. Moderate cortical volume loss and scattered small vessel ischemic microangiopathy. 4. Mild degenerative change along the cervical spine. 5. Mild emphysematous change and scattered peripheral scarring at the lung apices. 6. Vague 2.5 cm hypodensity at the left thyroid lobe. Consider further evaluation with thyroid ultrasound. If patient is clinically hyperthyroid, consider nuclear medicine thyroid uptake and scan. 7. Dense calcification at the carotid bifurcations bilaterally, with likely severe bilateral luminal narrowing. Carotid ultrasound is  recommended for further evaluation, when and as deemed clinically appropriate. Critical Value/emergent results were called by telephone at the time of interpretation on 02/06/2016 at 12:18 am  to [facility] PA, who verbally acknowledged these results. Electronically Signed   By: Roanna Raider M.D.   On: 02/06/2016 00:19   Dg Hip Unilat With Pelvis 2-3 Views Right  Result Date: 02/05/2016 CLINICAL DATA:  Fall.  Right hip pain. EXAM: DG HIP (WITH OR WITHOUT PELVIS) 2-3V RIGHT COMPARISON:  Right hip radiographs 08/05/2015 FINDINGS: The right hip is located. No acute bone or soft tissue abnormality is present. Pelvis is intact. Vascular calcifications are noted. IMPRESSION: 1. No acute abnormality. 2. Atherosclerosis. Electronically Signed   By: Marin Roberts M.D.   On: 02/05/2016 23:48    EKG: Independently reviewed.  Ventricular paced with rate 89  Assessment/Plan Principal Problem:   Intracranial hemorrhage (HCC) Active Problems:   Diabetes mellitus (HCC)   Essential hypertension   S/P CABG x 5   H/O mechanical aortic valve replacement   Vascular dementia   Hypokalemia   Carotid stenosis   Intracranial hemorrhage -Patient discussed with Dr. Bevely Palmer from neurosurgery and he requests that [facility] admit patient to neuro SDU for ongoing monitoring -Will maintain BPs with goal 160-180 to ensure ongoing perfusion -Patient on anticoagulation and this was reversed as per protocol with vitamin K and K-centra -Dr. Bevely Palmer requests [phone] neuro checks -INR is already down from 3.56 to 1.37 since presentation  H/o mechanical valve replacement -Would suggest cardiology consult - will send inbox message to the [employer] requesting that Dr. Johney Frame by notified of admission -Based on fall with intracranial hemorrhage, it seems unlikely that patient will ever be able to be anticoagulated again  Dementia -On Namenda -He was quite agitated and difficult to reorient throughout our long ER visit -His wife does a great job with him, but he does seem to exhibit significant behavioral disturbance -He takes Ativan with some sedation at SNF and this can be helpful and so it was  given in the ER to try to help the patient relax -Neuro/BH consult may be beneficial while the patient is inpatient -He takes a number of other medications including Rozerem, Restoril and Effexor as well; Effexor was continued and so was Restoril  DM -Continue Lantus, SSI  HTN -Continue Cozaar, Lopressor for goal BP 160-180  CAD/carotid stenosis -Abnormal carotid findings on CT in patient with known CAD -Would need to discuss treatment goals with his wife -It may be reasonable to pare down his entire medication list and not attempt to pursue carotid evaluation, but she would need to be in agreement with this plan; if she were, palliative care consultation would be beneficial -Cardiology consult requested   DVT prophylaxis: SCDs Code Status: DNR - confirmed with patient/family Family Communication: Wife at bedside throughout evaluation Disposition Plan: Back to [name] once clinically improved (SW consult requested) Consults called: Neurosurgery, SW, Cardiology  Admission status: Admit to Neuro SDU   Jonah Blue MD [facility]  If 7PM-7AM, please contact night-coverage [email or web address] Password [facility]  02/06/2016, 7:28 AM

## 2016-02-06 NOTE — Consult Note (Addendum)
CARDIOLOGY CONSULT NOTE       Patient ID: Retia PasseCharles D Mccullars, [name] MRN: [id number] DOB/AGE: [birth year] 81 y.o.  Admit date: 02/05/2016 Referring Physician:  Vanessa BarbaraZamora Primary Physician: Garlan FillersPATERSON,DANIEL G, MD Primary Cardiologist:  Cooper/ Allred Reason for Consultation: PAF, Anticoagulation  Principal Problem:   Intracranial hemorrhage (HCC) Active Problems:   Diabetes mellitus (HCC)   Essential hypertension   S/P CABG x 5   H/O mechanical aortic valve replacement   Vascular dementia   Hypokalemia   Carotid stenosis   HPI:  81 y.o. history of CAD with CABG 2003.  Subsequent tissue AVR in 2014 for aortic stenosis. PPM by Dr Johney FrameAllred for syncope and AV block March 2014.  History of PAF on chronic coumadin. With previous flutter ablation by Dr Ladona Ridgelaylor  Admitted from SNF with mechanical fall and CNS bleed.  1.6 cm acute intraparenchymal bleed or contusion superior aspect of right medial temporal lobe.  His coumadin was reversed using Vit K And Kcentra.  He has had increasing delirium with dementia and falls. Neurosurgery note indicated That he could resume anticoagulation in 3 days and no intervention on their part needed BP has been up and tachycardic Review of his telemetry and ECG shows Sinus tachycardia with LBBB or ST with biV pacing .      ROS All other systems reviewed and negative except as noted above  Past Medical History:  Diagnosis Date  . Anxiety   . Aortic stenosis, moderate    moderate to severe per echo 03/2012 with AVR in March of 2014  . Atrial flutter [facility](HCC)    s/p CTI ablation by Dr Ladona Ridgelaylor 1/12  . Atrial tachycardia (HCC)   . Bifascicular block   . CAD (coronary artery disease)    s/p CABG x 5 2003 by Dr Laneta SimmersBartle  . Complete heart block (HCC) 08/21/15   STJ PPM, Dr. Johney FrameAllred  . DDD (degenerative disc disease)   . 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 Depression   . Diverticulosis   . DJD (degenerative joint disease)   . Hemorrhoids   .  Hyperlipidemia   . Hypertension   . Nephrolithiasis   . OSA (obstructive sleep apnea)   . S/P AVR (aortic valve replacement)   . Spinal stenosis of lumbar region   . Type II or unspecified type diabetes mellitus without mention of complication, not stated as uncontrolled     Family History  Problem Relation Age of Onset  . Heart disease Father   . Stroke Mother   . Rheum arthritis Mother   . Heart disease Paternal Grandfather   . Colon cancer Neg Hx   . Stomach cancer Neg Hx     Social History   Social History  . Marital status: Married    Spouse name: Lynnette  . Number of children: 5  . Years of education: college   Occupational History  . pharmacist Unemployed    retired   Social History Main Topics  . Smoking status: Former Smoker    Packs/day: 1.00    Years: 49.00    Quit date: 08/21/2001  . Smokeless tobacco: Never Used     Comment: started at age 81.   [name]. Alcohol use No     Comment: quit 15 yrs ago  . Drug use: No  . Sexual activity: No   Other Topics Concern  . Not on file   Social History Narrative   Patient is married Market researcher(Lynnette) and lives at home with his wife.  Patient has two children and his wife has three children.   Patient has a college education.   Patient is right- handed.   Patient does not drink any caffeine.    He is a retired Teacher, early years/pre.    Past Surgical History:  Procedure Laterality Date  . ANAL FISSURE REPAIR    . AORTIC VALVE REPLACEMENT N/A 09/01/2012   Procedure: AORTIC VALVE REPLACEMENT (AVR);  Surgeon: Alleen Borne, MD;  Location: [facility] OR;  Service: Open Heart Surgery;  Laterality: N/A;  . ATRIAL ABLATION SURGERY  1/12   atrial flutter ablation by Dr Ladona Ridgel  . BACK SURGERY  2010  . CABG  2003   by Dr Laneta Simmers  . CARDIOVERSION  04/07/2012   Procedure: CARDIOVERSION;  Surgeon: Wendall Stade, MD;  Location: [facility] ENDOSCOPY;  Service: Cardiovascular;  Laterality: N/A;  . CORONARY ARTERY BYPASS GRAFT     10 yrs ago   03  . EP  IMPLANTABLE DEVICE N/A 08/21/2015   STJ PPM, Dr. Johney Frame  . INTRAOPERATIVE TRANSESOPHAGEAL ECHOCARDIOGRAM N/A 09/01/2012   Procedure: INTRAOPERATIVE TRANSESOPHAGEAL ECHOCARDIOGRAM;  Surgeon: Alleen Borne, MD;  Location: [facility] OR;  Service: Open Heart Surgery;  Laterality: N/A;  . LEG SURGERY     right  . TONSILLECTOMY    . TRIGGER FINGER RELEASE     bilat.     [name] acetaminophen  1,000 mg Oral TID  . cilostazol  50 mg Oral BID  . dicyclomine  20 mg Oral Q6H  . finasteride  5 mg Oral Daily  . insulin aspart  0-15 Units Subcutaneous TID WC  . insulin aspart  0-5 Units Subcutaneous QHS  . insulin glargine  20 Units Subcutaneous QHS  . losartan  100 mg Oral Daily  . memantine  10 mg Oral BID  . metoprolol tartrate  25 mg Oral BID  . mometasone-formoterol  2 puff Inhalation BID  . pantoprazole  40 mg Oral Daily  . potassium chloride  40 mEq Oral Q6H  . tamsulosin  0.4 mg Oral QHS  . temazepam  15 mg Oral QHS  . venlafaxine  75 mg Oral Q supper      Physical Exam: Blood pressure 132/71, pulse 92, temperature 98.2 F (36.8 C), temperature source Oral, resp. rate (!) 23, height 5\' 6"  (1.676 m), weight 152 lb 5.4 oz (69.1 kg), SpO2 96 %. Marland Kitchenp   Dementia with some acting out  Healthy:  appears stated age HEENT: normal Neck supple with no adenopathy JVP normal no bruits no thyromegaly Lungs clear with no wheezing and good diaphragmatic motion Heart:  S1/S2 SEM murmur, no rub, gallop or click Pacer under left clavicle  PMI normal Abdomen: benighn, BS positve, no tenderness, no AAA no bruit.  No HSM or HJR Distal pulses intact with no bruits No edema Neuro non-focal Skin warm and dry No muscular weakness  Labs:   Lab Results  Component Value Date   WBC 6.5 02/05/2016   HGB 13.3 02/05/2016   HCT 41.0 02/05/2016   MCV 87.4 02/05/2016   PLT 336 02/05/2016    Recent Labs Lab 02/05/16 2332 02/06/16 1047  NA 139 141  K 2.6* 2.9*  CL 103 105  CO2 31 24  BUN 10 6  CREATININE  0.73 0.67  CALCIUM 8.7* 8.8*  PROT 6.5  --   BILITOT 0.6  --   ALKPHOS 68  --   ALT 14*  --   AST 19  --   GLUCOSE 148* 159*  Lab Results  Component Value Date   CKTOTAL 175 07/03/2010   CKMB (HH) 07/03/2010    8.7 CRITICAL VALUE NOTED.  VALUE IS CONSISTENT WITH PREVIOUSLY REPORTED AND CALLED VALUE.   TROPONINI 0.03        NO INDICATION OF MYOCARDIAL INJURY. 07/03/2010    Lab Results  Component Value Date   CHOL 135 10/29/2015   Lab Results  Component Value Date   HDL 31 (L) 10/29/2015   Lab Results  Component Value Date   LDLCALC 87 10/29/2015   Lab Results  Component Value Date   TRIG 87 10/29/2015   Lab Results  Component Value Date   CHOLHDL 4.4 10/29/2015   No results found for: LDLDIRECT    Radiology: Dg Chest 2 View  Result Date: 01/12/2016 CLINICAL DATA:  Altered mental status. Cough and right-sided abdominal pain. EXAM: CHEST  2 VIEW COMPARISON:  12/29/2015 chest radiographs and CT FINDINGS: Sequelae of prior CABG in cardiac valve replacement are again identified. Pacemaker remains in place. Cardiac silhouette remains mildly enlarged. Lung volumes are unchanged with similar appearance of reticular opacities at both lung bases. No definite acute consolidation, pleural effusion, or pneumothorax is identified. No acute osseous abnormality is identified. IMPRESSION: Unchanged appearance of the chest without evidence of acute abnormality. Cardiomegaly and chronic interstitial lung disease. Electronically Signed   By: Sebastian Ache M.D.   On: 01/12/2016 01:48  Ct Head Wo Contrast  Result Date: 02/06/2016 CLINICAL DATA:  Status post fall, hitting head on wall. Concern for cervical spine injury. Patient on Coumadin. Initial encounter. EXAM: CT HEAD WITHOUT CONTRAST CT CERVICAL SPINE WITHOUT CONTRAST TECHNIQUE: Multidetector CT imaging of the head and cervical spine was performed following the standard protocol without intravenous contrast. Multiplanar CT image  reconstructions of the cervical spine were also generated. COMPARISON:  CT of the head performed 01/12/2016 FINDINGS: CT HEAD FINDINGS There is a 1.6 cm acute intraparenchymal bleed or contusion at the superior aspect of the right medial temporal lobe. No significant mass effect or midline shift is seen at this time. Prominence of the ventricles and sulci reflects moderate cortical volume loss. Scattered periventricular and subcortical white matter change likely reflects small vessel ischemic microangiopathy. The brainstem and fourth ventricle are within normal limits. The basal ganglia are unremarkable in appearance. The cerebral hemispheres demonstrate grossly normal gray-white differentiation. There is no evidence of fracture; visualized osseous structures are unremarkable in appearance. The orbits are within normal limits. The paranasal sinuses and mastoid air cells are well-aerated. No significant soft tissue abnormalities are seen. CT CERVICAL SPINE FINDINGS There is no evidence of fracture or subluxation. Vertebral bodies demonstrate normal height and alignment. Mild multilevel disc space narrowing is noted along the cervical spine, with scattered anterior and posterior disc osteophyte complexes, and underlying facet disease. Prevertebral soft tissues are within normal limits. A vague 2.5 cm hypodensity is noted at the left thyroid lobe. Mild emphysematous change and scattered peripheral scarring are seen at the lung apices. Dense calcification is seen at the carotid bifurcations bilaterally, with likely severe bilateral luminal narrowing. The patient is status post median sternotomy. A pacemaker is noted at the left chest wall. IMPRESSION: 1. 1.6 cm acute intraparenchymal bleed or contusion at the superior aspect of the right medial temporal lobe. 2. No evidence of fracture or subluxation along the cervical spine. 3. Moderate cortical volume loss and scattered small vessel ischemic microangiopathy. 4. Mild  degenerative change along the cervical spine. 5. Mild emphysematous change and scattered peripheral  scarring at the lung apices. 6. Vague 2.5 cm hypodensity at the left thyroid lobe. Consider further evaluation with thyroid ultrasound. If patient is clinically hyperthyroid, consider nuclear medicine thyroid uptake and scan. 7. Dense calcification at the carotid bifurcations bilaterally, with likely severe bilateral luminal narrowing. Carotid ultrasound is recommended for further evaluation, when and as deemed clinically appropriate. Critical Value/emergent results were called by telephone at the time of interpretation on 02/06/2016 at 12:18 am to [facility], who verbally acknowledged these results. Electronically Signed   By: Roanna Raider M.D.   On: 02/06/2016 00:19   Ct Head Wo Contrast  Result Date: 01/12/2016 CLINICAL DATA:  Altered and agitated. Lethargic earlier today. History of dementia. EXAM: CT HEAD WITHOUT CONTRAST TECHNIQUE: Contiguous axial images were obtained from the base of the skull through the vertex without intravenous contrast. COMPARISON:  Head CT dated 10/31/2015. FINDINGS: Brain: There is generalized age related parenchymal atrophy with commensurate dilatation of the ventricles and sulci. The ventriculomegaly is stable. Chronic small vessel ischemic changes again noted within the bilateral periventricular and subcortical white matter regions. There is no mass, hemorrhage, edema or other evidence of acute parenchymal abnormality. No extra-axial hemorrhage. Vascular: No hyperdense vessel or unexpected calcification. There are chronic calcified atherosclerotic changes of the large vessels at the skull base. Skull: Negative for fracture or focal lesion. Sinuses/Orbits: No acute findings. Other: None. IMPRESSION: No acute findings. Stable head CT. No intracranial mass, hemorrhage or edema. Electronically Signed   By: Bary Richard M.D.   On: 01/12/2016 08:24  Ct Cervical Spine Wo  Contrast  Result Date: 02/06/2016 CLINICAL DATA:  Status post fall, hitting head on wall. Concern for cervical spine injury. Patient on Coumadin. Initial encounter. EXAM: CT HEAD WITHOUT CONTRAST CT CERVICAL SPINE WITHOUT CONTRAST TECHNIQUE: Multidetector CT imaging of the head and cervical spine was performed following the standard protocol without intravenous contrast. Multiplanar CT image reconstructions of the cervical spine were also generated. COMPARISON:  CT of the head performed 01/12/2016 FINDINGS: CT HEAD FINDINGS There is a 1.6 cm acute intraparenchymal bleed or contusion at the superior aspect of the right medial temporal lobe. No significant mass effect or midline shift is seen at this time. Prominence of the ventricles and sulci reflects moderate cortical volume loss. Scattered periventricular and subcortical white matter change likely reflects small vessel ischemic microangiopathy. The brainstem and fourth ventricle are within normal limits. The basal ganglia are unremarkable in appearance. The cerebral hemispheres demonstrate grossly normal gray-white differentiation. There is no evidence of fracture; visualized osseous structures are unremarkable in appearance. The orbits are within normal limits. The paranasal sinuses and mastoid air cells are well-aerated. No significant soft tissue abnormalities are seen. CT CERVICAL SPINE FINDINGS There is no evidence of fracture or subluxation. Vertebral bodies demonstrate normal height and alignment. Mild multilevel disc space narrowing is noted along the cervical spine, with scattered anterior and posterior disc osteophyte complexes, and underlying facet disease. Prevertebral soft tissues are within normal limits. A vague 2.5 cm hypodensity is noted at the left thyroid lobe. Mild emphysematous change and scattered peripheral scarring are seen at the lung apices. Dense calcification is seen at the carotid bifurcations bilaterally, with likely severe  bilateral luminal narrowing. The patient is status post median sternotomy. A pacemaker is noted at the left chest wall. IMPRESSION: 1. 1.6 cm acute intraparenchymal bleed or contusion at the superior aspect of the right medial temporal lobe. 2. No evidence of fracture or subluxation along the cervical spine. 3.  Moderate cortical volume loss and scattered small vessel ischemic microangiopathy. 4. Mild degenerative change along the cervical spine. 5. Mild emphysematous change and scattered peripheral scarring at the lung apices. 6. Vague 2.5 cm hypodensity at the left thyroid lobe. Consider further evaluation with thyroid ultrasound. If patient is clinically hyperthyroid, consider nuclear medicine thyroid uptake and scan. 7. Dense calcification at the carotid bifurcations bilaterally, with likely severe bilateral luminal narrowing. Carotid ultrasound is recommended for further evaluation, when and as deemed clinically appropriate. Critical Value/emergent results were called by telephone at the time of interpretation on 02/06/2016 at 12:18 am to [facility], who verbally acknowledged these results. Electronically Signed   By: Roanna Raider M.D.   On: 02/06/2016 00:19   Ct Abdomen Pelvis W Contrast  Result Date: 01/12/2016 CLINICAL DATA:  Lethargy.  Lower abdominal pain. EXAM: CT ABDOMEN AND PELVIS WITH CONTRAST TECHNIQUE: Multidetector CT imaging of the abdomen and pelvis was performed using the standard protocol following bolus administration of intravenous contrast. CONTRAST:  ISOVUE-300 IOPAMIDOL (ISOVUE-300) INJECTION 61% COMPARISON:  Abdomen CTs dated 12/29/2015 and 02/25/2011. FINDINGS: Lower chest:  Fibrosis and atelectasis at each lung base. Hepatobiliary: Liver and gallbladder appear normal. Pancreas: Pancreas appears normal. Spleen: Within normal limits in size and appearance. Adrenals/Urinary Tract: Adrenal glands appear normal. Stable left renal cyst. Probable tiny right renal cyst.  Calcifications within each renal hilum are favored to be vascular in nature. No obstructing renal or ureteral calculi. Prostate gland is heterogeneously enlarged causing mass effect on the bladder base. Bladder is otherwise unremarkable. Stomach/Bowel: Bowel is normal in caliber. Scattered mild diverticulosis within the sigmoid colon without evidence of acute diverticulitis. Appendix is normal. Stomach appears normal. Vascular/Lymphatic: Heavy atherosclerotic changes of the normal caliber abdominal aorta, aortic branch vessels and pelvic vasculature. No enlarged lymph nodes seen within the abdomen or pelvis. Reproductive: Heterogeneous/nodular enlargement of the prostate gland, as above. Other: No free fluid or abscess collections seen. No free intraperitoneal air. Musculoskeletal: Degenerative changes of the lumbar spine, moderate in degree. Minimally displaced fracture of the left lateral eighth rib, of uncertain age. Superficial soft tissues are unremarkable. IMPRESSION: 1. No acute findings within the abdomen or pelvis. 2. Heterogeneous/nodular enlargement of the prostate gland, causing mass effect on the bladder base. No associated bladder distension. 3. Colonic diverticulosis without evidence of acute diverticulitis. 4. Aortic atherosclerosis. 5. Additional chronic/incidental findings detailed above. Electronically Signed   By: Bary Richard M.D.   On: 01/12/2016 07:54  Dg Hip Unilat With Pelvis 2-3 Views Right  Result Date: 02/05/2016 CLINICAL DATA:  Fall.  Right hip pain. EXAM: DG HIP (WITH OR WITHOUT PELVIS) 2-3V RIGHT COMPARISON:  Right hip radiographs 08/05/2015 FINDINGS: The right hip is located. No acute bone or soft tissue abnormality is present. Pelvis is intact. Vascular calcifications are noted. IMPRESSION: 1. No acute abnormality. 2. Atherosclerosis. Electronically Signed   By: Marin Roberts M.D.   On: 02/05/2016 23:48    EKG:  ST with biV pacing ST with LBBB   ASSESSMENT AND PLAN:   CAD/CABG:  Stable no chest pain no ASA continue beta blocker AVR:  Tissue with stable appearance on ech 08/15/25   PAF: previous ablation on chronic coumadin Given age, dementia, delerium, falls And now CNS bleed not clear that he should be back on coumadin. Had Dr Ladona Ridgel Review ECG;s and he agrees that he appears to be in sinus tachycardia now and not PAF/flutter. Will have pacer interogated and if no or low burden of PAF  would consider Just low dose ASA 81 mg for his CAD.  Increase beta blocker to 50 bid for rate control  Thyroid: last admission T4 elevated and TSH suppressed needs further w/u for this as hyperthyroidism may be driving his tachycardia and put him at increased risk of PAF And stroke off coumadin. ? Start methimazole increased beta blocker have written for  Repeat labs   Signed: Charlton Hawseter Nishan 02/06/2016, 4:30 PM

## 2016-02-06 NOTE — Consult Note (Signed)
CC:  Chief Complaint  Patient presents with  . 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 male s/p fall at nursing home.  He has been at his neurologic baseline since arrival to the ER.  He was observed in the ICU overnight.  PMH: Past Medical History:  Diagnosis Date  . Anxiety   . Aortic stenosis, moderate    moderate to severe per echo 03/2012 with AVR in March of 2014  . Atrial flutter [facility](HCC)    s/p CTI ablation by Dr Ladona Ridgelaylor 1/12  . Atrial tachycardia (HCC)   . Bifascicular block   . CAD (coronary artery disease)    s/p CABG x 5 2003 by Dr Laneta SimmersBartle  . Complete heart block (HCC) 08/21/15   [facility] PPM, Dr. Johney FrameAllred  . DDD (degenerative disc disease)   . 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 Depression   . Diverticulosis   . DJD (degenerative joint disease)   . Hemorrhoids   . Hyperlipidemia   . Hypertension   . Nephrolithiasis   . OSA (obstructive sleep apnea)   . S/P AVR (aortic valve replacement)   . Spinal stenosis of lumbar region   . Type II or unspecified type diabetes mellitus without mention of complication, not stated as uncontrolled     PSH: Past Surgical History:  Procedure Laterality Date  . ANAL FISSURE REPAIR    . AORTIC VALVE REPLACEMENT N/A 09/01/2012   Procedure: AORTIC VALVE REPLACEMENT (AVR);  Surgeon: Alleen BorneBryan K Bartle, MD;  [facility];  Service: Open Heart Surgery;  Laterality: N/A;  . ATRIAL ABLATION SURGERY  1/12   atrial flutter ablation by Dr Ladona Ridgelaylor  . BACK SURGERY  2010  . CABG  2003   by Dr Laneta SimmersBartle  . CARDIOVERSION  04/07/2012   Procedure: CARDIOVERSION;  Surgeon: Wendall StadePeter C Nishan, MD;  [facility];  Service: Cardiovascular;  Laterality: N/A;  . CORONARY ARTERY BYPASS GRAFT     10 yrs ago   03  . EP IMPLANTABLE DEVICE N/A 08/21/2015   [facility] PPM, Dr. Johney FrameAllred  . INTRAOPERATIVE TRANSESOPHAGEAL ECHOCARDIOGRAM N/A 09/01/2012   Procedure: INTRAOPERATIVE TRANSESOPHAGEAL ECHOCARDIOGRAM;  Surgeon: Alleen BorneBryan K Bartle, MD;   [facility];  Service: Open Heart Surgery;  Laterality: N/A;  . LEG SURGERY     right  . TONSILLECTOMY    . TRIGGER FINGER RELEASE     bilat.    SH: Social History  Substance Use Topics  . Smoking status: Former Smoker    Packs/day: 1.00    Years: 49.00    Quit date: 08/21/2001  . Smokeless tobacco: Never Used     Comment: started at age 89.   [name]. Alcohol use No     Comment: quit 15 yrs ago    MEDS: Prior to Admission medications   Medication Sig Start Date End Date Taking? Authorizing Provider  acetaminophen (TYLENOL) 500 MG tablet Take 2 tablets (1,000 mg total) by mouth 3 (three) times daily. 01/14/16  Yes Jessica Ratliff Hoffman, DO  albuterol (PROVENTIL HFA;VENTOLIN HFA) 108 (90 BASE) MCG/ACT inhaler Inhale 2 puffs into the lungs every 6 (six) hours as needed for wheezing. 08/09/13  Yes Barbaraann ShareKeith M Clance, MD  aspirin 81 MG tablet Take 81 mg by mouth daily.   Yes Historical Provider, MD  budesonide-formoterol (SYMBICORT) 160-4.5 MCG/ACT inhaler Inhale 2 puffs into the lungs 2 (two) times daily. 03/08/14  Yes Barbaraann ShareKeith M Clance, MD  cilostazol (PLETAL) 50 MG tablet Take  50 mg by mouth 2 (two) times daily.   Yes Historical Provider, MD  dicyclomine (BENTYL) 20 MG tablet Take 20 mg by mouth every 6 (six) hours. 01/29/16  Yes Historical Provider, MD  finasteride (PROSCAR) 5 MG tablet Take 1 tablet (5 mg total) by mouth daily. 01/14/16  Yes Jessica Ratliff Hoffman, DO  insulin glargine (LANTUS) 100 unit/mL SOPN Inject 20 Units into the skin at bedtime.   Yes Historical Provider, MD  insulin lispro (HUMALOG KWIKPEN) 100 UNIT/ML KiwkPen Inject 0-12 Units into the skin 3 (three) times daily. 80-150 = 0 units 151-200 = 2 units 201-300 = 4 units 301-350 = 6 units 351-400 = 8 units >400 = 12 units and call MD   Yes Historical Provider, MD  losartan (COZAAR) 100 MG tablet Take 1 tablet (100 mg total) by mouth daily. 08/14/15  Yes Hillis RangeJames Allred, MD  memantine (NAMENDA) 10 MG tablet Take 1 tablet (10  mg total) by mouth 2 (two) times daily. 04/11/15  Yes Porfirio Mylararmen Dohmeier, MD  metoprolol tartrate (LOPRESSOR) 25 MG tablet Take 1 tablet (25 mg total) by mouth 2 (two) times daily. 09/05/15  Yes Hillis RangeJames Allred, MD  Multiple Vitamins-Minerals (MULTIVITAMIN WITH MINERALS) tablet Take 1 tablet by mouth daily.   Yes Historical Provider, MD  ramelteon (ROZEREM) 8 MG tablet Take 1 tablet (8 mg total) by mouth at bedtime. 01/14/16  Yes Jessica Ratliff Hoffman, DO  senna-docusate (SENOKOT-S) 8.6-50 MG tablet Take 2 tablets by mouth 2 (two) times daily. Patient taking differently: Take 2 tablets by mouth 2 (two) times daily as needed for mild constipation.  11/02/15  Yes Alison MurrayAlma M Devine, MD  Tamsulosin HCl (FLOMAX) 0.4 MG CAPS Take 0.4 mg by mouth at bedtime.  03/23/11  Yes Historical Provider, MD  temazepam (RESTORIL) 15 MG capsule Take 15 mg by mouth at bedtime. 01/30/16  Yes Historical Provider, MD  venlafaxine (EFFEXOR) 75 MG tablet Take 1 tablet (75 mg total) by mouth daily with supper. 11/02/15  Yes Alison MurrayAlma M Devine, MD    ALLERGY: Allergies  Allergen Reactions  . Doxycycline Swelling    swollen tongue     ROS: ROS  NEUROLOGIC EXAM: Awake, alert, to person and place Otherwise confused Speech fluent, appropriate CN grossly intact Motor exam: Upper Extremities Deltoid Bicep Tricep Grip  Right 5/5 5/5 5/5 5/5  Left 5/5 5/5 5/5 5/5   Lower Extremity IP Quad PF DF EHL  Right 5/5 5/5 5/5 5/5 5/5  Left 5/5 5/5 5/5 5/5 5/5   Sensation grossly intact to LT  IMAGING: Small right medial temporal lobe contusion  IMPRESSION: - 81 y.o. male with dementia, mild traumatic brain injury, small right medial temporal lobe hemorrhage.  At neurological baseline.  PLAN: - No role for surgical intervention - Ok to be discharged later today - Resume anticoagulation in 3 days - No follow up required

## 2016-02-06 NOTE — Progress Notes (Signed)
PROGRESS NOTE    Adam SIA, [name]  ZOX:[id number] DOB: 02-26-1935 DOA: 02/05/2016 PCP: Garlan Fillers, MD   Brief Narrative:  Adam [name] is a pleasant 81 year old gentleman having history of GERD about replacement with porcine valve, atrial fibrillation, on chronic anticoagulation with Coumadin, admitted to the medicine service on 02/06/2016 when he presented as a transfer from his skilled nursing facility. He had a mechanical fall at a skilled nursing facility with CT imaging of head showing 1.6 cm intraparenchymal hemorrhage. Case was discussed with Dr Bevely Palmer of neurosurgery who did not recommend surgical intervention. He was administered vitamin K and Percell Locus has initial lab work revealed INR of 3.56.    Assessment & Plan:   Principal Problem:   Intracranial hemorrhage (HCC) Active Problems:   Diabetes mellitus (HCC)   Essential hypertension   S/P CABG x 5   H/O mechanical aortic valve replacement   Vascular dementia   Hypokalemia   Carotid stenosis  Intracranial hemorrhage -Adam [name] is a 81 year old gentleman with a past medical history of dementia, anticoagulated with warfarin, having a fall at a skilled nursing facility resulting in intracranial hemorrhage. He was found to have an INR of 3.56 on admission. -CT scan showing 1.6 cm parenchymal hemorrhage -Neurosurgery consulted recommended nonoperative management -Anticoagulation has been discontinued and reversed -Continue close monitoring in the stepdown unit  History of bioprosthetic valve replacement -Patient diagnosed with severe aortic stenosis undergoing bioprosthetic valve replacement in 2014   History of paroxysmal atrial fibrillation -He had been anticoagulated with warfarin for this, having a fall at his nursing home that resulted in intraparenchymal hemorrhage -Having advanced dementia, poor functional status, active head bleed, poor candidate for future anticoagulation as I believe the risks outweigh the  benefits in his case.  Advanced dementia with behavioral changes -Over the course of the day he has experienced agitation -On Namenda 10 mg by mouth twice a day -Providing as needed antipsychotic therapy  Hypertension -Continue losartan 100 mg by mouth daily and metoprolol 25 mg by mouth twice a day  Insulin-dependent diabetes mellitus -Continue Lantus 20 units subcutaneous at bedtime with status he'll coverage  Hypokalemia -Presented with potassium of 2.6, repeat potassium 2.9 after IV replacement -We'll give additional potassium today  DVT prophylaxis: SCDs Code Status: DO NOT RESUSCITATE Family Communication: Spoke with his daughter Disposition Plan: Continue monitoring the stepdown unit, anticipate discharge back to skilled nursing facility in the next 2 days  Consultants:   Neurosurgery  Procedures:     Antimicrobials:     Subjective: Patient is agitated during my encounter, tapped and get out of bed, confused, disoriented, family at bedside  Objective: Vitals:   02/06/16 1109 02/06/16 1200 02/06/16 1300 02/06/16 1400  BP: 140/69 136/72 126/81 (!) 139/95  Pulse: (!) 125 96 92   Resp: 12 (!) 26 (!) 24 (!) 34  Temp:  98.2 F (36.8 C)    TempSrc:  Oral    SpO2: 91% 95% 96%   Weight:      Height:       No intake or output data in the 24 hours ending 02/06/16 1420 Filed Weights   02/05/16 2311 02/06/16 0800  Weight: 71 kg (156 lb 8.4 oz) 69.1 kg (152 lb 5.4 oz)    Examination:  General exam: Agitated, confused disoriented Respiratory system: Clear to auscultation. Respiratory effort normal. Cardiovascular system: Tachycardic, irregular rate and rhythm No JVD, murmurs, rubs, gallops or clicks. No pedal edema. Gastrointestinal system: Abdomen is nondistended, soft and nontender. No  organomegaly or masses felt. Normal bowel sounds heard. Central nervous system: Alert and oriented. No focal neurological deficits. Extremities: Symmetric 5 x 5 power. Skin: No  rashes, lesions or ulcers Psychiatry: Agitated, confused, disoriented    Data Reviewed: I have personally reviewed following labs and imaging studies  CBC:  Recent Labs Lab 02/05/16 2332  WBC 6.5  NEUTROABS 4.1  HGB 13.3  HCT 41.0  MCV 87.4  PLT 336   Basic Metabolic Panel:  Recent Labs Lab 02/05/16 2332 02/06/16 1047  NA 139 141  K 2.6* 2.9*  CL 103 105  CO2 31 24  GLUCOSE 148* 159*  BUN 10 6  CREATININE 0.73 0.67  CALCIUM 8.7* 8.8*  MG 1.8  --    GFR: Estimated Creatinine Clearance: 67.6 mL/min (by C-G formula based on SCr of 0.8 mg/dL). Liver Function Tests:  Recent Labs Lab 02/05/16 2332  AST 19  ALT 14*  ALKPHOS 68  BILITOT 0.6  PROT 6.5  ALBUMIN 3.3*   No results for input(s): LIPASE, AMYLASE in the last 168 hours. No results for input(s): AMMONIA in the last 168 hours. Coagulation Profile:  Recent Labs Lab 02/05/16 2332 02/06/16 0218  INR 3.56 1.37   Cardiac Enzymes: No results for input(s): CKTOTAL, CKMB, CKMBINDEX, TROPONINI in the last 168 hours. BNP (last 3 results) No results for input(s): PROBNP in the last 8760 hours. HbA1C: No results for input(s): HGBA1C in the last 72 hours. CBG:  Recent Labs Lab 02/06/16 1110  GLUCAP 181*   Lipid Profile: No results for input(s): CHOL, HDL, LDLCALC, TRIG, CHOLHDL, LDLDIRECT in the last 72 hours. Thyroid Function Tests: No results for input(s): TSH, T4TOTAL, FREET4, T3FREE, THYROIDAB in the last 72 hours. Anemia Panel: No results for input(s): VITAMINB12, FOLATE, FERRITIN, TIBC, IRON, RETICCTPCT in the last 72 hours. Sepsis Labs: No results for input(s): PROCALCITON, LATICACIDVEN in the last 168 hours.  Recent Results (from the past 240 hour(s))  MRSA PCR Screening     Status: None   Collection Time: 02/06/16  6:47 AM  Result Value Ref Range Status   MRSA by PCR NEGATIVE NEGATIVE Final    Comment:        The GeneXpert MRSA Assay (FDA approved for NASAL specimens only), is one  component of a comprehensive MRSA colonization surveillance program. It is not intended to diagnose MRSA infection nor to guide or monitor treatment for MRSA infections.          Radiology Studies: Ct Head Wo Contrast  Result Date: 02/06/2016 CLINICAL DATA:  Status post fall, hitting head on wall. Concern for cervical spine injury. Patient on Coumadin. Initial encounter. EXAM: CT HEAD WITHOUT CONTRAST CT CERVICAL SPINE WITHOUT CONTRAST TECHNIQUE: Multidetector CT imaging of the head and cervical spine was performed following the standard protocol without intravenous contrast. Multiplanar CT image reconstructions of the cervical spine were also generated. COMPARISON:  CT of the head performed 01/12/2016 FINDINGS: CT HEAD FINDINGS There is a 1.6 cm acute intraparenchymal bleed or contusion at the superior aspect of the right medial temporal lobe. No significant mass effect or midline shift is seen at this time. Prominence of the ventricles and sulci reflects moderate cortical volume loss. Scattered periventricular and subcortical white matter change likely reflects small vessel ischemic microangiopathy. The brainstem and fourth ventricle are within normal limits. The basal ganglia are unremarkable in appearance. The cerebral hemispheres demonstrate grossly normal gray-white differentiation. There is no evidence of fracture; visualized osseous structures are unremarkable in appearance. The orbits  are within normal limits. The paranasal sinuses and mastoid air cells are well-aerated. No significant soft tissue abnormalities are seen. CT CERVICAL SPINE FINDINGS There is no evidence of fracture or subluxation. Vertebral bodies demonstrate normal height and alignment. Mild multilevel disc space narrowing is noted along the cervical spine, with scattered anterior and posterior disc osteophyte complexes, and underlying facet disease. Prevertebral soft tissues are within normal limits. A vague 2.5 cm  hypodensity is noted at the left thyroid lobe. Mild emphysematous change and scattered peripheral scarring are seen at the lung apices. Dense calcification is seen at the carotid bifurcations bilaterally, with likely severe bilateral luminal narrowing. The patient is status post median sternotomy. A pacemaker is noted at the left chest wall. IMPRESSION: 1. 1.6 cm acute intraparenchymal bleed or contusion at the superior aspect of the right medial temporal lobe. 2. No evidence of fracture or subluxation along the cervical spine. 3. Moderate cortical volume loss and scattered small vessel ischemic microangiopathy. 4. Mild degenerative change along the cervical spine. 5. Mild emphysematous change and scattered peripheral scarring at the lung apices. 6. Vague 2.5 cm hypodensity at the left thyroid lobe. Consider further evaluation with thyroid ultrasound. If patient is clinically hyperthyroid, consider nuclear medicine thyroid uptake and scan. 7. Dense calcification at the carotid bifurcations bilaterally, with likely severe bilateral luminal narrowing. Carotid ultrasound is recommended for further evaluation, when and as deemed clinically appropriate. Critical Value/emergent results were called by telephone at the time of interpretation on 02/06/2016 at 12:18 am to [facility] SAND[facility] PA, who verbally acknowledged these results. Electronically Signed   By: Roanna RaiderJeffery  Chang M.D.   On: 02/06/2016 00:19   Ct Cervical Spine Wo Contrast  Result Date: 02/06/2016 CLINICAL DATA:  Status post fall, hitting head on wall. Concern for cervical spine injury. Patient on Coumadin. Initial encounter. EXAM: CT HEAD WITHOUT CONTRAST CT CERVICAL SPINE WITHOUT CONTRAST TECHNIQUE: Multidetector CT imaging of the head and cervical spine was performed following the standard protocol without intravenous contrast. Multiplanar CT image reconstructions of the cervical spine were also generated. COMPARISON:  CT of the head performed 01/12/2016  FINDINGS: CT HEAD FINDINGS There is a 1.6 cm acute intraparenchymal bleed or contusion at the superior aspect of the right medial temporal lobe. No significant mass effect or midline shift is seen at this time. Prominence of the ventricles and sulci reflects moderate cortical volume loss. Scattered periventricular and subcortical white matter change likely reflects small vessel ischemic microangiopathy. The brainstem and fourth ventricle are within normal limits. The basal ganglia are unremarkable in appearance. The cerebral hemispheres demonstrate grossly normal gray-white differentiation. There is no evidence of fracture; visualized osseous structures are unremarkable in appearance. The orbits are within normal limits. The paranasal sinuses and mastoid air cells are well-aerated. No significant soft tissue abnormalities are seen. CT CERVICAL SPINE FINDINGS There is no evidence of fracture or subluxation. Vertebral bodies demonstrate normal height and alignment. Mild multilevel disc space narrowing is noted along the cervical spine, with scattered anterior and posterior disc osteophyte complexes, and underlying facet disease. Prevertebral soft tissues are within normal limits. A vague 2.5 cm hypodensity is noted at the left thyroid lobe. Mild emphysematous change and scattered peripheral scarring are seen at the lung apices. Dense calcification is seen at the carotid bifurcations bilaterally, with likely severe bilateral luminal narrowing. The patient is status post median sternotomy. A pacemaker is noted at the left chest wall. IMPRESSION: 1. 1.6 cm acute intraparenchymal bleed or contusion at the superior aspect of  the right medial temporal lobe. 2. No evidence of fracture or subluxation along the cervical spine. 3. Moderate cortical volume loss and scattered small vessel ischemic microangiopathy. 4. Mild degenerative change along the cervical spine. 5. Mild emphysematous change and scattered peripheral scarring  at the lung apices. 6. Vague 2.5 cm hypodensity at the left thyroid lobe. Consider further evaluation with thyroid ultrasound. If patient is clinically hyperthyroid, consider nuclear medicine thyroid uptake and scan. 7. Dense calcification at the carotid bifurcations bilaterally, with likely severe bilateral luminal narrowing. Carotid ultrasound is recommended for further evaluation, when and as deemed clinically appropriate. Critical Value/emergent results were called by telephone at the time of interpretation on 02/06/2016 at 12:18 am to [facility], who verbally acknowledged these results. Electronically Signed   By: Roanna RaiderJeffery  Chang M.D.   On: 02/06/2016 00:19   Dg Hip Unilat With Pelvis 2-3 Views Right  Result Date: 02/05/2016 CLINICAL DATA:  Fall.  Right hip pain. EXAM: DG HIP (WITH OR WITHOUT PELVIS) 2-3V RIGHT COMPARISON:  Right hip radiographs 08/05/2015 FINDINGS: The right hip is located. No acute bone or soft tissue abnormality is present. Pelvis is intact. Vascular calcifications are noted. IMPRESSION: 1. No acute abnormality. 2. Atherosclerosis. Electronically Signed   By: Marin Robertshristopher  Mattern M.D.   On: 02/05/2016 23:48        Scheduled Meds: . acetaminophen  1,000 mg Oral TID  . cilostazol  50 mg Oral BID  . dicyclomine  20 mg Oral Q6H  . finasteride  5 mg Oral Daily  . insulin aspart  0-15 Units Subcutaneous TID WC  . insulin aspart  0-5 Units Subcutaneous QHS  . insulin glargine  20 Units Subcutaneous QHS  . losartan  100 mg Oral Daily  . memantine  10 mg Oral BID  . metoprolol tartrate  25 mg Oral BID  . mometasone-formoterol  2 puff Inhalation BID  . pantoprazole  40 mg Oral Daily  . tamsulosin  0.4 mg Oral QHS  . temazepam  15 mg Oral QHS  . venlafaxine  75 mg Oral Q supper   Continuous Infusions:    LOS: 0 days    Time spent:     Jeralyn BennettZAMORA, Jacub Waiters, MD [facility] Pager [phone]  If 7PM-7AM, please contact night-coverage [email or web address] Password  [email or web address] 02/06/2016, 2:20 PM

## 2016-02-06 NOTE — Progress Notes (Signed)
Spoke with wife Adam [name] about need to apply waist restraint and [facility] [place] [facility] [place] [facility]

## 2016-02-06 NOTE — Evaluation (Signed)
Occupational Therapy Evaluation Patient Details Name: Adam PasseCharles D Padmore, [name] MRN: [id number] DOB: 1934/12/01 Today's Date: 02/06/2016    History of Present Illness 81 y.o. male s/p fall at SNF on 8/17. CT revealed ICH of R medial temporal lobe. PMHx: pt with complex cardiac hx, CAD, CABG, s/p AVR, DDD, DM II, lumbar spinal stenosis, OSA, HTN, HLD, anxiety, Depression, Dementia.   Clinical Impression   Per daughter, pt required assist with dressing but could bathe self with supervision PTA. Pt presenting with decreased ability to follow simple one step commands, impaired balance, generalized UE weakness, and decreased safety awareness impacting his independence and safety with ADL and functional mobility. Pt able to perform functional mobility with mod hand held assist +2. Discussed post acute rehab with daughter; she is agreeable. Recommending SNF for follow up to maximize independence and safety with ADL and functional mobility. Pt would benefit from continued skilled OT to address established goals.    Follow Up Recommendations  SNF;Supervision/Assistance - 24 hour    Equipment Recommendations  Other (comment) (TBD at next venue)    Recommendations for Other Services       Precautions / Restrictions Precautions Precautions: Fall Restrictions Weight Bearing Restrictions: No      Mobility Bed Mobility Overal bed mobility: Needs Assistance Bed Mobility: Supine to Sit;Sit to Supine     Supine to sit: Min assist Sit to supine: Min assist   General bed mobility comments: Mod assist to initiate movemet to EOB. Assist for controlled descent from sit to supine. Cueing required throughout for initiation and participation.  Transfers Overall transfer level: Needs assistance Equipment used: 2 person hand held assist Transfers: Sit to/from Stand Sit to Stand: Mod assist;+2 physical assistance         General transfer comment: Assist to boost up from EOB and for balance in  standing.    Balance Overall balance assessment: Needs assistance   Sitting balance-Leahy Scale: Fair     Standing balance support: Bilateral upper extremity supported Standing balance-Leahy Scale: Poor                              ADL Overall ADL's : Needs assistance/impaired                         Toilet Transfer: Moderate assistance;+2 for physical assistance;Ambulation;BSC Toilet Transfer Details (indicate cue type and reason): Simulated by sit to stand from EOB with functional mobility in room.         Functional mobility during ADLs: Moderate assistance;+2 for physical assistance (hand held assist) General ADL Comments: Pt requires consistent cueing and encouragement to participate in activities. Currenlty pt would require max-total assist for ADLs but only due to decreased participation. Feel pt would perform with decreased level of assist if engaged in task.     Vision Additional Comments: Daughter reports that pt has glasses and wears them at all times but had recent surgery and should have ok vision. Pt not c/o visual deficits during eval but requires further assessment.   Perception     Praxis      Pertinent Vitals/Pain Pain Assessment: Faces Faces Pain Scale: Hurts even more Pain Location: catheter site Pain Descriptors / Indicators: Guarding;Grimacing Pain Intervention(s): Monitored during session     Hand Dominance Right   Extremity/Trunk Assessment Upper Extremity Assessment Upper Extremity Assessment: Generalized weakness   Lower Extremity Assessment Lower Extremity Assessment: Defer to PT  evaluation   Cervical / Trunk Assessment Cervical / Trunk Assessment: Kyphotic   Communication Communication Communication: No difficulties   Cognition Arousal/Alertness: Awake/alert Behavior During Therapy: Flat affect Overall Cognitive Status: Impaired/Different from baseline Area of Impairment:  Orientation;Attention;Memory;Following commands;Safety/judgement;Awareness;Problem solving Orientation Level: Disoriented to;Time;Situation Current Attention Level: Focused Memory: Decreased short-term memory Following Commands: Follows one step commands inconsistently Safety/Judgement: Decreased awareness of safety;Decreased awareness of deficits Awareness: Intellectual Problem Solving: Slow processing;Decreased initiation;Difficulty sequencing;Requires verbal cues;Requires tactile cues General Comments: Disoriented to year PTA, poor insight, awareness, and judgement. Daughter reports cognition is worse now than at baseline.   General Comments       Exercises       Shoulder Instructions      Home Living Family/patient expects to be discharged to:: Skilled nursing facility                                 Additional Comments: Resident at SNF.      Prior Functioning/Environment Level of Independence: Needs assistance  Gait / Transfers Assistance Needed: Walking with rollator (shuffled gait) ADL's / Homemaking Assistance Needed: Setup and min assist for dressing. Supervision for bathing.        OT Diagnosis: Generalized weakness;Cognitive deficits;Acute pain;Altered mental status   OT Problem List: Decreased strength;Impaired balance (sitting and/or standing);Decreased coordination;Decreased cognition;Decreased safety awareness;Decreased knowledge of use of DME or AE;Pain   OT Treatment/Interventions: Self-care/ADL training;Therapeutic exercise;Energy conservation;DME and/or AE instruction;Therapeutic activities;Patient/family education;Balance training    OT Goals(Current goals can be found in the care plan section) Acute Rehab OT Goals Patient Stated Goal: get back to bed OT Goal Formulation: With patient Time For Goal Achievement: 02/20/16 Potential to Achieve Goals: Good ADL Goals Pt Will Perform Grooming: with min guard assist;standing Pt Will Perform  Upper Body Bathing: with min guard assist;with set-up;sitting Pt Will Perform Lower Body Bathing: with set-up;with min guard assist;sit to/from stand Pt Will Transfer to Toilet: with min guard assist;ambulating;bedside commode Pt Will Perform Toileting - Clothing Manipulation and hygiene: with min guard assist;sit to/from stand Pt/caregiver will Perform Home Exercise Program: Increased strength;Both right and left upper extremity;With theraband;With Supervision;With written HEP provided  OT Frequency: Min 2X/week   Barriers to D/C:            Co-evaluation PT/OT/SLP Co-Evaluation/Treatment: Yes Reason for Co-Treatment: Complexity of the patient's impairments (multi-system involvement);Necessary to address cognition/behavior during functional activity;For patient/therapist safety   OT goals addressed during session: Other (comment) (functional mobility)      End of Session Equipment Utilized During Treatment: Gait belt Nurse Communication: Mobility status  Activity Tolerance: Patient tolerated treatment well Patient left: in bed;with call bell/phone within reach;with family/visitor present   Time: 6578-46961458-1528 OT Time Calculation (min): 30 min Charges:  OT General Charges $OT Visit: 1 Procedure OT Evaluation $OT Eval Moderate Complexity: 1 Procedure G-Codes:     Gaye AlkenBailey A Joriel Streety M.S., OTR/L Pager: [phone]  02/06/2016, 5:05 PM

## 2016-02-06 NOTE — Progress Notes (Signed)
OT Cancellation Note  Patient Details Name: Adam PasseCharles D Soffer, [name] MRN: [id number] DOB: [birth year]   Cancelled Treatment:    Reason Eval/Treat Not Completed: Patient not medically ready (active bedrest orders)  Gaye AlkenBailey A Rigby Swamy M.S., OTR/L Pager: [phone]  02/06/2016, 7:23 AM

## 2016-02-06 NOTE — ED Notes (Signed)
Family expressing concerns for hypertension, admitting MD paged.

## 2016-02-06 NOTE — ED Notes (Signed)
Pt confused and not following commands well. When asked to drink pt would not.

## 2016-02-06 NOTE — ED Notes (Signed)
Spoke with main lab, they will add on mag level. 

## 2016-02-06 NOTE — Care Management Note (Addendum)
Case Management Note  Patient Details  Name: Retia PasseCharles D Yeoman, [name] MRN: [id number] Date of Birth: [birth year]  Subjective/Objective:  Pt admitted on 02/05/16 s/p fall with mild traumatic brain injury.  Pt with dementia; PTA, [facility]                   Action/Plan: CSW consulted to facilitate return to SNF when medically stable.  Will follow progress.    Expected Discharge Date:                  Expected Discharge Plan:  Skilled Nursing Facility  In-House Referral:  Clinical Social Work  Discharge planning Services  CM Consult  Post Acute Care Choice:    Choice offered to:     DME Arranged:    DME Agency:     HH Arranged:    HH Agency:     Status of Service:  In process, will continue to follow  If discussed at Long Length of Stay Meetings, dates discussed:    Additional Comments:  Quintella BatonJulie W. Aneth Schlagel, RN, BSN  Trauma/Neuro ICU Case Manager [phone]

## 2016-02-06 NOTE — Progress Notes (Signed)
Patient unable to follow commands (lips on mouthpiece, deep inspiration, etc) needed to appropriately utilize dulera.  If he is to remain inpatient, would suggest changing to aerosolized alternative.

## 2016-02-07 ENCOUNTER — Encounter: Payer: Self-pay | Admitting: Internal Medicine

## 2016-02-07 DIAGNOSIS — E876 Hypokalemia: Secondary | ICD-10-CM

## 2016-02-07 LAB — GLUCOSE, CAPILLARY
GLUCOSE-CAPILLARY: 132 mg/dL — AB (ref 65–99)
GLUCOSE-CAPILLARY: 135 mg/dL — AB (ref 65–99)
Glucose-Capillary: 111 mg/dL — ABNORMAL HIGH (ref 65–99)

## 2016-02-07 LAB — CBC
HCT: 40.4 % (ref 39.0–52.0)
Hemoglobin: 12.7 g/dL — ABNORMAL LOW (ref 13.0–17.0)
MCH: 27.8 pg (ref 26.0–34.0)
MCHC: 31.4 g/dL (ref 30.0–36.0)
MCV: 88.4 fL (ref 78.0–100.0)
PLATELETS: 338 10*3/uL (ref 150–400)
RBC: 4.57 MIL/uL (ref 4.22–5.81)
RDW: 14.6 % (ref 11.5–15.5)
WBC: 8.6 10*3/uL (ref 4.0–10.5)

## 2016-02-07 LAB — BASIC METABOLIC PANEL
Anion gap: 9 (ref 5–15)
BUN: 11 mg/dL (ref 6–20)
CALCIUM: 8.7 mg/dL — AB (ref 8.9–10.3)
CHLORIDE: 106 mmol/L (ref 101–111)
CO2: 28 mmol/L (ref 22–32)
CREATININE: 0.65 mg/dL (ref 0.61–1.24)
GFR calc Af Amer: >60 mL/min (ref >60)
GFR calc non Af Amer: >60 mL/min (ref >60)
Glucose, Bld: 115 mg/dL — ABNORMAL HIGH (ref 65–99)
Potassium: 3.8 mmol/L (ref 3.5–5.1)
SODIUM: 143 mmol/L (ref 135–145)

## 2016-02-07 LAB — URINE CULTURE: CULTURE: NO GROWTH

## 2016-02-07 LAB — TSH: TSH: 0.394 u[IU]/mL (ref 0.350–4.500)

## 2016-02-07 LAB — T4, FREE: Free T4: 1.4 ng/dL — ABNORMAL HIGH (ref 0.61–1.12)

## 2016-02-07 MED ORDER — OXYCODONE HCL 5 MG PO TABS
10.0000 mg | ORAL_TABLET | Freq: Four times a day (QID) | ORAL | Status: DC | PRN
  Administered 2016-02-08 – 2016-02-09 (×4): 10 mg via ORAL
  Filled 2016-02-07 (×4): qty 2

## 2016-02-07 MED ORDER — METHIMAZOLE 10 MG PO TABS
10.0000 mg | ORAL_TABLET | Freq: Every day | ORAL | Status: DC
  Administered 2016-02-07 – 2016-02-10 (×4): 10 mg via ORAL
  Filled 2016-02-07 (×4): qty 1

## 2016-02-07 MED ORDER — HALOPERIDOL LACTATE 5 MG/ML IJ SOLN: 5.0000 mg | Freq: Four times a day (QID) | INTRAMUSCULAR | Status: DC | PRN

## 2016-02-07 NOTE — Care Management Note (Signed)
Case Management Note  Patient Details  Name: Adam PasseCharles D Thiam, Adam [name] MRN: [id number] Date of Birth: 1934-10-26  Subjective/Objective:  [facility], presents with ICH, CSW referral.                  Action/Plan:   Expected Discharge Date:                  Expected Discharge Plan:  Skilled Nursing Facility  In-House Referral:  Clinical Social Work  Discharge planning Services  CM Consult  Post Acute Care Choice:    Choice offered to:     DME Arranged:    DME Agency:     HH Arranged:    HH Agency:     Status of Service:  Completed, signed off  If discussed at [employer] Meetings, dates discussed:    Additional Comments:  Leone Havenaylor, Teaghan Formica Clinton, RN 02/07/2016, 4:39 PM

## 2016-02-07 NOTE — Evaluation (Signed)
Speech Language Pathology Evaluation Patient Details Name: Adam PasseCharles D Misuraca, [name] MRN: [id number] DOB: [birth year] Today's Date: 02/07/2016 Time: [id number] SLP Time Calculation (min) (ACUTE ONLY): 9 min  Problem List:  Patient Active Problem List   Diagnosis Date Noted  . Intracranial hemorrhage (HCC) 02/06/2016  . Hypokalemia 02/06/2016  . Carotid stenosis 02/06/2016  . Hyperthyroidism   . Altered mental status 10/28/2015  . Acute encephalopathy 10/28/2015  . Supratherapeutic INR 10/28/2015  . Fall 10/28/2015  . Mobitz type II atrioventricular block 08/21/2015  . Complete heart block (HCC) 08/21/2015  . Syncope 08/14/2015  . Vascular dementia 08/12/2015  . Memory loss 10/11/2014  . CAP (community acquired pneumonia) 08/28/2014  . OSA on CPAP 10/10/2013  . Hypersomnia with sleep apnea 10/10/2013  . Bifascicular block 08/03/2013  . Encounter for therapeutic drug monitoring 07/20/2013  . Insomnia, persistent 05/29/2013  . Unspecified sleep apnea 05/29/2013  . 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 OSA (obstructive sleep apnea)   . H/O mechanical aortic valve replacement 09/08/2012  . Long term (current) use of anticoagulants 09/08/2012  . S/P CABG x 5 08/19/2012  . Atrial tachycardia (HCC) 04/25/2012  . Fall from furniture 03/30/2011  . CAD (coronary artery disease) 09/30/2010  . Atrial flutter (HCC) 09/30/2010  . Aortic stenosis 09/30/2010  . Obese 09/30/2010  . Atrial fibrillation (HCC) 09/30/2010  . DYSPNEA 12/25/2007  . Diabetes mellitus (HCC) 11/16/2007  . HYPERLIPIDEMIA 11/16/2007  . Essential hypertension 11/16/2007  . COPD (chronic obstructive pulmonary disease) (HCC) 11/16/2007   Past Medical History:  Past Medical History:  Diagnosis Date  . Anxiety   . Aortic stenosis, moderate    moderate to severe per echo 03/2012 with AVR in March of 2014  . Atrial flutter [facility](HCC)    s/p CTI ablation by Dr Ladona Ridgelaylor 1/12  . Atrial tachycardia (HCC)    . Bifascicular block   . CAD (coronary artery disease)    s/p CABG x 5 2003 by Dr Laneta SimmersBartle  . Complete heart block (HCC) 08/21/15   STJ PPM, Dr. Johney FrameAllred  . DDD (degenerative disc disease)   . 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 Depression   . Diverticulosis   . DJD (degenerative joint disease)   . Hemorrhoids   . Hyperlipidemia   . Hypertension   . Nephrolithiasis   . OSA (obstructive sleep apnea)   . S/P AVR (aortic valve replacement)   . Spinal stenosis of lumbar region   . Type II or unspecified type diabetes mellitus without mention of complication, not stated as uncontrolled    Past Surgical History:  Past Surgical History:  Procedure Laterality Date  . ANAL FISSURE REPAIR    . AORTIC VALVE REPLACEMENT N/A 09/01/2012   Procedure: AORTIC VALVE REPLACEMENT (AVR);  Surgeon: Alleen BorneBryan K Bartle, MD;  [facility];  Service: Open Heart Surgery;  Laterality: N/A;  . ATRIAL ABLATION SURGERY  1/12   atrial flutter ablation by Dr Ladona Ridgelaylor  . BACK SURGERY  2010  . CABG  2003   by Dr Laneta SimmersBartle  . CARDIOVERSION  04/07/2012   Procedure: CARDIOVERSION;  Surgeon: Wendall StadePeter C Nishan, MD;  [facility];  Service: Cardiovascular;  Laterality: N/A;  . CORONARY ARTERY BYPASS GRAFT     10 yrs ago   03  . EP IMPLANTABLE DEVICE N/A 08/21/2015   STJ PPM, Dr. Johney FrameAllred  . INTRAOPERATIVE TRANSESOPHAGEAL ECHOCARDIOGRAM N/A 09/01/2012   Procedure: INTRAOPERATIVE TRANSESOPHAGEAL ECHOCARDIOGRAM;  Surgeon: Alleen BorneBryan K Bartle, MD;  Location: [facility] OR;  Service: Open Heart Surgery;  Laterality: N/A;  . LEG SURGERY     right  . TONSILLECTOMY    . TRIGGER FINGER RELEASE     bilat.   HPI:  81 y.o. male s/p fall at SNF on 8/17. CT revealed ICH of R medial temporal lobe. PMHx: pt with complex cardiac hx, CAD, CABG, s/p AVR, DDD, DM II, lumbar spinal stenosis, OSA, HTN, HLD, anxiety, Depression, Dementia.   Assessment / Plan / Recommendation Clinical Impression  Pt presents with significant  cognitive impairment s/p fall and ICH, exacerbating a baseline dementia.  Pt known to SLP services and evaluated during May 2017 admission.  Today he presents with agitation and restlessness; he perseverates and is difficult to calm and distract.  Pt oriented to self only.  Poor focused attention; confabulatory output; speaks to persons who are not in the room.  Easily agitated with questions, so evaluation was limited.  SLP will follow acutely; rec ongoing assessment and family education.      SLP Assessment  Patient needs continued Speech Lanaguage Pathology Services    Follow Up Recommendations  Skilled Nursing facility    Frequency and Duration min 2x/week  2 weeks      SLP Evaluation Prior Functioning  Cognitive/Linguistic Baseline: Baseline deficits Baseline deficit details: advanced dementia Type of Home: Skilled Nursing Facility Vocation: Retired   IT consultantCognition  Overall Cognitive Status: Impaired/Different from baseline Arousal/Alertness: Awake/alert Orientation Level: Oriented to person;Disoriented to place;Disoriented to time;Disoriented to situation Attention: Focused Focused Attention: Impaired Focused Attention Impairment: Verbal basic Memory: Impaired Memory Impairment: Storage deficit;Retrieval deficit Awareness: Impaired Awareness Impairment: Intellectual impairment Problem Solving: Impaired Behaviors: Restless;Impulsive;Verbal agitation;Poor frustration tolerance Safety/Judgment: Impaired    Comprehension  Auditory Comprehension Overall Auditory Comprehension: Impaired Reading Comprehension Reading Status: Not tested    Expression Verbal Expression Overall Verbal Expression: Impaired Level of Generative/Spontaneous Verbalization: Conversation   Oral / Motor  Oral Motor/Sensory Function Overall Oral Motor/Sensory Function: Within functional limits Motor Speech Overall Motor Speech: Appears within functional limits for tasks assessed   GO                     Blenda MountsCouture, Devyn Sheerin Laurice 02/07/2016, 2:42 PM

## 2016-02-07 NOTE — Progress Notes (Signed)
Dr. Zachery ConchFriedman with cardiology updated on VT, no new orders received at this time, will wait on blood work result

## 2016-02-07 NOTE — Progress Notes (Signed)
Came to see patient, at this time agitated (this is unchanged for this hospital stay as per RN) with baseline dementia known for the patient  He is aggressive/somewhat combative and exam is deferred.  Insists he is going home  Telemetry has been SR with V pacing today, at times looks ST, given agitation, complete HR control may be difficult, BB was up-titrated yesterday  Device information: SJM dual lead PPM, implanted 08/21/15 for CHB Interrogation today: Normal device function Numerous AMS episodes noted, most appear to be only seconds in duration Longest 5 hours  On Jan 22, 2016 Most recent yesterday only 2-4 seconds in duration AF burden <1%  Patient is here s/p fall at the SNF with trauma, resulting in 1.6 cm acute intraparenchymal bleed or contusion superior aspect of right medial temporal lobe  PMHx includes bioprosthetic AVR, PAFib, CHB, hx of AFlutter ablation,chronically on a/c with warfarin. Records indicate/report out patient with increasing dementia and falls.    Neurosurgery does not recommend surgical intervention, note states would be OK to resume his a/c after 3 days.  The patient remains quite agitated, with reported falls and progressing dementia, uncertain if he is a good candidate any longer for full a/c.  Will await Dr. Jenel LucksAllred's evaluation and and final recommendation from EP standpoint.  Francis Dowseenee Ursuy, PA-C   I have seen, examined the patient, and reviewed the above assessment and plan. On exam, agitated.  Changes to above are made where necessary.  Pacemaker interrogation is reviewed and normal. Currently not a candidate for anticoagulation.  I suspect tachycardia will improve as his agitation improves.  I have follow-up with him in 3 weeks.  Further discussions with him at that time.  Electrophysiology team to see as needed while here. Please call with questions.   Co Sign: Hillis RangeJames Sherrel Shafer, MD

## 2016-02-07 NOTE — Progress Notes (Signed)
Attempted report with 5W nurse. Nurse unavailable at the moment. Left number for RN to call back on.

## 2016-02-07 NOTE — Progress Notes (Signed)
Paged Cardiology  11 beat run VT, no call received

## 2016-02-07 NOTE — Progress Notes (Signed)
PROGRESS NOTE    Adam PasseCharles D Birch, [name]  UJW:[id number] DOB: 08-19-1934 DOA: 02/05/2016 PCP: Adam FillersPATERSON,Adam G, MD   Brief Narrative:  Adam [name] is a pleasant 81 year old gentleman having history of GERD about replacement with porcine valve, atrial fibrillation, on chronic anticoagulation with Coumadin, admitted to the medicine service on 02/06/2016 when he presented as a transfer from his skilled nursing facility. He had a mechanical fall at a skilled nursing facility with CT imaging of head showing 1.6 cm intraparenchymal hemorrhage. Case was discussed with Adam [name] of neurosurgery who did not recommend surgical intervention. He was administered vitamin K and Percell LocusLcentra has initial lab work revealed INR of 3.56.    Assessment & Plan:   Principal Problem:   Intracranial hemorrhage (HCC) Active Problems:   Diabetes mellitus (HCC)   Essential hypertension   S/P CABG x 5   H/O mechanical aortic valve replacement   Vascular dementia   Hypokalemia   Carotid stenosis  Intracranial hemorrhage -Adam [name] is a 81 year old gentleman with a past medical history of dementia, anticoagulated with warfarin, having a fall at a skilled nursing facility resulting in intracranial hemorrhage. He was found to have an INR of 3.56 on admission. -CT scan showing 1.6 cm parenchymal hemorrhage -Neurosurgery consulted recommended nonoperative management -Anticoagulation has been discontinued and reversed  History of bioprosthetic valve replacement -Patient diagnosed with severe aortic stenosis undergoing bioprosthetic valve replacement in 2014   History of paroxysmal atrial fibrillation -He had been anticoagulated with warfarin for this, having a fall at his nursing home that resulted in intraparenchymal hemorrhage -Having advanced dementia, poor functional status, active head bleed, poor candidate for future anticoagulation as I believe the risks outweigh the benefits in his case.  Advanced dementia with  behavioral changes -Over the course of the day he has experienced agitation -On Namenda 10 mg by mouth twice a day -On 02/07/2016 continues to have acute agitation, have scheduled Seroquel 25 mg by mouth twice a day. Spoke with RN possible pain may be a contributor factor, continue as needed oxycodone 5 mg every 4 hours. Would avoid benzodiazepine therapy.   Hypertension -Continue losartan 100 mg by mouth daily and metoprolol 25 mg by mouth twice a day  Insulin-dependent diabetes mellitus -Continue Lantus 20 units subcutaneous at bedtime with status sliding scale coverage  Hypokalemia -Presented with potassium of 2.6, repeat potassium 2.9 after IV replacement -Potassium improved to 3.8 on a.m. lab work after receiving replacement  Hyperthyroidism -Labs showing TSH of 0.394 with Free T4 of 1.4 -Case discussed with [name] of [facility] who recommended methimazole 10 mg PO q daily -He will see him in the outpatient setting  DVT prophylaxis: SCDs Code Status: DO NOT RESUSCITATE Family Communication: Spoke with his daughter and wife on 02/07/2016 Disposition Plan: Continue monitoring the stepdown unit, anticipate discharge back to skilled nursing facility in the next 2 days  Consultants:   Neurosurgery  Procedures:     Antimicrobials:     Subjective: He was sleeping during my encounter, arousable, following simple commands  Objective: Vitals:   02/07/16 0700 02/07/16 0728 02/07/16 0800 02/07/16 1000  BP:  (!) 166/105 (!) 164/84 (!) 141/71  Pulse:  68 82 90  Resp:  (!) 21 (!) 24 (!) 31  Temp: 99 F (37.2 C)   98.6 F (37 C)  TempSrc: Oral   Oral  SpO2:  97% 95% 93%  Weight:      Height:        Intake/Output Summary (Last 24  hours) at 02/07/16 1310 Last data filed at 02/07/16 0945  Gross per 24 hour  Intake              120 ml  Output             1200 ml  Net            -1080 ml   Filed Weights   02/05/16 2311 02/06/16 0800 02/06/16 2238  Weight:  71 kg (156 lb 8.4 oz) 69.1 kg (152 lb 5.4 oz) 69.7 kg (153 lb 10.6 oz)    Examination:  General exam: Resting comfortably  Respiratory system: Clear to auscultation. Respiratory effort normal. Cardiovascular system: Tachycardic, irregular rate and rhythm No JVD, murmurs, rubs, gallops or clicks. No pedal edema. Gastrointestinal system: Abdomen is nondistended, soft and nontender. No organomegaly or masses felt. Normal bowel sounds heard. Central nervous system: Alert and oriented. No focal neurological deficits. Extremities: Symmetric 5 x 5 power. Skin: No rashes, lesions or ulcers Psychiatry: Agitated, confused, disoriented    Data Reviewed: I have personally reviewed following labs and imaging studies  CBC:  Recent Labs Lab 02/05/16 2332 02/07/16 0545  WBC 6.5 8.6  NEUTROABS 4.1  --   HGB 13.3 12.7*  HCT 41.0 40.4  MCV 87.4 88.4  PLT 336 338   Basic Metabolic Panel:  Recent Labs Lab 02/05/16 2332 02/06/16 1047 02/07/16 0545  NA 139 141 143  K 2.6* 2.9* 3.8  CL 103 105 106  CO2 31 24 28   GLUCOSE 148* 159* 115*  BUN 10 6 11   CREATININE 0.73 0.67 0.65  CALCIUM 8.7* 8.8* 8.7*  MG 1.8  --   --    GFR: Estimated Creatinine Clearance: 67.6 mL/min (by C-G formula based on SCr of 0.8 mg/dL). Liver Function Tests:  Recent Labs Lab 02/05/16 2332  AST 19  ALT 14*  ALKPHOS 68  BILITOT 0.6  PROT 6.5  ALBUMIN 3.3*   No results for input(s): LIPASE, AMYLASE in the last 168 hours. No results for input(s): AMMONIA in the last 168 hours. Coagulation Profile:  Recent Labs Lab 02/05/16 2332 02/06/16 0218  INR 3.56 1.37   Cardiac Enzymes: No results for input(s): CKTOTAL, CKMB, CKMBINDEX, TROPONINI in the last 168 hours. BNP (last 3 results) No results for input(s): PROBNP in the last 8760 hours. HbA1C: No results for input(s): HGBA1C in the last 72 hours. CBG:  Recent Labs Lab 02/06/16 1110 02/06/16 1713 02/06/16 2220 02/07/16 0823  GLUCAP 181* 138*  127* 111*   Lipid Profile: No results for input(s): CHOL, HDL, LDLCALC, TRIG, CHOLHDL, LDLDIRECT in the last 72 hours. Thyroid Function Tests:  Recent Labs  02/07/16 0545  TSH 0.394  FREET4 1.40*   Anemia Panel: No results for input(s): VITAMINB12, FOLATE, FERRITIN, TIBC, IRON, RETICCTPCT in the last 72 hours. Sepsis Labs: No results for input(s): PROCALCITON, LATICACIDVEN in the last 168 hours.  Recent Results (from the past 240 hour(s))  MRSA PCR Screening     Status: None   Collection Time: 02/06/16  6:47 AM  Result Value Ref Range Status   MRSA by PCR NEGATIVE NEGATIVE Final    Comment:        The GeneXpert MRSA Assay (FDA approved for NASAL specimens only), is one component of a comprehensive MRSA colonization surveillance program. It is not intended to diagnose MRSA infection nor to guide or monitor treatment for MRSA infections.   Urine culture     Status: None   Collection Time: 02/06/16  9:44 AM  Result Value Ref Range Status   Specimen Description URINE, CATHETERIZED  Final   Special Requests NONE  Final   Culture NO GROWTH  Final   Report Status 02/07/2016 FINAL  Final         Radiology Studies: Ct Head Wo Contrast  Result Date: 02/06/2016 CLINICAL DATA:  Status post fall, hitting head on wall. Concern for cervical spine injury. Patient on Coumadin. Initial encounter. EXAM: CT HEAD WITHOUT CONTRAST CT CERVICAL SPINE WITHOUT CONTRAST TECHNIQUE: Multidetector CT imaging of the head and cervical spine was performed following the standard protocol without intravenous contrast. Multiplanar CT image reconstructions of the cervical spine were also generated. COMPARISON:  CT of the head performed 01/12/2016 FINDINGS: CT HEAD FINDINGS There is a 1.6 cm acute intraparenchymal bleed or contusion at the superior aspect of the right medial temporal lobe. No significant mass effect or midline shift is seen at this time. Prominence of the ventricles and sulci reflects  moderate cortical volume loss. Scattered periventricular and subcortical white matter change likely reflects small vessel ischemic microangiopathy. The brainstem and fourth ventricle are within normal limits. The basal ganglia are unremarkable in appearance. The cerebral hemispheres demonstrate grossly normal gray-white differentiation. There is no evidence of fracture; visualized osseous structures are unremarkable in appearance. The orbits are within normal limits. The paranasal sinuses and mastoid air cells are well-aerated. No significant soft tissue abnormalities are seen. CT CERVICAL SPINE FINDINGS There is no evidence of fracture or subluxation. Vertebral bodies demonstrate normal height and alignment. Mild multilevel disc space narrowing is noted along the cervical spine, with scattered anterior and posterior disc osteophyte complexes, and underlying facet disease. Prevertebral soft tissues are within normal limits. A vague 2.5 cm hypodensity is noted at the left thyroid lobe. Mild emphysematous change and scattered peripheral scarring are seen at the lung apices. Dense calcification is seen at the carotid bifurcations bilaterally, with likely severe bilateral luminal narrowing. The patient is status post median sternotomy. A pacemaker is noted at the left chest wall. IMPRESSION: 1. 1.6 cm acute intraparenchymal bleed or contusion at the superior aspect of the right medial temporal lobe. 2. No evidence of fracture or subluxation along the cervical spine. 3. Moderate cortical volume loss and scattered small vessel ischemic microangiopathy. 4. Mild degenerative change along the cervical spine. 5. Mild emphysematous change and scattered peripheral scarring at the lung apices. 6. Vague 2.5 cm hypodensity at the left thyroid lobe. Consider further evaluation with thyroid ultrasound. If patient is clinically hyperthyroid, consider nuclear medicine thyroid uptake and scan. 7. Dense calcification at the carotid  bifurcations bilaterally, with likely severe bilateral luminal narrowing. Carotid ultrasound is recommended for further evaluation, when and as deemed clinically appropriate. Critical Value/emergent results were called by telephone at the time of interpretation on 02/06/2016 at 12:18 am to [facility], who verbally acknowledged these results. Electronically Signed   By: Roanna Raider M.D.   On: 02/06/2016 00:19   Ct Cervical Spine Wo Contrast  Result Date: 02/06/2016 CLINICAL DATA:  Status post fall, hitting head on wall. Concern for cervical spine injury. Patient on Coumadin. Initial encounter. EXAM: CT HEAD WITHOUT CONTRAST CT CERVICAL SPINE WITHOUT CONTRAST TECHNIQUE: Multidetector CT imaging of the head and cervical spine was performed following the standard protocol without intravenous contrast. Multiplanar CT image reconstructions of the cervical spine were also generated. COMPARISON:  CT of the head performed 01/12/2016 FINDINGS: CT HEAD FINDINGS There is a 1.6 cm acute intraparenchymal bleed or contusion at  the superior aspect of the right medial temporal lobe. No significant mass effect or midline shift is seen at this time. Prominence of the ventricles and sulci reflects moderate cortical volume loss. Scattered periventricular and subcortical white matter change likely reflects small vessel ischemic microangiopathy. The brainstem and fourth ventricle are within normal limits. The basal ganglia are unremarkable in appearance. The cerebral hemispheres demonstrate grossly normal gray-white differentiation. There is no evidence of fracture; visualized osseous structures are unremarkable in appearance. The orbits are within normal limits. The paranasal sinuses and mastoid air cells are well-aerated. No significant soft tissue abnormalities are seen. CT CERVICAL SPINE FINDINGS There is no evidence of fracture or subluxation. Vertebral bodies demonstrate normal height and alignment. Mild multilevel disc  space narrowing is noted along the cervical spine, with scattered anterior and posterior disc osteophyte complexes, and underlying facet disease. Prevertebral soft tissues are within normal limits. A vague 2.5 cm hypodensity is noted at the left thyroid lobe. Mild emphysematous change and scattered peripheral scarring are seen at the lung apices. Dense calcification is seen at the carotid bifurcations bilaterally, with likely severe bilateral luminal narrowing. The patient is status post median sternotomy. A pacemaker is noted at the left chest wall. IMPRESSION: 1. 1.6 cm acute intraparenchymal bleed or contusion at the superior aspect of the right medial temporal lobe. 2. No evidence of fracture or subluxation along the cervical spine. 3. Moderate cortical volume loss and scattered small vessel ischemic microangiopathy. 4. Mild degenerative change along the cervical spine. 5. Mild emphysematous change and scattered peripheral scarring at the lung apices. 6. Vague 2.5 cm hypodensity at the left thyroid lobe. Consider further evaluation with thyroid ultrasound. If patient is clinically hyperthyroid, consider nuclear medicine thyroid uptake and scan. 7. Dense calcification at the carotid bifurcations bilaterally, with likely severe bilateral luminal narrowing. Carotid ultrasound is recommended for further evaluation, when and as deemed clinically appropriate. Critical Value/emergent results were called by telephone at the time of interpretation on 02/06/2016 at 12:18 am to [facility], who verbally acknowledged these results. Electronically Signed   By: Roanna Raider M.D.   On: 02/06/2016 00:19   Dg Hip Unilat With Pelvis 2-3 Views Right  Result Date: 02/05/2016 CLINICAL DATA:  Fall.  Right hip pain. EXAM: DG HIP (WITH OR WITHOUT PELVIS) 2-3V RIGHT COMPARISON:  Right hip radiographs 08/05/2015 FINDINGS: The right hip is located. No acute bone or soft tissue abnormality is present. Pelvis is intact. Vascular  calcifications are noted. IMPRESSION: 1. No acute abnormality. 2. Atherosclerosis. Electronically Signed   By: Marin Roberts M.D.   On: 02/05/2016 23:48        Scheduled Meds: . acetaminophen  1,000 mg Oral TID  . cilostazol  50 mg Oral BID  . dicyclomine  20 mg Oral Q6H  . finasteride  5 mg Oral Daily  . insulin aspart  0-15 Units Subcutaneous TID WC  . insulin aspart  0-5 Units Subcutaneous QHS  . insulin glargine  20 Units Subcutaneous QHS  . losartan  100 mg Oral Daily  . memantine  10 mg Oral BID  . metoprolol tartrate  50 mg Oral BID  . mometasone-formoterol  2 puff Inhalation BID  . pantoprazole  40 mg Oral Daily  . QUEtiapine  25 mg Oral BID  . tamsulosin  0.4 mg Oral QHS  . temazepam  15 mg Oral QHS  . venlafaxine  75 mg Oral Q supper   Continuous Infusions:    LOS: 1 day  Time spent: 35 min    Jeralyn Bennett, MD [facility] Pager [phone]  If 7PM-7AM, please contact night-coverage [email or web address] Password [email or web address] 02/07/2016, 1:10 PM

## 2016-02-08 ENCOUNTER — Inpatient Hospital Stay (HOSPITAL_COMMUNITY): Payer: Medicare Other

## 2016-02-08 DIAGNOSIS — I10 Essential (primary) hypertension: Secondary | ICD-10-CM

## 2016-02-08 DIAGNOSIS — I495 Sick sinus syndrome: Secondary | ICD-10-CM

## 2016-02-08 LAB — BASIC METABOLIC PANEL
Anion gap: 7 (ref 5–15)
BUN: 14 mg/dL (ref 6–20)
CHLORIDE: 107 mmol/L (ref 101–111)
CO2: 29 mmol/L (ref 22–32)
Calcium: 8.5 mg/dL — ABNORMAL LOW (ref 8.9–10.3)
Creatinine, Ser: 0.71 mg/dL (ref 0.61–1.24)
GFR calc Af Amer: >60 mL/min (ref >60)
GLUCOSE: 109 mg/dL — AB (ref 65–99)
POTASSIUM: 3.5 mmol/L (ref 3.5–5.1)
Sodium: 143 mmol/L (ref 135–145)

## 2016-02-08 LAB — GLUCOSE, CAPILLARY
GLUCOSE-CAPILLARY: 106 mg/dL — AB (ref 65–99)
GLUCOSE-CAPILLARY: 98 mg/dL (ref 65–99)
GLUCOSE-CAPILLARY: 99 mg/dL (ref 65–99)
GLUCOSE-CAPILLARY: 108 mg/dL — AB (ref 65–99)

## 2016-02-08 LAB — PROTIME-INR
INR: 1.15
Prothrombin Time: 14.8 seconds (ref 11.4–15.2)

## 2016-02-08 LAB — CBC
HEMATOCRIT: 40.5 % (ref 39.0–52.0)
Hemoglobin: 12.8 g/dL — ABNORMAL LOW (ref 13.0–17.0)
MCH: 28.5 pg (ref 26.0–34.0)
MCHC: 31.6 g/dL (ref 30.0–36.0)
MCV: 90.2 fL (ref 78.0–100.0)
PLATELETS: 305 10*3/uL (ref 150–400)
RBC: 4.49 MIL/uL (ref 4.22–5.81)
RDW: 14.4 % (ref 11.5–15.5)
WBC: 9.3 10*3/uL (ref 4.0–10.5)

## 2016-02-08 MED ORDER — POLYETHYLENE GLYCOL 3350 17 G PO PACK
17.0000 g | PACK | Freq: Every day | ORAL | Status: DC
Start: 2016-02-08 — End: 2016-02-10
  Administered 2016-02-08 – 2016-02-10 (×3): 17 g via ORAL
  Filled 2016-02-08 (×3): qty 1

## 2016-02-08 MED ORDER — SODIUM CHLORIDE 0.9 % IV BOLUS (SEPSIS)
500.0000 mL | Freq: Once | INTRAVENOUS | Status: AC
  Administered 2016-02-08: 500 mL via INTRAVENOUS

## 2016-02-08 MED ORDER — DOCUSATE SODIUM 100 MG PO CAPS
100.0000 mg | ORAL_CAPSULE | Freq: Two times a day (BID) | ORAL | Status: DC
  Administered 2016-02-08 – 2016-02-10 (×4): 100 mg via ORAL
  Filled 2016-02-08 (×4): qty 1

## 2016-02-08 NOTE — Progress Notes (Signed)
Ambulated patient to nurses station and back. After returning O2 was around 88-90% on RA. Place O2 back on patient at 2LPM and will continue to try to wean. Madelin RearLonnie Dalvin Clipper, MSN, RN, [employer]RN

## 2016-02-08 NOTE — Progress Notes (Signed)
PROGRESS NOTE    Adam PasseCharles D Popper, [name]  ZOX:[id number] DOB: 1934/09/20 DOA: 02/05/2016 PCP: Adam FillersPATERSON,DANIEL G, MD   Brief Narrative:  Adam [name] is a pleasant 81 year old gentleman having history of GERD about replacement with porcine valve, atrial fibrillation, on chronic anticoagulation with Coumadin, admitted to the medicine service on 02/06/2016 when he presented as a transfer from his skilled nursing facility. He had a mechanical fall at a skilled nursing facility with CT imaging of head showing 1.6 cm intraparenchymal hemorrhage. Case was discussed with Dr Bevely Palmeritty of neurosurgery who did not recommend surgical intervention. He was administered vitamin K and Percell LocusLcentra has initial lab work revealed INR of 3.56.    Assessment & Plan:   Principal Problem:   Intracranial hemorrhage (HCC) Active Problems:   Diabetes mellitus (HCC)   Essential hypertension   S/P CABG x 5   H/O mechanical aortic valve replacement   Vascular dementia   Hypokalemia   Carotid stenosis   Sick sinus syndrome (HCC)  Intracranial hemorrhage -Adam [name] is a 81 year old gentleman with a past medical history of dementia, anticoagulated with warfarin, having a fall at a skilled nursing facility resulting in intracranial hemorrhage. He was found to have an INR of 3.56 on admission. -CT scan showing 1.6 cm parenchymal hemorrhage -Neurosurgery consulted recommended nonoperative management -Anticoagulation has been discontinued and reversed  History of bioprosthetic valve replacement -Patient diagnosed with severe aortic stenosis undergoing bioprosthetic valve replacement in 2014   History of paroxysmal atrial fibrillation -He had been anticoagulated with warfarin for this, having a fall at his nursing home that resulted in intraparenchymal hemorrhage -Having advanced dementia, poor functional status, active head bleed, poor candidate for future anticoagulation as I believe the risks outweigh the benefits in his  case.  Advanced dementia with behavioral changes -Over the course of the day he has experienced agitation -On Namenda 10 mg by mouth twice a day -On 02/07/2016 continues to have acute agitation, have scheduled Seroquel 25 mg by mouth twice a day. Spoke with RN possible pain may be a contributor factor, continue as needed oxycodone 5 mg every 4 hours. Would avoid benzodiazepine therapy.  -On 02/08/2016 he was ambulated down the hallway, did well. Overall agitation seems to be improving.   Hypertension -Continue losartan 100 mg by mouth daily and metoprolol 25 mg by mouth twice a day  Insulin-dependent diabetes mellitus -Continue Lantus 20 units subcutaneous at bedtime with status sliding scale coverage  Hypokalemia -Presented with potassium of 2.6, repeat potassium 2.9 after IV replacement -Potassium improved to 3.8 on a.m. lab work after receiving replacement  Hyperthyroidism -Labs showing TSH of 0.394 with Free T4 of 1.4 -Case discussed with Dr Lanna PocheElison of [facility]in[facility] who recommended methimazole 10 mg PO q daily -He will see him in the outpatient setting  DVT prophylaxis: SCDs Code Status: DO NOT RESUSCITATE Family Communication: Spoke with his daughter and wife on 02/07/2016 Disposition Plan: Anticipate discharge back to SNF in the next 24 hours  Consultants:   Neurosurgery  Procedures:     Antimicrobials:     Subjective: He was ambulated to the nurses station today, seems to be doing better  Objective: Vitals:   02/07/16 1400 02/07/16 1809 02/07/16 2142 02/08/16 0625  BP: (!) 148/66 135/67 (!) 161/74 (!) 131/56  Pulse: (!) 106 100 (!) 103 64  Resp: (!) 25 18 19 18   Temp: 100.1 F (37.8 C) 98 F (36.7 C) 99.1 F (37.3 C) 97.2 F (36.2 C)  TempSrc: Axillary   Tympanic  SpO2: 94% 100% 95% 100%  Weight:      Height:        Intake/Output Summary (Last 24 hours) at 02/08/16 1426 Last data filed at 02/08/16 1250  Gross per 24 hour  Intake               240 ml  Output              450 ml  Net             -210 ml   Filed Weights   02/05/16 2311 02/06/16 0800 02/06/16 2238  Weight: 71 kg (156 lb 8.4 oz) 69.1 kg (152 lb 5.4 oz) 69.7 kg (153 lb 10.6 oz)    Examination:  General exam: Assisted out of bed, ambulating  Respiratory system: Clear to auscultation. Respiratory effort normal. Cardiovascular system: Tachycardic, irregular rate and rhythm No JVD, murmurs, rubs, gallops or clicks. No pedal edema. Gastrointestinal system: Abdomen is nondistended, soft and nontender. No organomegaly or masses felt. Normal bowel sounds heard. Central nervous system: Alert and oriented. No focal neurological deficits. Extremities: Symmetric 5 x 5 power. Skin: No rashes, lesions or ulcers Psychiatry: Agitated, confused, disoriented    Data Reviewed: I have personally reviewed following labs and imaging studies  CBC:  Recent Labs Lab 02/05/16 2332 02/07/16 0545 02/08/16 0550  WBC 6.5 8.6 9.3  NEUTROABS 4.1  --   --   HGB 13.3 12.7* 12.8*  HCT 41.0 40.4 40.5  MCV 87.4 88.4 90.2  PLT 336 338 305   Basic Metabolic Panel:  Recent Labs Lab 02/05/16 2332 02/06/16 1047 02/07/16 0545 02/08/16 0550  NA 139 141 143 143  K 2.6* 2.9* 3.8 3.5  CL 103 105 106 107  CO2 31 24 28 29   GLUCOSE 148* 159* 115* 109*  BUN 10 6 11 14   CREATININE 0.73 0.67 0.65 0.71  CALCIUM 8.7* 8.8* 8.7* 8.5*  MG 1.8  --   --   --    GFR: Estimated Creatinine Clearance: 67.6 mL/min (by C-[name] formula based on SCr of 0.8 mg/dL). Liver Function Tests:  Recent Labs Lab 02/05/16 2332  AST 19  ALT 14*  ALKPHOS 68  BILITOT 0.6  PROT 6.5  ALBUMIN 3.3*   No results for input(s): LIPASE, AMYLASE in the last 168 hours. No results for input(s): AMMONIA in the last 168 hours. Coagulation Profile:  Recent Labs Lab 02/05/16 2332 02/06/16 0218 02/08/16 0550  INR 3.56 1.37 1.15   Cardiac Enzymes: No results for input(s): CKTOTAL, CKMB, CKMBINDEX, TROPONINI in the  last 168 hours. BNP (last 3 results) No results for input(s): PROBNP in the last 8760 hours. HbA1C: No results for input(s): HGBA1C in the last 72 hours. CBG:  Recent Labs Lab 02/07/16 0823 02/07/16 1639 02/07/16 2034 02/08/16 0729 02/08/16 1142  GLUCAP 111* 132* 135* 106* 98   Lipid Profile: No results for input(s): CHOL, HDL, LDLCALC, TRIG, CHOLHDL, LDLDIRECT in the last 72 hours. Thyroid Function Tests:  Recent Labs  02/07/16 0545  TSH 0.394  FREET4 1.40*   Anemia Panel: No results for input(s): VITAMINB12, FOLATE, FERRITIN, TIBC, IRON, RETICCTPCT in the last 72 hours. Sepsis Labs: No results for input(s): PROCALCITON, LATICACIDVEN in the last 168 hours.  Recent Results (from the past 240 hour(s))  MRSA PCR Screening     Status: None   Collection Time: 02/06/16  6:47 AM  Result Value Ref Range Status   MRSA by PCR NEGATIVE NEGATIVE Final    Comment:  The GeneXpert MRSA Assay (FDA approved for NASAL specimens only), is one component of a comprehensive MRSA colonization surveillance program. It is not intended to diagnose MRSA infection nor to guide or monitor treatment for MRSA infections.   Urine culture     Status: None   Collection Time: 02/06/16  9:44 AM  Result Value Ref Range Status   Specimen Description URINE, CATHETERIZED  Final   Special Requests NONE  Final   Culture NO GROWTH  Final   Report Status 02/07/2016 FINAL  Final         Radiology Studies: Dg Abd Portable 1v  Result Date: 02/08/2016 CLINICAL DATA:  Two day history of abdominal pain EXAM: PORTABLE ABDOMEN - 1 VIEW COMPARISON:  CT abdomen and pelvis January 12, 2016 FINDINGS: There is mild stool in the colon. The bowel gas pattern is unremarkable. No bowel dilatation or air-fluid level to suggest obstruction. No free air. There are phleboliths in the pelvis. IMPRESSION: Bowel gas pattern unremarkable.  No obstruction or free air evident. Electronically Signed   By: Bretta Bang III M.D.   On: 02/08/2016 11:47        Scheduled Meds: . acetaminophen  1,000 mg Oral TID  . cilostazol  50 mg Oral BID  . dicyclomine  20 mg Oral Q6H  . docusate sodium  100 mg Oral BID  . finasteride  5 mg Oral Daily  . insulin aspart  0-15 Units Subcutaneous TID WC  . insulin aspart  0-5 Units Subcutaneous QHS  . insulin glargine  20 Units Subcutaneous QHS  . losartan  100 mg Oral Daily  . memantine  10 mg Oral BID  . methimazole  10 mg Oral Daily  . metoprolol tartrate  50 mg Oral BID  . mometasone-formoterol  2 puff Inhalation BID  . pantoprazole  40 mg Oral Daily  . polyethylene glycol  17 [name] Oral Daily  . QUEtiapine  25 mg Oral BID  . tamsulosin  0.4 mg Oral QHS  . temazepam  15 mg Oral QHS  . venlafaxine  75 mg Oral Q supper   Continuous Infusions:    LOS: 2 days    Time spent: 25 min    Jeralyn Bennett, MD [facility] Pager [phone]  If 7PM-7AM, please contact night-coverage [email or web address] Password [email or web address] 02/08/2016, 2:26 PM

## 2016-02-09 LAB — GLUCOSE, CAPILLARY
Glucose-Capillary: 108 mg/dL — ABNORMAL HIGH (ref 65–99)
Glucose-Capillary: 97 mg/dL (ref 65–99)
Glucose-Capillary: 202 mg/dL — ABNORMAL HIGH (ref 65–99)

## 2016-02-09 LAB — PROTIME-INR
INR: 1.16
PROTHROMBIN TIME: 14.8 s (ref 11.4–15.2)

## 2016-02-09 MED ORDER — SODIUM CHLORIDE 0.9 % IV BOLUS (SEPSIS)
500.0000 mL | Freq: Once | INTRAVENOUS | Status: AC
Start: 2016-02-09 — End: 2016-02-09
  Administered 2016-02-09: 500 mL via INTRAVENOUS

## 2016-02-09 MED ORDER — BISACODYL 10 MG RE SUPP
10.0000 mg | Freq: Once | RECTAL | Status: AC
  Administered 2016-02-09: 10 mg via RECTAL
  Filled 2016-02-09: qty 1

## 2016-02-09 MED ORDER — OXYCODONE HCL 5 MG PO TABS
5.0000 mg | ORAL_TABLET | Freq: Four times a day (QID) | ORAL | Status: DC | PRN
  Administered 2016-02-09 – 2016-02-10 (×3): 5 mg via ORAL
  Filled 2016-02-09 (×3): qty 1

## 2016-02-09 NOTE — Progress Notes (Signed)
PROGRESS NOTE    Adam PasseCharles D Minasyan, [name]  WGN:[id number] DOB: 1934-10-28 DOA: 02/05/2016 PCP: Garlan FillersPATERSON,Adam G, MD   Brief Narrative:  Adam [name] is a pleasant 81 year old gentleman having history of GERD about replacement with porcine valve, atrial fibrillation, on chronic anticoagulation with Coumadin, admitted to the medicine service on 02/06/2016 when he presented as a transfer from his skilled nursing facility. He had a mechanical fall at a skilled nursing facility with CT imaging of head showing 1.6 cm intraparenchymal hemorrhage. Case was discussed with Dr Bevely Palmeritty of neurosurgery who did not recommend surgical intervention. He was administered vitamin K and Percell LocusLcentra has initial lab work revealed INR of 3.56.    Assessment & Plan:   Principal Problem:   Intracranial hemorrhage (HCC) Active Problems:   Diabetes mellitus (HCC)   Essential hypertension   S/P CABG x 5   H/O mechanical aortic valve replacement   Vascular dementia   Hypokalemia   Carotid stenosis   Sick sinus syndrome (HCC)  Intracranial hemorrhage -Adam [name] is a 81 year old gentleman with a past medical history of dementia, anticoagulated with warfarin, having a fall at a skilled nursing facility resulting in intracranial hemorrhage. He was found to have an INR of 3.56 on admission. -CT scan showing 1.6 cm parenchymal hemorrhage -Neurosurgery consulted recommended nonoperative management -Anticoagulation has been discontinued and reversed   History of bioprosthetic valve replacement -Patient diagnosed with severe aortic stenosis undergoing bioprosthetic valve replacement in 2014   History of paroxysmal atrial fibrillation -He had been anticoagulated with warfarin for this, having a fall at his nursing home that resulted in intraparenchymal hemorrhage -Having advanced dementia, poor functional status, active head bleed, poor candidate for future anticoagulation as I believe the risks outweigh the benefits in his  case.  Advanced dementia with behavioral changes -Over the course of the day he has experienced agitation -On Namenda 10 mg by mouth twice a day -On 02/07/2016 continues to have acute agitation, have scheduled Seroquel 25 mg by mouth twice a day. Spoke with RN possible pain may be a contributor factor, continue as needed oxycodone 5 mg every 4 hours. Would avoid benzodiazepine therapy.  -On 02/08/2016 he was ambulated down the hallway, did well. Overall agitation seems to be improving.  -02/09/2016 there has been improvement with his agitation over the last 24 hours, I think pain may had contributed. He's doing better with oxycodone.   Hypertension -Continue losartan 100 mg by mouth daily and metoprolol 25 mg by mouth twice a day  Insulin-dependent diabetes mellitus -Continue Lantus 20 units subcutaneous at bedtime with status sliding scale coverage  Hypokalemia -Presented with potassium of 2.6, repeat potassium 2.9 after IV replacement -Potassium improved to 3.8 on a.m. lab work after receiving replacement  Hyperthyroidism -Labs showing TSH of 0.394 with Free T4 of 1.4 -Case discussed with [facility]Elison [facility] who recommended methimazole 10 mg PO q daily -He will see him in the outpatient setting  DVT prophylaxis: SCDs Code Status: DO NOT RESUSCITATE Family Communication: Spoke with his wife on 02/09/2016 Disposition Plan: Anticipate discharge back to SNF in the next 24 hours  Consultants:   Neurosurgery  Procedures:     Antimicrobials:     Subjective: He was ambulated to the nurses station today, seems to be doing better  Objective: Vitals:   02/08/16 2122 02/09/16 0518 02/09/16 0945 02/09/16 0950  BP: (!) 117/47 (!) 109/42    Pulse: 71 78    Resp: 18 18    Temp: 98.1 F (  36.7 C) 97.7 F (36.5 C)    TempSrc:      SpO2: 99% 98% (!) 80% 91%  Weight:      Height:        Intake/Output Summary (Last 24 hours) at 02/09/16 1323 Last data filed at  02/09/16 1015  Gross per 24 hour  Intake                0 ml  Output              625 ml  Net             -625 ml   Filed Weights   02/05/16 2311 02/06/16 0800 02/06/16 2238  Weight: 71 kg (156 lb 8.4 oz) 69.1 kg (152 lb 5.4 oz) 69.7 kg (153 lb 10.6 oz)    Examination:  General exam: Assisted out of bed, ambulating  Respiratory system: Clear to auscultation. Respiratory effort normal. Cardiovascular system: Tachycardic, irregular rate and rhythm No JVD, murmurs, rubs, gallops or clicks. No pedal edema. Gastrointestinal system: Abdomen is nondistended, soft and nontender. No organomegaly or masses felt. Normal bowel sounds heard. Central nervous system: Alert and oriented. No focal neurological deficits. Extremities: Symmetric 5 x 5 power. Skin: No rashes, lesions or ulcers Psychiatry: Agitated, confused, disoriented    Data Reviewed: I have personally reviewed following labs and imaging studies  CBC:  Recent Labs Lab 02/05/16 2332 02/07/16 0545 02/08/16 0550  WBC 6.5 8.6 9.3  NEUTROABS 4.1  --   --   HGB 13.3 12.7* 12.8*  HCT 41.0 40.4 40.5  MCV 87.4 88.4 90.2  PLT 336 338 305   Basic Metabolic Panel:  Recent Labs Lab 02/05/16 2332 02/06/16 1047 02/07/16 0545 02/08/16 0550  NA 139 141 143 143  K 2.6* 2.9* 3.8 3.5  CL 103 105 106 107  CO2 31 24 28 29   GLUCOSE 148* 159* 115* 109*  BUN 10 6 11 14   CREATININE 0.73 0.67 0.65 0.71  CALCIUM 8.7* 8.8* 8.7* 8.5*  MG 1.8  --   --   --    GFR: Estimated Creatinine Clearance: 67.6 mL/min (by C-[name] formula based on SCr of 0.8 mg/dL). Liver Function Tests:  Recent Labs Lab 02/05/16 2332  AST 19  ALT 14*  ALKPHOS 68  BILITOT 0.6  PROT 6.5  ALBUMIN 3.3*   No results for input(s): LIPASE, AMYLASE in the last 168 hours. No results for input(s): AMMONIA in the last 168 hours. Coagulation Profile:  Recent Labs Lab 02/05/16 2332 02/06/16 0218 02/08/16 0550 02/09/16 0520  INR 3.56 1.37 1.15 1.16   Cardiac  Enzymes: No results for input(s): CKTOTAL, CKMB, CKMBINDEX, TROPONINI in the last 168 hours. BNP (last 3 results) No results for input(s): PROBNP in the last 8760 hours. HbA1C: No results for input(s): HGBA1C in the last 72 hours. CBG:  Recent Labs Lab 02/08/16 1142 02/08/16 1711 02/08/16 2119 02/09/16 0759 02/09/16 1148  GLUCAP 98 99 108* 108* 97   Lipid Profile: No results for input(s): CHOL, HDL, LDLCALC, TRIG, CHOLHDL, LDLDIRECT in the last 72 hours. Thyroid Function Tests:  Recent Labs  02/07/16 0545  TSH 0.394  FREET4 1.40*   Anemia Panel: No results for input(s): VITAMINB12, FOLATE, FERRITIN, TIBC, IRON, RETICCTPCT in the last 72 hours. Sepsis Labs: No results for input(s): PROCALCITON, LATICACIDVEN in the last 168 hours.  Recent Results (from the past 240 hour(s))  MRSA PCR Screening     Status: None   Collection Time: 02/06/16  6:47 AM  Result Value Ref Range Status   MRSA by PCR NEGATIVE NEGATIVE Final    Comment:        The GeneXpert MRSA Assay (FDA approved for NASAL specimens only), is one component of a comprehensive MRSA colonization surveillance program. It is not intended to diagnose MRSA infection nor to guide or monitor treatment for MRSA infections.   Urine culture     Status: None   Collection Time: 02/06/16  9:44 AM  Result Value Ref Range Status   Specimen Description URINE, CATHETERIZED  Final   Special Requests NONE  Final   Culture NO GROWTH  Final   Report Status 02/07/2016 FINAL  Final         Radiology Studies: Dg Abd Portable 1v  Result Date: 02/08/2016 CLINICAL DATA:  Two day history of abdominal pain EXAM: PORTABLE ABDOMEN - 1 VIEW COMPARISON:  CT abdomen and pelvis January 12, 2016 FINDINGS: There is mild stool in the colon. The bowel gas pattern is unremarkable. No bowel dilatation or air-fluid level to suggest obstruction. No free air. There are phleboliths in the pelvis. IMPRESSION: Bowel gas pattern unremarkable.  No  obstruction or free air evident. Electronically Signed   By: Bretta BangWilliam  Woodruff III M.D.   On: 02/08/2016 11:47        Scheduled Meds: . acetaminophen  1,000 mg Oral TID  . bisacodyl  10 mg Rectal Once  . cilostazol  50 mg Oral BID  . dicyclomine  20 mg Oral Q6H  . docusate sodium  100 mg Oral BID  . finasteride  5 mg Oral Daily  . insulin aspart  0-15 Units Subcutaneous TID WC  . insulin aspart  0-5 Units Subcutaneous QHS  . insulin glargine  20 Units Subcutaneous QHS  . losartan  100 mg Oral Daily  . memantine  10 mg Oral BID  . methimazole  10 mg Oral Daily  . metoprolol tartrate  50 mg Oral BID  . mometasone-formoterol  2 puff Inhalation BID  . pantoprazole  40 mg Oral Daily  . polyethylene glycol  17 [name] Oral Daily  . QUEtiapine  25 mg Oral BID  . sodium chloride  500 mL Intravenous Once  . tamsulosin  0.4 mg Oral QHS  . temazepam  15 mg Oral QHS  . venlafaxine  75 mg Oral Q supper   Continuous Infusions:    LOS: 3 days    Time spent: 15 min    Jeralyn BennettZAMORA, Stephanos Fan, MD [facility] Pager [phone]  If 7PM-7AM, please contact night-coverage [email or web address] Password [email or web address] 02/09/2016, 1:23 PM

## 2016-02-09 NOTE — Progress Notes (Signed)
Physical Therapy Treatment Patient Details Name: Adam PasseCharles D Bennie, [name] MRN: [id number] DOB: 1934/07/16 Today's Date: 02/09/2016    History of Present Illness 81 y.o. male [facility] revealed ICH of R medial temporal lobe. PMHx: pt with complex cardiac hx, CAD, CABG, s/p AVR, DDD, DM II, lumbar spinal stenosis, OSA, HTN, HLD, anxiety, Depression, Dementia.    PT Comments    Progressing slowly.  Today easily redirected and ambulated with min/mod assist 70 feet with wife present and assisting.  Follow Up Recommendations  SNF;Supervision/Assistance - 24 hour     Equipment Recommendations  None recommended by PT    Recommendations for Other Services       Precautions / Restrictions Precautions Precautions: Fall    Mobility  Bed Mobility Overal bed mobility: Needs Assistance Bed Mobility: Supine to Sit     Supine to sit: Min assist     General bed mobility comments: cuing for direction and assist to EOB  Transfers Overall transfer level: Needs assistance Equipment used: Rolling walker (2 wheeled) Transfers: Sit to/from Stand Sit to Stand: Min assist;Mod assist;+2 safety/equipment         General transfer comment: assist to come forward and boost.  General stability assist once upl;  Ambulation/Gait Ambulation/Gait assistance: Min assist;Mod assist;+2 safety/equipment Ambulation Distance (Feet): 60 Feet Assistive device: Rolling walker (2 wheeled) Gait Pattern/deviations: Step-through pattern Gait velocity: decreased Gait velocity interpretation: Below normal speed for age/gender General Gait Details: short choppy steps with little forward momentum.  Frequent cues to redirect to task.   Stairs            Wheelchair Mobility    Modified Rankin (Stroke Patients Only) Modified Rankin (Stroke Patients Only) Pre-Morbid Rankin Score: No symptoms Modified Rankin: Moderately severe disability     Balance Overall balance assessment: Needs  assistance Sitting-balance support: No upper extremity supported Sitting balance-Leahy Scale: Fair       Standing balance-Leahy Scale: Poor Standing balance comment: reliant on RW or assist                    Cognition Arousal/Alertness: Awake/alert Behavior During Therapy: WFL for tasks assessed/performed Overall Cognitive Status: Impaired/Different from baseline Area of Impairment: Orientation;Attention;Memory;Following commands;Safety/judgement;Awareness;Problem solving Orientation Level: Disoriented to;Time;Situation Current Attention Level: Focused Memory: Decreased short-term memory Following Commands: Follows one step commands inconsistently Safety/Judgement: Decreased awareness of safety;Decreased awareness of deficits Awareness: Intellectual Problem Solving: Slow processing;Decreased initiation;Difficulty sequencing;Requires verbal cues;Requires tactile cues      Exercises      General Comments        Pertinent Vitals/Pain Pain Assessment: Faces Faces Pain Scale: Hurts little more Pain Location: vague Pain Descriptors / Indicators: Grimacing;Moaning Pain Intervention(s): Monitored during session    Home Living                      Prior Function            PT Goals (current goals can now be found in the care plan section) Acute Rehab PT Goals PT Goal Formulation: With patient Time For Goal Achievement: 02/20/16 Potential to Achieve Goals: Good Progress towards PT goals: Progressing toward goals    Frequency  Min 2X/week    PT Plan Current plan remains appropriate    Co-evaluation             End of Session   Activity Tolerance: Patient tolerated treatment well Patient left: in chair;with call bell/phone within reach;with chair alarm  set;with family/visitor present     Time: 1157-1228 PT Time Calculation (min) (ACUTE ONLY): 31 min  Charges:  $Gait Training: 8-22 mins $Therapeutic Activity: 8-22 mins                     G Codes:      Barak Bialecki, Eliseo GumKenneth V 02/09/2016, 1:49 PM  02/09/2016  [place] BingKen Elouise Divelbiss, PT [phone]  (pager)

## 2016-02-10 DIAGNOSIS — F0151 Vascular dementia with behavioral disturbance: Secondary | ICD-10-CM

## 2016-02-10 LAB — GLUCOSE, CAPILLARY
Glucose-Capillary: 128 mg/dL — ABNORMAL HIGH (ref 65–99)
Glucose-Capillary: 117 mg/dL — ABNORMAL HIGH (ref 65–99)

## 2016-02-10 MED ORDER — METHIMAZOLE 10 MG PO TABS: 10.0000 mg | ORAL_TABLET | Freq: Every day | ORAL | 0 refills | Status: DC

## 2016-02-10 MED ORDER — OXYCODONE HCL 5 MG PO TABS: 5.0000 mg | ORAL_TABLET | Freq: Four times a day (QID) | ORAL | 0 refills | Status: AC | PRN

## 2016-02-10 MED ORDER — METOPROLOL TARTRATE 50 MG PO TABS: 50.0000 mg | ORAL_TABLET | Freq: Two times a day (BID) | ORAL | 0 refills | Status: DC

## 2016-02-10 MED ORDER — QUETIAPINE FUMARATE 25 MG PO TABS: 25.0000 mg | ORAL_TABLET | Freq: Two times a day (BID) | ORAL | 0 refills | Status: AC

## 2016-02-10 NOTE — Progress Notes (Signed)
Physical Therapy Treatment Patient Details Name: Adam PasseCharles D Tejada, [name] MRN: [id number] DOB: Jul 28, 1934 Today's Date: 02/10/2016    History of Present Illness 81 [facility]. [facility] revealed ICH of R medial temporal lobe. PMHx: pt with complex cardiac hx, CAD, CABG, s/p AVR, DDD, DM II, lumbar spinal stenosis, OSA, HTN, HLD, anxiety, Depression, Dementia.    PT Comments    Unable to keep pt focused on task today, plus he might have been tired from earlier mobilization.   Follow Up Recommendations  SNF;Other (comment) (with switch over to restorative nursing)     Equipment Recommendations  None recommended by PT    Recommendations for Other Services       Precautions / Restrictions Precautions Precautions: Fall Restrictions Weight Bearing Restrictions: No    Mobility  Bed Mobility               General bed mobility comments: up in a recliner on arrival  Transfers Overall transfer level: Needs assistance Equipment used: Rolling walker (2 wheeled) Transfers: Sit to/from Stand Sit to Stand: Mod assist;+2 safety/equipment         General transfer comment: assist to come forward and boost.  General stability assist once upl;  Ambulation/Gait Ambulation/Gait assistance: Mod assist;+2 safety/equipment Ambulation Distance (Feet): 22 Feet Assistive device: Rolling walker (2 wheeled) Gait Pattern/deviations: Step-through pattern Gait velocity: decreased Gait velocity interpretation: Below normal speed for age/gender General Gait Details: Unable to get pt to continue walking.  Frequent stopping with difficulty getting him to start back.   Stairs            Wheelchair Mobility    Modified Rankin (Stroke Patients Only) Modified Rankin (Stroke Patients Only) Pre-Morbid Rankin Score: No symptoms Modified Rankin: Moderately severe disability     Balance Overall balance assessment: Needs assistance   Sitting balance-Leahy Scale: Fair       Standing balance-Leahy Scale: Poor Standing balance comment: highly reliant on the UE's                    Cognition Arousal/Alertness: Awake/alert Behavior During Therapy: WFL for tasks assessed/performed Overall Cognitive Status: Impaired/Different from baseline Area of Impairment: Orientation;Attention;Memory;Following commands;Safety/judgement;Awareness;Problem solving Orientation Level: Disoriented to;Time;Situation Current Attention Level: Focused Memory: Decreased short-term memory Following Commands: Follows one step commands inconsistently Safety/Judgement: Decreased awareness of safety;Decreased awareness of deficits Awareness: Intellectual Problem Solving: Slow processing;Decreased initiation;Difficulty sequencing;Requires verbal cues;Requires tactile cues      Exercises      General Comments        Pertinent Vitals/Pain Pain Assessment: Faces Faces Pain Scale: Hurts a little bit Pain Location: vague Pain Descriptors / Indicators: Grimacing;Moaning Pain Intervention(s): Monitored during session    Home Living                      Prior Function            PT Goals (current goals can now be found in the care plan section) Acute Rehab PT Goals Patient Stated Goal: get back to bed PT Goal Formulation: With patient Time For Goal Achievement: 02/20/16 Potential to Achieve Goals: Good Progress towards PT goals: Progressing toward goals    Frequency  Min 2X/week    PT Plan Current plan remains appropriate    Co-evaluation             End of Session   Activity Tolerance: Patient tolerated treatment well Patient left: in chair;with call bell/phone within  reach;with chair alarm set;with family/visitor present     Time: [id number] PT Time Calculation (min) (ACUTE ONLY): 41 min  Charges:  $Gait Training: 8-22 mins $Therapeutic Activity: 8-22 mins $Self Care/Home Management: 8-22                    G Codes:      Marchella Hibbard,  Eliseo GumKenneth V 02/10/2016, 1:12 PM 02/10/2016  [place]ett BingKen Michon Kaczmarek, PT [phone]  (pager)

## 2016-02-10 NOTE — Plan of Care (Signed)
Problem: Nutrition: Goal: Adequate nutrition will be maintained Outcome: Progressing Discussed with patient importance of hydration and standing to urinate with some teach back dispalyed.

## 2016-02-10 NOTE — Progress Notes (Signed)
[facility] Anticipated discharge date: 8/21 Family notified: [facility]  CSW signing off.  Burna SisJenna H. Jacqueline Delapena, LCSWA Clinical Social Worker [phone]

## 2016-02-10 NOTE — [place] FL2 (Signed)
[place] MEDICAID FL2 LEVEL OF CARE SCREENING TOOL     IDENTIFICATION  Patient Name: Adam Passeharles D Kallal, [name] Birthdate: 1934-10-26 Sex: male Admission Date (Current Location): 02/05/2016  [facility] [place]   Facility and Address:  [facility] [place], [address], [place], [place] [address]      Provider Number: [id number]  Attending Physician Name and Address:  Jeralyn BennettEzequiel Zamora, MD  Relative Name and Phone Number:  Adam MartinezLynnette [name] - wife.  Phone #[phone] (mobile) and [phone] (home)    Current Level of Care: Hospital Recommended Level of Care: Skilled Nursing Facility Prior Approval Number:    Date Approved/Denied:   PASRR Number:    Discharge Plan: SNF    Current Diagnoses: Patient Active Problem List   Diagnosis Date Noted  . Sick sinus syndrome (HCC)   . Intracranial hemorrhage (HCC) 02/06/2016  . Hypokalemia 02/06/2016  . Carotid stenosis 02/06/2016  . Hyperthyroidism   . Altered mental status 10/28/2015  . Acute encephalopathy 10/28/2015  . Supratherapeutic INR 10/28/2015  . Fall 10/28/2015  . Mobitz type II atrioventricular block 08/21/2015  . Complete heart block (HCC) 08/21/2015  . Syncope 08/14/2015  . Vascular dementia 08/12/2015  . Memory loss 10/11/2014  . CAP (community acquired pneumonia) 08/28/2014  . OSA on CPAP 10/10/2013  . Hypersomnia with sleep apnea 10/10/2013  . Bifascicular block 08/03/2013  . Encounter for therapeutic drug monitoring 07/20/2013  . Insomnia, persistent 05/29/2013  . Unspecified sleep apnea 05/29/2013  . 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 OSA (obstructive sleep apnea)   . H/O mechanical aortic valve replacement 09/08/2012  . Chronic anticoagulation 09/08/2012  . S/P CABG x 5 08/19/2012  . Atrial tachycardia (HCC) 04/25/2012  . Fall from furniture 03/30/2011  . CAD (coronary artery disease) 09/30/2010  . Atrial flutter (HCC) 09/30/2010  .  Aortic stenosis 09/30/2010  . Obese 09/30/2010  . Paroxysmal atrial fibrillation (HCC) 09/30/2010  . DYSPNEA 12/25/2007  . Diabetes mellitus (HCC) 11/16/2007  . HYPERLIPIDEMIA 11/16/2007  . Essential hypertension 11/16/2007  . COPD (chronic obstructive pulmonary disease) (HCC) 11/16/2007    Orientation RESPIRATION BLADDER Height & Weight     Self  O2 (3L [facility]) Incontinent Weight: 69.7 kg (153 lb 10.6 oz) Height:  5\' 6"  (167.6 cm)  BEHAVIORAL SYMPTOMS/MOOD NEUROLOGICAL BOWEL NUTRITION STATUS      Incontinent Diet (carb modified)  AMBULATORY STATUS COMMUNICATION OF NEEDS Skin   Limited Assist Verbally Normal                       Personal Care Assistance Level of Assistance  Bathing, Dressing Bathing Assistance: Limited assistance   Dressing Assistance: Limited assistance     Functional Limitations Info             SPECIAL CARE FACTORS FREQUENCY  PT (By licensed PT), OT (By licensed OT)     PT Frequency: 5/wk OT Frequency: 5/wk            Contractures      Additional Factors Info  Code Status, Allergies, Psychotropic, Insulin Sliding Scale Code Status Info: DNR Allergies Info: Doxycycline Psychotropic Info: seroquel Insulin Sliding Scale Info: 4/day       Current Medications (02/10/2016):  This is the current hospital active medication list Current Facility-Administered Medications  Medication Dose Route Frequency Provider Last Rate Last Dose  . acetaminophen (TYLENOL) tablet 650 mg  650 mg Oral Q4H PRN Jonah BlueJennifer Yates, MD  Or  . acetaminophen (TYLENOL) suppository 650 mg  650 mg Rectal Q4H PRN Jonah BlueJennifer Yates, MD      . acetaminophen (TYLENOL) tablet 1,000 mg  1,000 mg Oral TID Jonah BlueJennifer Yates, MD   1,000 mg at 02/10/16 0553  . albuterol (PROVENTIL) (2.5 MG/3ML) 0.083% nebulizer solution 2.5 mg  2.5 mg Nebulization Q2H PRN Jonah BlueJennifer Yates, MD      . cilostazol (PLETAL) tablet 50 mg  50 mg Oral BID Jonah BlueJennifer Yates, MD   50 mg at 02/09/16 2212  .  dicyclomine (BENTYL) tablet 20 mg  20 mg Oral Q6H Jonah BlueJennifer Yates, MD   20 mg at 02/10/16 0600  . docusate sodium (COLACE) capsule 100 mg  100 mg Oral BID Jeralyn BennettEzequiel Zamora, MD   100 mg at 02/09/16 2214  . finasteride (PROSCAR) tablet 5 mg  5 mg Oral Daily Jonah BlueJennifer Yates, MD   5 mg at 02/09/16 0951  . insulin aspart (novoLOG) injection 0-15 Units  0-15 Units Subcutaneous TID WC Jonah BlueJennifer Yates, MD   2 Units at 02/07/16 1656  . insulin aspart (novoLOG) injection 0-5 Units  0-5 Units Subcutaneous QHS Jonah BlueJennifer Yates, MD   2 Units at 02/09/16 2212  . insulin glargine (LANTUS) injection 20 Units  20 Units Subcutaneous QHS Jonah BlueJennifer Yates, MD   20 Units at 02/09/16 2211  . losartan (COZAAR) tablet 100 mg  100 mg Oral Daily Jonah BlueJennifer Yates, MD   100 mg at 02/09/16 [id number]  . memantine (NAMENDA) tablet 10 mg  10 mg Oral BID Jonah BlueJennifer Yates, MD   10 mg at 02/09/16 2213  . methimazole (TAPAZOLE) tablet 10 mg  10 mg Oral Daily Jeralyn BennettEzequiel Zamora, MD   10 mg at 02/09/16 0955  . metoprolol (LOPRESSOR) tablet 50 mg  50 mg Oral BID Wendall StadePeter C Nishan, MD   50 mg at 02/09/16 2213  . mometasone-formoterol (DULERA) 200-5 MCG/ACT inhaler 2 puff  2 puff Inhalation BID Jonah BlueJennifer Yates, MD   2 puff at 02/09/16 2027  . morphine 2 MG/ML injection 2 mg  2 mg Intravenous Q3H PRN Elson AreasLeslie K Sofia, PA-C   2 mg at 02/09/16 1626  . oxyCODONE (Oxy IR/ROXICODONE) immediate release tablet 5 mg  5 mg Oral Q6H PRN Jeralyn BennettEzequiel Zamora, MD   5 mg at 02/09/16 2213  . pantoprazole (PROTONIX) EC tablet 40 mg  40 mg Oral Daily Jonah BlueJennifer Yates, MD   40 mg at 02/09/16 0951  . polyethylene glycol (MIRALAX / GLYCOLAX) packet 17 g  17 g Oral Daily Jeralyn BennettEzequiel Zamora, MD   17 g at 02/09/16 0951  . QUEtiapine (SEROQUEL) tablet 25 mg  25 mg Oral BID Jeralyn BennettEzequiel Zamora, MD   25 mg at 02/09/16 2214  . senna-docusate (Senokot-S) tablet 2 tablet  2 tablet Oral BID PRN Jonah BlueJennifer Yates, MD      . tamsulosin [facility](FLOMAX) capsule 0.4 mg  0.4 mg Oral QHS Jonah BlueJennifer Yates, MD   0.4 mg at  02/09/16 2214  . temazepam (RESTORIL) capsule 15 mg  15 mg Oral QHS Jonah BlueJennifer Yates, MD   15 mg at 02/09/16 2214  . venlafaxine (EFFEXOR) tablet 75 mg  75 mg Oral Q supper Jonah BlueJennifer Yates, MD   75 mg at 02/09/16 1843     Discharge Medications: Please see discharge summary for a list of discharge medications.  Relevant Imaging Results:  Relevant Lab Results:   Additional Information SSN: [id number]  Burna SisUris, Moses Odoherty H, LCSW

## 2016-02-10 NOTE — Discharge Summary (Signed)
Physician Discharge Summary  Adam Passe, [name] FAO:[id number] DOB: 1934-07-05 DOA: 02/05/2016  PCP: Garlan Fillers, MD  Admit date: 02/05/2016 Discharge date: 02/10/2016  Time spent: 35 minutes  Recommendations for Outpatient Follow-up:  1. [name] having a history of advanced dementia with behavioral disturbances, during this hospitalization he complained of pain and his agitation significantly improved with administration of oxycodone. Would recommend Oxycodone every 6 hours as needed with discontinuation of benzodiazepines.   2. ASA and Warfarin were stopped secondary to intracranial bleed s/p fall  3. Having elevated Free T4 of 1.4 with TSH of 0.394, case discussed with Dr Everardo All of Endocrinology who recommended starting Methimazole. Please have him follow up with Dr Everardo All at [facility] in 1-2 weeks. Will need a repeat TSH and FT4 in 3-4 weeks   Discharge Diagnoses:  Principal Problem:   Intracranial hemorrhage (HCC) Active Problems:   Diabetes mellitus (HCC)   Essential hypertension   S/P CABG x 5   H/O mechanical aortic valve replacement   Vascular dementia   Hypokalemia   Carotid stenosis   Sick sinus syndrome [facility])   Discharge Condition: Stable  Diet recommendation: Soft diet  Filed Weights   02/05/16 2311 02/06/16 0800 02/06/16 2238  Weight: 71 kg (156 lb 8.4 oz) 69.1 kg (152 lb 5.4 oz) 69.7 kg (153 lb 10.6 oz)    History of present illness:  Adam Passe, [name] is a 81 y.o. male with medical history significant of aortic valve replacement (porcine) on Coumadin, dementia, back pain presenting to the ER after a fall at [facility].  Patient's wife provided the history and reports that she was called by the SNF with notification that he was being brought to the ER. She was with him earlier in the evening and he received Ativan and was lethargic tonight. He usually manages fine so this was unusual.  He was in bed asleep when wife left him. He evidently got up  to go to the bathroom, picked up his walker and lost balance and hit his head on the wall. He did land in a chair but did not hit the floor.  Has occipital headache. No vision changes.  No injuries since he fell.  Hospital Course:  [name] is a pleasant 81 year old gentleman having history of GERD about replacement with porcine valve, atrial fibrillation, on chronic anticoagulation with Coumadin, admitted to the medicine service on 02/06/2016 when he presented as a transfer from his skilled nursing facility. He had a mechanical fall at a skilled nursing facility with CT imaging of head showing 1.6 cm intraparenchymal hemorrhage. Case was discussed with Dr Bevely Palmer of neurosurgery who did not recommend surgical intervention. He was administered vitamin K and Percell Locus has initial lab work revealed INR of 3.56.   Intracranial hemorrhage -[name] is a 81 year old gentleman with a past medical history of dementia, anticoagulated with warfarin, having a fall at a skilled nursing facility resulting in intracranial hemorrhage. He was found to have an INR of 3.56 on admission. -CT scan showing 1.6 cm parenchymal hemorrhage -Neurosurgery consulted recommended nonoperative management -Anticoagulation has been discontinued and reversed   History of bioprosthetic valve replacement -Patient diagnosed with severe aortic stenosis undergoing bioprosthetic valve replacement in 2014   History of paroxysmal atrial fibrillation -He had been anticoagulated with warfarin for this, having a fall at his nursing home that resulted in intraparenchymal hemorrhage -Having advanced dementia, poor functional status, active head bleed, poor candidate for future anticoagulation as I believe the risks outweigh  the benefits in his case.  Advanced dementia with behavioral changes -Over the course of the day he has experienced agitation -On Namenda 10 mg by mouth twice a day -On 02/07/2016 continues to have acute agitation, have  scheduled Seroquel 25 mg by mouth twice a day. Spoke with RN possible pain may be a contributor factor, continue as needed oxycodone 5 mg every 4 hours. Would avoid benzodiazepine therapy.  -On 02/08/2016 he was ambulated down the hallway, did well. Overall agitation seems to be improving.  -02/09/2016 there has been improvement with his agitation over the last 24 hours, I think pain may had contributed. He's doing better with oxycodone.   Hypertension -Continue losartan 100 mg by mouth daily and metoprolol 50 mg by mouth twice a day  Insulin-dependent diabetes mellitus -Continue Lantus 20 units subcutaneous at bedtime with status sliding scale coverage  Hypokalemia -Presented with potassium of 2.6, repeat potassium 2.9 after IV replacement -Potassium improved to 3.8 on a.m. lab work after receiving replacement  Hyperthyroidism -Labs showing TSH of 0.394 with Free T4 of 1.4 -Case discussed with Dr Lanna Poche of [facility] who recommended methimazole 10 mg PO q daily -He will see him in the outpatient setting   Consultations:  Neurosurgery  Cardiology  Discharge Exam: Vitals:   02/09/16 2121 02/10/16 0551  BP: (!) 124/48 (!) 159/60  Pulse: (!) 110 90  Resp: 18   Temp: 97.5 F (36.4 C) 98.5 F (36.9 C)   General exam: Assisted out of bed, ambulated around RN station Respiratory system: Clear to auscultation. Respiratory effort normal. Cardiovascular system: Tachycardic, irregular rate and rhythm No JVD, murmurs, rubs, gallops or clicks. No pedal edema. Gastrointestinal system: Abdomen is nondistended, soft and nontender. No organomegaly or masses felt. Normal bowel sounds heard. Central nervous system: Alert and oriented. No focal neurological deficits. Extremities: Symmetric 5 x 5 power. Skin: No rashes, lesions or ulcers Psychiatry: Agitated, confused, disoriented   Discharge Instructions   Discharge Instructions    Call MD for:    Complete by:  As directed    Call MD for:  difficulty breathing, headache or visual disturbances    Complete by:  As directed   Call MD for:  extreme fatigue    Complete by:  As directed   Call MD for:  hives    Complete by:  As directed   Call MD for:  persistant dizziness or light-headedness    Complete by:  As directed   Call MD for:  persistant nausea and vomiting    Complete by:  As directed   Call MD for:  redness, tenderness, or signs of infection (pain, swelling, redness, odor or green/yellow discharge around incision site)    Complete by:  As directed   Call MD for:  severe uncontrolled pain    Complete by:  As directed   Call MD for:  temperature >100.4    Complete by:  As directed   Diet - low sodium heart healthy    Complete by:  As directed   Increase activity slowly    Complete by:  As directed     Current Discharge Medication List    START taking these medications   Details  methimazole (TAPAZOLE) 10 MG tablet Take 1 tablet (10 mg total) by mouth daily. Qty: 30 tablet, Refills: 0    oxyCODONE (ROXICODONE) 5 MG immediate release tablet Take 1 tablet (5 mg total) by mouth every 6 (six) hours as needed for severe pain. Qty: 20 tablet,  Refills: 0    QUEtiapine (SEROQUEL) 25 MG tablet Take 1 tablet (25 mg total) by mouth 2 (two) times daily. Qty: 60 tablet, Refills: 0      CONTINUE these medications which have CHANGED   Details  metoprolol (LOPRESSOR) 50 MG tablet Take 1 tablet (50 mg total) by mouth 2 (two) times daily. Qty: 60 tablet, Refills: 0      CONTINUE these medications which have NOT CHANGED   Details  acetaminophen (TYLENOL) 500 MG tablet Take 2 tablets (1,000 mg total) by mouth 3 (three) times daily. Qty: 30 tablet, Refills: 1    albuterol (PROVENTIL HFA;VENTOLIN HFA) 108 (90 BASE) MCG/ACT inhaler Inhale 2 puffs into the lungs every 6 (six) hours as needed for wheezing. Qty: 1 Inhaler, Refills: 6    budesonide-formoterol (SYMBICORT) 160-4.5 MCG/ACT inhaler Inhale 2 puffs into the  lungs 2 (two) times daily. Qty: 1 Inhaler, Refills: 5    cilostazol (PLETAL) 50 MG tablet Take 50 mg by mouth 2 (two) times daily.    dicyclomine (BENTYL) 20 MG tablet Take 20 mg by mouth every 6 (six) hours. Refills: 10    finasteride (PROSCAR) 5 MG tablet Take 1 tablet (5 mg total) by mouth daily. Qty: 30 tablet, Refills: 1    insulin glargine (LANTUS) 100 unit/mL SOPN Inject 20 Units into the skin at bedtime.    losartan (COZAAR) 100 MG tablet Take 1 tablet (100 mg total) by mouth daily. Qty: 90 tablet, Refills: 3    memantine (NAMENDA) 10 MG tablet Take 1 tablet (10 mg total) by mouth 2 (two) times daily. Qty: 180 tablet, Refills: 1   Associated Diagnoses: Vascular dementia, with behavioral disturbance; Progressive cognitive dysfunction    Multiple Vitamins-Minerals (MULTIVITAMIN WITH MINERALS) tablet Take 1 tablet by mouth daily.    senna-docusate (SENOKOT-S) 8.6-50 MG tablet Take 2 tablets by mouth 2 (two) times daily. Qty: 30 tablet, Refills: 0    Tamsulosin HCl (FLOMAX) 0.4 MG CAPS Take 0.4 mg by mouth at bedtime.     venlafaxine (EFFEXOR) 75 MG tablet Take 1 tablet (75 mg total) by mouth daily with supper. Qty: 30 tablet, Refills: 0      STOP taking these medications     aspirin 81 MG tablet      insulin lispro (HUMALOG KWIKPEN) 100 UNIT/ML KiwkPen      LORazepam (ATIVAN) 0.5 MG tablet      temazepam (RESTORIL) 15 MG capsule      warfarin (COUMADIN) 7.5 MG tablet      ramelteon (ROZEREM) 8 MG tablet        Allergies  Allergen Reactions  . Doxycycline Swelling    swollen tongue    Follow-up Information    PATERSON,DANIEL G, MD Follow up in 1 week(s).   Specialty:  Internal Medicine Contact information: [address] [place] [address] [phone]            The results of significant diagnostics from this hospitalization (including imaging, microbiology, ancillary and laboratory) are listed below for reference.    Significant  Diagnostic Studies: Dg Chest 2 View  Result Date: 01/12/2016 CLINICAL DATA:  Altered mental status. Cough and right-sided abdominal pain. EXAM: CHEST  2 VIEW COMPARISON:  12/29/2015 chest radiographs and CT FINDINGS: Sequelae of prior CABG in cardiac valve replacement are again identified. Pacemaker remains in place. Cardiac silhouette remains mildly enlarged. Lung volumes are unchanged with similar appearance of reticular opacities at both lung bases. No definite acute consolidation, pleural effusion, or pneumothorax  is identified. No acute osseous abnormality is identified. IMPRESSION: Unchanged appearance of the chest without evidence of acute abnormality. Cardiomegaly and chronic interstitial lung disease. Electronically Signed   By: Sebastian Ache M.D.   On: 01/12/2016 01:48  Ct Head Wo Contrast  Result Date: 02/06/2016 CLINICAL DATA:  Status post fall, hitting head on wall. Concern for cervical spine injury. Patient on Coumadin. Initial encounter. EXAM: CT HEAD WITHOUT CONTRAST CT CERVICAL SPINE WITHOUT CONTRAST TECHNIQUE: Multidetector CT imaging of the head and cervical spine was performed following the standard protocol without intravenous contrast. Multiplanar CT image reconstructions of the cervical spine were also generated. COMPARISON:  CT of the head performed 01/12/2016 FINDINGS: CT HEAD FINDINGS There is a 1.6 cm acute intraparenchymal bleed or contusion at the superior aspect of the right medial temporal lobe. No significant mass effect or midline shift is seen at this time. Prominence of the ventricles and sulci reflects moderate cortical volume loss. Scattered periventricular and subcortical white matter change likely reflects small vessel ischemic microangiopathy. The brainstem and fourth ventricle are within normal limits. The basal ganglia are unremarkable in appearance. The cerebral hemispheres demonstrate grossly normal gray-white differentiation. There is no evidence of fracture;  visualized osseous structures are unremarkable in appearance. The orbits are within normal limits. The paranasal sinuses and mastoid air cells are well-aerated. No significant soft tissue abnormalities are seen. CT CERVICAL SPINE FINDINGS There is no evidence of fracture or subluxation. Vertebral bodies demonstrate normal height and alignment. Mild multilevel disc space narrowing is noted along the cervical spine, with scattered anterior and posterior disc osteophyte complexes, and underlying facet disease. Prevertebral soft tissues are within normal limits. A vague 2.5 cm hypodensity is noted at the left thyroid lobe. Mild emphysematous change and scattered peripheral scarring are seen at the lung apices. Dense calcification is seen at the carotid bifurcations bilaterally, with likely severe bilateral luminal narrowing. The patient is status post median sternotomy. A pacemaker is noted at the left chest wall. IMPRESSION: 1. 1.6 cm acute intraparenchymal bleed or contusion at the superior aspect of the right medial temporal lobe. 2. No evidence of fracture or subluxation along the cervical spine. 3. Moderate cortical volume loss and scattered small vessel ischemic microangiopathy. 4. Mild degenerative change along the cervical spine. 5. Mild emphysematous change and scattered peripheral scarring at the lung apices. 6. Vague 2.5 cm hypodensity at the left thyroid lobe. Consider further evaluation with thyroid ultrasound. If patient is clinically hyperthyroid, consider nuclear medicine thyroid uptake and scan. 7. Dense calcification at the carotid bifurcations bilaterally, with likely severe bilateral luminal narrowing. Carotid ultrasound is recommended for further evaluation, when and as deemed clinically appropriate. Critical Value/emergent results were called by telephone at the time of interpretation on 02/06/2016 at 12:18 am to [facility] PA, who verbally acknowledged these results. Electronically Signed   By:  Roanna Raider M.D.   On: 02/06/2016 00:19   Ct Head Wo Contrast  Result Date: 01/12/2016 CLINICAL DATA:  Altered and agitated. Lethargic earlier today. History of dementia. EXAM: CT HEAD WITHOUT CONTRAST TECHNIQUE: Contiguous axial images were obtained from the base of the skull through the vertex without intravenous contrast. COMPARISON:  Head CT dated 10/31/2015. FINDINGS: Brain: There is generalized age related parenchymal atrophy with commensurate dilatation of the ventricles and sulci. The ventriculomegaly is stable. Chronic small vessel ischemic changes again noted within the bilateral periventricular and subcortical white matter regions. There is no mass, hemorrhage, edema or other evidence of acute parenchymal abnormality. No  extra-axial hemorrhage. Vascular: No hyperdense vessel or unexpected calcification. There are chronic calcified atherosclerotic changes of the large vessels at the skull base. Skull: Negative for fracture or focal lesion. Sinuses/Orbits: No acute findings. Other: None. IMPRESSION: No acute findings. Stable head CT. No intracranial mass, hemorrhage or edema. Electronically Signed   By: Bary Richard M.D.   On: 01/12/2016 08:24  Ct Cervical Spine Wo Contrast  Result Date: 02/06/2016 CLINICAL DATA:  Status post fall, hitting head on wall. Concern for cervical spine injury. Patient on Coumadin. Initial encounter. EXAM: CT HEAD WITHOUT CONTRAST CT CERVICAL SPINE WITHOUT CONTRAST TECHNIQUE: Multidetector CT imaging of the head and cervical spine was performed following the standard protocol without intravenous contrast. Multiplanar CT image reconstructions of the cervical spine were also generated. COMPARISON:  CT of the head performed 01/12/2016 FINDINGS: CT HEAD FINDINGS There is a 1.6 cm acute intraparenchymal bleed or contusion at the superior aspect of the right medial temporal lobe. No significant mass effect or midline shift is seen at this time. Prominence of the ventricles  and sulci reflects moderate cortical volume loss. Scattered periventricular and subcortical white matter change likely reflects small vessel ischemic microangiopathy. The brainstem and fourth ventricle are within normal limits. The basal ganglia are unremarkable in appearance. The cerebral hemispheres demonstrate grossly normal gray-white differentiation. There is no evidence of fracture; visualized osseous structures are unremarkable in appearance. The orbits are within normal limits. The paranasal sinuses and mastoid air cells are well-aerated. No significant soft tissue abnormalities are seen. CT CERVICAL SPINE FINDINGS There is no evidence of fracture or subluxation. Vertebral bodies demonstrate normal height and alignment. Mild multilevel disc space narrowing is noted along the cervical spine, with scattered anterior and posterior disc osteophyte complexes, and underlying facet disease. Prevertebral soft tissues are within normal limits. A vague 2.5 cm hypodensity is noted at the left thyroid lobe. Mild emphysematous change and scattered peripheral scarring are seen at the lung apices. Dense calcification is seen at the carotid bifurcations bilaterally, with likely severe bilateral luminal narrowing. The patient is status post median sternotomy. A pacemaker is noted at the left chest wall. IMPRESSION: 1. 1.6 cm acute intraparenchymal bleed or contusion at the superior aspect of the right medial temporal lobe. 2. No evidence of fracture or subluxation along the cervical spine. 3. Moderate cortical volume loss and scattered small vessel ischemic microangiopathy. 4. Mild degenerative change along the cervical spine. 5. Mild emphysematous change and scattered peripheral scarring at the lung apices. 6. Vague 2.5 cm hypodensity at the left thyroid lobe. Consider further evaluation with thyroid ultrasound. If patient is clinically hyperthyroid, consider nuclear medicine thyroid uptake and scan. 7. Dense calcification  at the carotid bifurcations bilaterally, with likely severe bilateral luminal narrowing. Carotid ultrasound is recommended for further evaluation, when and as deemed clinically appropriate. Critical Value/emergent results were called by telephone at the time of interpretation on 02/06/2016 at 12:18 am to [facility] PA, who verbally acknowledged these results. Electronically Signed   By: Roanna Raider M.D.   On: 02/06/2016 00:19   Ct Abdomen Pelvis W Contrast  Result Date: 01/12/2016 CLINICAL DATA:  Lethargy.  Lower abdominal pain. EXAM: CT ABDOMEN AND PELVIS WITH CONTRAST TECHNIQUE: Multidetector CT imaging of the abdomen and pelvis was performed using the standard protocol following bolus administration of intravenous contrast. CONTRAST:  ISOVUE-300 IOPAMIDOL (ISOVUE-300) INJECTION 61% COMPARISON:  Abdomen CTs dated 12/29/2015 and 02/25/2011. FINDINGS: Lower chest:  Fibrosis and atelectasis at each lung base. Hepatobiliary: Liver and  gallbladder appear normal. Pancreas: Pancreas appears normal. Spleen: Within normal limits in size and appearance. Adrenals/Urinary Tract: Adrenal glands appear normal. Stable left renal cyst. Probable tiny right renal cyst. Calcifications within each renal hilum are favored to be vascular in nature. No obstructing renal or ureteral calculi. Prostate gland is heterogeneously enlarged causing mass effect on the bladder base. Bladder is otherwise unremarkable. Stomach/Bowel: Bowel is normal in caliber. Scattered mild diverticulosis within the sigmoid colon without evidence of acute diverticulitis. Appendix is normal. Stomach appears normal. Vascular/Lymphatic: Heavy atherosclerotic changes of the normal caliber abdominal aorta, aortic branch vessels and pelvic vasculature. No enlarged lymph nodes seen within the abdomen or pelvis. Reproductive: Heterogeneous/nodular enlargement of the prostate gland, as above. Other: No free fluid or abscess collections seen. No free  intraperitoneal air. Musculoskeletal: Degenerative changes of the lumbar spine, moderate in degree. Minimally displaced fracture of the left lateral eighth rib, of uncertain age. Superficial soft tissues are unremarkable. IMPRESSION: 1. No acute findings within the abdomen or pelvis. 2. Heterogeneous/nodular enlargement of the prostate gland, causing mass effect on the bladder base. No associated bladder distension. 3. Colonic diverticulosis without evidence of acute diverticulitis. 4. Aortic atherosclerosis. 5. Additional chronic/incidental findings detailed above. Electronically Signed   By: Bary RichardStan  Maynard M.D.   On: 01/12/2016 07:54  Dg Abd Portable 1v  Result Date: 02/08/2016 CLINICAL DATA:  Two day history of abdominal pain EXAM: PORTABLE ABDOMEN - 1 VIEW COMPARISON:  CT abdomen and pelvis January 12, 2016 FINDINGS: There is mild stool in the colon. The bowel gas pattern is unremarkable. No bowel dilatation or air-fluid level to suggest obstruction. No free air. There are phleboliths in the pelvis. IMPRESSION: Bowel gas pattern unremarkable.  No obstruction or free air evident. Electronically Signed   By: Bretta BangWilliam  Woodruff III M.D.   On: 02/08/2016 11:47   Dg Hip Unilat With Pelvis 2-3 Views Right  Result Date: 02/05/2016 CLINICAL DATA:  Fall.  Right hip pain. EXAM: DG HIP (WITH OR WITHOUT PELVIS) 2-3V RIGHT COMPARISON:  Right hip radiographs 08/05/2015 FINDINGS: The right hip is located. No acute bone or soft tissue abnormality is present. Pelvis is intact. Vascular calcifications are noted. IMPRESSION: 1. No acute abnormality. 2. Atherosclerosis. Electronically Signed   By: Marin Robertshristopher  Mattern M.D.   On: 02/05/2016 23:48    Microbiology: Recent Results (from the past 240 hour(s))  MRSA PCR Screening     Status: None   Collection Time: 02/06/16  6:47 AM  Result Value Ref Range Status   MRSA by PCR NEGATIVE NEGATIVE Final    Comment:        The GeneXpert MRSA Assay (FDA approved for NASAL  specimens only), is one component of a comprehensive MRSA colonization surveillance program. It is not intended to diagnose MRSA infection nor to guide or monitor treatment for MRSA infections.   Urine culture     Status: None   Collection Time: 02/06/16  9:44 AM  Result Value Ref Range Status   Specimen Description URINE, CATHETERIZED  Final   Special Requests NONE  Final   Culture NO GROWTH  Final   Report Status 02/07/2016 FINAL  Final     Labs: Basic Metabolic Panel:  Recent Labs Lab 02/05/16 2332 02/06/16 1047 02/07/16 0545 02/08/16 0550  NA 139 141 143 143  K 2.6* 2.9* 3.8 3.5  CL 103 105 106 107  CO2 31 24 28 29   GLUCOSE 148* 159* 115* 109*  BUN 10 6 11 14   CREATININE 0.73 0.67  0.65 0.71  CALCIUM 8.7* 8.8* 8.7* 8.5*  MG 1.8  --   --   --    Liver Function Tests:  Recent Labs Lab 02/05/16 2332  AST 19  ALT 14*  ALKPHOS 68  BILITOT 0.6  PROT 6.5  ALBUMIN 3.3*   No results for input(s): LIPASE, AMYLASE in the last 168 hours. No results for input(s): AMMONIA in the last 168 hours. CBC:  Recent Labs Lab 02/05/16 2332 02/07/16 0545 02/08/16 0550  WBC 6.5 8.6 9.3  NEUTROABS 4.1  --   --   HGB 13.3 12.7* 12.8*  HCT 41.0 40.4 40.5  MCV 87.4 88.4 90.2  PLT 336 338 305   Cardiac Enzymes: No results for input(s): CKTOTAL, CKMB, CKMBINDEX, TROPONINI in the last 168 hours. BNP: BNP (last 3 results) No results for input(s): BNP in the last 8760 hours.  ProBNP (last 3 results) No results for input(s): PROBNP in the last 8760 hours.  CBG:  Recent Labs Lab 02/08/16 2119 02/09/16 0759 02/09/16 1148 02/09/16 2123 02/10/16 0814  GLUCAP 108* 108* 97 202* 128*       Signed:  Jeralyn Bennett MD.  [facility] 02/10/2016, 12:07 PM

## 2016-02-10 NOTE — Progress Notes (Signed)
NURSING PROGRESS NOTE  Retia PasseCharles D Eyster, [name] [id number] Discharge Data: 02/10/2016 1:26 PM Attending Provider: No att. providers found FAO:[id number]:PATERSON,DANIEL Reece AgarG, MD     Retia Passeharles D Figley, [name] to be D/C'd [facility], per MD order.  Discussed with the patient the After Visit Summary and all questions fully answered. All IV's discontinued with no bleeding noted. All belongings returned to patient for patient to take home.   Last Vital Signs:  Blood pressure (!) 159/60, pulse 90, temperature 98.5 F (36.9 C), resp. rate 18, height 5\' 6"  (1.676 m), weight 69.7 kg (153 lb 10.6 oz), SpO2 98 %.  Discharge Medication List   Medication List    STOP taking these medications   aspirin 81 MG tablet   HUMALOG KWIKPEN 100 UNIT/ML KiwkPen Generic drug:  insulin lispro   LORazepam 0.5 MG tablet Commonly known as:  ATIVAN   ramelteon 8 MG tablet Commonly known as:  ROZEREM   temazepam 15 MG capsule Commonly known as:  RESTORIL   warfarin 7.5 MG tablet Commonly known as:  COUMADIN     TAKE these medications   acetaminophen 500 MG tablet Commonly known as:  TYLENOL Take 2 tablets (1,000 mg total) by mouth 3 (three) times daily.   albuterol 108 (90 Base) MCG/ACT inhaler Commonly known as:  PROVENTIL HFA;VENTOLIN HFA Inhale 2 puffs into the lungs every 6 (six) hours as needed for wheezing.   budesonide-formoterol 160-4.5 MCG/ACT inhaler Commonly known as:  SYMBICORT Inhale 2 puffs into the lungs 2 (two) times daily.   cilostazol 50 MG tablet Commonly known as:  PLETAL Take 50 mg by mouth 2 (two) times daily.   dicyclomine 20 MG tablet Commonly known as:  BENTYL Take 20 mg by mouth every 6 (six) hours.   finasteride 5 MG tablet Commonly known as:  PROSCAR Take 1 tablet (5 mg total) by mouth daily.   insulin glargine 100 unit/mL Sopn Commonly known as:  LANTUS Inject 20 Units into the skin at bedtime.   losartan 100 MG tablet Commonly known as:   COZAAR Take 1 tablet (100 mg total) by mouth daily.   memantine 10 MG tablet Commonly known as:  NAMENDA Take 1 tablet (10 mg total) by mouth 2 (two) times daily.   methimazole 10 MG tablet Commonly known as:  TAPAZOLE Take 1 tablet (10 mg total) by mouth daily.   metoprolol 50 MG tablet Commonly known as:  LOPRESSOR Take 1 tablet (50 mg total) by mouth 2 (two) times daily. What changed:  medication strength  how much to take   multivitamin with minerals tablet Take 1 tablet by mouth daily.   oxyCODONE 5 MG immediate release tablet Commonly known as:  ROXICODONE Take 1 tablet (5 mg total) by mouth every 6 (six) hours as needed for severe pain.   QUEtiapine 25 MG tablet Commonly known as:  SEROQUEL Take 1 tablet (25 mg total) by mouth 2 (two) times daily.   senna-docusate 8.6-50 MG tablet Commonly known as:  Senokot-S Take 2 tablets by mouth 2 (two) times daily.   tamsulosin 0.4 MG Caps capsule Commonly known as:  FLOMAX Take 0.4 mg by mouth at bedtime.   venlafaxine 75 MG tablet Commonly known as:  EFFEXOR Take 1 tablet (75 mg total) by mouth daily with supper.      Report called and given to nurse, Mordecai Maesherese, receiving patient.

## 2016-02-12 ENCOUNTER — Ambulatory Visit: Payer: Medicare Other | Admitting: Nurse Practitioner

## 2016-02-19 ENCOUNTER — Other Ambulatory Visit (HOSPITAL_COMMUNITY): Payer: Self-pay | Admitting: Internal Medicine

## 2016-02-20 ENCOUNTER — Other Ambulatory Visit (HOSPITAL_COMMUNITY): Payer: Self-pay | Admitting: Internal Medicine

## 2016-02-20 DIAGNOSIS — I629 Nontraumatic intracranial hemorrhage, unspecified: Secondary | ICD-10-CM

## 2016-02-28 ENCOUNTER — Ambulatory Visit (HOSPITAL_COMMUNITY)
Admission: RE | Admit: 2016-02-28 | Discharge: 2016-02-28 | Disposition: A | Discharge status: Home / Self Care | Payer: Medicare Other | Source: Ambulatory Visit | Attending: Internal Medicine | Admitting: Internal Medicine

## 2016-02-28 DIAGNOSIS — G9389 Other specified disorders of brain: Secondary | ICD-10-CM | POA: Diagnosis not present

## 2016-02-28 DIAGNOSIS — I629 Nontraumatic intracranial hemorrhage, unspecified: Secondary | ICD-10-CM | POA: Diagnosis present

## 2016-03-04 ENCOUNTER — Encounter: Payer: Self-pay | Admitting: Internal Medicine

## 2016-03-04 ENCOUNTER — Ambulatory Visit (INDEPENDENT_AMBULATORY_CARE_PROVIDER_SITE_OTHER): Payer: Medicare Other | Admitting: Internal Medicine

## 2016-03-04 VITALS — BP 162/60 | HR 86 | Ht 66.0 in

## 2016-03-04 DIAGNOSIS — I442 Atrioventricular block, complete: Secondary | ICD-10-CM | POA: Diagnosis not present

## 2016-03-04 DIAGNOSIS — I25810 Atherosclerosis of coronary artery bypass graft(s) without angina pectoris: Secondary | ICD-10-CM

## 2016-03-04 DIAGNOSIS — I441 Atrioventricular block, second degree: Secondary | ICD-10-CM | POA: Diagnosis not present

## 2016-03-04 DIAGNOSIS — R55 Syncope and collapse: Secondary | ICD-10-CM | POA: Diagnosis not present

## 2016-03-04 MED ORDER — METOPROLOL TARTRATE 100 MG PO TABS: 100.0000 mg | ORAL_TABLET | Freq: Two times a day (BID) | ORAL | Status: AC

## 2016-03-04 NOTE — Patient Instructions (Addendum)
Medication Instructions:  Your physician has recommended you make the following change in your medication:   1) Stay off Coumadin 2) Increase Metoprolol 100 mg twice a day  Labwork: None Ordered   Testing/Procedures: None ordered   Follow-Up:  Your physician recommends that you schedule a follow-up appointment in: as needed with Dr. Johney FrameAllred.    Remote monitoring is used to monitor your Pacemaker from home. This monitoring reduces the number of office visits required to check your device to one time per year. It allows us to keep an eye on the functioning of your device to ensure it is working properly. You are scheduled for a device check from home on 06/03/16. You may send your transmission at any time that day. If you have a wireless device, the transmission will be sent automatically. After your physician reviews your transmission, you will receive a postcard with your next transmission date.    Any Other Special Instructions Will Be Listed Below (If Applicable).     If you need a refill on your cardiac medications before your next appointment, please call your pharmacy.

## 2016-03-04 NOTE — Progress Notes (Signed)
PCP:  PIAZZA, NICHOLAS A, MD  The patient presents today for follow-up after his recent hospitalization for mechanical fall with ICH.  He has had dramatic decline over the past year.  He has advanced dementia with cognitive impairment.  His wife states that this has worsened since he left the hospital.  He is acting out in the nursing home and is combative at times.  He does not remember his wife.  He is frequently agitated.    Past Medical History:  Diagnosis Date  . Anxiety   . Aortic stenosis, moderate    moderate to severe per echo 03/2012 with AVR in March of 2014  . Atrial flutter [facility](HCC)    s/p CTI ablation by Dr Ladona Ridgelaylor 1/12  . Atrial tachycardia (HCC)   . Bifascicular block   . CAD (coronary artery disease)    s/p CABG x 5 2003 by Dr Laneta SimmersBartle  . Complete heart block (HCC) 08/21/15   STJ PPM, Dr. Johney FrameAllred  . DDD (degenerative disc disease)   . 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 Depression   . Diverticulosis   . DJD (degenerative joint disease)   . Hemorrhoids   . Hyperlipidemia   . Hypertension   . Nephrolithiasis   . OSA (obstructive sleep apnea)   . S/P AVR (aortic valve replacement)   . Spinal stenosis of lumbar region   . Type II or unspecified type diabetes mellitus without mention of complication, not stated as uncontrolled    Past Surgical History:  Procedure Laterality Date  . ANAL FISSURE REPAIR    . AORTIC VALVE REPLACEMENT N/A 09/01/2012   Procedure: AORTIC VALVE REPLACEMENT (AVR);  Surgeon: Alleen BorneBryan K Bartle, MD;  [facility];  Service: Open Heart Surgery;  Laterality: N/A;  . ATRIAL ABLATION SURGERY  1/12   atrial flutter ablation by Dr Ladona Ridgelaylor  . BACK SURGERY  2010  . CABG  2003   by Dr Laneta SimmersBartle  . CARDIOVERSION  04/07/2012   Procedure: CARDIOVERSION;  Surgeon: Wendall StadePeter C Nishan, MD;  [facility];  Service: Cardiovascular;  Laterality: N/A;  . CORONARY ARTERY BYPASS GRAFT     10 yrs ago   03  . EP IMPLANTABLE DEVICE N/A  08/21/2015   STJ PPM, Dr. Johney FrameAllred  . INTRAOPERATIVE TRANSESOPHAGEAL ECHOCARDIOGRAM N/A 09/01/2012   Procedure: INTRAOPERATIVE TRANSESOPHAGEAL ECHOCARDIOGRAM;  Surgeon: Alleen BorneBryan K Bartle, MD;  [facility];  Service: Open Heart Surgery;  Laterality: N/A;  . LEG SURGERY     right  . TONSILLECTOMY    . TRIGGER FINGER RELEASE     bilat.    Current Outpatient Prescriptions  Medication Sig Dispense Refill  . acetaminophen (TYLENOL) 500 MG tablet Take 2 tablets (1,000 mg total) by mouth 3 (three) times daily. 30 tablet 1  . albuterol (PROVENTIL HFA;VENTOLIN HFA) 108 (90 BASE) MCG/ACT inhaler Inhale 2 puffs into the lungs every 6 (six) hours as needed for wheezing. 1 Inhaler 6  . budesonide-formoterol (SYMBICORT) 160-4.5 MCG/ACT inhaler Inhale 2 puffs into the lungs 2 (two) times daily. 1 Inhaler 5  . cilostazol (PLETAL) 50 MG tablet Take 50 mg by mouth 2 (two) times daily.    [name]. dicyclomine (BENTYL) 20 MG tablet Take 20 mg by mouth every 6 (six) hours.  10  . finasteride (PROSCAR) 5 MG tablet Take 1 tablet (5 mg total) by mouth daily. 30 tablet 1  . insulin aspart (NOVOLOG) 100 UNIT/ML injection Inject into the skin as directed.    . Insulin  Detemir (LEVEMIR FLEXPEN [place]) Inject 20 Units into the skin at bedtime.    [name] losartan (COZAAR) 100 MG tablet Take 1 tablet (100 mg total) by mouth daily. 90 tablet 3  . MELATONIN PO Take 6 mg by mouth at bedtime.    . memantine (NAMENDA) 10 MG tablet Take 1 tablet (10 mg total) by mouth 2 (two) times daily. 180 tablet 1  . methimazole (TAPAZOLE) 10 MG tablet Take 1 tablet (10 mg total) by mouth daily. 30 tablet 0  . methocarbamol (ROBAXIN) 500 MG tablet Take 500 mg by mouth every 4 (four) hours as needed for muscle spasms.    . metoprolol (LOPRESSOR) 100 MG tablet Take 1 tablet (100 mg total) by mouth 2 (two) times daily.    . Multiple Vitamins-Minerals (MULTIVITAMIN WITH MINERALS) tablet Take 1 tablet by mouth daily.    [name] oxyCODONE (ROXICODONE) 5 MG immediate  release tablet Take 1 tablet (5 mg total) by mouth every 6 (six) hours as needed for severe pain. 20 tablet 0  . QUEtiapine (SEROQUEL) 25 MG tablet Take 1 tablet (25 mg total) by mouth 2 (two) times daily. 60 tablet 0  . senna-docusate (SENOKOT-S) 8.6-50 MG tablet Take 2 tablets by mouth 2 (two) times daily. 30 tablet 0  . Tamsulosin HCl (FLOMAX) 0.4 MG CAPS Take 0.4 mg by mouth at bedtime.     [name] venlafaxine (EFFEXOR) 75 MG tablet Take 1 tablet (75 mg total) by mouth daily with supper. 30 tablet 0   No current facility-administered medications for this visit.     Allergies  Allergen Reactions  . Doxycycline Swelling    swollen tongue    ROS- unable to provide  Social History   Social History  . Marital status: Married    Spouse name: Lynnette  . Number of children: 5  . Years of education: college   Occupational History  . pharmacist Unemployed    retired   Social History Main Topics  . Smoking status: Former Smoker    Packs/day: 1.00    Years: 49.00    Quit date: 08/21/2001  . Smokeless tobacco: Never Used     Comment: started at age 89.   [name] Alcohol use No     Comment: quit 15 yrs ago  . Drug use: No  . Sexual activity: No   Other Topics Concern  . 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   Patient has two children and his wife has three children.   Patient has a college education.   Patient is right- handed.   Patient does not drink any caffeine.    He is a retired Teacher, early years/pre.    Family History  Problem Relation Age of Onset  . Heart disease Father   . Stroke Mother   . Rheum arthritis Mother   . Heart disease Paternal Grandfather   . Colon cancer Neg Hx   . Stomach cancer Neg Hx     Physical Exam: Vitals:   03/04/16 1110  BP: (!) 162/60  Pulse: 86  Height: 5\' 6"  (1.676 m)    GEN- The patient is alert but very confused.  He yells out frequently in the office today Head-  normocephalic, atraumatic Eyes-  Sclera clear, conjunctiva pink Ears- hearing intact Oropharynx- clear Neck- supple  Lungs- Clear to ausculation bilaterally, normal work of breathing Heart- Regular rate and rhythm, crisp A2 GI- soft, NT, ND, +  BS Extremities- no clubbing, cyanosis, or edema Pacemaker pocket is well healed  Pacemaker interrogation is reviewed and normal  Assessment and Plan:  1. Mobitz II second degree AV block Doing well s/p PPM Normal pacemaker function See Pace Art report No changes today Followed with Merlin  2. Atrial tachycardia Asymptomatic Increase metoprolol to 100mg  bid today  3. S/p AVR Stable No change required today  4. Atria fibrillation Not a coumadin candidate due to agitation/ combativeness, and falls  5. CAD Stable No change required today  DNI/DNR status confirmed today with spouse She wishes that he be followed remotely and return to see me as needed  [name] MD, [facility] 03/04/2016

## 2016-03-12 ENCOUNTER — Ambulatory Visit (INDEPENDENT_AMBULATORY_CARE_PROVIDER_SITE_OTHER): Payer: Medicare Other | Admitting: Endocrinology

## 2016-03-12 ENCOUNTER — Encounter: Payer: Self-pay | Admitting: Endocrinology

## 2016-03-12 VITALS — BP 112/62 | HR 73 | Wt 139.0 lb

## 2016-03-12 DIAGNOSIS — F0280 Dementia in other diseases classified elsewhere without behavioral disturbance: Secondary | ICD-10-CM | POA: Diagnosis not present

## 2016-03-12 DIAGNOSIS — E0590 Thyrotoxicosis, unspecified without thyrotoxic crisis or storm: Secondary | ICD-10-CM

## 2016-03-12 DIAGNOSIS — G931 Anoxic brain damage, not elsewhere classified: Secondary | ICD-10-CM | POA: Diagnosis not present

## 2016-03-12 DIAGNOSIS — R410 Disorientation, unspecified: Secondary | ICD-10-CM

## 2016-03-12 DIAGNOSIS — I25810 Atherosclerosis of coronary artery bypass graft(s) without angina pectoris: Secondary | ICD-10-CM | POA: Diagnosis not present

## 2016-03-12 NOTE — Patient Instructions (Addendum)
blood tests are requested for you today.  We'll let you know about the results.   Based on the results, we probably need to increase the methimazole. If resident ever has fever while taking methimazole, stop it and call us, even if the reason is obvious, because of the risk of a rare side-effect.   Please come back for a follow-up appointment in 1 month.

## 2016-03-12 NOTE — Progress Notes (Signed)
Subjective:    Patient ID: Adam Passe, [name], male    DOB: May 25, 1935, 81 y.o.   MRN: [id number]  HPI Pt reports he was dx'ed with hyperthyroidism in mid-2017, in eval for AF.  He was rx'ed tapazole 10/d.  He has never had XRT to the anterior neck, or thyroid surgery.  He has never had thyroid imaging.  He does not consume kelp or any other prescribed or non-prescribed thyroid medication.  He has never been on amiodarone.  Hx is from wife.  She says he has slight muscle weakness throughout the body, and assoc weight loss (40 lbs x a few months).   Past Medical History:  Diagnosis Date  . Anxiety   . Aortic stenosis, moderate    moderate to severe per echo 03/2012 with AVR in March of 2014  . Atrial flutter [facility])    s/p CTI ablation by Dr Ladona Ridgel 1/12  . Atrial tachycardia (HCC)   . Bifascicular block   . CAD (coronary artery disease)    s/p CABG x 5 2003 by Dr Laneta Simmers  . Complete heart block (HCC) 08/21/15   STJ PPM, Dr. Johney Frame  . DDD (degenerative disc disease)   . 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 Depression   . Diverticulosis   . DJD (degenerative joint disease)   . Hemorrhoids   . Hyperlipidemia   . Hypertension   . Nephrolithiasis   . OSA (obstructive sleep apnea)   . S/P AVR (aortic valve replacement)   . Spinal stenosis of lumbar region   . Type II or unspecified type diabetes mellitus without mention of complication, not stated as uncontrolled     Past Surgical History:  Procedure Laterality Date  . ANAL FISSURE REPAIR    . AORTIC VALVE REPLACEMENT N/A 09/01/2012   Procedure: AORTIC VALVE REPLACEMENT (AVR);  Surgeon: Alleen Borne, MD;  Location: [facility] OR;  Service: Open Heart Surgery;  Laterality: N/A;  . ATRIAL ABLATION SURGERY  1/12   atrial flutter ablation by Dr Ladona Ridgel  . BACK SURGERY  2010  . CABG  2003   by Dr Laneta Simmers  . CARDIOVERSION  04/07/2012   Procedure: CARDIOVERSION;  Surgeon: Wendall Stade, MD;  Location: [facility] ENDOSCOPY;   Service: Cardiovascular;  Laterality: N/A;  . CORONARY ARTERY BYPASS GRAFT     10 yrs ago   03  . EP IMPLANTABLE DEVICE N/A 08/21/2015   STJ PPM, Dr. Johney Frame  . INTRAOPERATIVE TRANSESOPHAGEAL ECHOCARDIOGRAM N/A 09/01/2012   Procedure: INTRAOPERATIVE TRANSESOPHAGEAL ECHOCARDIOGRAM;  Surgeon: Alleen Borne, MD;  [facility];  Service: Open Heart Surgery;  Laterality: N/A;  . LEG SURGERY     right  . TONSILLECTOMY    . TRIGGER FINGER RELEASE     bilat.    Social History   Social History  . Marital status: Married    Spouse name: Lynnette  . Number of children: 5  . Years of education: college   Occupational History  . pharmacist Unemployed    retired   Social History Main Topics  . Smoking status: Former Smoker    Packs/day: 1.00    Years: 49.00    Quit date: 08/21/2001  . Smokeless tobacco: Never Used     Comment: started at age 65.   [name] Alcohol use No     Comment: quit 15 yrs ago  . Drug use: No  . Sexual activity: No   Other Topics Concern  . Not on file  Social History Narrative   Patient is married Market researcher(Lynnette) and lives at home with his wife.   Patient has two children and his wife has three children.   Patient has a college education.   Patient is right- handed.   Patient does not drink any caffeine.    He is a retired Teacher, early years/prepharmacist.    Current Outpatient Prescriptions on File Prior to Visit  Medication Sig Dispense Refill  . acetaminophen (TYLENOL) 500 MG tablet Take 2 tablets (1,000 mg total) by mouth 3 (three) times daily. 30 tablet 1  . albuterol (PROVENTIL HFA;VENTOLIN HFA) 108 (90 BASE) MCG/ACT inhaler Inhale 2 puffs into the lungs every 6 (six) hours as needed for wheezing. 1 Inhaler 6  . budesonide-formoterol (SYMBICORT) 160-4.5 MCG/ACT inhaler Inhale 2 puffs into the lungs 2 (two) times daily. 1 Inhaler 5  . cilostazol (PLETAL) 50 MG tablet Take 50 mg by mouth 2 (two) times daily.    [name]. dicyclomine (BENTYL) 20 MG tablet Take 20 mg by mouth every 6 (six)  hours.  10  . finasteride (PROSCAR) 5 MG tablet Take 1 tablet (5 mg total) by mouth daily. 30 tablet 1  . insulin aspart (NOVOLOG) 100 UNIT/ML injection Inject into the skin as directed.    . Insulin Detemir (LEVEMIR FLEXPEN [place]) Inject 20 Units into the skin at bedtime.    [name]. losartan (COZAAR) 100 MG tablet Take 1 tablet (100 mg total) by mouth daily. 90 tablet 3  . MELATONIN PO Take 6 mg by mouth at bedtime.    . memantine (NAMENDA) 10 MG tablet Take 1 tablet (10 mg total) by mouth 2 (two) times daily. 180 tablet 1  . methocarbamol (ROBAXIN) 500 MG tablet Take 500 mg by mouth every 4 (four) hours as needed for muscle spasms.    . metoprolol (LOPRESSOR) 100 MG tablet Take 1 tablet (100 mg total) by mouth 2 (two) times daily.    . Multiple Vitamins-Minerals (MULTIVITAMIN WITH MINERALS) tablet Take 1 tablet by mouth daily.    [name]. oxyCODONE (ROXICODONE) 5 MG immediate release tablet Take 1 tablet (5 mg total) by mouth every 6 (six) hours as needed for severe pain. 20 tablet 0  . QUEtiapine (SEROQUEL) 25 MG tablet Take 1 tablet (25 mg total) by mouth 2 (two) times daily. 60 tablet 0  . senna-docusate (SENOKOT-S) 8.6-50 MG tablet Take 2 tablets by mouth 2 (two) times daily. 30 tablet 0  . Tamsulosin HCl (FLOMAX) 0.4 MG CAPS Take 0.4 mg by mouth at bedtime.     [name]. venlafaxine (EFFEXOR) 75 MG tablet Take 1 tablet (75 mg total) by mouth daily with supper. 30 tablet 0   No current facility-administered medications on file prior to visit.     Allergies  Allergen Reactions  . Doxycycline Swelling    swollen tongue     Family History  Problem Relation Age of Onset  . Heart disease Father   . Stroke Mother   . Rheum arthritis Mother   . Heart disease Paternal Grandfather   . Colon cancer Neg Hx   . Stomach cancer Neg Hx     BP 112/62   Pulse 73   Wt 139 lb (63 kg)   SpO2 96%   BMI 22.44 kg/m    Review of Systems denies fever headache, hoarseness, visual loss, palpitations, sob, diarrhea,  polyuria, arthralgias, excessive diaphoresis, tremor, heat intolerance, and rhinorrhea.  He has anxiety and easy bruising.    Objective:   Physical Exam VS: see  vs page GEN: no distress.  In wheelchair. HEAD: head: no deformity eyes: no periorbital swelling; slight bilat proptosis external nose and ears are normal mouth: no lesion seen NECK: supple, thyroid is not enlarged.   CHEST WALL: no deformity.  Old healed surgical scars are noted.  LUNGS: clear to auscultation CV: reg rate and rhythm, no murmur. ABD: abdomen is soft, nontender.  no hepatosplenomegaly.  not distended.  no hernia MUSCULOSKELETAL: muscle bulk and strength are grossly normal.  no obvious joint swelling.  gait is normal and steady EXTEMITIES: no deformity.  no ulcer on the feet.  feet are of normal color and temp.  no edema PULSES: dorsalis pedis intact bilat.  no carotid bruit NEURO:  cn 2-12 grossly intact.   readily moves all 4's.  sensation is intact to touch on the feet.  No tremor.  SKIN:  Normal texture and temperature.  No rash or suspicious lesion is visible.  Few ecchymoses of the forearms.   NODES:  None palpable at the neck.  PSYCH: alert, oriented to self only.  anxious.   Lab Results  Component Value Date   TSH 0.08 (L) 03/12/2016   I have reviewed outside records, and summarized: Pt was seen at [facility], for AF.  He is not a coumadin candidate, due to high fall risk.      Assessment & Plan:  Hyperthyroidism: persistent on rx. I have sent a prescription to your pharmacy, to increase tapazole. AF: in this context, he needs prompt improvement of hyperthyroidism.  Dementia: this limits pt's ability to provide history.

## 2016-03-13 LAB — TSH: TSH: 0.08 u[IU]/mL — ABNORMAL LOW (ref 0.35–4.50)

## 2016-03-13 LAB — T4, FREE: FREE T4: 1.63 ng/dL — AB (ref 0.60–1.60)

## 2016-03-13 MED ORDER — METHIMAZOLE 10 MG PO TABS: 10.0000 mg | ORAL_TABLET | Freq: Two times a day (BID) | ORAL | 3 refills | Status: DC

## 2016-03-16 ENCOUNTER — Telehealth: Payer: Self-pay | Admitting: Endocrinology

## 2016-03-16 MED ORDER — METHIMAZOLE 10 MG PO TABS: 10.0000 mg | ORAL_TABLET | Freq: Two times a day (BID) | ORAL | 3 refills | Status: DC

## 2016-03-16 NOTE — Telephone Encounter (Signed)
Please res[facility] the [facility]

## 2016-03-16 NOTE — Telephone Encounter (Signed)
[facility] [phone]  Please remove the [facility] pharmacy from his chart

## 2016-03-16 NOTE — Telephone Encounter (Signed)
[facility]ethim[facility] per [facility]'s wife, [facility] removed per patient's request.

## 2016-03-16 NOTE — Telephone Encounter (Signed)
I contacted the patient's wife and left a voicemail requesting a call back to verify what the fax number is so we can send the methimazole prescription.

## 2016-04-09 ENCOUNTER — Encounter: Payer: Self-pay | Admitting: Endocrinology

## 2016-04-09 ENCOUNTER — Ambulatory Visit (INDEPENDENT_AMBULATORY_CARE_PROVIDER_SITE_OTHER): Payer: Medicare Other | Admitting: Endocrinology

## 2016-04-09 VITALS — BP 112/70 | HR 66

## 2016-04-09 DIAGNOSIS — I25810 Atherosclerosis of coronary artery bypass graft(s) without angina pectoris: Secondary | ICD-10-CM

## 2016-04-09 DIAGNOSIS — E0590 Thyrotoxicosis, unspecified without thyrotoxic crisis or storm: Secondary | ICD-10-CM

## 2016-04-09 LAB — T4, FREE: FREE T4: 1.16 ng/dL (ref 0.60–1.60)

## 2016-04-09 LAB — TSH: TSH: 0.43 u[IU]/mL (ref 0.35–4.50)

## 2016-04-09 MED ORDER — METHIMAZOLE 10 MG PO TABS: 10.0000 mg | ORAL_TABLET | Freq: Every day | ORAL | 3 refills | Status: AC

## 2016-04-09 NOTE — Patient Instructions (Addendum)
blood tests are requested for you today.  We'll let you know about the results.   If resident ever has fever while taking methimazole, stop it and call us, even if the reason is obvious, because of the risk of a rare side-effect.   Please come back for a follow-up appointment in 3 months.

## 2016-04-09 NOTE — Progress Notes (Signed)
Subjective:    Patient ID: Adam Passe, [name], male    DOB: 1935/05/28, 81 y.o.   MRN: [id number]  HPI Pt returns for f/u of hyperthyroidism (dx'ed mid-2017, in eval for AF; he has never had thyroid imaging; dementia limits hx from patient; tapazole is chosen as rx, due to AF, and inability to be isolated for RAI).  Tapazole is still qd only.   Past Medical History:  Diagnosis Date  . Anxiety   . Aortic stenosis, moderate    moderate to severe per echo 03/2012 with AVR in March of 2014  . Atrial flutter [facility])    s/p CTI ablation by Dr Ladona Ridgel 1/12  . Atrial tachycardia (HCC)   . Bifascicular block   . CAD (coronary artery disease)    s/p CABG x 5 2003 by Dr Laneta Simmers  . Complete heart block (HCC) 08/21/15   [facility] PPM, Dr. Johney Frame  . DDD (degenerative disc disease)   . 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 Depression   . Diverticulosis   . DJD (degenerative joint disease)   . Hemorrhoids   . Hyperlipidemia   . Hypertension   . Nephrolithiasis   . OSA (obstructive sleep apnea)   . S/P AVR (aortic valve replacement)   . Spinal stenosis of lumbar region   . Type II or unspecified type diabetes mellitus without mention of complication, not stated as uncontrolled     Past Surgical History:  Procedure Laterality Date  . ANAL FISSURE REPAIR    . AORTIC VALVE REPLACEMENT N/A 09/01/2012   Procedure: AORTIC VALVE REPLACEMENT (AVR);  Surgeon: Alleen Borne, MD;  Location: [facility] OR;  Service: Open Heart Surgery;  Laterality: N/A;  . ATRIAL ABLATION SURGERY  1/12   atrial flutter ablation by Dr Ladona Ridgel  . BACK SURGERY  2010  . CABG  2003   by Dr Laneta Simmers  . CARDIOVERSION  04/07/2012   Procedure: CARDIOVERSION;  Surgeon: Wendall Stade, MD;  Location: [facility] ENDOSCOPY;  Service: Cardiovascular;  Laterality: N/A;  . CORONARY ARTERY BYPASS GRAFT     10 yrs ago   03  . EP IMPLANTABLE DEVICE N/A 08/21/2015   [facility] PPM, Dr. Johney Frame  . INTRAOPERATIVE TRANSESOPHAGEAL ECHOCARDIOGRAM  N/A 09/01/2012   Procedure: INTRAOPERATIVE TRANSESOPHAGEAL ECHOCARDIOGRAM;  Surgeon: Alleen Borne, MD;  Location: [facility] OR;  Service: Open Heart Surgery;  Laterality: N/A;  . LEG SURGERY     right  . TONSILLECTOMY    . TRIGGER FINGER RELEASE     bilat.    Social History   Social History  . Marital status: Married    Spouse name: Lynnette  . Number of children: 5  . Years of education: college   Occupational History  . pharmacist Unemployed    retired   Social History Main Topics  . Smoking status: Former Smoker    Packs/day: 1.00    Years: 49.00    Quit date: 08/21/2001  . Smokeless tobacco: Never Used     Comment: started at age 64.   [name] Alcohol use No     Comment: quit 15 yrs ago  . Drug use: No  . Sexual activity: No   Other Topics Concern  . 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   Patient has two children and his wife has three children.   Patient has a college education.   Patient is right- handed.  Patient does not drink any caffeine.    He is a retired Teacher, early years/prepharmacist.    Current Outpatient Prescriptions on File Prior to Visit  Medication Sig Dispense Refill  . acetaminophen (TYLENOL) 500 MG tablet Take 2 tablets (1,000 mg total) by mouth 3 (three) times daily. 30 tablet 1  . albuterol (PROVENTIL HFA;VENTOLIN HFA) 108 (90 BASE) MCG/ACT inhaler Inhale 2 puffs into the lungs every 6 (six) hours as needed for wheezing. 1 Inhaler 6  . budesonide-formoterol (SYMBICORT) 160-4.5 MCG/ACT inhaler Inhale 2 puffs into the lungs 2 (two) times daily. 1 Inhaler 5  . cilostazol (PLETAL) 50 MG tablet Take 50 mg by mouth 2 (two) times daily.    . finasteride (PROSCAR) 5 MG tablet Take 1 tablet (5 mg total) by mouth daily. 30 tablet 1  . insulin aspart (NOVOLOG) 100 UNIT/ML injection Inject into the skin as directed.    . Insulin Detemir (LEVEMIR FLEXPEN [place]) Inject 20 Units into the skin at bedtime.    [name]. losartan  (COZAAR) 100 MG tablet Take 1 tablet (100 mg total) by mouth daily. 90 tablet 3  . MELATONIN PO Take 6 mg by mouth at bedtime.    . memantine (NAMENDA) 10 MG tablet Take 1 tablet (10 mg total) by mouth 2 (two) times daily. 180 tablet 1  . methocarbamol (ROBAXIN) 500 MG tablet Take 500 mg by mouth every 4 (four) hours as needed for muscle spasms.    . metoprolol (LOPRESSOR) 100 MG tablet Take 1 tablet (100 mg total) by mouth 2 (two) times daily.    . Multiple Vitamins-Minerals (MULTIVITAMIN WITH MINERALS) tablet Take 1 tablet by mouth daily.    [name]. senna-docusate (SENOKOT-S) 8.6-50 MG tablet Take 2 tablets by mouth 2 (two) times daily. 30 tablet 0  . Tamsulosin HCl (FLOMAX) 0.4 MG CAPS Take 0.4 mg by mouth at bedtime.     [name]. venlafaxine (EFFEXOR) 75 MG tablet Take 1 tablet (75 mg total) by mouth daily with supper. 30 tablet 0  . dicyclomine (BENTYL) 20 MG tablet Take 20 mg by mouth every 6 (six) hours.  10  . oxyCODONE (ROXICODONE) 5 MG immediate release tablet Take 1 tablet (5 mg total) by mouth every 6 (six) hours as needed for severe pain. 20 tablet 0  . QUEtiapine (SEROQUEL) 25 MG tablet Take 1 tablet (25 mg total) by mouth 2 (two) times daily. 60 tablet 0   No current facility-administered medications on file prior to visit.     Allergies  Allergen Reactions  . Doxycycline Swelling    swollen tongue     Family History  Problem Relation Age of Onset  . Heart disease Father   . Stroke Mother   . Rheum arthritis Mother   . Heart disease Paternal Grandfather   . Colon cancer Neg Hx   . Stomach cancer Neg Hx     BP 112/70 (BP Location: Right Arm, Patient Position: Sitting, Cuff Size: Normal)   Pulse 66   SpO2 93%   Review of Systems I asked caretaker from facility.  She says he has not had fever.      Objective:   Physical Exam VITAL SIGNS:  See vs page.   GENERAL: no distress.  NECK: There is no palpable thyroid enlargement.  No thyroid nodule is palpable.  No palpable  lymphadenopathy at the anterior neck.  Lab Results  Component Value Date   TSH 0.43 04/09/2016      Assessment & Plan:  Hyperthyroidism: well-controlled.  Please  continue the same medication.  Please come back for a follow-up appointment in 3 months.

## 2016-04-09 NOTE — Progress Notes (Signed)
Pre visit review using our clinic review tool, if applicable. No additional management support is needed unless otherwise documented below in the visit note. 

## 2016-04-28 ENCOUNTER — Telehealth: Payer: Self-pay | Admitting: Internal Medicine

## 2016-04-28 NOTE — Telephone Encounter (Signed)
See message to be advised.  

## 2016-04-28 NOTE — Telephone Encounter (Signed)
Patient died on Sunday 05/09/2016.

## 2016-05-01 ENCOUNTER — Encounter (HOSPITAL_BASED_OUTPATIENT_CLINIC_OR_DEPARTMENT_OTHER): Payer: Medicare Other

## 2016-05-22 DEATH — deceased

## 2016-07-10 ENCOUNTER — Ambulatory Visit: Payer: Medicare Other | Admitting: Endocrinology

## 2016-09-10 ENCOUNTER — Ambulatory Visit: Payer: Self-pay | Admitting: Pharmacist

## 2016-09-10 DIAGNOSIS — Z5181 Encounter for therapeutic drug level monitoring: Secondary | ICD-10-CM

## 2016-09-10 DIAGNOSIS — I480 Paroxysmal atrial fibrillation: Secondary | ICD-10-CM

## 2016-09-10 DIAGNOSIS — Z952 Presence of prosthetic heart valve: Secondary | ICD-10-CM

## 2017-07-27 IMAGING — DX DG LUMBAR SPINE 2-3V
3 series · 3 of 3 positions shown · non-contrast
Comparison: MRI 10/18/2008

CLINICAL DATA: Low back pain after fall on [REDACTED].

EXAM:
LUMBAR SPINE - 2-3 VIEW

[l-spine ap]
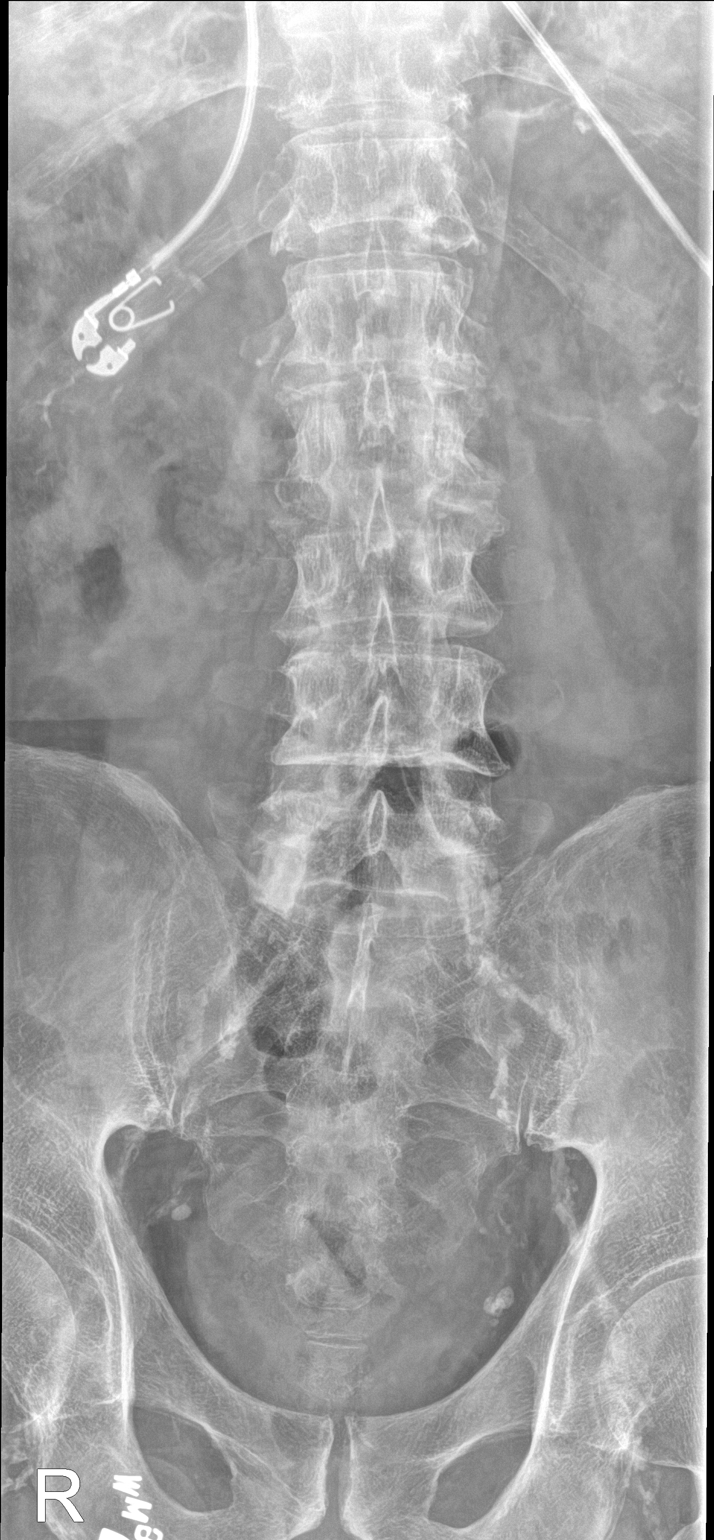

[l-spine lat]
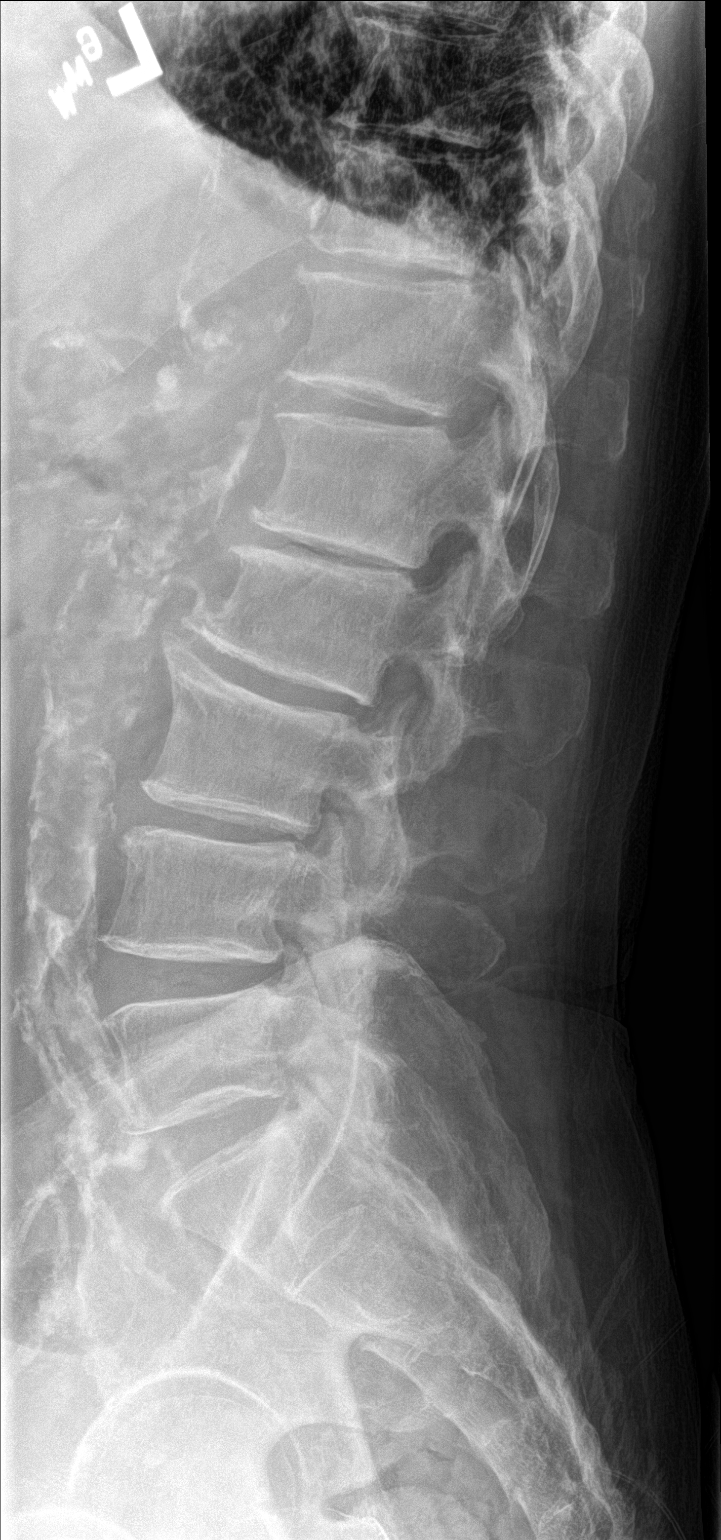

[l-spine spot]
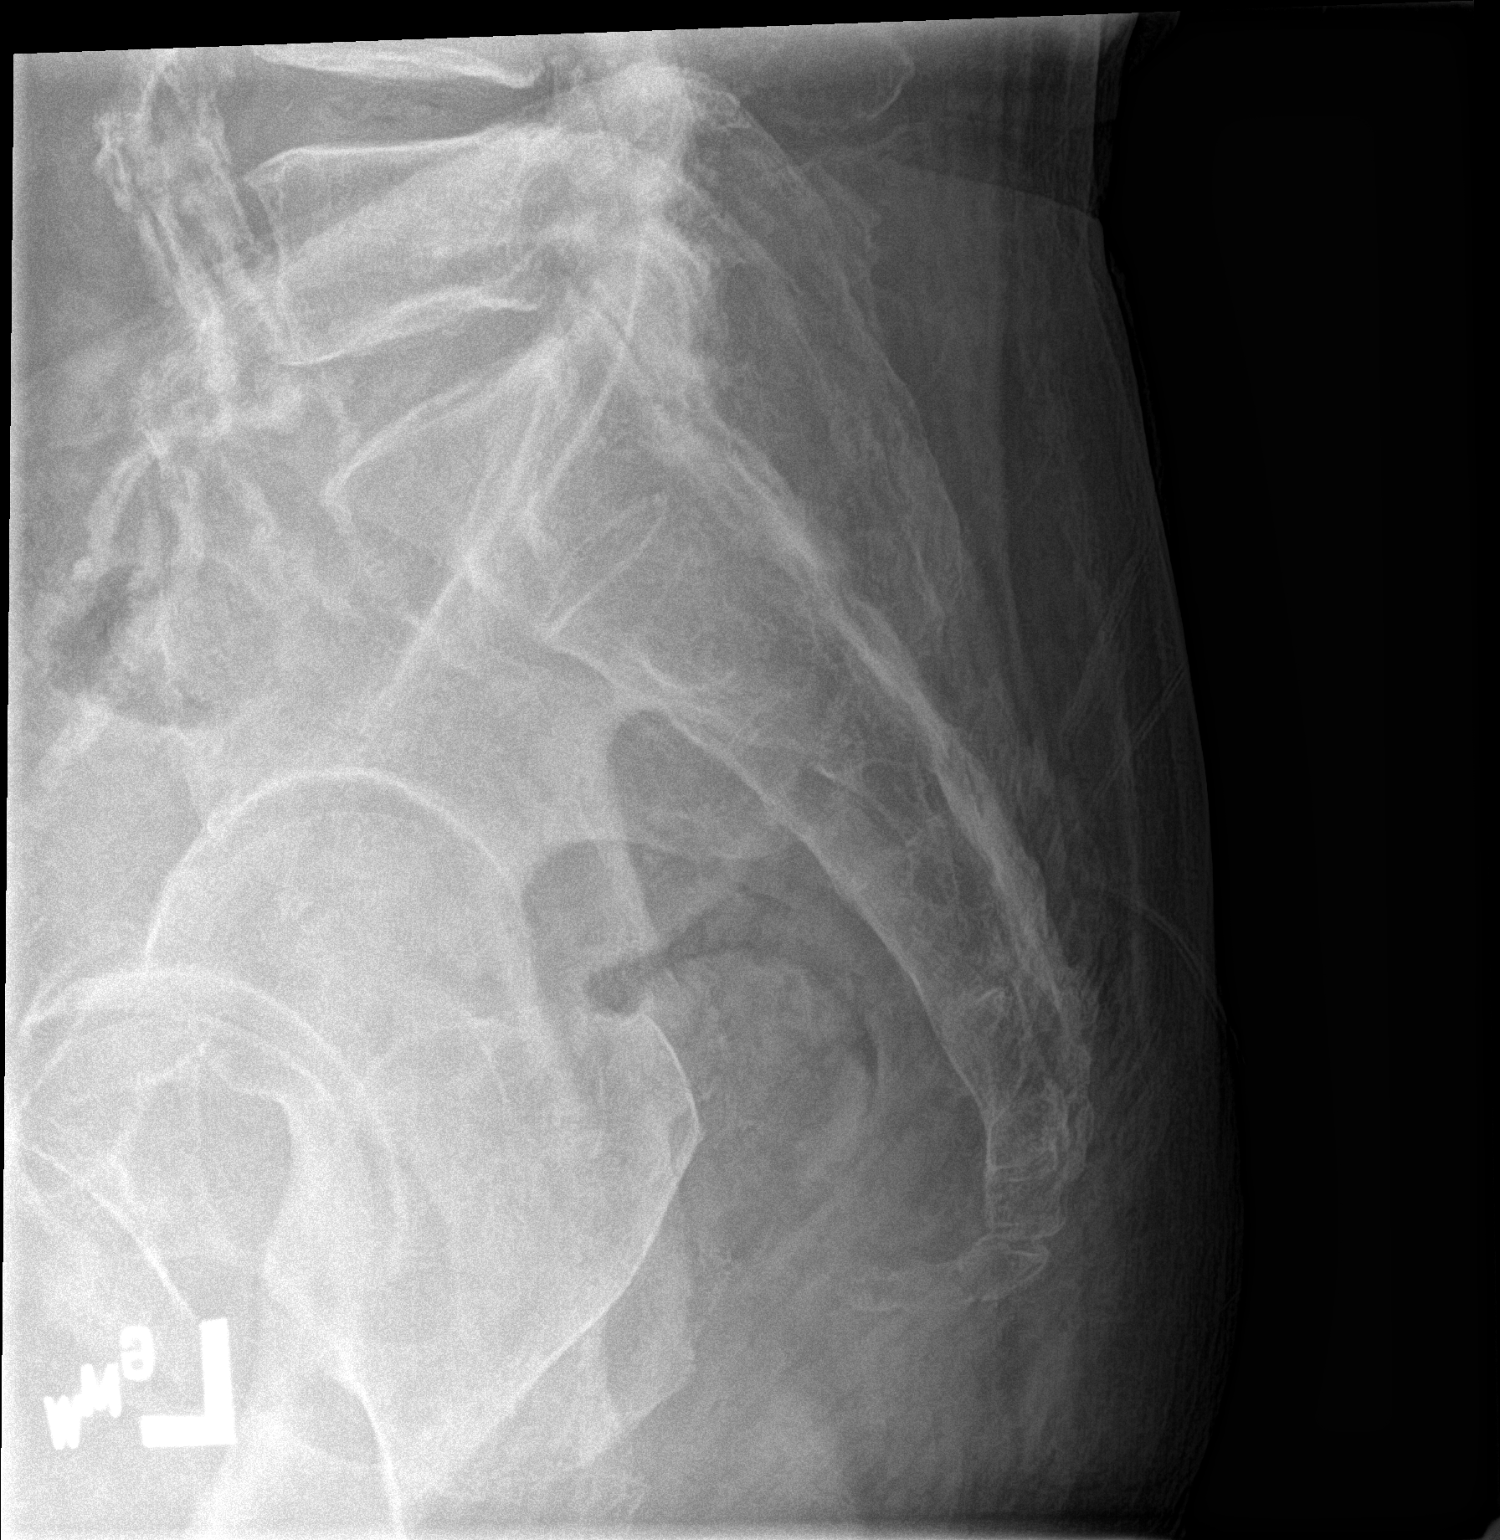

[3 of 3 positions shown; findings below may reference images not displayed]

FINDINGS: Normal alignment of the lumbar spine. Multilevel degenerative
endplate changes. The vertebral body heights are maintained without
a fracture. There is disc space narrowing at L1-L2. Atherosclerotic
calcifications in the abdominal aorta. Again noted is mild
retrolisthesis at L2-L3.
IMPRESSION: No acute abnormality in lumbar spine.

## 2017-07-27 IMAGING — CT CT HEAD W/O CM
5 series · 21 of 47 positions shown, 23 images · non-contrast
Comparison: 08/05/2015

CLINICAL DATA: Increase in weakness over the last 6 days.

EXAM:
CT HEAD WITHOUT CONTRAST
TECHNIQUE: Contiguous axial images were obtained from the base of the skull
through the vertex without intravenous contrast.

[Series 201: head w/o, idose (1) · axial · non-contrast · 0.38mm/px · z∈[+79,+179]mm · 5 of 31 slices shown, 7 images (1 of 2)]
[im 6/31  brain]
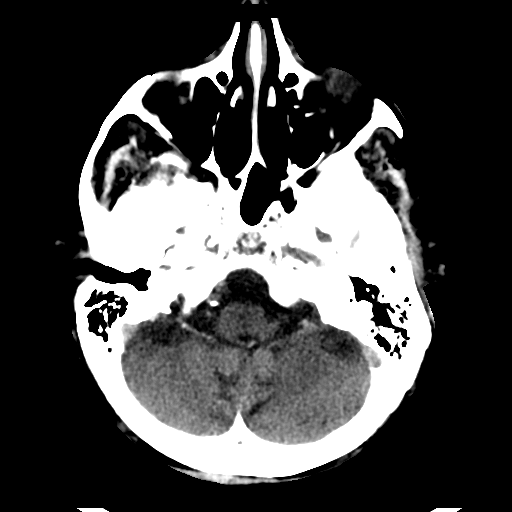
[im 6/31  bone]
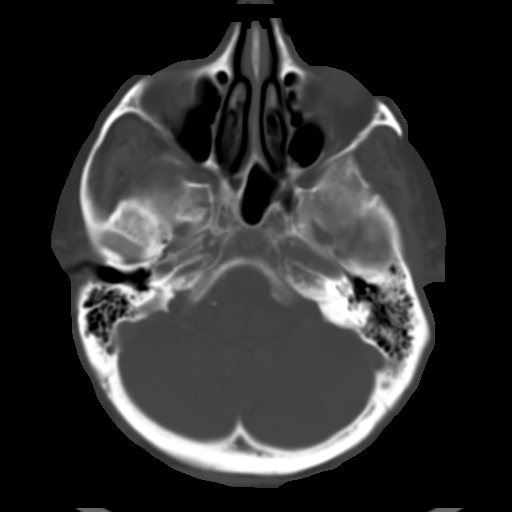
[im 11/31  brain]
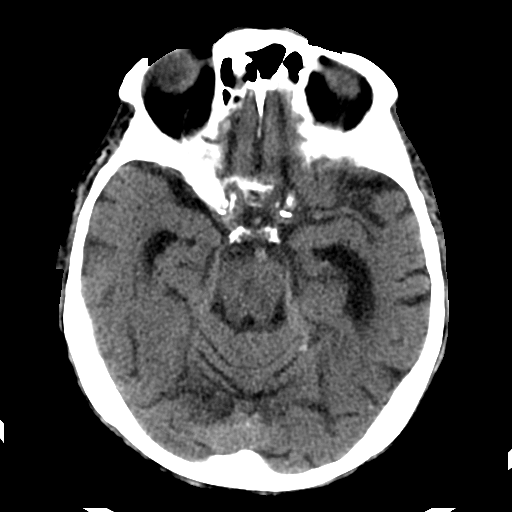
[im 16/31  brain]
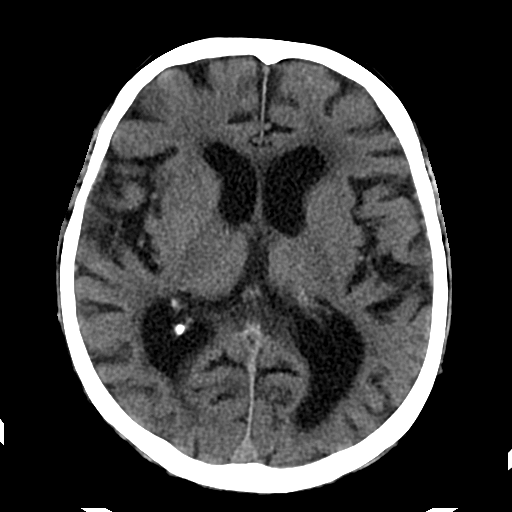
[im 21/31  brain]
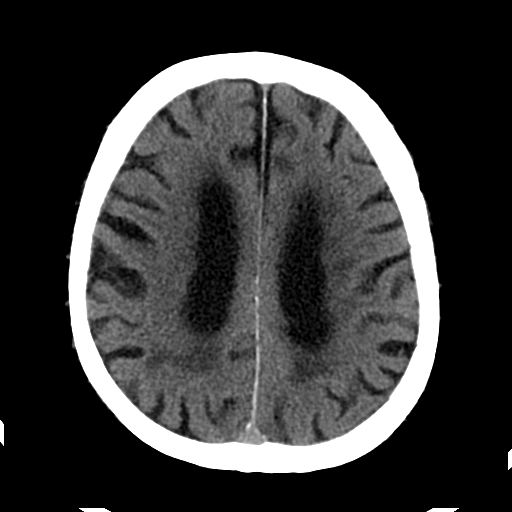
[im 26/31  brain]
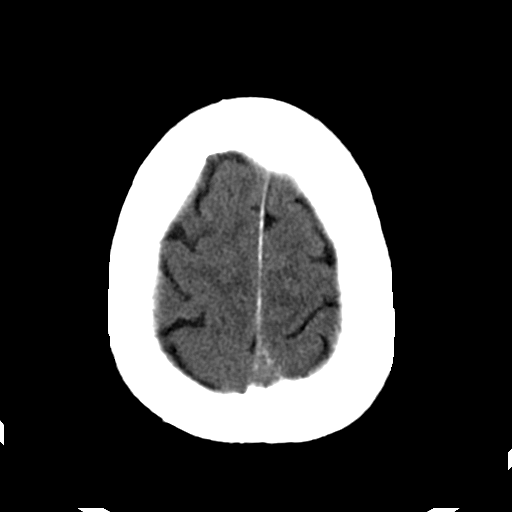
[im 26/31  bone]
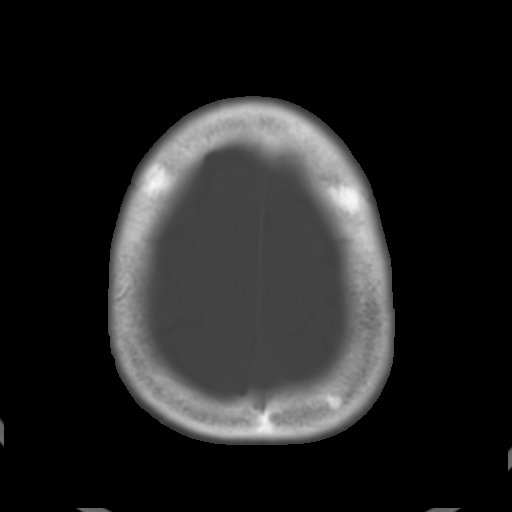

[Series 202: head w/o bone, idose (1) · axial · non-contrast · 0.38mm/px · z∈[+79,+104]mm · 2 of 31 slices shown]
[im 6/31  bone]
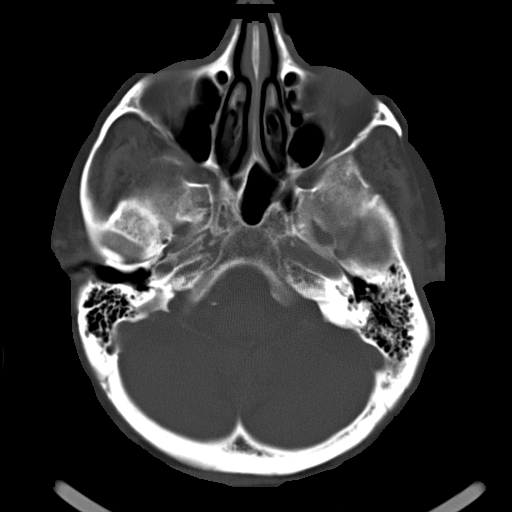
[im 11/31  bone]
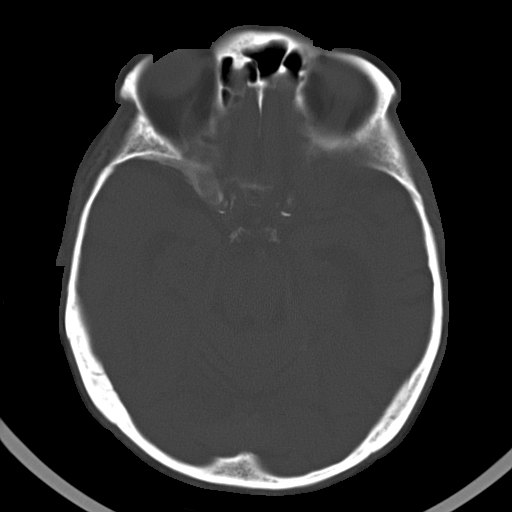

[Series 203: coronal st, idose (1) · coronal · 0.38mm/px · 3 of 65 slices shown]
[im 22/65  brain]
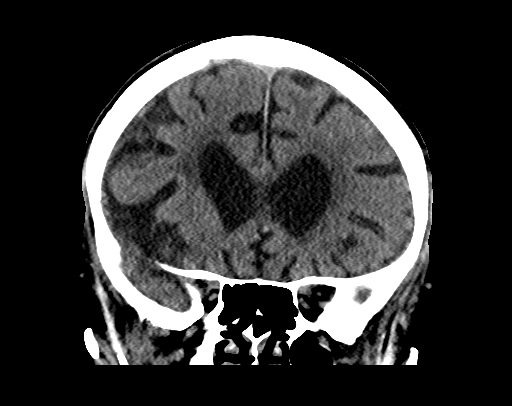
[im 29/65  brain]
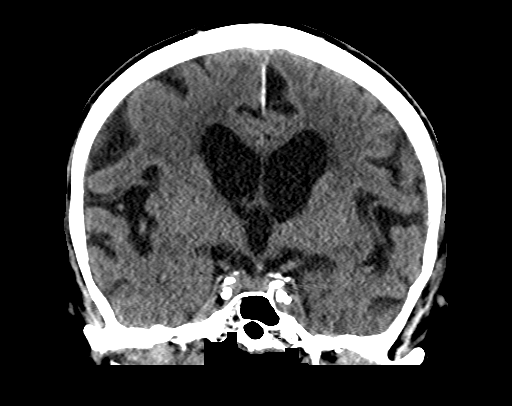
[im 36/65  brain]
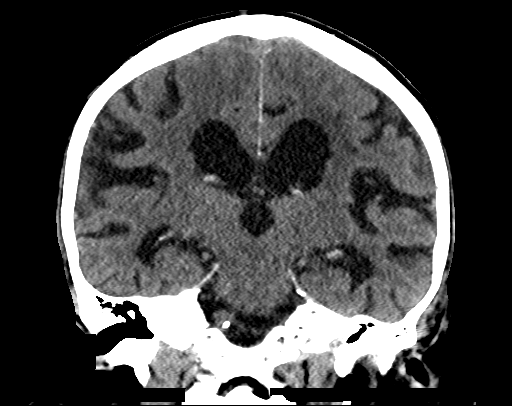

[Series 204: sagittal st, idose (1) · sagittal · 0.38mm/px · 3 of 65 slices shown]
[im 22/65  brain]
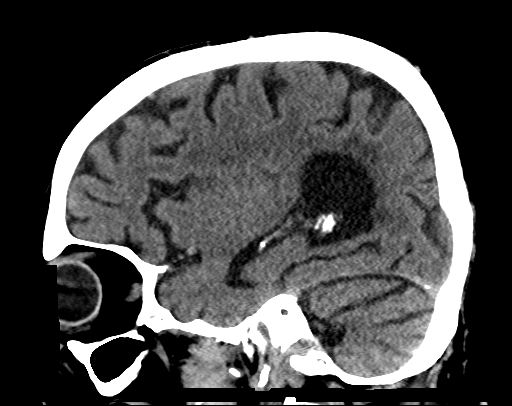
[im 33/65  brain]
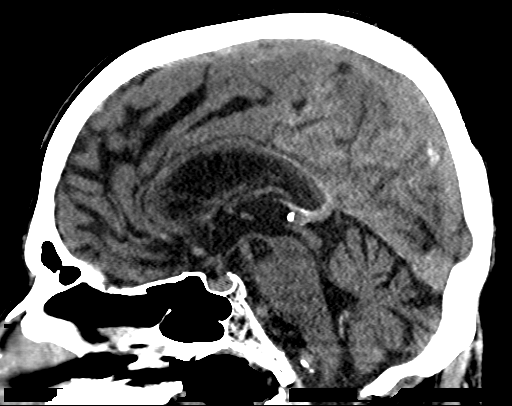
[im 43/65  brain]
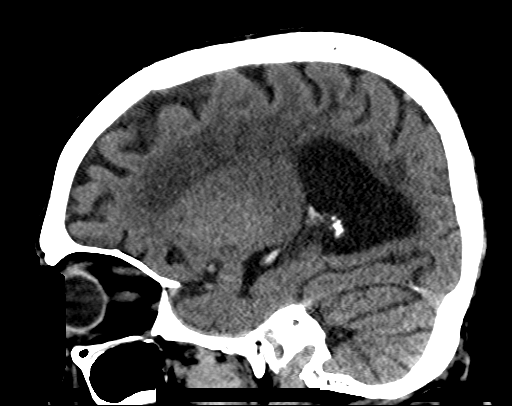

[Series 205: head w/o, idose (1) · axial · non-contrast · 0.46mm/px · z∈[+109,+228]mm · 8 of 73 slices shown (2 of 2)]
[im 5/73  brain]
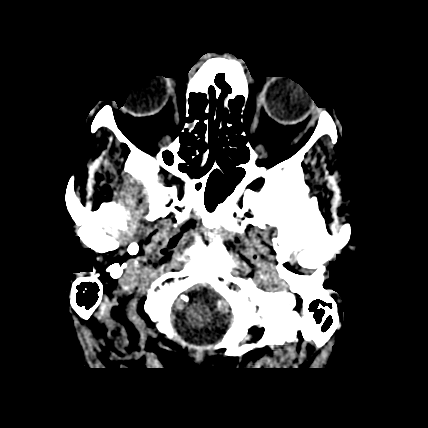
[im 15/73  brain]
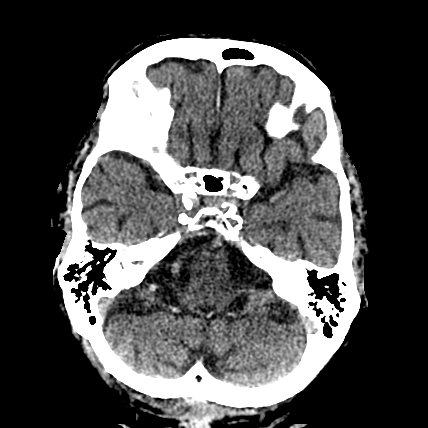
[im 25/73  brain]
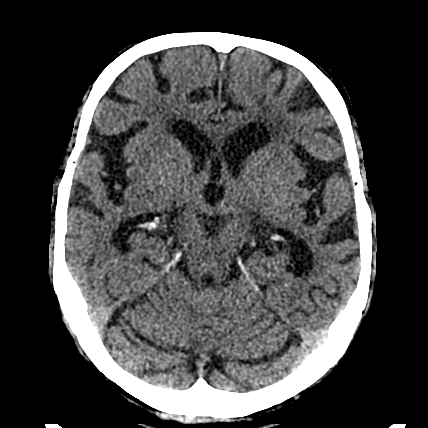
[im 34/73  brain]
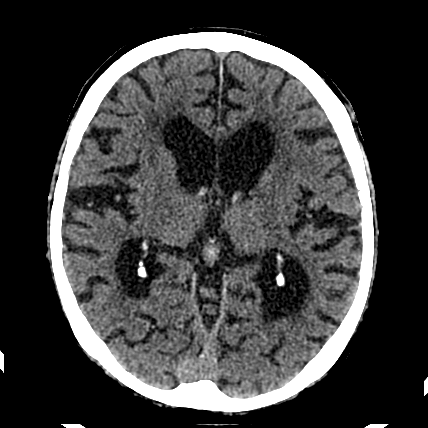
[im 39/73  brain]
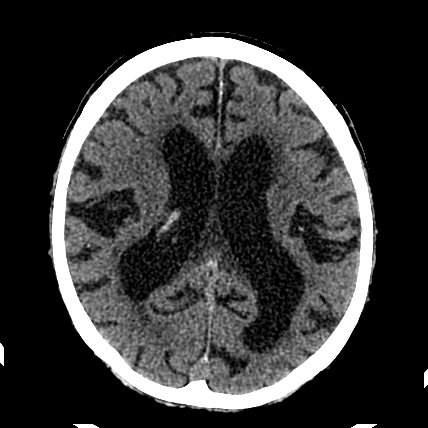
[im 49/73  brain]
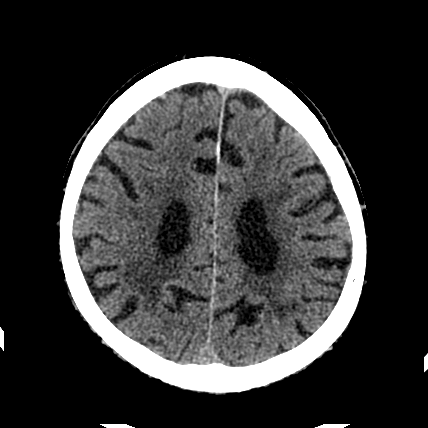
[im 58/73  brain]
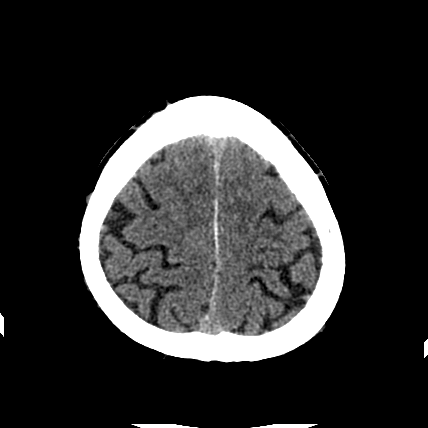
[im 68/73  brain]
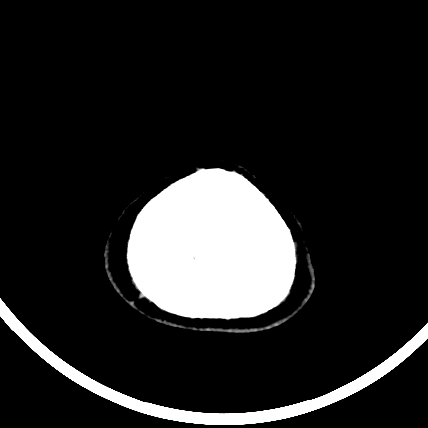

[21 of 47 positions shown; findings below may reference images not displayed]

FINDINGS: Chronic area of low attenuation in the periventricular white matter
of the left parietal lobe is identified compatible with previous
infarct. Mild diffuse low attenuation throughout the subcortical
white matter is also noted compatible with chronic microvascular
disease. No evidence for acute brain infarct, intracranial
hemorrhage or mass. Prominence of the sulci and ventricles
compatible with brain atrophy. The paranasal sinuses and mastoid air
cells are clear. The calvarium is intact.
IMPRESSION: 1. No acute intracranial abnormalities.
2. Chronic microvascular disease and brain atrophy.
